# Patient Record
Sex: Male | Born: 1935 | Race: White | Hispanic: No | Marital: Married | Smoking: Never smoker
Health system: Southern US, Community
[De-identification: ages and names within clinical notes are randomized; demographics above are authoritative.]

## PROBLEM LIST (undated history)

## (undated) DIAGNOSIS — I480 Paroxysmal atrial fibrillation: Secondary | ICD-10-CM

## (undated) DIAGNOSIS — K56609 Unspecified intestinal obstruction, unspecified as to partial versus complete obstruction: Secondary | ICD-10-CM

## (undated) DIAGNOSIS — G5600 Carpal tunnel syndrome, unspecified upper limb: Secondary | ICD-10-CM

## (undated) DIAGNOSIS — K819 Cholecystitis, unspecified: Secondary | ICD-10-CM

## (undated) DIAGNOSIS — T4145XA Adverse effect of unspecified anesthetic, initial encounter: Secondary | ICD-10-CM

## (undated) DIAGNOSIS — I10 Essential (primary) hypertension: Secondary | ICD-10-CM

## (undated) DIAGNOSIS — T8859XA Other complications of anesthesia, initial encounter: Secondary | ICD-10-CM

## (undated) DIAGNOSIS — J45909 Unspecified asthma, uncomplicated: Secondary | ICD-10-CM

## (undated) DIAGNOSIS — M1990 Unspecified osteoarthritis, unspecified site: Secondary | ICD-10-CM

## (undated) HISTORY — PX: HERNIA REPAIR: SHX51

## (undated) HISTORY — DX: Paroxysmal atrial fibrillation: I48.0

---

## 2003-11-18 ENCOUNTER — Ambulatory Visit (HOSPITAL_COMMUNITY): Admission: RE | Admit: 2003-11-18 | Discharge: 2003-11-18 | Payer: Self-pay | Admitting: Gastroenterology

## 2003-11-18 ENCOUNTER — Encounter (INDEPENDENT_AMBULATORY_CARE_PROVIDER_SITE_OTHER): Payer: Self-pay | Admitting: *Deleted

## 2009-06-23 ENCOUNTER — Encounter: Admission: RE | Admit: 2009-06-23 | Discharge: 2009-06-23 | Payer: Self-pay | Admitting: General Surgery

## 2009-06-28 ENCOUNTER — Ambulatory Visit (HOSPITAL_BASED_OUTPATIENT_CLINIC_OR_DEPARTMENT_OTHER): Admission: RE | Admit: 2009-06-28 | Discharge: 2009-06-28 | Payer: Self-pay | Admitting: General Surgery

## 2010-11-23 LAB — BASIC METABOLIC PANEL
BUN: 12 mg/dL (ref 6–23)
CO2: 23 mEq/L (ref 19–32)
Calcium: 8.9 mg/dL (ref 8.4–10.5)
Chloride: 104 mEq/L (ref 96–112)
Creatinine, Ser: 0.84 mg/dL (ref 0.4–1.5)
GFR calc Af Amer: >60 mL/min (ref >60)

## 2011-01-06 NOTE — Op Note (Signed)
NAME:  Daniel Yu, Daniel Yu                          ACCOUNT NO.:  [id number]   MEDICAL RECORD NO.:  [id number]                   PATIENT TYPE:  AMB   LOCATION:  ENDO                                 FACILITY:  [facility]   PHYSICIAN:  Danise Edge, M.D.                DATE OF BIRTH:  01-Dec-1931   DATE OF PROCEDURE:  11/18/2003  DATE OF DISCHARGE:                                 OPERATIVE REPORT   REFERRING PHYSICIAN:  Dr. Kirby Funk.   PROCEDURE:  Colonoscopy.   PROCEDURE INDICATION:  Daniel Yu is a 77 year old make, born February 23, 1932.  Daniel Yu is scheduled to undergo his first screening colonoscopy  with polypectomy to prevent colon cancer.   ENDOSCOPIST:  Charolett Bumpers, M.D.   PREMEDICATION:  1. Versed 9 mg.  2. Demerol 50 mg.   DESCRIPTION OF PROCEDURE:  After obtaining informed consent, Daniel Yu was  placed in the left lateral decubitus position.  I administered intravenous  Demerol and intravenous Versed to achieve conscious sedation for the  procedure.  The patient's blood pressure, oxygen saturation, and cardiac  rhythm were monitored throughout the procedure and documented in the medical  record.   Anal inspection was normal.  Digital rectal exam was normal.  The prostate  was nonnodular.  The Olympus adjustable pediatric colonoscope was introduced  into the rectum and advanced to the cecum.  Colonic preparation for the exam  today was excellent.   RECTUM:  Normal.  SIGMOID COLON AND DESCENDING COLON:  At 30 cm from the anal verge, a 2 mm  sessile polyp was removed with the electrocautery snare.  At 40 cm from the  anal verge, a 2 mm sessile polyp was removed with the electrocautery snare.  SPLENIC FLEXURE:  Normal.  TRANSVERSE COLON:  Normal.  HEPATIC FLEXURE:  Normal.  ASCENDING COLON:  Normal.  CECUM AND ILEOCECAL VALVE:  Normal.   ASSESSMENT:  Two small polyps were removed from the sigmoid colon and  submitted in one bottle for pathological  evaluation.   RECOMMENDATIONS:  Repeat colonoscopy in 5 years if polyps return neoplastic  pathologically.                                               Danise Edge, M.D.    MJ/MEDQ  D:  11/18/2003  T:  11/18/2003  Job:  [id number]   cc:   Thora Lance, M.D.  [address]  [place]  [place] [address]  Fax: [phone]

## 2011-12-12 ENCOUNTER — Other Ambulatory Visit: Payer: Self-pay | Admitting: Dermatology

## 2013-05-27 ENCOUNTER — Other Ambulatory Visit: Payer: Self-pay | Admitting: Internal Medicine

## 2013-05-27 ENCOUNTER — Ambulatory Visit
Admission: RE | Admit: 2013-05-27 | Discharge: 2013-05-27 | Disposition: A | Discharge status: Home / Self Care | Payer: Medicare Other | Source: Ambulatory Visit | Attending: Internal Medicine | Admitting: Internal Medicine

## 2013-05-27 DIAGNOSIS — M25552 Pain in left hip: Secondary | ICD-10-CM

## 2014-08-05 ENCOUNTER — Other Ambulatory Visit: Payer: Self-pay | Admitting: Gastroenterology

## 2014-10-20 ENCOUNTER — Other Ambulatory Visit: Payer: Self-pay | Admitting: Gastroenterology

## 2014-11-05 ENCOUNTER — Encounter (HOSPITAL_COMMUNITY): Payer: Self-pay | Admitting: *Deleted

## 2014-11-09 ENCOUNTER — Ambulatory Visit (HOSPITAL_COMMUNITY)
Admission: RE | Admit: 2014-11-09 | Discharge: 2014-11-09 | Disposition: A | Discharge status: Home / Self Care | Payer: 59 | Source: Ambulatory Visit | Attending: Gastroenterology | Admitting: Gastroenterology

## 2014-11-09 ENCOUNTER — Encounter (HOSPITAL_COMMUNITY): Payer: Self-pay | Admitting: *Deleted

## 2014-11-09 ENCOUNTER — Ambulatory Visit (HOSPITAL_COMMUNITY): Payer: 59 | Admitting: Anesthesiology

## 2014-11-09 ENCOUNTER — Encounter (HOSPITAL_COMMUNITY)
Admission: RE | Disposition: A | Discharge status: Home / Self Care | Payer: Self-pay | Source: Ambulatory Visit | Attending: Gastroenterology

## 2014-11-09 DIAGNOSIS — Z09 Encounter for follow-up examination after completed treatment for conditions other than malignant neoplasm: Secondary | ICD-10-CM | POA: Diagnosis not present

## 2014-11-09 DIAGNOSIS — M1990 Unspecified osteoarthritis, unspecified site: Secondary | ICD-10-CM | POA: Insufficient documentation

## 2014-11-09 DIAGNOSIS — J45909 Unspecified asthma, uncomplicated: Secondary | ICD-10-CM | POA: Diagnosis not present

## 2014-11-09 DIAGNOSIS — I10 Essential (primary) hypertension: Secondary | ICD-10-CM | POA: Insufficient documentation

## 2014-11-09 DIAGNOSIS — Z91030 Bee allergy status: Secondary | ICD-10-CM | POA: Insufficient documentation

## 2014-11-09 HISTORY — PX: COLONOSCOPY WITH PROPOFOL: SHX5780

## 2014-11-09 HISTORY — DX: Essential (primary) hypertension: I10

## 2014-11-09 SURGERY — COLONOSCOPY WITH PROPOFOL
Anesthesia: Monitor Anesthesia Care

## 2014-11-09 MED ORDER — PROPOFOL 10 MG/ML IV BOLUS
INTRAVENOUS | Status: AC
  Filled 2014-11-09: qty 20

## 2014-11-09 MED ORDER — PROPOFOL INFUSION 10 MG/ML OPTIME
INTRAVENOUS | Status: DC | PRN
  Administered 2014-11-09: 180 ug/kg/min via INTRAVENOUS

## 2014-11-09 MED ORDER — SODIUM CHLORIDE 0.9 % IV SOLN: INTRAVENOUS | Status: DC

## 2014-11-09 MED ORDER — LACTATED RINGERS IV SOLN
INTRAVENOUS | Status: DC
  Administered 2014-11-09: 09:00:00 via INTRAVENOUS

## 2014-11-09 SURGICAL SUPPLY — 21 items

## 2014-11-09 NOTE — Anesthesia Preprocedure Evaluation (Signed)
Anesthesia Evaluation  Patient identified by MRN, date of birth, ID band Patient awake    Reviewed: Allergy & Precautions, NPO status , Patient's Chart, lab work & pertinent test results  Airway Mallampati: I  TM Distance: >3 FB Neck ROM: Full    Dental   Pulmonary          Cardiovascular hypertension, Pt. on medications     Neuro/Psych    GI/Hepatic   Endo/Other    Renal/GU      Musculoskeletal   Abdominal   Peds  Hematology   Anesthesia Other Findings   Reproductive/Obstetrics                             Anesthesia Physical Anesthesia Plan  ASA: II  Anesthesia Plan: MAC   Post-op Pain Management:    Induction: Intravenous  Airway Management Planned: Natural Airway  Additional Equipment:   Intra-op Plan:   Post-operative Plan:   Informed Consent: I have reviewed the patients History and Physical, chart, labs and discussed the procedure including the risks, benefits and alternatives for the proposed anesthesia with the patient or authorized representative who has indicated his/her understanding and acceptance.     Plan Discussed with: CRNA and Surgeon  Anesthesia Plan Comments:         Anesthesia Quick Evaluation

## 2014-11-09 NOTE — Anesthesia Postprocedure Evaluation (Signed)
Anesthesia Post Note  Patient: Daniel Yu  Procedure(s) Performed: Procedure(s) (LRB): COLONOSCOPY WITH PROPOFOL (N/A)  Anesthesia type: general  Patient location: PACU  Post pain: Pain level controlled  Post assessment: Patient's Cardiovascular Status Stable  Last Vitals:  Filed Vitals:   11/09/14 1040  BP: 125/74  Pulse: 65  Temp: 36.4 C  Resp: 18    Post vital signs: Reviewed and stable  Level of consciousness: sedated  Complications: No apparent anesthesia complications

## 2014-11-09 NOTE — Op Note (Signed)
Procedure: Surveillance colonoscopy. 01/05/2009 colonoscopy with removal of a 3 mm adenomatous ascending colon polyp  Endoscopist: Earle Gell  Premedication: Propofol administered by anesthesia  Procedure: The patient was placed in the left lateral decubitus position. Anal inspection and digital rectal exam were normal. The Pentax pediatric colonoscope was introduced into the rectum and advanced to the cecum. A normal-appearing appendiceal orifice was identified. A normal-appearing ileocecal valve was intubated and the terminal ileum inspected. Colonic preparation for the exam today was good  Rectum. Normal. Retroflexed view of the distal rectum normal  Sigmoid colon and descending colon. Normal  Splenic flexure. Normal  Transverse colon. Normal  Hepatic flexure. Normal  Ascending colon. Normal  Cecum and ileocecal valve. Normal  Terminal ileum. Normal  Assessment: Normal surveillance colonoscopy

## 2014-11-09 NOTE — Transfer of Care (Signed)
Immediate Anesthesia Transfer of Care Note  Patient: Daniel Yu  Procedure(s) Performed: Procedure(s): COLONOSCOPY WITH PROPOFOL (N/A)  Patient Location: PACU  Anesthesia Type:MAC  Level of Consciousness: sedated  Airway & Oxygen Therapy: Patient Spontanous Breathing and Patient connected to nasal cannula oxygen  Post-op Assessment: Report given to RN and Post -op Vital signs reviewed and stable  Post vital signs: Reviewed and stable  Last Vitals:  Filed Vitals:   11/09/14 0840  BP: 143/77  Pulse: 81  Temp: 36.4 C  Resp: 16    Complications: No apparent anesthesia complications

## 2014-11-09 NOTE — Discharge Instructions (Signed)
Colonoscopy, Care After °These instructions give you information on caring for yourself after your procedure. Your doctor may also give you more specific instructions. Call your doctor if you have any problems or questions after your procedure. °HOME CARE °· Do not drive for 24 hours. °· Do not sign important papers or use machinery for 24 hours. °· You may shower. °· You may go back to your usual activities, but go slower for the first 24 hours. °· Take rest breaks often during the first 24 hours. °· Walk around or use warm packs on your belly (abdomen) if you have belly cramping or gas. °· Drink enough fluids to keep your pee (urine) clear or pale yellow. °· Resume your normal diet. Avoid heavy or fried foods. °· Avoid drinking alcohol for 24 hours or as told by your doctor. °· Only take medicines as told by your doctor. °If a tissue sample (biopsy) was taken during the procedure:  °· Do not take aspirin or blood thinners for 7 days, or as told by your doctor. °· Do not drink alcohol for 7 days, or as told by your doctor. °· Eat soft foods for the first 24 hours. °GET HELP IF: °You still have a small amount of blood in your poop (stool) 2-3 days after the procedure. °GET HELP RIGHT AWAY IF: °· You have more than a small amount of blood in your poop. °· You see clumps of tissue (blood clots) in your poop. °· Your belly is puffy (swollen). °· You feel sick to your stomach (nauseous) or throw up (vomit). °· You have a fever. °· You have belly pain that gets worse and medicine does not help. °MAKE SURE YOU: °· Understand these instructions. °· Will watch your condition. °· Will get help right away if you are not doing well or get worse. °Document Released: 09/09/2010 Document Revised: 08/12/2013 Document Reviewed: 04/14/2013 °ExitCare® Patient Information ©2015 [employer]. This information is not intended to replace advice given to you by your health care provider. Make sure you discuss any questions you have with  your health care provider. ° °

## 2014-11-09 NOTE — H&P (Signed)
  Procedure: Surveillance colonoscopy. Colonoscopy with removal of a 3 mm adenomatous ascending colon polyp performed on 01/05/2009  History: The patient is an 81 year old male born 1931-09-19. He is scheduled to undergo a surveillance colonoscopy today.  Past medical history: Hypertension. Asthma. Osteoarthritis of the right knee. Left inguinal hernia repair.  Allergies: Bee sting. Lisinopril caused cough  Exam: The patient is alert and lying comfortably on the endoscopy stretcher. Abdomen is soft and nontender to palpation. Lungs are clear to auscultation. Cardiac exam reveals a regular rhythm.  Plan: Proceed with surveillance colonoscopy

## 2014-11-10 ENCOUNTER — Encounter (HOSPITAL_COMMUNITY): Payer: Self-pay | Admitting: Gastroenterology

## 2015-06-01 ENCOUNTER — Ambulatory Visit: Payer: Self-pay | Admitting: Orthopedic Surgery

## 2015-06-01 NOTE — Progress Notes (Signed)
Needs orders in epic for 10-17 surgery pre op is 10-14 830 thanks

## 2015-06-01 NOTE — Progress Notes (Signed)
Preoperative surgical orders have been place into the Epic hospital system for Daniel Yu on 06/01/2015, 2:26 PM  by Mickel Crow for surgery on 06-07-2015.  Preop Knee orders including IV Tylenol and IV Decadron as long as there are no contraindications to the above medications. Arlee Muslim, PA-C

## 2015-06-01 NOTE — Patient Instructions (Addendum)
Daniel Yu [facility]  06/01/2015   Your procedure is scheduled on: Monday 06/07/2015  Report to [facility] Main  Entrance take [facility]  elevators to 3rd floor to  [place] at  110 PM.  Call this number if you have problems the morning of surgery [phone]   Remember: ONLY 1 PERSON MAY GO WITH YOU TO SHORT STAY TO GET  READY MORNING OF [place].  Do not eat food after Midnight.clear liquids midnight until 1010 am, then nothing by mouth.      Take these medicines the morning of surgery with A SIP OF WATER:  flovent inhaler  DO NOT TAKE ANY DIABETIC MEDICATIONS DAY OF YOUR SURGERY                               You may not have any metal on your body including hair pins and              piercings  Do not wear jewelry, make-up, lotions, powders or perfumes, deodorant             Do not wear nail polish.  Do not shave  48 hours prior to surgery.              Men may shave face and neck.   Do not bring valuables to the hospital. [place].  Contacts, dentures or bridgework may not be worn into surgery.  Leave suitcase in the car. After surgery it may be brought to your room.     Patients discharged the day of surgery will not be allowed to drive home.  Name and phone number of your driver:  Special Instructions: N/A              Please read over the following fact sheets you were given: _____________________________________________________________________             [facility] - Preparing for Surgery Before surgery, you can play an important role.  Because skin is not sterile, your skin needs to be as free of germs as possible.  You can reduce the number of germs on your skin by washing with CHG (chlorahexidine gluconate) soap before surgery.  CHG is an antiseptic cleaner which kills germs and bonds with the skin to continue killing germs even after washing. Please DO NOT use if you have an allergy to  CHG or antibacterial soaps.  If your skin becomes reddened/irritated stop using the CHG and inform your nurse when you arrive at Short Stay. Do not shave (including legs and underarms) for at least 48 hours prior to the first CHG shower.  You may shave your face/neck. Please follow these instructions carefully:  1.  Shower with CHG Soap the night before surgery and the  morning of Surgery.  2.  If you choose to wash your hair, wash your hair first as usual with your  normal  shampoo.  3.  After you shampoo, rinse your hair and body thoroughly to remove the  shampoo.                           4.  Use CHG as you would any other liquid soap.  You  can apply chg directly  to the skin and wash                       Gently with a scrungie or clean washcloth.  5.  Apply the CHG Soap to your body ONLY FROM THE NECK DOWN.   Do not use on face/ open                           Wound or open sores. Avoid contact with eyes, ears mouth and genitals (private parts).                       Wash face,  Genitals (private parts) with your normal soap.             6.  Wash thoroughly, paying special attention to the area where your surgery  will be performed.  7.  Thoroughly rinse your body with warm water from the neck down.  8.  DO NOT shower/wash with your normal soap after using and rinsing off  the CHG Soap.                9.  Pat yourself dry with a clean towel.            10.  Wear clean pajamas.            11.  Place clean sheets on your bed the night of your first shower and do not  sleep with pets. Day of Surgery : Do not apply any lotions/deodorants the morning of surgery.  Please wear clean clothes to the hospital/surgery center.  FAILURE TO FOLLOW THESE INSTRUCTIONS MAY RESULT IN THE CANCELLATION OF YOUR SURGERY PATIENT SIGNATURE_________________________________  NURSE SIGNATURE__________________________________  ________________________________________________________________________   Daniel Yu  An incentive spirometer is a tool that can help keep your lungs clear and active. This tool measures how well you are filling your lungs with each breath. Taking long deep breaths may help reverse or decrease the chance of developing breathing (pulmonary) problems (especially infection) following:  A long period of time when you are unable to move or be active. BEFORE THE PROCEDURE   If the spirometer includes an indicator to show your best effort, your nurse or respiratory therapist will set it to a desired goal.  If possible, sit up straight or lean slightly forward. Try not to slouch.  Hold the incentive spirometer in an upright position. INSTRUCTIONS FOR USE  1. Sit on the edge of your bed if possible, or sit up as far as you can in bed or on a chair. 2. Hold the incentive spirometer in an upright position. 3. Breathe out normally. 4. Place the mouthpiece in your mouth and seal your lips tightly around it. 5. Breathe in slowly and as deeply as possible, raising the piston or the ball toward the top of the column. 6. Hold your breath for 3-5 seconds or for as long as possible. Allow the piston or ball to fall to the bottom of the column. 7. Remove the mouthpiece from your mouth and breathe out normally. 8. Rest for a few seconds and repeat Steps 1 through 7 at least 10 times every 1-2 hours when you are awake. Take your time and take a few normal breaths between deep breaths. 9. The spirometer may include an indicator to show your best effort. Use the indicator as a goal to work toward during  each repetition. 10. After each set of 10 deep breaths, practice coughing to be sure your lungs are clear. If you have an incision (the cut made at the time of surgery), support your incision when coughing by placing a pillow or rolled up towels firmly against it. Once you are able to get out of bed, walk around indoors and cough well. You may stop using the incentive spirometer when  instructed by your caregiver.  RISKS AND COMPLICATIONS  Take your time so you do not get dizzy or light-headed.  If you are in pain, you may need to take or ask for pain medication before doing incentive spirometry. It is harder to take a deep breath if you are having pain. AFTER USE  Rest and breathe slowly and easily.  It can be helpful to keep track of a log of your progress. Your caregiver can provide you with a simple table to help with this. If you are using the spirometer at home, follow these instructions: [place] IF:   You are having difficultly using the spirometer.  You have trouble using the spirometer as often as instructed.  Your pain medication is not giving enough relief while using the spirometer.  You develop fever of 100.5 F (38.1 C) or higher. SEEK IMMEDIATE MEDICAL CARE IF:   You cough up bloody sputum that had not been present before.  You develop fever of 102 F (38.9 C) or greater.  You develop worsening pain at or near the incision site. MAKE SURE YOU:   Understand these instructions.  Will watch your condition.  Will get help right away if you are not doing well or get worse. Document Released: 12/18/2006 Document Revised: 10/30/2011 Document Reviewed: 02/18/2007 [facility] Patient Information 2014 [place], [place].   ________________________________________________________________________

## 2015-06-04 ENCOUNTER — Encounter (HOSPITAL_COMMUNITY): Payer: Self-pay

## 2015-06-04 ENCOUNTER — Encounter (HOSPITAL_COMMUNITY)
Admission: RE | Admit: 2015-06-04 | Discharge: 2015-06-04 | Disposition: A | Discharge status: Home / Self Care | Payer: Medicare Other | Source: Ambulatory Visit | Attending: Orthopedic Surgery | Admitting: Orthopedic Surgery

## 2015-06-04 DIAGNOSIS — Z7951 Long term (current) use of inhaled steroids: Secondary | ICD-10-CM | POA: Diagnosis not present

## 2015-06-04 DIAGNOSIS — S76112A Strain of left quadriceps muscle, fascia and tendon, initial encounter: Secondary | ICD-10-CM | POA: Diagnosis not present

## 2015-06-04 DIAGNOSIS — M179 Osteoarthritis of knee, unspecified: Secondary | ICD-10-CM | POA: Diagnosis not present

## 2015-06-04 DIAGNOSIS — W010XXA Fall on same level from slipping, tripping and stumbling without subsequent striking against object, initial encounter: Secondary | ICD-10-CM | POA: Diagnosis not present

## 2015-06-04 DIAGNOSIS — M25562 Pain in left knee: Secondary | ICD-10-CM | POA: Diagnosis present

## 2015-06-04 DIAGNOSIS — Z79899 Other long term (current) drug therapy: Secondary | ICD-10-CM | POA: Diagnosis not present

## 2015-06-04 DIAGNOSIS — I10 Essential (primary) hypertension: Secondary | ICD-10-CM | POA: Diagnosis not present

## 2015-06-04 DIAGNOSIS — J45909 Unspecified asthma, uncomplicated: Secondary | ICD-10-CM | POA: Diagnosis not present

## 2015-06-04 HISTORY — DX: Unspecified asthma, uncomplicated: J45.909

## 2015-06-04 HISTORY — DX: Unspecified osteoarthritis, unspecified site: M19.90

## 2015-06-04 LAB — CBC
HEMATOCRIT: 43.9 % (ref 39.0–52.0)
Hemoglobin: 15.3 g/dL (ref 13.0–17.0)
MCH: 33.8 pg (ref 26.0–34.0)
MCHC: 34.9 g/dL (ref 30.0–36.0)
MCV: 97.1 fL (ref 78.0–100.0)
Platelets: 342 10*3/uL (ref 150–400)
RBC: 4.52 MIL/uL (ref 4.22–5.81)
RDW: 12.6 % (ref 11.5–15.5)
WBC: 7.8 10*3/uL (ref 4.0–10.5)

## 2015-06-04 LAB — BASIC METABOLIC PANEL
Anion gap: 10 (ref 5–15)
BUN: 14 mg/dL (ref 6–20)
CALCIUM: 8.8 mg/dL — AB (ref 8.9–10.3)
CO2: 24 mmol/L (ref 22–32)
CREATININE: 0.63 mg/dL (ref 0.61–1.24)
Chloride: 98 mmol/L — ABNORMAL LOW (ref 101–111)
GFR calc Af Amer: >60 mL/min (ref >60)
GFR calc non Af Amer: >60 mL/min (ref >60)
GLUCOSE: 105 mg/dL — AB (ref 65–99)
Potassium: 4.3 mmol/L (ref 3.5–5.1)
Sodium: 132 mmol/L — ABNORMAL LOW (ref 135–145)

## 2015-06-07 ENCOUNTER — Observation Stay (HOSPITAL_COMMUNITY)
Admission: RE | Admit: 2015-06-07 | Discharge: 2015-06-08 | Disposition: A | Discharge status: Home / Self Care | Payer: Medicare Other | Source: Ambulatory Visit | Attending: Orthopedic Surgery | Admitting: Orthopedic Surgery

## 2015-06-07 ENCOUNTER — Encounter (HOSPITAL_COMMUNITY): Payer: Self-pay | Admitting: *Deleted

## 2015-06-07 ENCOUNTER — Ambulatory Visit (HOSPITAL_COMMUNITY): Payer: Medicare Other | Admitting: Anesthesiology

## 2015-06-07 ENCOUNTER — Encounter (HOSPITAL_COMMUNITY)
Admission: RE | Disposition: A | Discharge status: Home / Self Care | Payer: Self-pay | Source: Ambulatory Visit | Attending: Orthopedic Surgery

## 2015-06-07 DIAGNOSIS — Z7951 Long term (current) use of inhaled steroids: Secondary | ICD-10-CM | POA: Insufficient documentation

## 2015-06-07 DIAGNOSIS — S76112A Strain of left quadriceps muscle, fascia and tendon, initial encounter: Secondary | ICD-10-CM | POA: Diagnosis not present

## 2015-06-07 DIAGNOSIS — S76119A Strain of unspecified quadriceps muscle, fascia and tendon, initial encounter: Secondary | ICD-10-CM | POA: Diagnosis present

## 2015-06-07 DIAGNOSIS — W010XXA Fall on same level from slipping, tripping and stumbling without subsequent striking against object, initial encounter: Secondary | ICD-10-CM | POA: Insufficient documentation

## 2015-06-07 DIAGNOSIS — I10 Essential (primary) hypertension: Secondary | ICD-10-CM | POA: Insufficient documentation

## 2015-06-07 DIAGNOSIS — M179 Osteoarthritis of knee, unspecified: Secondary | ICD-10-CM | POA: Diagnosis not present

## 2015-06-07 DIAGNOSIS — J45909 Unspecified asthma, uncomplicated: Secondary | ICD-10-CM | POA: Insufficient documentation

## 2015-06-07 DIAGNOSIS — Z79899 Other long term (current) drug therapy: Secondary | ICD-10-CM | POA: Insufficient documentation

## 2015-06-07 HISTORY — PX: QUADRICEPS TENDON REPAIR: SHX756

## 2015-06-07 SURGERY — REPAIR, TENDON, QUADRICEPS
Anesthesia: General | Site: Knee | Laterality: Left

## 2015-06-07 MED ORDER — CEFAZOLIN SODIUM-DEXTROSE 2-3 GM-% IV SOLR
2.0000 g | INTRAVENOUS | Status: AC
  Administered 2015-06-07: 2 g via INTRAVENOUS

## 2015-06-07 MED ORDER — BUPIVACAINE HCL (PF) 0.25 % IJ SOLN
INTRAMUSCULAR | Status: AC
  Filled 2015-06-07: qty 30

## 2015-06-07 MED ORDER — ACETAMINOPHEN 325 MG PO TABS: 650.0000 mg | ORAL_TABLET | Freq: Four times a day (QID) | ORAL | Status: DC | PRN

## 2015-06-07 MED ORDER — BUDESONIDE 0.25 MG/2ML IN SUSP
0.2500 mg | Freq: Two times a day (BID) | RESPIRATORY_TRACT | Status: DC
  Administered 2015-06-08: 0.25 mg via RESPIRATORY_TRACT
  Filled 2015-06-07: qty 2

## 2015-06-07 MED ORDER — PROPOFOL 10 MG/ML IV BOLUS
INTRAVENOUS | Status: AC
  Filled 2015-06-07: qty 20

## 2015-06-07 MED ORDER — TRAMADOL HCL 50 MG PO TABS: 50.0000 mg | ORAL_TABLET | Freq: Four times a day (QID) | ORAL | Status: DC | PRN

## 2015-06-07 MED ORDER — ONDANSETRON HCL 4 MG/2ML IJ SOLN: 4.0000 mg | Freq: Four times a day (QID) | INTRAMUSCULAR | Status: DC | PRN

## 2015-06-07 MED ORDER — BUPIVACAINE HCL (PF) 0.25 % IJ SOLN
INTRAMUSCULAR | Status: DC | PRN
Start: 2015-06-07 — End: 2015-06-07
  Administered 2015-06-07: 30 mL

## 2015-06-07 MED ORDER — CEFAZOLIN SODIUM-DEXTROSE 2-3 GM-% IV SOLR
2.0000 g | Freq: Four times a day (QID) | INTRAVENOUS | Status: AC
  Administered 2015-06-07 – 2015-06-08 (×3): 2 g via INTRAVENOUS
  Filled 2015-06-07 (×3): qty 50

## 2015-06-07 MED ORDER — LIDOCAINE HCL (CARDIAC) 10 MG/ML IV SOLN
INTRAVENOUS | Status: DC | PRN
  Administered 2015-06-07: 80 mg via INTRAVENOUS

## 2015-06-07 MED ORDER — HYDROCHLOROTHIAZIDE 12.5 MG PO CAPS
12.5000 mg | ORAL_CAPSULE | Freq: Every day | ORAL | Status: DC
  Filled 2015-06-07: qty 1

## 2015-06-07 MED ORDER — ACETAMINOPHEN 500 MG PO TABS
1000.0000 mg | ORAL_TABLET | Freq: Four times a day (QID) | ORAL | Status: DC
  Administered 2015-06-07 – 2015-06-08 (×2): 1000 mg via ORAL
  Filled 2015-06-07 (×4): qty 2

## 2015-06-07 MED ORDER — MORPHINE SULFATE (PF) 2 MG/ML IV SOLN: 1.0000 mg | INTRAVENOUS | Status: DC | PRN

## 2015-06-07 MED ORDER — PROPOFOL 10 MG/ML IV BOLUS
INTRAVENOUS | Status: DC | PRN
  Administered 2015-06-07: 50 mg via INTRAVENOUS
  Administered 2015-06-07: 200 mg via INTRAVENOUS
  Administered 2015-06-07: 50 mg via INTRAVENOUS

## 2015-06-07 MED ORDER — SODIUM CHLORIDE 0.9 % IV SOLN: INTRAVENOUS | Status: DC

## 2015-06-07 MED ORDER — LACTATED RINGERS IV SOLN
INTRAVENOUS | Status: DC
  Administered 2015-06-07: 1000 mL via INTRAVENOUS
  Administered 2015-06-07: 18:00:00 via INTRAVENOUS

## 2015-06-07 MED ORDER — SODIUM CHLORIDE 0.9 % IJ SOLN
INTRAMUSCULAR | Status: AC
  Filled 2015-06-07: qty 10

## 2015-06-07 MED ORDER — FENTANYL CITRATE (PF) 250 MCG/5ML IJ SOLN
INTRAMUSCULAR | Status: AC
  Filled 2015-06-07: qty 5

## 2015-06-07 MED ORDER — OXYCODONE HCL 5 MG PO TABS
5.0000 mg | ORAL_TABLET | ORAL | Status: DC | PRN
  Administered 2015-06-07 – 2015-06-08 (×2): 10 mg via ORAL
  Filled 2015-06-07 (×3): qty 2

## 2015-06-07 MED ORDER — IRBESARTAN 150 MG PO TABS
150.0000 mg | ORAL_TABLET | Freq: Every day | ORAL | Status: DC
  Filled 2015-06-07: qty 1

## 2015-06-07 MED ORDER — METOCLOPRAMIDE HCL 5 MG/ML IJ SOLN: 5.0000 mg | Freq: Three times a day (TID) | INTRAMUSCULAR | Status: DC | PRN

## 2015-06-07 MED ORDER — FENTANYL CITRATE (PF) 100 MCG/2ML IJ SOLN
INTRAMUSCULAR | Status: DC | PRN
  Administered 2015-06-07 (×5): 50 ug via INTRAVENOUS

## 2015-06-07 MED ORDER — HYDROMORPHONE HCL 1 MG/ML IJ SOLN
INTRAMUSCULAR | Status: DC | PRN
  Administered 2015-06-07: .4 mg via INTRAVENOUS
  Administered 2015-06-07 (×4): .2 mg via INTRAVENOUS
  Administered 2015-06-07 (×2): .4 mg via INTRAVENOUS

## 2015-06-07 MED ORDER — ACETAMINOPHEN 10 MG/ML IV SOLN
1000.0000 mg | Freq: Once | INTRAVENOUS | Status: AC
  Administered 2015-06-07: 1000 mg via INTRAVENOUS

## 2015-06-07 MED ORDER — FENTANYL CITRATE (PF) 250 MCG/5ML IJ SOLN
INTRAMUSCULAR | Status: AC
  Filled 2015-06-07: qty 25

## 2015-06-07 MED ORDER — DEXAMETHASONE SODIUM PHOSPHATE 10 MG/ML IJ SOLN
10.0000 mg | Freq: Once | INTRAMUSCULAR | Status: AC
  Administered 2015-06-07: 10 mg via INTRAVENOUS

## 2015-06-07 MED ORDER — ONDANSETRON HCL 4 MG/2ML IJ SOLN
INTRAMUSCULAR | Status: AC
  Filled 2015-06-07: qty 2

## 2015-06-07 MED ORDER — VALSARTAN-HYDROCHLOROTHIAZIDE 160-12.5 MG PO TABS: 1.0000 | ORAL_TABLET | Freq: Every morning | ORAL | Status: DC

## 2015-06-07 MED ORDER — MIDAZOLAM HCL 2 MG/2ML IJ SOLN
INTRAMUSCULAR | Status: AC
  Filled 2015-06-07: qty 4

## 2015-06-07 MED ORDER — MIDAZOLAM HCL 5 MG/5ML IJ SOLN
INTRAMUSCULAR | Status: DC | PRN
  Administered 2015-06-07: 1 mg via INTRAVENOUS

## 2015-06-07 MED ORDER — METHOCARBAMOL 500 MG PO TABS: 500.0000 mg | ORAL_TABLET | Freq: Four times a day (QID) | ORAL | Status: DC | PRN

## 2015-06-07 MED ORDER — ACETAMINOPHEN 650 MG RE SUPP: 650.0000 mg | Freq: Four times a day (QID) | RECTAL | Status: DC | PRN

## 2015-06-07 MED ORDER — CEFAZOLIN SODIUM-DEXTROSE 2-3 GM-% IV SOLR
INTRAVENOUS | Status: AC
  Filled 2015-06-07: qty 50

## 2015-06-07 MED ORDER — HYDROMORPHONE HCL 2 MG/ML IJ SOLN
INTRAMUSCULAR | Status: AC
  Filled 2015-06-07: qty 1

## 2015-06-07 MED ORDER — PHENYLEPHRINE HCL 10 MG/ML IJ SOLN
INTRAMUSCULAR | Status: DC | PRN
  Administered 2015-06-07 (×5): 80 ug via INTRAVENOUS

## 2015-06-07 MED ORDER — METOCLOPRAMIDE HCL 10 MG PO TABS: 5.0000 mg | ORAL_TABLET | Freq: Three times a day (TID) | ORAL | Status: DC | PRN

## 2015-06-07 MED ORDER — LIDOCAINE HCL (CARDIAC) 20 MG/ML IV SOLN
INTRAVENOUS | Status: AC
  Filled 2015-06-07: qty 5

## 2015-06-07 MED ORDER — SODIUM CHLORIDE 0.9 % IV SOLN
INTRAVENOUS | Status: DC
  Administered 2015-06-07: 19:00:00 via INTRAVENOUS

## 2015-06-07 MED ORDER — ACETAMINOPHEN 10 MG/ML IV SOLN
INTRAVENOUS | Status: AC
  Filled 2015-06-07: qty 100

## 2015-06-07 MED ORDER — CHLORHEXIDINE GLUCONATE 4 % EX LIQD: 60.0000 mL | Freq: Once | CUTANEOUS | Status: DC

## 2015-06-07 MED ORDER — ENOXAPARIN SODIUM 40 MG/0.4ML ~~LOC~~ SOLN
40.0000 mg | SUBCUTANEOUS | Status: DC
  Administered 2015-06-08: 40 mg via SUBCUTANEOUS
  Filled 2015-06-07 (×2): qty 0.4

## 2015-06-07 MED ORDER — DEXTROSE 5 % IV SOLN
500.0000 mg | Freq: Four times a day (QID) | INTRAVENOUS | Status: DC | PRN
  Filled 2015-06-07: qty 5

## 2015-06-07 MED ORDER — ONDANSETRON HCL 4 MG PO TABS
4.0000 mg | ORAL_TABLET | Freq: Four times a day (QID) | ORAL | Status: DC | PRN
Start: 2015-06-07 — End: 2015-06-08

## 2015-06-07 MED ORDER — ONDANSETRON HCL 4 MG/2ML IJ SOLN
INTRAMUSCULAR | Status: DC | PRN
  Administered 2015-06-07: 4 mg via INTRAVENOUS

## 2015-06-07 MED ORDER — FENTANYL CITRATE (PF) 100 MCG/2ML IJ SOLN: 25.0000 ug | INTRAMUSCULAR | Status: DC | PRN

## 2015-06-07 MED ORDER — DEXAMETHASONE SODIUM PHOSPHATE 10 MG/ML IJ SOLN
INTRAMUSCULAR | Status: AC
  Filled 2015-06-07: qty 1

## 2015-06-07 MED ORDER — PHENYLEPHRINE 40 MCG/ML (10ML) SYRINGE FOR IV PUSH (FOR BLOOD PRESSURE SUPPORT)
PREFILLED_SYRINGE | INTRAVENOUS | Status: AC
  Filled 2015-06-07: qty 10

## 2015-06-07 SURGICAL SUPPLY — 50 items
BAG ZIPLOCK 12X15 (MISCELLANEOUS) ×3 IMPLANT
BANDAGE ELASTIC 6 VELCRO ST LF (GAUZE/BANDAGES/DRESSINGS) ×3 IMPLANT
BANDAGE ESMARK 6X9 LF (GAUZE/BANDAGES/DRESSINGS) ×1 IMPLANT
BIT DRILL 2.8X5 QR DISP (BIT) ×3 IMPLANT
BLADE MIC 41X13 (BLADE) ×2 IMPLANT
BLADE MIC 41X13MM (BLADE) ×1
BNDG ESMARK 6X9 LF (GAUZE/BANDAGES/DRESSINGS) ×3
CLOSURE WOUND 1/2 X4 (GAUZE/BANDAGES/DRESSINGS) ×1
DRAPE EXTREMITY T 121X128X90 (DRAPE) ×3 IMPLANT
DRAPE POUCH INSTRU U-SHP 10X18 (DRAPES) ×3 IMPLANT
DRAPE U-SHAPE 47X51 STRL (DRAPES) ×3 IMPLANT
DRSG ADAPTIC 3X8 NADH LF (GAUZE/BANDAGES/DRESSINGS) ×3 IMPLANT
DRSG EMULSION OIL 3X3 NADH (GAUZE/BANDAGES/DRESSINGS) ×3 IMPLANT
DRSG PAD ABDOMINAL 8X10 ST (GAUZE/BANDAGES/DRESSINGS) ×3 IMPLANT
DURAPREP 26ML APPLICATOR (WOUND CARE) ×3 IMPLANT
ELECT REM PT RETURN 9FT ADLT (ELECTROSURGICAL) ×3
ELECTRODE REM PT RTRN 9FT ADLT (ELECTROSURGICAL) ×1 IMPLANT
EVACUATOR 1/8 PVC DRAIN (DRAIN) ×3 IMPLANT
GAUZE SPONGE 4X4 12PLY STRL (GAUZE/BANDAGES/DRESSINGS) ×3 IMPLANT
GLOVE BIO SURGEON STRL SZ7.5 (GLOVE) ×3 IMPLANT
GLOVE BIO SURGEON STRL SZ8 (GLOVE) ×3 IMPLANT
GLOVE BIOGEL PI IND STRL 8 (GLOVE) ×2 IMPLANT
GLOVE BIOGEL PI INDICATOR 8 (GLOVE) ×4
GOWN STRL REUS W/TWL LRG LVL3 (GOWN DISPOSABLE) ×3 IMPLANT
GOWN STRL REUS W/TWL XL LVL3 (GOWN DISPOSABLE) ×3 IMPLANT
IMMOBILIZER KNEE 20 (SOFTGOODS) ×3
IMMOBILIZER KNEE 20 THIGH 36 (SOFTGOODS) ×1 IMPLANT
KIT BASIN OR (CUSTOM PROCEDURE TRAY) ×3 IMPLANT
MANIFOLD NEPTUNE II (INSTRUMENTS) ×3 IMPLANT
NDL SAFETY ECLIPSE 18X1.5 (NEEDLE) ×1 IMPLANT
NEEDLE HYPO 18GX1.5 SHARP (NEEDLE) ×2
NEEDLE MA TROC 1/2 CIR (NEEDLE) ×3 IMPLANT
NS IRRIG 1000ML POUR BTL (IV SOLUTION) ×3 IMPLANT
PACK ICE MAXI GEL EZY WRAP (MISCELLANEOUS) ×3 IMPLANT
PACK TOTAL JOINT (CUSTOM PROCEDURE TRAY) ×3 IMPLANT
PADDING CAST COTTON 6X4 STRL (CAST SUPPLIES) ×3 IMPLANT
PASSER SUT SWANSON 36MM LOOP (INSTRUMENTS) ×3 IMPLANT
POSITIONER SURGICAL ARM (MISCELLANEOUS) ×3 IMPLANT
STRIP CLOSURE SKIN 1/2X4 (GAUZE/BANDAGES/DRESSINGS) ×2 IMPLANT
SUT ETHIBOND 2 OS 4 DA (SUTURE) ×3 IMPLANT
SUT FIBERWIRE #2 38 T-5 BLUE (SUTURE) ×6
SUT MNCRL AB 4-0 PS2 18 (SUTURE) ×3 IMPLANT
SUT VIC AB 0 CT1 27 (SUTURE)
SUT VIC AB 0 CT1 27XBRD ANTBC (SUTURE) IMPLANT
SUT VIC AB 2-0 CT1 27 (SUTURE) ×6
SUT VIC AB 2-0 CT1 TAPERPNT 27 (SUTURE) ×3 IMPLANT
SUTURE FIBERWR #2 38 T-5 BLUE (SUTURE) ×2 IMPLANT
SYR 30ML LL (SYRINGE) ×3 IMPLANT
TOWEL OR 17X26 10 PK STRL BLUE (TOWEL DISPOSABLE) ×3 IMPLANT
WRAP KNEE MAXI GEL POST OP (GAUZE/BANDAGES/DRESSINGS) ×3 IMPLANT

## 2015-06-07 NOTE — H&P (Signed)
  CC- Daniel Yu is a 81 y.o. male who presents with left knee pain.  HPI- . Knee Pain: Patient presents with knee pain involving the  left knee. Onset of the symptoms was 2 weeks ago. Inciting event: slipped and fell. Current symptoms include giving out and pain located anteriorly. Pain is aggravated by standing and walking.  Patient has had no prior knee problems. Evaluation to date: plain films: normal. MRI with quadriceps tendon tear.Treatment to date: knee imobilizer.  Past Medical History  Diagnosis Date  . Hypertension   . Asthma   . Arthritis     right knee oa    Past Surgical History  Procedure Laterality Date  . Colonoscopy with propofol N/A 11/09/2014    Procedure: COLONOSCOPY WITH PROPOFOL;  Surgeon: Garlan Fair, MD;  Location: [facility] ENDOSCOPY;  Service: Endoscopy;  Laterality: N/A;    Prior to Admission medications   Medication Sig Start Date End Date Taking? Authorizing Provider  Budesonide 90 MCG/ACT inhaler Inhale 1 puff into the lungs 2 (two) times daily.   Yes Historical Provider, MD  valsartan-hydrochlorothiazide (DIOVAN-HCT) 160-12.5 MG per tablet Take 1 tablet by mouth every morning.   Yes Historical Provider, MD    antalgic gait, soft tissue tenderness over anterior knee, no effusion, unable to extend knee with palpable defect anteriorly  Physical Examination: General appearance - alert, well appearing, and in no distress Mental status - alert, oriented to person, place, and time Chest - clear to auscultation, no wheezes, rales or rhonchi, symmetric air entry Heart - normal rate, regular rhythm, normal S1, S2, no murmurs, rubs, clicks or gallops Abdomen - soft, nontender, nondistended, no masses or organomegaly Neurological - alert, oriented, normal speech, no focal findings or movement disorder noted   Asessment/Plan--- Left knee quad tendon tear- - Plan left knee quadriceps tendon repair. Procedure risks and potential comps discussed with patient who  elects to proceed. Goals are decreased pain and increased function with a high likelihood of achieving both

## 2015-06-07 NOTE — Brief Op Note (Signed)
06/07/2015  5:45 PM  PATIENT:  Daniel Yu  81 y.o. male  PRE-OPERATIVE DIAGNOSIS:  left quad tendon tear  POST-OPERATIVE DIAGNOSIS:  left quad tendon tear  PROCEDURE:  Procedure(s): REPAIR LEFT QUADRICEP TENDON (Left)  SURGEON:  Surgeon(s) and Role:    * Gaynelle Arabian, MD - Primary  PHYSICIAN ASSISTANT:   ASSISTANTS: Arlee Muslim, PA-C   ANESTHESIA:   general  EBL:     BLOOD ADMINISTERED:none  DRAINS: (Medium) Hemovact drain(s) in the left knee with  Suction Open   LOCAL MEDICATIONS USED:  MARCAINE     COUNTS:  YES  TOURNIQUET:   Total Tourniquet Time Documented: Thigh (Left) - 34 minutes Total: Thigh (Left) - 34 minutes   DICTATION: .Other Dictation: Dictation Number [id number]  PLAN OF CARE: Admit for overnight observation  PATIENT DISPOSITION:  PACU - hemodynamically stable.

## 2015-06-07 NOTE — Interval H&P Note (Signed)
History and Physical Interval Note:  06/07/2015 4:06 PM  Daniel Yu  has presented today for surgery, with the diagnosis of left quad tendon tear  The various methods of treatment have been discussed with the patient and family. After consideration of risks, benefits and other options for treatment, the patient has consented to  Procedure(s): REPAIR LEFT QUADRICEP TENDON (Left) as a surgical intervention .  The patient's history has been reviewed, patient examined, no change in status, stable for surgery.  I have reviewed the patient's chart and labs.  Questions were answered to the patient's satisfaction.     Gearlean Alf

## 2015-06-07 NOTE — Transfer of Care (Signed)
Immediate Anesthesia Transfer of Care Note  Patient: Daniel Yu  Procedure(s) Performed: Procedure(s): REPAIR LEFT QUADRICEP TENDON (Left)  Patient Location: PACU  Anesthesia Type:General  Level of Consciousness: awake, alert , oriented and patient cooperative  Airway & Oxygen Therapy: Patient Spontanous Breathing and Patient connected to face mask oxygen  Post-op Assessment: Report given to RN, Post -op Vital signs reviewed and stable and Patient moving all extremities X 4  Post vital signs: stable  Last Vitals:  Filed Vitals:   06/07/15 1810  BP: 147/83  Pulse: 93  Temp: 36.9 C  Resp: 15    Complications: No apparent anesthesia complications

## 2015-06-07 NOTE — Anesthesia Preprocedure Evaluation (Addendum)
Anesthesia Evaluation  Patient identified by MRN, date of birth, ID band Patient awake    Reviewed: Allergy & Precautions, H&P , NPO status , Patient's Chart, lab work & pertinent test results  Airway Mallampati: I  TM Distance: >3 FB Neck ROM: Full    Dental  (+) Dental Advisory Given, Caps All upper teeth are capped:   Pulmonary asthma ,    Pulmonary exam normal breath sounds clear to auscultation       Cardiovascular Exercise Tolerance: Good hypertension, Pt. on medications Normal cardiovascular exam Rhythm:regular Rate:Normal     Neuro/Psych negative neurological ROS  negative psych ROS   GI/Hepatic negative GI ROS, Neg liver ROS,   Endo/Other  negative endocrine ROS  Renal/GU negative Renal ROS  negative genitourinary   Musculoskeletal   Abdominal   Peds  Hematology negative hematology ROS (+)   Anesthesia Other Findings   Reproductive/Obstetrics negative OB ROS                          Anesthesia Physical Anesthesia Plan  ASA: II  Anesthesia Plan: General   Post-op Pain Management:    Induction: Intravenous  Airway Management Planned: LMA  Additional Equipment:   Intra-op Plan:   Post-operative Plan:   Informed Consent: I have reviewed the patients History and Physical, chart, labs and discussed the procedure including the risks, benefits and alternatives for the proposed anesthesia with the patient or authorized representative who has indicated his/her understanding and acceptance.   Dental Advisory Given  Plan Discussed with: Surgeon  Anesthesia Plan Comments:        Anesthesia Quick Evaluation

## 2015-06-07 NOTE — Anesthesia Postprocedure Evaluation (Signed)
  Anesthesia Post-op Note  Patient: Daniel Yu  Procedure(s) Performed: Procedure(s) (LRB): REPAIR LEFT QUADRICEP TENDON (Left)  Patient Location: PACU  Anesthesia Type: General  Level of Consciousness: awake and alert   Airway and Oxygen Therapy: Patient Spontanous Breathing  Post-op Pain: mild  Post-op Assessment: Post-op Vital signs reviewed, Patient's Cardiovascular Status Stable, Respiratory Function Stable, Patent Airway and No signs of Nausea or vomiting  Last Vitals:  Filed Vitals:   06/07/15 2036  BP: 141/74  Pulse: 84  Temp: 36.7 C  Resp: 16    Post-op Vital Signs: stable   Complications: No apparent anesthesia complications

## 2015-06-07 NOTE — Anesthesia Procedure Notes (Signed)
Procedure Name: LMA Insertion Date/Time: 06/07/2015 4:50 PM Performed by: Enrigue Catena E Pre-anesthesia Checklist: Patient identified, Patient being monitored, Suction available and Emergency Drugs available Patient Re-evaluated:Patient Re-evaluated prior to inductionOxygen Delivery Method: Circle system utilized Preoxygenation: Pre-oxygenation with 100% oxygen Intubation Type: IV induction LMA: LMA inserted LMA Size: 5.0 Number of attempts: 1 Placement Confirmation: positive ETCO2 Tube secured with: Tape Dental Injury: Teeth and Oropharynx as per pre-operative assessment

## 2015-06-08 ENCOUNTER — Encounter (HOSPITAL_COMMUNITY): Payer: Self-pay | Admitting: Orthopedic Surgery

## 2015-06-08 DIAGNOSIS — S76112A Strain of left quadriceps muscle, fascia and tendon, initial encounter: Secondary | ICD-10-CM | POA: Diagnosis not present

## 2015-06-08 MED ORDER — TRAMADOL HCL 50 MG PO TABS: 50.0000 mg | ORAL_TABLET | Freq: Four times a day (QID) | ORAL | Status: DC | PRN

## 2015-06-08 MED ORDER — ASCRIPTIN 325 MG PO TABS: 325.0000 mg | ORAL_TABLET | Freq: Every day | ORAL | Status: DC

## 2015-06-08 MED ORDER — METHOCARBAMOL 500 MG PO TABS: 500.0000 mg | ORAL_TABLET | Freq: Four times a day (QID) | ORAL | Status: DC | PRN

## 2015-06-08 MED ORDER — OXYCODONE HCL 5 MG PO TABS: 5.0000 mg | ORAL_TABLET | ORAL | Status: DC | PRN

## 2015-06-08 NOTE — Progress Notes (Signed)
   Subjective: 1 Day Post-Op Procedure(s) (LRB): REPAIR LEFT QUADRICEP TENDON (Left) Patient reports pain as mild.   Patient seen in rounds with Dr. Wynelle Link. Patient is well, but has had some minor complaints of pain in the knee, requiring pain medications Patient is ready to go today following therapy for ambulation. Knee immobilizer at all times.  Do not remove brace except for showering. NO BENDING OR MOTION FOR TWO WEEKS.  Objective: Vital signs in last 24 hours: Temp:  [97.7 F (36.5 C)-98.7 F (37.1 C)] 97.7 F (36.5 C) (10/18 0659) Pulse Rate:  [77-93] 77 (10/18 0659) Resp:  [13-20] 16 (10/18 0659) BP: (128-157)/(60-83) 128/63 mmHg (10/18 0659) SpO2:  [94 %-100 %] 98 % (10/18 0659) Weight:  [85.73 kg (189 lb)] 85.73 kg (189 lb) (10/17 1305)  Intake/Output from previous day:  Intake/Output Summary (Last 24 hours) at 06/08/15 0832 Last data filed at 06/08/15 0659  Gross per 24 hour  Intake   3720 ml  Output    250 ml  Net   3470 ml    EXAM: General - Patient is Alert and Appropriate Extremity - Neurovascular intact Sensation intact distally Dorsiflexion/Plantar flexion intact Dressing - clean, dry, no drainage Motor Function - intact, moving foot and toes well on exam.   Assessment/Plan: 1 Day Post-Op Procedure(s) (LRB): REPAIR LEFT QUADRICEP TENDON (Left) Procedure(s) (LRB): REPAIR LEFT QUADRICEP TENDON (Left) Past Medical History  Diagnosis Date  . Hypertension   . Asthma   . Arthritis     right knee oa   Principal Problem:   Rupture of left quadriceps tendon Active Problems:   Quadriceps tendon rupture  Estimated body mass index is 26 kg/(m^2) as calculated from the following:   Height as of this encounter: 5' 11.5" (1.816 m).   Weight as of this encounter: 85.73 kg (189 lb). Up with therapy Discharge home (does NOT need home health) Diet - Cardiac diet Follow up - in two weeks. Call office for appointment at [phone]. Activity - Weight bearing  as tolerated to the surgical leg.    Knee immobilizer at all times.  Do not remove brace except for showering. NO BENDING OR MOTION TO THE LEFT KNEE FOR TWO WEEKS. May start showering three days following surgery but do not submerge incision under water. Continue to use ice for pain and swelling from surgery.  Full dose aspirin 325 mg daily for three weeks. Please use walker for the first couple of days until comfortable ambulating.  May start changing dressing tomorrow with dry gauze and tape.  Disposition - Home Condition Upon Discharge - Stable D/C Meds - See DC Summary DVT Prophylaxis - Aspirin 325 mg daily  Arlee Muslim, PA-C Orthopaedic Surgery 06/08/2015, 8:32 AM

## 2015-06-08 NOTE — Evaluation (Signed)
Physical Therapy Evaluation Patient Details Name: Daniel Yu MRN: [id number] DOB: June 04, 1932 Today's Date: 06/08/2015   History of Present Illness  s/p L quad tendon repair  Clinical Impression  Patient evaluated by Physical Therapy with no further acute PT needs identified. All education has been completed and the patient has no further questions.  See below for any follow-up Physial Therapy or equipment needs. PT is signing off. Thank you for this referral.     Follow Up Recommendations Home health PT    Equipment Recommendations  None recommended by PT    Recommendations for Other Services       Precautions / Restrictions Precautions Precaution Comments: NO ROM L knee Required Braces or Orthoses: Knee Immobilizer - Left Restrictions Other Position/Activity Restrictions: WBAT      Mobility  Bed Mobility Overal bed mobility: Needs Assistance Bed Mobility: Supine to Sit     Supine to sit: Supervision     General bed mobility comments: for lines and safety  Transfers Overall transfer level: Needs assistance Equipment used: Rolling walker (2 wheeled) Transfers: Sit to/from Stand Sit to Stand: Min guard         General transfer comment: cues for hand placement  Ambulation/Gait Ambulation/Gait assistance: Min guard;Supervision Ambulation Distance (Feet): 180 Feet Assistive device: Rolling walker (2 wheeled) Gait Pattern/deviations: Step-to pattern;Antalgic;Trunk flexed     General Gait Details: cues for posture, RW distance from self  Stairs Stairs: Yes Stairs assistance: Min guard Stair Management: No rails;One rail Right;One rail Left;Step to pattern;Forwards;With crutches Number of Stairs: 8 General stair comments: cues for sequence, safety and technique  Wheelchair Mobility    Modified Rankin (Stroke Patients Only)       Balance Overall balance assessment: Needs assistance Sitting-balance support: Feet supported;No upper extremity  supported Sitting balance-Leahy Scale: Fair     Standing balance support: Bilateral upper extremity supported;No upper extremity supported Standing balance-Leahy Scale: Fair                               Pertinent Vitals/Pain Pain Assessment: 0-10 Pain Score: 5  Pain Location: L knee  Pain Intervention(s): Limited activity within patient's tolerance;Monitored during session;Premedicated before session;Ice applied;Repositioned    Home Living Family/patient expects to be discharged to:: Private residence Living Arrangements: Spouse/significant other Available Help at Discharge: Family;Available 24 hours/day Type of Home: House Home Access: Stairs to enter Entrance Stairs-Rails: None Entrance Stairs-Number of Steps: 4 Home Layout: Two level;1/2 bath on main level Home Equipment: Crutches;Walker - 2 wheels;Toilet riser      Prior Function Level of Independence: Independent               Hand Dominance        Extremity/Trunk Assessment   Upper Extremity Assessment: Overall WFL for tasks assessed           Lower Extremity Assessment: LLE deficits/detail   LLE Deficits / Details: ankle WFL     Communication   Communication: No difficulties  Cognition Arousal/Alertness: Awake/alert Behavior During Therapy: WFL for tasks assessed/performed Overall Cognitive Status: Within Functional Limits for tasks assessed                      General Comments      Exercises        Assessment/Plan    PT Assessment All further PT needs can be met in the next venue of care  PT Diagnosis Difficulty walking  PT Problem List    PT Treatment Interventions     PT Goals (Current goals can be found in the Care Plan section) Acute Rehab PT Goals Patient Stated Goal: be able to get upstairs when ok with MD and PT PT Goal Formulation: All assessment and education complete, DC therapy    Frequency     Barriers to discharge        Co-evaluation                End of Session Equipment Utilized During Treatment: Gait belt;Left knee immobilizer Activity Tolerance: Patient tolerated treatment well Patient left: in chair;with call bell/phone within reach;with family/visitor present Nurse Communication: Mobility status    Functional Assessment Tool Used: clinical judgement Functional Limitation: Mobility: Walking and moving around Mobility: Walking and Moving Around Current Status [phone]): At least 1 percent but less than 20 percent impaired, limited or restricted Mobility: Walking and Moving Around Goal Status [phone]): At least 1 percent but less than 20 percent impaired, limited or restricted Mobility: Walking and Moving Around Discharge Status [phone]): At least 1 percent but less than 20 percent impaired, limited or restricted    Time: 1012-1047 PT Time Calculation (min) (ACUTE ONLY): 35 min   Charges:   PT Evaluation $Initial PT Evaluation Tier I: 1 Procedure PT Treatments $Gait Training: 8-22 mins   PT G Codes:   PT G-Codes **NOT FOR INPATIENT CLASS** Functional Assessment Tool Used: clinical judgement Functional Limitation: Mobility: Walking and moving around Mobility: Walking and Moving Around Current Status (V1464): At least 1 percent but less than 20 percent impaired, limited or restricted Mobility: Walking and Moving Around Goal Status [phone]): At least 1 percent but less than 20 percent impaired, limited or restricted Mobility: Walking and Moving Around Discharge Status [phone]): At least 1 percent but less than 20 percent impaired, limited or restricted    [facility] 06/08/2015, 11:54 AM

## 2015-06-08 NOTE — Discharge Instructions (Signed)
Dr. Gaynelle Arabian [facility] Specialist [facility] [address] [place], [place] [address] [phone]  QuadTendon Repair Discharge Instructions  HOME CARE INSTRUCTIONS  Remove items at home which could result in a fall. This includes throw rugs or furniture in walking pathways.   ICE to the affected hip every three hours for 30 minutes at a time and then as needed for pain and swelling.  Continue to use ice on the knee for pain and swelling from surgery. You may notice swelling that will progress down to the foot and ankle.  This is normal after surgery.  Elevate the leg when you are not up walking on it.    Continue to use the breathing machine which will help keep your temperature down.  It is common for your temperature to cycle up and down following surgery, especially at night when you are not up moving around and exerting yourself.  The breathing machine keeps your lungs expanded and your temperature down. Sit on high chairs which makes it easier to stand.  Sit on chairs with arms. Use the chair arms to help push yourself up when arising.   NO BENDING OR MOTION TO THE KNEE FOR THE FIRST TWO WEEKS OR UNTIL RETURN FOR FOLLOW UP VISIT IN OFFICE.  Use your walker for first several days until comfortable ambulating.  DIET You may resume your previous home diet once your are discharged from the hospital.  DRESSING / WOUND CARE / SHOWERING You may shower 3 days after surgery, but keep the wounds dry during showering.  You may use an occlusive plastic wrap (Press'n Seal for example), NO SOAKING/SUBMERGING IN THE BATHTUB.  If the bandage gets wet, change with a clean dry gauze.  If the incision gets wet, pat the wound dry with a clean towel.  MAY REMOVE THE KNEE IMMOBILIZER ON FOR SHOWERING IF ABLE TO KEEP THE LEG OUT STRAIGHT. You may start showering three days following surgery but do not submerge the incision under water.Just pat the incision dry and  apply a dry gauze dressing on daily. Change the surgical dressing daily and reapply a dry dressing each time.  ACTIVITY Walk with your walker as instructed. Use walker as long as suggested by your caregivers. Avoid periods of inactivity such as sitting longer than an hour when not asleep. This helps prevent blood clots.  You may resume a sexual relationship in one month or when given the OK by your doctor.  You may return to work once you are cleared by your doctor.  Do not drive a car for 6 weeks or until released by you surgeon.  Do not drive while taking narcotics.  WEIGHT BEARING Other:  Patient may be weight bearing as tolerated to the affected leg but no bending to the knee.   POSTOPERATIVE CONSTIPATION PROTOCOL Constipation - defined medically as fewer than three stools per week and severe constipation as less than one stool per week.  One of the most common issues patients have following surgery is constipation.  Even if you have a regular bowel pattern at home, your normal regimen is likely to be disrupted due to multiple reasons following surgery.  Combination of anesthesia, postoperative narcotics, change in appetite and fluid intake all can affect your bowels.  In order to avoid complications following surgery, here are some recommendations in order to help you during your recovery period.  Colace (docusate) - Pick up an over-the-counter form of Colace or another stool softener and  take twice a day as long as you are requiring postoperative pain medications.  Take with a full glass of water daily.  If you experience loose stools or diarrhea, hold the colace until you stool forms back up.  If your symptoms do not get better within 1 week or if they get worse, check with your doctor.  Dulcolax (bisacodyl) - Pick up over-the-counter and take as directed by the product packaging as needed to assist with the movement of your bowels.  Take with a full glass of water.  Use this product as  needed if not relieved by Colace only.   MiraLax (polyethylene glycol) - Pick up over-the-counter to have on hand.  MiraLax is a solution that will increase the amount of water in your bowels to assist with bowel movements.  Take as directed and can mix with a glass of water, juice, soda, coffee, or tea.  Take if you go more than two days without a movement. Do not use MiraLax more than once per day. Call your doctor if you are still constipated or irregular after using this medication for 7 days in a row.  If you continue to have problems with postoperative constipation, please contact the office for further assistance and recommendations.  If you experience "the worst abdominal pain ever" or develop nausea or vomiting, please contact the office immediatly for further recommendations for treatment.  ITCHING  If you experience itching with your medications, try taking only a single pain pill, or even half a pain pill at a time.  You can also use Benadryl over the counter for itching or also to help with sleep.   TED HOSE STOCKINGS Wear the elastic stockings on both legs for three weeks following surgery during the day but you may remove then at night for sleeping.  MEDICATIONS See your medication summary on the After Visit Summary that the nursing staff will review with you prior to discharge.  You may have some home medications which will be placed on hold until you complete the course of blood thinner medication.  It is important for you to complete the blood thinner medication as prescribed by your surgeon.  Continue your approved medications as instructed at time of discharge.  PRECAUTIONS If you experience chest pain or shortness of breath - call 911 immediately for transfer to the hospital emergency department.  If you develop a fever greater that 101 F, purulent drainage from wound, increased redness or drainage from wound, foul odor from the wound/dressing, or calf pain - CONTACT YOUR  SURGEON.                                                   FOLLOW-UP APPOINTMENTS Make sure you keep all of your appointments after your operation with your surgeon and caregivers. You should call the office at the above phone number and make an appointment for approximately two weeks after the date of your surgery or on the date instructed by your surgeon outlined in the "After Visit Summary".  Weight bearing as tolerated to the surgical leg.    Knee immobilizer at all times.  Do not remove brace except for showering. NO BENDING OR MOTION TO THE LEFT KNEE FOR TWO WEEKS. May start showering three days following surgery but do not submerge incision under water. Continue to use ice for pain  and swelling from surgery.  Full dose aspirin 325 mg daily for three weeks. Please use walker for the first couple of days until comfortable ambulating.  May start changing dressing tomorrow with dry gauze and tape.  Pick up stool softner and laxative for home use following surgery while on pain medications. Do not submerge incision under water. Please use good hand washing techniques while changing dressing each day. Please use a clean towel to pat the incision dry following showers. Continue to use ice for pain and swelling after surgery. Do not use any lotions or creams on the incision until instructed by your surgeon.  Take a full dose 325 mg Aspirin daily for three weeks.

## 2015-06-08 NOTE — Discharge Summary (Signed)
Physician Discharge Summary   Patient ID: Daniel Yu MRN: [id number] DOB/AGE: 09-17-1931 81 y.o.  Admit date: 06/07/2015 Discharge date: 06-08-2015  Primary Diagnosis:  Left quadriceps tendon tear. Admission Diagnoses:  Past Medical History  Diagnosis Date  . Hypertension   . Asthma   . Arthritis     right knee oa   Discharge Diagnoses:   Principal Problem:   Rupture of left quadriceps tendon Active Problems:   Quadriceps tendon rupture  Estimated body mass index is 26 kg/(m^2) as calculated from the following:   Height as of this encounter: 5' 11.5" (1.816 m).   Weight as of this encounter: 85.73 kg (189 lb).  Procedure:  Procedure(s) (LRB): REPAIR LEFT QUADRICEP TENDON (Left)   Consults: None  HPI: Daniel Yu is an 81 year old male who had a fall approximately 2 weeks ago sustaining a ruptured quadriceps tendon. We saw him in the office last week and MRI confirmed the diagnosis. He presents now for quadriceps tendon repair.  Laboratory Data: Hospital Outpatient Visit on 06/04/2015  Component Date Value Ref Range Status  . WBC 06/04/2015 7.8  4.0 - 10.5 K/uL Final  . RBC 06/04/2015 4.52  4.22 - 5.81 MIL/uL Final  . Hemoglobin 06/04/2015 15.3  13.0 - 17.0 g/dL Final  . HCT 06/04/2015 43.9  39.0 - 52.0 % Final  . MCV 06/04/2015 97.1  78.0 - 100.0 fL Final  . MCH 06/04/2015 33.8  26.0 - 34.0 pg Final  . MCHC 06/04/2015 34.9  30.0 - 36.0 g/dL Final  . RDW 06/04/2015 12.6  11.5 - 15.5 % Final  . Platelets 06/04/2015 342  150 - 400 K/uL Final  . Sodium 06/04/2015 132* 135 - 145 mmol/L Final  . Potassium 06/04/2015 4.3  3.5 - 5.1 mmol/L Final  . Chloride 06/04/2015 98* 101 - 111 mmol/L Final  . CO2 06/04/2015 24  22 - 32 mmol/L Final  . Glucose, Bld 06/04/2015 105* 65 - 99 mg/dL Final  . BUN 06/04/2015 14  6 - 20 mg/dL Final  . Creatinine, Ser 06/04/2015 0.63  0.61 - 1.24 mg/dL Final  . Calcium 06/04/2015 8.8* 8.9 - 10.3 mg/dL Final  . GFR calc non Af Amer  06/04/2015 >60  >60 mL/min Final  . GFR calc Af Amer 06/04/2015 >60  >60 mL/min Final   Comment: (NOTE) The eGFR has been calculated using the CKD EPI equation. This calculation has not been validated in all clinical situations. eGFR's persistently <60 mL/min signify possible Chronic Kidney Disease.   . Anion gap 06/04/2015 10  5 - 15 Final     X-Rays:No results found.  EKG: Orders placed or performed during the hospital encounter of 06/04/15  . EKG 12-Lead  . EKG 12-Lead     Hospital Course: Daniel Yu is a 81 y.o. who was admitted to [facility]. They were brought to the operating room on 06/07/2015 and underwent Procedure(s): REPAIR LEFT QUADRICEP TENDON.  Patient tolerated the procedure well and was later transferred to the recovery room and then to the orthopaedic floor for postoperative care.  They were given PO and IV analgesics for pain control following their surgery.  They were given 24 hours of postoperative antibiotics of  Anti-infectives    Start     Dose/Rate Route Frequency Ordered Stop   06/08/15 0600  ceFAZolin (ANCEF) IVPB 2 g/50 mL premix     2 g 100 mL/hr over 30 Minutes Intravenous On call to O.R. 06/07/15 1259 06/07/15 1700  06/07/15 2300  ceFAZolin (ANCEF) IVPB 2 g/50 mL premix     2 g 100 mL/hr over 30 Minutes Intravenous Every 6 hours 06/07/15 1844 06/08/15 1659     and started on DVT prophylaxis in the form of Lovenox.   PT was ordered to assist with transfers and mobility.  Discharge planning consulted to help with postop disposition and equipment needs.  Patient had a good night on the evening of surgery.  They started to get up OOB with therapy on day one. Hemovac drain was pulled without difficulty.  Patient was seen in rounds on day one and it was felt that as long as they did well with the remaining sessions of therapy that they would be ready to go home.  Arrangements were made and they were setup to go home on POD 1.  Discharge home  (does NOT need home health) Diet - Cardiac diet Follow up - in two weeks. Call office for appointment at [phone]. Activity - Weight bearing as tolerated to the surgical leg.  Knee immobilizer at all times. Do not remove brace except for showering. NO BENDING OR MOTION TO THE LEFT KNEE FOR TWO WEEKS. May start showering three days following surgery but do not submerge incision under water. Continue to use ice for pain and swelling from surgery.  Full dose aspirin 325 mg daily for three weeks. Please use walker for the first couple of days until comfortable ambulating.  May start changing dressing tomorrow with dry gauze and tape.  Disposition - Home Condition Upon Discharge - Stable D/C Meds - See DC Summary DVT Prophylaxis - Aspirin 325 mg daily     Medication List    TAKE these medications        ASCRIPTIN 325 MG Tabs  Take 325 mg by mouth daily. Take a full dose 325 mg Aspirin daily for three weeks.     Budesonide 90 MCG/ACT inhaler  Inhale 1 puff into the lungs 2 (two) times daily.     methocarbamol 500 MG tablet  Commonly known as:  ROBAXIN  Take 1 tablet (500 mg total) by mouth every 6 (six) hours as needed for muscle spasms.     oxyCODONE 5 MG immediate release tablet  Commonly known as:  Oxy IR/ROXICODONE  Take 1-2 tablets (5-10 mg total) by mouth every 3 (three) hours as needed for moderate pain or severe pain.     traMADol 50 MG tablet  Commonly known as:  ULTRAM  Take 1-2 tablets (50-100 mg total) by mouth every 6 (six) hours as needed (mild pain).     valsartan-hydrochlorothiazide 160-12.5 MG tablet  Commonly known as:  DIOVAN-HCT  Take 1 tablet by mouth every morning.           Follow-up Information    Follow up with Gearlean Alf, MD. Schedule an appointment as soon as possible for a visit on 06/22/2015.   Specialty:  Orthopedic Surgery   Why:  Call office at [phone] to setup appointment with Dr. Wynelle Link on Tuesday 06/22/2015.   Contact  information:   [address] [place] [address] [phone]       Signed: Arlee Muslim, PA-C Orthopaedic Surgery 06/08/2015, 8:46 AM

## 2015-06-08 NOTE — Op Note (Signed)
NAMEMANFORD, SPRONG NO.:  [id number]  MEDICAL RECORD NO.:  [id number]  LOCATION:  8270                         FACILITY:  [facility]  PHYSICIAN:  Gaynelle Arabian, M.D.    DATE OF BIRTH:  04/11/1932  DATE OF PROCEDURE:  06/07/2015 DATE OF DISCHARGE:                              OPERATIVE REPORT   PREOPERATIVE DIAGNOSIS:  Left quadriceps tendon tear.  POSTOPERATIVE DIAGNOSIS:  Left quadriceps tendon tear.  PROCEDURE:  Repair of left quadriceps tendon tear.  SURGEON:  Gaynelle Arabian, M.D.  ASSISTANT:  Alexzandrew L. Perkins, PA-C  ANESTHESIA:  General.  ESTIMATED BLOOD LOSS:  Minimal.  DRAINS:  Hemovac x1.  COMPLICATIONS:  None.  TOURNIQUET TIME:  34 minutes at 300 mmHg.  CONDITION:  Stable to recovery.  BRIEF CLINICAL NOTE:  Mr. Stcyr is an 81 year old male who had a fall approximately 2 weeks ago sustaining a ruptured quadriceps tendon.  We saw him in the office last week and MRI confirmed the diagnosis.  He presents now for quadriceps tendon repair.  PROCEDURE IN DETAIL:  After successful administration of general anesthetic, a tourniquet was placed high on the left thigh and left lower extremity prepped and draped in usual sterile fashion.  Extremity was wrapped in Esmarch, knee flexed, and tourniquet inflated to 300 mmHg.  A midline incision was made with a #10 blade through subcutaneous tissue.  There was a palpable obvious defect in the quad tendon just above the superior aspect of the patella.  Superficial retinacular layer is intact and I cut through this to show the rupture and retraction of the tendons.  The tendon edges appear to be in good condition.  I took three #2 FiberWire sutures and ran each in a Bunnell type fashion with 1 being at the medial third of the tendon, the other at the central third, and the other at the lateral portion of the tendon.  We started proximal and weaved them distally through the area of the rupture.  I drilled  4 holes in the patella going from superior to inferior and then passed all the sutures through the holes.  The sutures were tied together and then tied to each other.  This led to a very stable repair.  I flexed down greater than 90 degrees.  Repair remained intact.  We then repaired the superficial retinaculum with interrupted #2 Ethibond suture.  We again flexed and had a very stable repair.  Wound was copiously irrigated with saline solution.  Hemovac drain was then placed deep into the joint through a tiny lateral arthrotomy.  Tourniquet was released for a total time of 34 minutes.  Subcu was closed with interrupted 2-0 Vicryl and subcuticular running 4-0 Monocryl.  The drain was hooked to suction. Incision cleaned and dried.  Steri-Strips and a bulky sterile dressing applied.  He was then placed into a knee immobilizer, awakened and transported to recovery room in stable condition.  Please note that a surgical assistant was a medical necessity for this procedure to do it in a safe and expeditious manner.  Surgical assistant was necessary for retraction of vital structures and to assist with the Bunnell weave of the suture through  the tendon.     Gaynelle Arabian, M.D.     FA/MEDQ  D:  06/07/2015  T:  06/08/2015  Job:  [id number]

## 2015-09-10 ENCOUNTER — Inpatient Hospital Stay (HOSPITAL_COMMUNITY): Admission: RE | Admit: 2015-09-10 | Payer: Medicare Other | Source: Ambulatory Visit | Admitting: Orthopedic Surgery

## 2015-09-10 ENCOUNTER — Encounter (HOSPITAL_COMMUNITY): Admission: RE | Payer: Self-pay | Source: Ambulatory Visit

## 2015-09-10 SURGERY — ARTHROPLASTY, KNEE, TOTAL
Anesthesia: Choice | Site: Knee | Laterality: Right

## 2015-11-30 ENCOUNTER — Ambulatory Visit: Payer: Self-pay | Admitting: Orthopedic Surgery

## 2015-12-14 ENCOUNTER — Ambulatory Visit: Payer: Self-pay | Admitting: Orthopedic Surgery

## 2015-12-14 NOTE — Progress Notes (Signed)
Preoperative surgical orders have been place into the Epic hospital system for Daniel Yu on 12/14/2015, 10:07 AM  by Mickel Crow for surgery on 12-27-2015.  Preop Total Knee orders including Experal, IV Tylenol, and IV Decadron as long as there are no contraindications to the above medications. Arlee Muslim, PA-C

## 2015-12-15 ENCOUNTER — Encounter (HOSPITAL_COMMUNITY)
Admission: RE | Admit: 2015-12-15 | Discharge: 2015-12-15 | Disposition: A | Discharge status: Home / Self Care | Payer: Medicare Other | Source: Ambulatory Visit | Attending: Orthopedic Surgery | Admitting: Orthopedic Surgery

## 2015-12-15 ENCOUNTER — Encounter (HOSPITAL_COMMUNITY): Payer: Self-pay

## 2015-12-15 DIAGNOSIS — Z01812 Encounter for preprocedural laboratory examination: Secondary | ICD-10-CM | POA: Insufficient documentation

## 2015-12-15 DIAGNOSIS — M179 Osteoarthritis of knee, unspecified: Secondary | ICD-10-CM | POA: Insufficient documentation

## 2015-12-15 HISTORY — DX: Carpal tunnel syndrome, unspecified upper limb: G56.00

## 2015-12-15 LAB — CBC
HCT: 39.6 % (ref 39.0–52.0)
Hemoglobin: 14.2 g/dL (ref 13.0–17.0)
MCH: 34.8 pg — ABNORMAL HIGH (ref 26.0–34.0)
MCHC: 35.9 g/dL (ref 30.0–36.0)
MCV: 97.1 fL (ref 78.0–100.0)
PLATELETS: 290 10*3/uL (ref 150–400)
RBC: 4.08 MIL/uL — ABNORMAL LOW (ref 4.22–5.81)
RDW: 13 % (ref 11.5–15.5)
WBC: 5.7 10*3/uL (ref 4.0–10.5)

## 2015-12-15 LAB — COMPREHENSIVE METABOLIC PANEL
ALBUMIN: 4.3 g/dL (ref 3.5–5.0)
ALT: 17 U/L (ref 17–63)
AST: 21 U/L (ref 15–41)
Alkaline Phosphatase: 57 U/L (ref 38–126)
Anion gap: 9 (ref 5–15)
BUN: 11 mg/dL (ref 6–20)
CHLORIDE: 99 mmol/L — AB (ref 101–111)
CO2: 24 mmol/L (ref 22–32)
Calcium: 9 mg/dL (ref 8.9–10.3)
Creatinine, Ser: 0.7 mg/dL (ref 0.61–1.24)
GFR calc Af Amer: >60 mL/min (ref >60)
Glucose, Bld: 92 mg/dL (ref 65–99)
POTASSIUM: 4.6 mmol/L (ref 3.5–5.1)
SODIUM: 132 mmol/L — AB (ref 135–145)
TOTAL PROTEIN: 7.2 g/dL (ref 6.5–8.1)
Total Bilirubin: 0.9 mg/dL (ref 0.3–1.2)

## 2015-12-15 LAB — URINALYSIS, ROUTINE W REFLEX MICROSCOPIC
BILIRUBIN URINE: NEGATIVE
GLUCOSE, UA: NEGATIVE mg/dL
HGB URINE DIPSTICK: NEGATIVE
KETONES UR: NEGATIVE mg/dL
Leukocytes, UA: NEGATIVE
NITRITE: NEGATIVE
PH: 7.5 (ref 5.0–8.0)
Protein, ur: NEGATIVE mg/dL
Specific Gravity, Urine: 1.016 (ref 1.005–1.030)

## 2015-12-15 LAB — SURGICAL PCR SCREEN
MRSA, PCR: NEGATIVE
Staphylococcus aureus: NEGATIVE

## 2015-12-15 LAB — PROTIME-INR
INR: 1.08 (ref 0.00–1.49)
PROTHROMBIN TIME: 14.2 s (ref 11.6–15.2)

## 2015-12-15 LAB — APTT: aPTT: 28 seconds (ref 24–37)

## 2015-12-15 NOTE — Patient Instructions (Addendum)
YOUR PROCEDURE IS SCHEDULED ON : 12/27/15  REPORT TO [facility] FOLLOW SIGNS TO EAST ELEVATOR - GO TO 3rd FLOOR CHECK IN AT 3 EAST NURSES STATION (SHORT STAY) AT:  7:30 AM  CALL THIS NUMBER IF YOU HAVE PROBLEMS THE MORNING OF SURGERY [phone]  REMEMBER:ONLY 1 PER PERSON MAY GO TO SHORT STAY WITH YOU TO GET READY THE MORNING OF YOUR SURGERY  DO NOT EAT FOOD OR DRINK LIQUIDS AFTER MIDNIGHT  TAKE THESE MEDICINES THE MORNING OF SURGERY: USE FLOVENT INHALER  YOU MAY NOT HAVE ANY METAL ON YOUR BODY INCLUDING HAIR PINS AND PIERCING'S. DO NOT WEAR JEWELRY, MAKEUP, LOTIONS, POWDERS OR PERFUMES. DO NOT WEAR NAIL POLISH. DO NOT SHAVE 48 HRS PRIOR TO SURGERY. MEN MAY SHAVE FACE AND NECK.  DO NOT [place]. [facility] IS NOT RESPONSIBLE FOR VALUABLES.  CONTACTS, DENTURES OR PARTIALS MAY NOT BE WORN TO SURGERY. LEAVE SUITCASE IN CAR. CAN BE BROUGHT TO ROOM AFTER SURGERY.  PATIENTS DISCHARGED THE DAY OF SURGERY WILL NOT BE ALLOWED TO DRIVE HOME.  PLEASE READ OVER THE FOLLOWING INSTRUCTION SHEETS _________________________________________________________________________________                                          [place] - PREPARING FOR SURGERY  Before surgery, you can play an important role.  Because skin is not sterile, your skin needs to be as free of germs as possible.  You can reduce the number of germs on your skin by washing with CHG (chlorahexidine gluconate) soap before surgery.  CHG is an antiseptic cleaner which kills germs and bonds with the skin to continue killing germs even after washing. Please DO NOT use if you have an allergy to CHG or antibacterial soaps.  If your skin becomes reddened/irritated stop using the CHG and inform your nurse when you arrive at Short Stay. Do not shave (including legs and underarms) for at least 48 hours prior to the first CHG shower.  You may shave your face. Please follow these instructions  carefully:   1.  Shower with CHG Soap the night before surgery and the  morning of Surgery.   2.  If you choose to wash your hair, wash your hair first as usual with your  normal  Shampoo.   3.  After you shampoo, rinse your hair and body thoroughly to remove the  shampoo.                                         4.  Use CHG as you would any other liquid soap.  You can apply chg directly  to the skin and wash . Gently wash with scrungie or clean wascloth    5.  Apply the CHG Soap to your body ONLY FROM THE NECK DOWN.   Do not use on open                           Wound or open sores. Avoid contact with eyes, ears mouth and genitals (private parts).                        Genitals (private parts) with your normal soap.  6.  Wash thoroughly, paying special attention to the area where your surgery  will be performed.   7.  Thoroughly rinse your body with warm water from the neck down.   8.  DO NOT shower/wash with your normal soap after using and rinsing off  the CHG Soap .                9.  Pat yourself dry with a clean towel.             10.  Wear clean night clothes to bed after shower             11.  Place clean sheets on your bed the night of your first shower and do not  sleep with pets.  Day of Surgery : Do not apply any lotions/deodorants the morning of surgery.  Please wear clean clothes to the hospital/surgery center.  FAILURE TO FOLLOW THESE INSTRUCTIONS MAY RESULT IN THE CANCELLATION OF YOUR SURGERY    PATIENT SIGNATURE_________________________________  ______________________________________________________________________     [facility]  An incentive spirometer is a tool that can help keep your lungs clear and active. This tool measures how well you are filling your lungs with each breath. Taking long deep breaths may help reverse or decrease the chance of developing breathing (pulmonary) problems (especially infection) following:  A long  period of time when you are unable to move or be active. BEFORE THE PROCEDURE   If the spirometer includes an indicator to show your best effort, your nurse or respiratory therapist will set it to a desired goal.  If possible, sit up straight or lean slightly forward. Try not to slouch.  Hold the incentive spirometer in an upright position. INSTRUCTIONS FOR USE   Sit on the edge of your bed if possible, or sit up as far as you can in bed or on a chair.  Hold the incentive spirometer in an upright position.  Breathe out normally.  Place the mouthpiece in your mouth and seal your lips tightly around it.  Breathe in slowly and as deeply as possible, raising the piston or the ball toward the top of the column.  Hold your breath for 3-5 seconds or for as long as possible. Allow the piston or ball to fall to the bottom of the column.  Remove the mouthpiece from your mouth and breathe out normally.  Rest for a few seconds and repeat Steps 1 through 7 at least 10 times every 1-2 hours when you are awake. Take your time and take a few normal breaths between deep breaths.  The spirometer may include an indicator to show your best effort. Use the indicator as a goal to work toward during each repetition.  After each set of 10 deep breaths, practice coughing to be sure your lungs are clear. If you have an incision (the cut made at the time of surgery), support your incision when coughing by placing a pillow or rolled up towels firmly against it. Once you are able to get out of bed, walk around indoors and cough well. You may stop using the incentive spirometer when instructed by your caregiver.  RISKS AND COMPLICATIONS  Take your time so you do not get dizzy or light-headed.  If you are in pain, you may need to take or ask for pain medication before doing incentive spirometry. It is harder to take a deep breath if you are having pain. AFTER USE  Rest and breathe slowly and easily.  It can  be helpful to keep track of a log of your progress. Your caregiver can provide you with a simple table to help with this. If you are using the spirometer at home, follow these instructions: [name] IF:   You are having difficultly using the spirometer.  You have trouble using the spirometer as often as instructed.  Your pain medication is not giving enough relief while using the spirometer.  You develop fever of 100.5 F (38.1 C) or higher. SEEK IMMEDIATE MEDICAL CARE IF:   You cough up bloody sputum that had not been present before.  You develop fever of 102 F (38.9 C) or greater.  You develop worsening pain at or near the incision site. MAKE SURE YOU:   Understand these instructions.  Will watch your condition.  Will get help right away if you are not doing well or get worse. Document Released: 12/18/2006 Document Revised: 10/30/2011 Document Reviewed: 02/18/2007 ExitCare Patient Information 2014 Exit[employer], [place].   ________________________________________________________________________  WHAT IS A BLOOD TRANSFUSION? Blood Transfusion Information  A transfusion is the replacement of blood or some of its parts. Blood is made up of multiple cells which provide different functions.  Red blood cells carry oxygen and are used for blood loss replacement.  White blood cells fight against infection.  Platelets control bleeding.  Plasma helps clot blood.  Other blood products are available for specialized needs, such as hemophilia or other clotting disorders. BEFORE THE TRANSFUSION  Who gives blood for transfusions?   Healthy volunteers who are fully evaluated to make sure their blood is safe. This is blood bank blood. Transfusion therapy is the safest it has ever been in the practice of medicine. Before blood is taken from a donor, a complete history is taken to make sure that person has no history of diseases nor engages in risky social behavior (examples are  intravenous drug use or sexual activity with multiple partners). The donor's travel history is screened to minimize risk of transmitting infections, such as malaria. The donated blood is tested for signs of infectious diseases, such as HIV and hepatitis. The blood is then tested to be sure it is compatible with you in order to minimize the chance of a transfusion reaction. If you or a relative donates blood, this is often done in anticipation of surgery and is not appropriate for emergency situations. It takes many days to process the donated blood. RISKS AND COMPLICATIONS Although transfusion therapy is very safe and saves many lives, the main dangers of transfusion include:   Getting an infectious disease.  Developing a transfusion reaction. This is an allergic reaction to something in the blood you were given. Every precaution is taken to prevent this. The decision to have a blood transfusion has been considered carefully by your caregiver before blood is given. Blood is not given unless the benefits outweigh the risks. AFTER THE TRANSFUSION  Right after receiving a blood transfusion, you will usually feel much better and more energetic. This is especially true if your red blood cells have gotten low (anemic). The transfusion raises the level of the red blood cells which carry oxygen, and this usually causes an energy increase.  The nurse administering the transfusion will monitor you carefully for complications. HOME CARE INSTRUCTIONS  No special instructions are needed after a transfusion. You may find your energy is better. Speak with your caregiver about any limitations on activity for underlying diseases you may have. SEEK MEDICAL CARE IF:   Your  condition is not improving after your transfusion.  You develop redness or irritation at the intravenous (IV) site. SEEK IMMEDIATE MEDICAL CARE IF:  Any of the following symptoms occur over the next 12 hours:  Shaking chills.  You have a  temperature by mouth above 102 F (38.9 C), not controlled by medicine.  Chest, back, or muscle pain.  People around you feel you are not acting correctly or are confused.  Shortness of breath or difficulty breathing.  Dizziness and fainting.  You get a rash or develop hives.  You have a decrease in urine output.  Your urine turns a dark color or changes to pink, red, or brown. Any of the following symptoms occur over the next 10 days:  You have a temperature by mouth above 102 F (38.9 C), not controlled by medicine.  Shortness of breath.  Weakness after normal activity.  The white part of the eye turns yellow (jaundice).  You have a decrease in the amount of urine or are urinating less often.  Your urine turns a dark color or changes to pink, red, or brown. Document Released: 08/04/2000 Document Revised: 10/30/2011 Document Reviewed: 03/23/2008 [facility] Patient Information 2014 [place], [place].  _______________________________________________________________________

## 2015-12-20 DIAGNOSIS — K56609 Unspecified intestinal obstruction, unspecified as to partial versus complete obstruction: Secondary | ICD-10-CM

## 2015-12-20 HISTORY — DX: Unspecified intestinal obstruction, unspecified as to partial versus complete obstruction: K56.609

## 2015-12-26 ENCOUNTER — Ambulatory Visit: Payer: Self-pay | Admitting: Orthopedic Surgery

## 2015-12-26 NOTE — H&P (Signed)
Daniel Yu  DOB: 1932-03-17 Married / Language: Daniel Yu / Race: White Male  Date of Admission:  12/27/2015 CC:  Right Knee Pain  History of Present Illness  The patient is a 82 year old male who comes in  for a preoperative History and Physical. The patient is scheduled for a right total knee arthroplasty to be performed by Dr. Dione Plover. [name], MD at [facility] on 12-27-2015. The patient was seen for a second opinion. The patient reports right knee symptoms including: pain which began year(s) ago following a specific injury. The patient describes the severity of the symptoms as mild. The patient describes their pain as aching.The patient feels that the symptoms are worsening. The patient has the current diagnosis of knee osteoarthritis. Prior to being seen, the patient was previously evaluated by Dr.Peter Dalldorf month(s) ago. Previous work-up for this problem has included knee x-rays and orthopedic consultation Research scientist (life sciences)). Past treatment for this problem has included intra-articular injection of corticosteroids (cortisone and Supartz). Symptoms are reported to be located in the right knee and include knee pain. The patient does not report any radiation of symptoms. Onset of symptoms was gradual with symptoms now occurring frequently. Symptoms are exacerbated by walking (long distance). Current treatment includes nonsteroidal anti-inflammatory drugs (Aleve (prn)). Note for "Knee pain": Knee is limiting his activity level. Loves to walk long distance. Mr. Merklin is an extremely active man and wishes to continue being active. The knee has been holding him up and preventing him from doing a lot of what he desires. He states that the knee hurts with most activities. He has had cortisone and viscosupplement injections without much benefit. He used to walk four miles a day and would like to get back to doing that if possible. He is ready to proceed with surgery at this time. They have  been treated conservatively in the past for the above stated problem and despite conservative measures, they continue to have progressive pain and severe functional limitations and dysfunction. They have failed non-operative management including home exercise, medications, and injections. It is felt that they would benefit from undergoing total joint replacement. Risks and benefits of the procedure have been discussed with the patient and they elect to proceed with surgery. There are no active contraindications to surgery such as ongoing infection or rapidly progressive neurological disease.    Problem List/Past Medical  Problems Reconciled  Strain of left quadriceps muscle, fascia and tendon, subsequent encounter [id number])  Traumatic rupture Chronic pain of right knee (M25.561)  Encounter for care following Quadriceps Tendon Repair (Z47.89)  Primary osteoarthritis of right knee (M17.11)  Left carpal tunnel syndrome (G56.02)  Heart murmur  High blood pressure  Impaired Vision  Tinnitus  Allergies No Known Drug Allergies   Family History Cancer  sister Cerebrovascular Accident  sister Heart Disease  sister Hypertension  sister  Social History  Number of flights of stairs before winded  greater than 5 Marital status  married Living situation  live with spouse Tobacco use  never smoker Tobacco / smoke exposure  no Pain Contract  no Illicit drug use  no Current work status  retired Children  2 Alcohol use  current drinker; drinks beer and wine; 8-14 per week Exercise  Exercises weekly; does running / walking and individual sport Drug/Alcohol Rehab (Previously)  no Drug/Alcohol Rehab (Currently)  no Advance Directives  Healthcare POA Post-Surgical Plans  Home  Medication History Aspirin (81MG  Tablet, Oral) Active. Valsartan-Hydrochlorothiazide (160-12.5MG  Tablet, Oral) Active. Aleve (  prn) Active.  Past Surgical History  Inguinal Hernia Repair   open: left Vasectomy  Left Knee Quad Tendon Repair    Review of Systems  General Not Present- Chills, Fatigue, Fever, Memory Loss, Night Sweats, Weight Gain and Weight Loss. Skin Not Present- Eczema, Hives, Itching, Lesions and Rash. HEENT Not Present- Dentures, Double Vision, Headache, Hearing Loss, Tinnitus and Visual Loss. Respiratory Not Present- Allergies, Chronic Cough, Coughing up blood, Shortness of breath at rest and Shortness of breath with exertion. Cardiovascular Not Present- Chest Pain, Difficulty Breathing Lying Down, Murmur, Palpitations, Racing/skipping heartbeats and Swelling. Gastrointestinal Not Present- Abdominal Pain, Bloody Stool, Constipation, Diarrhea, Difficulty Swallowing, Heartburn, Jaundice, Loss of appetitie, Nausea and Vomiting. Male Genitourinary Not Present- Blood in Urine, Discharge, Flank Pain, Incontinence, Painful Urination, Urgency, Urinary frequency, Urinary Retention, Urinating at Night and Weak urinary stream. Musculoskeletal Not Present- Back Pain, Joint Pain, Joint Swelling, Morning Stiffness, Muscle Pain, Muscle Weakness and Spasms. Neurological Not Present- Blackout spells, Difficulty with balance, Dizziness, Paralysis, Tremor and Weakness. Psychiatric Not Present- Insomnia.  Vitals  Weight: 190 lb Height: 71in Body Surface Area: 2.06 m Body Mass Index: 26.5 kg/m  Pulse: 56 (Irregular)  Resp.: 12 (Unlabored)  BP: 148/82 (Sitting, Left Arm, Standard)       Physical Exam General Mental Status -Alert, cooperative and good historian. General Appearance-pleasant, Not in acute distress. Orientation-Oriented X3. Build & Nutrition-Well nourished and Well developed.  Head and Neck Head-normocephalic, atraumatic . Neck Global Assessment - supple, no bruit auscultated on the right, no bruit auscultated on the left.  Eye Vision-Wears corrective lenses. Pupil - Bilateral-Regular and Round. Motion -  Bilateral-EOMI.  ENMT Note: bilateral hearing aids   Chest and Lung Exam Auscultation Breath sounds - clear at anterior chest wall and clear at posterior chest wall. Adventitious sounds - No Adventitious sounds.  Cardiovascular Auscultation Rhythm - Regular rate and rhythm. Heart Sounds - S1 WNL and S2 WNL. Murmurs & Other Heart Sounds - Auscultation of the heart reveals - No Murmurs.  Abdomen Palpation/Percussion Tenderness - Abdomen is non-tender to palpation. Rigidity (guarding) - Abdomen is soft. Auscultation Auscultation of the abdomen reveals - Bowel sounds normal.  Male Genitourinary Note: Not done, not pertinent to present illness   Musculoskeletal Note: Well developed male, in no distress. Evaluation of his hips show normal range of motion with no discomfort. His left knee shows no effusion. His range of motion of the left knee is about 0 to 135 with no tenderness or instability. Right knee slight varus, range about 5 to 130, moderate crepitus on range of motion, tenderness medial greater than lateral with no instability noted. Pulse, sensation, motor intact. He walks with an antalgic gait pattern on the right.  RADIOGRAPHS Radiographs taken today, AP both knees and lateral of the right, show bone on bone arthritis in the medial and patellofemoral compartments of the right knee. His left knee looks normal.   Assessment & Plan  Primary osteoarthritis of right knee (M17.11)  Note:Surgical Plans: Right Total Knee Replacement  Disposition: Home  PCP: Dr. Lavone Orn - Patient has been seen preoperatively and felt to be stable for surgery.  IV TXA  Anesthesia Issues: None  Signed electronically by Ok Edwards, III PA-C

## 2015-12-27 ENCOUNTER — Inpatient Hospital Stay (HOSPITAL_COMMUNITY): Payer: Medicare Other | Admitting: Anesthesiology

## 2015-12-27 ENCOUNTER — Inpatient Hospital Stay (HOSPITAL_COMMUNITY)
Admission: RE | Admit: 2015-12-27 | Discharge: 2015-12-29 | DRG: 470 | Disposition: A | Discharge status: Home Health | Payer: Medicare Other | Source: Ambulatory Visit | Attending: Orthopedic Surgery | Admitting: Orthopedic Surgery

## 2015-12-27 ENCOUNTER — Encounter (HOSPITAL_COMMUNITY)
Admission: RE | Disposition: A | Discharge status: Home Health | Payer: Self-pay | Source: Ambulatory Visit | Attending: Orthopedic Surgery

## 2015-12-27 ENCOUNTER — Encounter (HOSPITAL_COMMUNITY): Payer: Self-pay

## 2015-12-27 DIAGNOSIS — I10 Essential (primary) hypertension: Secondary | ICD-10-CM | POA: Diagnosis present

## 2015-12-27 DIAGNOSIS — M1710 Unilateral primary osteoarthritis, unspecified knee: Secondary | ICD-10-CM | POA: Diagnosis present

## 2015-12-27 DIAGNOSIS — M179 Osteoarthritis of knee, unspecified: Secondary | ICD-10-CM | POA: Diagnosis present

## 2015-12-27 DIAGNOSIS — G8929 Other chronic pain: Secondary | ICD-10-CM | POA: Diagnosis present

## 2015-12-27 DIAGNOSIS — Z7982 Long term (current) use of aspirin: Secondary | ICD-10-CM | POA: Diagnosis not present

## 2015-12-27 DIAGNOSIS — M1711 Unilateral primary osteoarthritis, right knee: Secondary | ICD-10-CM | POA: Diagnosis present

## 2015-12-27 DIAGNOSIS — M25561 Pain in right knee: Secondary | ICD-10-CM | POA: Diagnosis present

## 2015-12-27 HISTORY — PX: TOTAL KNEE ARTHROPLASTY: SHX125

## 2015-12-27 LAB — TYPE AND SCREEN
ABO/RH(D): B POS
Antibody Screen: NEGATIVE

## 2015-12-27 LAB — ABO/RH: ABO/RH(D): B POS

## 2015-12-27 SURGERY — ARTHROPLASTY, KNEE, TOTAL
Anesthesia: Spinal | Site: Knee | Laterality: Right

## 2015-12-27 MED ORDER — POLYETHYLENE GLYCOL 3350 17 G PO PACK: 17.0000 g | PACK | Freq: Every day | ORAL | Status: DC | PRN

## 2015-12-27 MED ORDER — BUDESONIDE 0.25 MG/2ML IN SUSP
0.2500 mg | Freq: Two times a day (BID) | RESPIRATORY_TRACT | Status: DC
  Administered 2015-12-27 – 2015-12-29 (×4): 0.25 mg via RESPIRATORY_TRACT
  Filled 2015-12-27 (×7): qty 2

## 2015-12-27 MED ORDER — VALSARTAN-HYDROCHLOROTHIAZIDE 160-12.5 MG PO TABS: 1.0000 | ORAL_TABLET | Freq: Every morning | ORAL | Status: DC

## 2015-12-27 MED ORDER — EPHEDRINE SULFATE 50 MG/ML IJ SOLN
INTRAMUSCULAR | Status: AC
  Filled 2015-12-27: qty 1

## 2015-12-27 MED ORDER — DEXAMETHASONE SODIUM PHOSPHATE 10 MG/ML IJ SOLN
10.0000 mg | Freq: Once | INTRAMUSCULAR | Status: AC
  Administered 2015-12-27: 10 mg via INTRAVENOUS

## 2015-12-27 MED ORDER — TRANEXAMIC ACID 1000 MG/10ML IV SOLN
1000.0000 mg | INTRAVENOUS | Status: AC
  Administered 2015-12-27: 1000 mg via INTRAVENOUS
  Filled 2015-12-27: qty 10

## 2015-12-27 MED ORDER — DEXAMETHASONE SODIUM PHOSPHATE 10 MG/ML IJ SOLN
10.0000 mg | Freq: Once | INTRAMUSCULAR | Status: AC
  Administered 2015-12-28: 10 mg via INTRAVENOUS
  Filled 2015-12-27: qty 1

## 2015-12-27 MED ORDER — MIDAZOLAM HCL 5 MG/5ML IJ SOLN
INTRAMUSCULAR | Status: DC | PRN
  Administered 2015-12-27 (×2): 1 mg via INTRAVENOUS

## 2015-12-27 MED ORDER — ACETAMINOPHEN 10 MG/ML IV SOLN
INTRAVENOUS | Status: AC
  Filled 2015-12-27: qty 100

## 2015-12-27 MED ORDER — SODIUM CHLORIDE 0.9 % IR SOLN
Status: DC | PRN
Start: 2015-12-27 — End: 2015-12-27
  Administered 2015-12-27: 1000 mL

## 2015-12-27 MED ORDER — ACETAMINOPHEN 650 MG RE SUPP: 650.0000 mg | Freq: Four times a day (QID) | RECTAL | Status: DC | PRN

## 2015-12-27 MED ORDER — MORPHINE SULFATE (PF) 2 MG/ML IV SOLN
1.0000 mg | INTRAVENOUS | Status: DC | PRN
  Administered 2015-12-27: 1 mg via INTRAVENOUS
  Filled 2015-12-27: qty 1

## 2015-12-27 MED ORDER — TRAMADOL HCL 50 MG PO TABS: 50.0000 mg | ORAL_TABLET | Freq: Four times a day (QID) | ORAL | Status: DC | PRN

## 2015-12-27 MED ORDER — ACETAMINOPHEN 10 MG/ML IV SOLN
1000.0000 mg | Freq: Once | INTRAVENOUS | Status: AC
  Administered 2015-12-27: 1000 mg via INTRAVENOUS
  Filled 2015-12-27: qty 100

## 2015-12-27 MED ORDER — MENTHOL 3 MG MT LOZG: 1.0000 | LOZENGE | OROMUCOSAL | Status: DC | PRN

## 2015-12-27 MED ORDER — ONDANSETRON HCL 4 MG/2ML IJ SOLN
INTRAMUSCULAR | Status: DC | PRN
  Administered 2015-12-27: 4 mg via INTRAVENOUS

## 2015-12-27 MED ORDER — ACETAMINOPHEN 500 MG PO TABS
1000.0000 mg | ORAL_TABLET | Freq: Four times a day (QID) | ORAL | Status: AC
  Administered 2015-12-27 – 2015-12-28 (×4): 1000 mg via ORAL
  Filled 2015-12-27 (×5): qty 2

## 2015-12-27 MED ORDER — METHOCARBAMOL 500 MG PO TABS: 500.0000 mg | ORAL_TABLET | Freq: Four times a day (QID) | ORAL | Status: DC | PRN

## 2015-12-27 MED ORDER — IRBESARTAN 150 MG PO TABS
150.0000 mg | ORAL_TABLET | Freq: Every day | ORAL | Status: DC
  Administered 2015-12-28 – 2015-12-29 (×2): 150 mg via ORAL
  Filled 2015-12-27 (×2): qty 1

## 2015-12-27 MED ORDER — RIVAROXABAN 10 MG PO TABS
10.0000 mg | ORAL_TABLET | Freq: Every day | ORAL | Status: DC
  Administered 2015-12-28 – 2015-12-29 (×2): 10 mg via ORAL
  Filled 2015-12-27 (×3): qty 1

## 2015-12-27 MED ORDER — BUPIVACAINE HCL (PF) 0.25 % IJ SOLN
INTRAMUSCULAR | Status: AC
  Filled 2015-12-27: qty 30

## 2015-12-27 MED ORDER — SODIUM CHLORIDE 0.9 % IV SOLN: INTRAVENOUS | Status: DC

## 2015-12-27 MED ORDER — DIPHENHYDRAMINE HCL 12.5 MG/5ML PO ELIX: 12.5000 mg | ORAL_SOLUTION | ORAL | Status: DC | PRN

## 2015-12-27 MED ORDER — DOCUSATE SODIUM 100 MG PO CAPS
100.0000 mg | ORAL_CAPSULE | Freq: Two times a day (BID) | ORAL | Status: DC
  Administered 2015-12-27 – 2015-12-29 (×4): 100 mg via ORAL
  Filled 2015-12-27 (×3): qty 1

## 2015-12-27 MED ORDER — OXYCODONE HCL 5 MG PO TABS
5.0000 mg | ORAL_TABLET | ORAL | Status: DC | PRN
  Administered 2015-12-27 – 2015-12-28 (×4): 10 mg via ORAL
  Filled 2015-12-27 (×4): qty 2

## 2015-12-27 MED ORDER — BUPIVACAINE HCL 0.25 % IJ SOLN
INTRAMUSCULAR | Status: DC | PRN
  Administered 2015-12-27: 20 mL

## 2015-12-27 MED ORDER — BUPIVACAINE LIPOSOME 1.3 % IJ SUSP
INTRAMUSCULAR | Status: DC | PRN
  Administered 2015-12-27: 20 mL

## 2015-12-27 MED ORDER — PHENOL 1.4 % MT LIQD: 1.0000 | OROMUCOSAL | Status: DC | PRN

## 2015-12-27 MED ORDER — PROPOFOL 500 MG/50ML IV EMUL
INTRAVENOUS | Status: DC | PRN
  Administered 2015-12-27: 100 ug/kg/min via INTRAVENOUS

## 2015-12-27 MED ORDER — LIDOCAINE HCL (CARDIAC) 20 MG/ML IV SOLN
INTRAVENOUS | Status: AC
Start: 2015-12-27 — End: 2015-12-27
  Filled 2015-12-27: qty 5

## 2015-12-27 MED ORDER — PROPOFOL 500 MG/50ML IV EMUL
INTRAVENOUS | Status: DC | PRN
  Administered 2015-12-27: 30 mg via INTRAVENOUS

## 2015-12-27 MED ORDER — LACTATED RINGERS IV SOLN
INTRAVENOUS | Status: DC
  Administered 2015-12-27: 09:00:00 via INTRAVENOUS
  Administered 2015-12-27: 1000 mL via INTRAVENOUS
  Administered 2015-12-27: 09:00:00 via INTRAVENOUS

## 2015-12-27 MED ORDER — METHOCARBAMOL 1000 MG/10ML IJ SOLN
500.0000 mg | Freq: Four times a day (QID) | INTRAMUSCULAR | Status: DC | PRN
  Administered 2015-12-27: 500 mg via INTRAVENOUS
  Filled 2015-12-27: qty 550
  Filled 2015-12-27: qty 5

## 2015-12-27 MED ORDER — CEFAZOLIN SODIUM-DEXTROSE 2-4 GM/100ML-% IV SOLN
2.0000 g | INTRAVENOUS | Status: AC
  Administered 2015-12-27: 2 g via INTRAVENOUS
  Filled 2015-12-27: qty 100

## 2015-12-27 MED ORDER — CHLORHEXIDINE GLUCONATE 4 % EX LIQD: 60.0000 mL | Freq: Once | CUTANEOUS | Status: DC

## 2015-12-27 MED ORDER — ONDANSETRON HCL 4 MG/2ML IJ SOLN
INTRAMUSCULAR | Status: AC
  Filled 2015-12-27: qty 2

## 2015-12-27 MED ORDER — TRANEXAMIC ACID 1000 MG/10ML IV SOLN
1000.0000 mg | Freq: Once | INTRAVENOUS | Status: AC
  Administered 2015-12-27: 1000 mg via INTRAVENOUS
  Filled 2015-12-27: qty 10

## 2015-12-27 MED ORDER — HYDROCHLOROTHIAZIDE 12.5 MG PO CAPS
12.5000 mg | ORAL_CAPSULE | Freq: Every day | ORAL | Status: DC
  Administered 2015-12-28 – 2015-12-29 (×2): 12.5 mg via ORAL
  Filled 2015-12-27 (×2): qty 1

## 2015-12-27 MED ORDER — SODIUM CHLORIDE 0.9 % IJ SOLN
INTRAMUSCULAR | Status: AC
  Filled 2015-12-27: qty 10

## 2015-12-27 MED ORDER — MIDAZOLAM HCL 2 MG/2ML IJ SOLN
INTRAMUSCULAR | Status: AC
  Filled 2015-12-27: qty 2

## 2015-12-27 MED ORDER — FLEET ENEMA 7-19 GM/118ML RE ENEM: 1.0000 | ENEMA | Freq: Once | RECTAL | Status: DC | PRN

## 2015-12-27 MED ORDER — CEFAZOLIN SODIUM-DEXTROSE 2-4 GM/100ML-% IV SOLN
INTRAVENOUS | Status: AC
Start: 2015-12-27 — End: 2015-12-27
  Filled 2015-12-27: qty 100

## 2015-12-27 MED ORDER — METOCLOPRAMIDE HCL 5 MG/ML IJ SOLN
5.0000 mg | Freq: Three times a day (TID) | INTRAMUSCULAR | Status: DC | PRN
Start: 2015-12-27 — End: 2015-12-29

## 2015-12-27 MED ORDER — SODIUM CHLORIDE 0.9 % IV SOLN
INTRAVENOUS | Status: DC
  Administered 2015-12-27 – 2015-12-28 (×2): via INTRAVENOUS

## 2015-12-27 MED ORDER — METOCLOPRAMIDE HCL 10 MG PO TABS: 5.0000 mg | ORAL_TABLET | Freq: Three times a day (TID) | ORAL | Status: DC | PRN

## 2015-12-27 MED ORDER — ACETAMINOPHEN 325 MG PO TABS: 650.0000 mg | ORAL_TABLET | Freq: Four times a day (QID) | ORAL | Status: DC | PRN

## 2015-12-27 MED ORDER — BISACODYL 10 MG RE SUPP
10.0000 mg | Freq: Every day | RECTAL | Status: DC | PRN
Start: 2015-12-27 — End: 2015-12-29

## 2015-12-27 MED ORDER — ONDANSETRON HCL 4 MG/2ML IJ SOLN: 4.0000 mg | Freq: Four times a day (QID) | INTRAMUSCULAR | Status: DC | PRN

## 2015-12-27 MED ORDER — HYDROMORPHONE HCL 1 MG/ML IJ SOLN: 0.2500 mg | INTRAMUSCULAR | Status: DC | PRN

## 2015-12-27 MED ORDER — SODIUM CHLORIDE 0.9 % IJ SOLN
INTRAMUSCULAR | Status: AC
  Filled 2015-12-27: qty 50

## 2015-12-27 MED ORDER — BUPIVACAINE LIPOSOME 1.3 % IJ SUSP
20.0000 mL | Freq: Once | INTRAMUSCULAR | Status: DC
  Filled 2015-12-27: qty 20

## 2015-12-27 MED ORDER — PROPOFOL 10 MG/ML IV BOLUS
INTRAVENOUS | Status: AC
  Filled 2015-12-27: qty 40

## 2015-12-27 MED ORDER — SODIUM CHLORIDE 0.9 % IJ SOLN
INTRAMUSCULAR | Status: DC | PRN
  Administered 2015-12-27: 30 mL

## 2015-12-27 MED ORDER — CEFAZOLIN SODIUM-DEXTROSE 2-4 GM/100ML-% IV SOLN
2.0000 g | Freq: Four times a day (QID) | INTRAVENOUS | Status: AC
  Administered 2015-12-27 (×2): 2 g via INTRAVENOUS
  Filled 2015-12-27 (×2): qty 100

## 2015-12-27 MED ORDER — ONDANSETRON HCL 4 MG PO TABS: 4.0000 mg | ORAL_TABLET | Freq: Four times a day (QID) | ORAL | Status: DC | PRN

## 2015-12-27 SURGICAL SUPPLY — 52 items
BAG DECANTER FOR FLEXI CONT (MISCELLANEOUS) ×3 IMPLANT
BAG ZIPLOCK 12X15 (MISCELLANEOUS) ×3 IMPLANT
BANDAGE ACE 6X5 VEL STRL LF (GAUZE/BANDAGES/DRESSINGS) IMPLANT
BANDAGE ELASTIC 6 VELCRO ST LF (GAUZE/BANDAGES/DRESSINGS) ×3 IMPLANT
BLADE SAG 18X100X1.27 (BLADE) ×3 IMPLANT
BLADE SAW SGTL 11.0X1.19X90.0M (BLADE) ×3 IMPLANT
BOWL SMART MIX CTS (DISPOSABLE) ×3 IMPLANT
CAP KNEE TOTAL 3 SIGMA ×3 IMPLANT
CEMENT HV SMART SET (Cement) ×6 IMPLANT
CLOSURE WOUND 1/2 X4 (GAUZE/BANDAGES/DRESSINGS) ×1
CLOTH BEACON ORANGE TIMEOUT ST (SAFETY) ×3 IMPLANT
CUFF TOURN SGL QUICK 34 (TOURNIQUET CUFF) ×2
CUFF TRNQT CYL 34X4X40X1 (TOURNIQUET CUFF) ×1 IMPLANT
DECANTER SPIKE VIAL GLASS SM (MISCELLANEOUS) ×3 IMPLANT
DRAPE U-SHAPE 47X51 STRL (DRAPES) ×3 IMPLANT
DRSG ADAPTIC 3X8 NADH LF (GAUZE/BANDAGES/DRESSINGS) ×3 IMPLANT
DRSG PAD ABDOMINAL 8X10 ST (GAUZE/BANDAGES/DRESSINGS) IMPLANT
DURAPREP 26ML APPLICATOR (WOUND CARE) ×3 IMPLANT
ELECT REM PT RETURN 9FT ADLT (ELECTROSURGICAL) ×3
ELECTRODE REM PT RTRN 9FT ADLT (ELECTROSURGICAL) ×1 IMPLANT
EVACUATOR 1/8 PVC DRAIN (DRAIN) ×3 IMPLANT
GAUZE SPONGE 4X4 12PLY STRL (GAUZE/BANDAGES/DRESSINGS) ×3 IMPLANT
GLOVE BIO SURGEON STRL SZ7.5 (GLOVE) IMPLANT
GLOVE BIO SURGEON STRL SZ8 (GLOVE) ×3 IMPLANT
GLOVE BIOGEL PI IND STRL 6.5 (GLOVE) IMPLANT
GLOVE BIOGEL PI IND STRL 8 (GLOVE) ×1 IMPLANT
GLOVE BIOGEL PI INDICATOR 6.5 (GLOVE)
GLOVE BIOGEL PI INDICATOR 8 (GLOVE) ×2
GLOVE SURG SS PI 6.5 STRL IVOR (GLOVE) IMPLANT
GOWN STRL REUS W/TWL LRG LVL3 (GOWN DISPOSABLE) ×3 IMPLANT
GOWN STRL REUS W/TWL XL LVL3 (GOWN DISPOSABLE) IMPLANT
HANDPIECE INTERPULSE COAX TIP (DISPOSABLE) ×2
IMMOBILIZER KNEE 20 (SOFTGOODS) ×3 IMPLANT
IMMOBILIZER KNEE 20 THIGH 36 (SOFTGOODS) IMPLANT
MANIFOLD NEPTUNE II (INSTRUMENTS) ×3 IMPLANT
NS IRRIG 1000ML POUR BTL (IV SOLUTION) ×3 IMPLANT
PACK TOTAL KNEE CUSTOM (KITS) ×3 IMPLANT
PAD ABD 8X10 STRL (GAUZE/BANDAGES/DRESSINGS) ×3 IMPLANT
PADDING CAST COTTON 6X4 STRL (CAST SUPPLIES) ×3 IMPLANT
POSITIONER SURGICAL ARM (MISCELLANEOUS) ×3 IMPLANT
SET HNDPC FAN SPRY TIP SCT (DISPOSABLE) ×1 IMPLANT
STRIP CLOSURE SKIN 1/2X4 (GAUZE/BANDAGES/DRESSINGS) ×2 IMPLANT
SUT MNCRL AB 4-0 PS2 18 (SUTURE) ×3 IMPLANT
SUT VIC AB 2-0 CT1 27 (SUTURE) ×6
SUT VIC AB 2-0 CT1 TAPERPNT 27 (SUTURE) ×3 IMPLANT
SUT VLOC 180 0 24IN GS25 (SUTURE) ×3 IMPLANT
SYR 50ML LL SCALE MARK (SYRINGE) ×3 IMPLANT
TRAY FOLEY W/METER SILVER 14FR (SET/KITS/TRAYS/PACK) IMPLANT
TRAY FOLEY W/METER SILVER 16FR (SET/KITS/TRAYS/PACK) ×3 IMPLANT
WATER STERILE IRR 1500ML POUR (IV SOLUTION) ×3 IMPLANT
WRAP KNEE MAXI GEL POST OP (GAUZE/BANDAGES/DRESSINGS) ×3 IMPLANT
YANKAUER SUCT BULB TIP 10FT TU (MISCELLANEOUS) ×3 IMPLANT

## 2015-12-27 NOTE — Evaluation (Signed)
Physical Therapy Evaluation Patient Details Name: Daniel Yu MRN: [id number] DOB: 1932-05-21 Today's Date: 12/27/2015   History of Present Illness  [name], h/o L quad tendon repair 05/2015  Clinical Impression  Pt is s/p TKA resulting in the deficits listed below (see PT Problem List). Pt ambulated 5' with RW and performed TKA exercises with min A. Excellent progress expected.  Pt will benefit from skilled PT to increase their independence and safety with mobility to allow discharge to the venue listed below.      Follow Up Recommendations Home health PT    Equipment Recommendations  None recommended by PT    Recommendations for Other Services OT consult     Precautions / Restrictions Precautions Precautions: Knee Restrictions Weight Bearing Restrictions: No Other Position/Activity Restrictions: WBAT      Mobility  Bed Mobility Overal bed mobility: Modified Independent Bed Mobility: Supine to Sit     Supine to sit: Modified independent (Device/Increase time);HOB elevated     General bed mobility comments: no physical assist  Transfers Overall transfer level: Needs assistance Equipment used: Rolling walker (2 wheeled) Transfers: Sit to/from Stand Sit to Stand: Min assist         General transfer comment: VCs hand placement, min A to rise/steady  Ambulation/Gait Ambulation/Gait assistance: Min guard Ambulation Distance (Feet): 40 Feet Assistive device: Rolling walker (2 wheeled) Gait Pattern/deviations: Step-to pattern   Gait velocity interpretation: Below normal speed for age/gender General Gait Details: VCs sequencing, no LOB  Stairs            Wheelchair Mobility    Modified Rankin (Stroke Patients Only)       Balance Overall balance assessment: Modified Independent                                           Pertinent Vitals/Pain Pain Assessment: 0-10 Pain Score: 2  Pain Location: R knee with walking Pain  Descriptors / Indicators: Sore Pain Intervention(s): Limited activity within patient's tolerance;Monitored during session;Premedicated before session;Ice applied    Home Living Family/patient expects to be discharged to:: Private residence Living Arrangements: Spouse/significant other Available Help at Discharge: Family;Available 24 hours/day Type of Home: House Home Access: Stairs to enter Entrance Stairs-Rails: None   Home Layout: Two level;1/2 bath on main level Home Equipment: Crutches;Walker - 2 wheels;Toilet riser      Prior Function Level of Independence: Independent               Hand Dominance        Extremity/Trunk Assessment   Upper Extremity Assessment: Overall WFL for tasks assessed (reports he needs carpal tunnel surgery)           Lower Extremity Assessment: RLE deficits/detail RLE Deficits / Details: 5-50* AAROM R knee, SLR 3/5    Cervical / Trunk Assessment: Normal  Communication   Communication: No difficulties  Cognition Arousal/Alertness: Awake/alert Behavior During Therapy: WFL for tasks assessed/performed Overall Cognitive Status: Within Functional Limits for tasks assessed                      General Comments      Exercises Total Joint Exercises Ankle Circles/Pumps: AROM;Both;10 reps Quad Sets: AROM;Right;10 reps;Supine Heel Slides: AAROM;Right;10 reps;Supine Straight Leg Raises: AROM;Right;5 reps;Supine Goniometric ROM: 5-50* AAROM R knee      Assessment/Plan    PT Assessment Patient needs continued  PT services  PT Diagnosis Difficulty walking;Acute pain   PT Problem List Decreased strength;Decreased range of motion;Decreased activity tolerance;Decreased balance;Pain;Decreased knowledge of use of DME;Decreased mobility  PT Treatment Interventions DME instruction;Gait training;Stair training;Functional mobility training;Therapeutic activities;Patient/family education;Balance training;Therapeutic exercise   PT Goals  (Current goals can be found in the Care Plan section) Acute Rehab PT Goals Patient Stated Goal: walk long distances PT Goal Formulation: With patient/family Time For Goal Achievement: 01/03/16 Potential to Achieve Goals: Good    Frequency 7X/week   Barriers to discharge        Co-evaluation               End of Session Equipment Utilized During Treatment: Gait belt Activity Tolerance: Patient tolerated treatment well Patient left: in chair;with call bell/phone within reach;with chair alarm set;with family/visitor present Nurse Communication: Mobility status         Time: [id number] PT Time Calculation (min) (ACUTE ONLY): 33 min   Charges:   PT Evaluation $PT Eval Low Complexity: 1 Procedure PT Treatments $Gait Training: 8-22 mins   PT G Codes:        Philomena Doheny 12/27/2015, 4:54 PM [phone]

## 2015-12-27 NOTE — Interval H&P Note (Signed)
History and Physical Interval Note:  12/27/2015 9:16 AM  Daniel Yu  has presented today for surgery, with the diagnosis of right knee osteoarthritis  The various methods of treatment have been discussed with the patient and family. After consideration of risks, benefits and other options for treatment, the patient has consented to  Procedure(s): RIGHT TOTAL KNEE ARTHROPLASTY (Right) as a surgical intervention .  The patient's history has been reviewed, patient examined, no change in status, stable for surgery.  I have reviewed the patient's chart and labs.  Questions were answered to the patient's satisfaction.     Gearlean Alf

## 2015-12-27 NOTE — Op Note (Signed)
Pre-operative diagnosis- Osteoarthritis  Right knee(s)  Post-operative diagnosis- Osteoarthritis Right knee(s)  Procedure-  Right  Total Knee Arthroplasty  Surgeon- Dione Plover. Welden Hausmann, MD  Assistant- Arlee Muslim, PA-C   Anesthesia-  Spinal  EBL-* No blood loss amount entered *   Drains Hemovac  Tourniquet time-  Total Tourniquet Time Documented: Thigh (Right) - 35 minutes Total: Thigh (Right) - 35 minutes     Complications- None  Condition-PACU - hemodynamically stable.   Brief Clinical Note  Daniel Yu is a 82 y.o. year old male with end stage OA of his right knee with progressively worsening pain and dysfunction. He has constant pain, with activity and at rest and significant functional deficits with difficulties even with ADLs. He has had extensive non-op management including analgesics, injections of cortisone and viscosupplements, and home exercise program, but remains in significant pain with significant dysfunction. Radiographs show bone on bone arthritis medial and patellofemoral. He presents now for right Total Knee Arthroplasty.    Procedure in detail---   The patient is brought into the operating room and positioned supine on the operating table. After successful administration of  Spinal,   a tourniquet is placed high on the  Right thigh(s) and the lower extremity is prepped and draped in the usual sterile fashion. Time out is performed by the operating team and then the  Right lower extremity is wrapped in Esmarch, knee flexed and the tourniquet inflated to 300 mmHg.       A midline incision is made with a ten blade through the subcutaneous tissue to the level of the extensor mechanism. A fresh blade is used to make a medial parapatellar arthrotomy. Soft tissue over the proximal medial tibia is subperiosteally elevated to the joint line with a knife and into the semimembranosus bursa with a Cobb elevator. Soft tissue over the proximal lateral tibia is elevated with  attention being paid to avoiding the patellar tendon on the tibial tubercle. The patella is everted, knee flexed 90 degrees and the ACL and PCL are removed. Findings are bone on bone medial and patellofemoral with massive global osteophytes.        The drill is used to create a starting hole in the distal femur and the canal is thoroughly irrigated with sterile saline to remove the fatty contents. The 5 degree Right  valgus alignment guide is placed into the femoral canal and the distal femoral cutting block is pinned to remove 10 mm off the distal femur. Resection is made with an oscillating saw.      The tibia is subluxed forward and the menisci are removed. The extramedullary alignment guide is placed referencing proximally at the medial aspect of the tibial tubercle and distally along the second metatarsal axis and tibial crest. The block is pinned to remove 51mm off the more deficient medial  side. Resection is made with an oscillating saw. Size 5is the most appropriate size for the tibia and the proximal tibia is prepared with the modular drill and keel punch for that size.      The femoral sizing guide is placed and size 5 is most appropriate. Rotation is marked off the epicondylar axis and confirmed by creating a rectangular flexion gap at 90 degrees. The size 5 cutting block is pinned in this rotation and the anterior, posterior and chamfer cuts are made with the oscillating saw. The intercondylar block is then placed and that cut is made.      Trial size 5 tibial component,  trial size 5 posterior stabilized femur and a 12.5  mm posterior stabilized rotating platform insert trial is placed. Full extension is achieved with excellent varus/valgus and anterior/posterior balance throughout full range of motion. The patella is everted and thickness measured to be 27  mm. Free hand resection is taken to 15 mm, a 41 template is placed, lug holes are drilled, trial patella is placed, and it tracks normally.  Osteophytes are removed off the posterior femur with the trial in place. All trials are removed and the cut bone surfaces prepared with pulsatile lavage. Cement is mixed and once ready for implantation, the size 5 tibial implant, size  5 posterior stabilized femoral component, and the size 41 patella are cemented in place and the patella is held with the clamp. The trial insert is placed and the knee held in full extension. The Exparel (20 ml mixed with 30 ml saline) and .25% Bupivicaine, are injected into the extensor mechanism, posterior capsule, medial and lateral gutters and subcutaneous tissues.  All extruded cement is removed and once the cement is hard the permanent 12.5 mm posterior stabilized rotating platform insert is placed into the tibial tray.      The wound is copiously irrigated with saline solution and the extensor mechanism closed over a hemovac drain with #1 V-loc suture. The tourniquet is released for a total tourniquet time of 35  minutes. Flexion against gravity is 140 degrees and the patella tracks normally. Subcutaneous tissue is closed with 2.0 vicryl and subcuticular with running 4.0 Monocryl. The incision is cleaned and dried and steri-strips and a bulky sterile dressing are applied. The limb is placed into a knee immobilizer and the patient is awakened and transported to recovery in stable condition.      Please note that a surgical assistant was a medical necessity for this procedure in order to perform it in a safe and expeditious manner. Surgical assistant was necessary to retract the ligaments and vital neurovascular structures to prevent injury to them and also necessary for proper positioning of the limb to allow for anatomic placement of the prosthesis.   Dione Plover Jeremih Dearmas, MD    12/27/2015, 11:13 AM

## 2015-12-27 NOTE — Anesthesia Postprocedure Evaluation (Signed)
Anesthesia Post Note  Patient: OZIAH BOBST  Procedure(s) Performed: Procedure(s) (LRB): RIGHT TOTAL KNEE ARTHROPLASTY (Right)  Patient location during evaluation: PACU Anesthesia Type: Spinal Level of consciousness: awake and alert Pain management: pain level controlled Vital Signs Assessment: post-procedure vital signs reviewed and stable Respiratory status: spontaneous breathing, nonlabored ventilation, respiratory function stable and patient connected to nasal cannula oxygen Cardiovascular status: blood pressure returned to baseline and stable Postop Assessment: no signs of nausea or vomiting Anesthetic complications: no    Last Vitals:  Filed Vitals:   12/27/15 0731 12/27/15 1151  BP: 126/54 100/68  Pulse: 98 73  Temp: 36.4 C 36.6 C  Resp: 16 17    Last Pain:  Filed Vitals:   12/27/15 1206  PainSc: 0-No pain                 Darlin Stenseth S

## 2015-12-27 NOTE — H&P (View-Only) (Signed)
Daniel Yu  DOB: 08-31-1931 Married / Language: Cleophus Yu / Race: White Male  Date of Admission:  12/27/2015 CC:  Right Knee Pain  History of Present Illness  The patient is a 82 year old male who comes in  for a preoperative History and Physical. The patient is scheduled for a right total knee arthroplasty to be performed by Dr. Dione Plover. [name], MD at [facility] on 12-27-2015. The patient was seen for a second opinion. The patient reports right knee symptoms including: pain which began year(s) ago following a specific injury. The patient describes the severity of the symptoms as mild. The patient describes their pain as aching.The patient feels that the symptoms are worsening. The patient has the current diagnosis of knee osteoarthritis. Prior to being seen, the patient was previously evaluated by Dr.Peter Dalldorf month(s) ago. Previous work-up for this problem has included knee x-rays and orthopedic consultation Research scientist (life sciences)). Past treatment for this problem has included intra-articular injection of corticosteroids (cortisone and Supartz). Symptoms are reported to be located in the right knee and include knee pain. The patient does not report any radiation of symptoms. Onset of symptoms was gradual with symptoms now occurring frequently. Symptoms are exacerbated by walking (long distance). Current treatment includes nonsteroidal anti-inflammatory drugs (Aleve (prn)). Note for "Knee pain": Knee is limiting his activity level. Loves to walk long distance. [name] is an extremely active man and wishes to continue being active. The knee has been holding him up and preventing him from doing a lot of what he desires. He states that the knee hurts with most activities. He has had cortisone and viscosupplement injections without much benefit. He used to walk four miles a day and would like to get back to doing that if possible. He is ready to proceed with surgery at this time. They have  been treated conservatively in the past for the above stated problem and despite conservative measures, they continue to have progressive pain and severe functional limitations and dysfunction. They have failed non-operative management including home exercise, medications, and injections. It is felt that they would benefit from undergoing total joint replacement. Risks and benefits of the procedure have been discussed with the patient and they elect to proceed with surgery. There are no active contraindications to surgery such as ongoing infection or rapidly progressive neurological disease.    Problem List/Past Medical  Problems Reconciled  Strain of left quadriceps muscle, fascia and tendon, subsequent encounter [id number])  Traumatic rupture Chronic pain of right knee (M25.561)  Encounter for care following Quadriceps Tendon Repair (Z47.89)  Primary osteoarthritis of right knee (M17.11)  Left carpal tunnel syndrome (G56.02)  Heart murmur  High blood pressure  Impaired Vision  Tinnitus  Allergies No Known Drug Allergies   Family History Cancer  sister Cerebrovascular Accident  sister Heart Disease  sister Hypertension  sister  Social History  Number of flights of stairs before winded  greater than 5 Marital status  married Living situation  live with spouse Tobacco use  never smoker Tobacco / smoke exposure  no Pain Contract  no Illicit drug use  no Current work status  retired Children  2 Alcohol use  current drinker; drinks beer and wine; 8-14 per week Exercise  Exercises weekly; does running / walking and individual sport Drug/Alcohol Rehab (Previously)  no Drug/Alcohol Rehab (Currently)  no Advance Directives  Healthcare POA Post-Surgical Plans  Home  Medication History Aspirin (81MG  Tablet, Oral) Active. Valsartan-Hydrochlorothiazide (160-12.5MG  Tablet, Oral) Active. Aleve (  prn) Active.  Past Surgical History  Inguinal Hernia Repair   open: left Vasectomy  Left Knee Quad Tendon Repair    Review of Systems  General Not Present- Chills, Fatigue, Fever, Memory Loss, Night Sweats, Weight Gain and Weight Loss. Skin Not Present- Eczema, Hives, Itching, Lesions and Rash. HEENT Not Present- Dentures, Double Vision, Headache, Hearing Loss, Tinnitus and Visual Loss. Respiratory Not Present- Allergies, Chronic Cough, Coughing up blood, Shortness of breath at rest and Shortness of breath with exertion. Cardiovascular Not Present- Chest Pain, Difficulty Breathing Lying Down, Murmur, Palpitations, Racing/skipping heartbeats and Swelling. Gastrointestinal Not Present- Abdominal Pain, Bloody Stool, Constipation, Diarrhea, Difficulty Swallowing, Heartburn, Jaundice, Loss of appetitie, Nausea and Vomiting. Male Genitourinary Not Present- Blood in Urine, Discharge, Flank Pain, Incontinence, Painful Urination, Urgency, Urinary frequency, Urinary Retention, Urinating at Night and Weak urinary stream. Musculoskeletal Not Present- Back Pain, Joint Pain, Joint Swelling, Morning Stiffness, Muscle Pain, Muscle Weakness and Spasms. Neurological Not Present- Blackout spells, Difficulty with balance, Dizziness, Paralysis, Tremor and Weakness. Psychiatric Not Present- Insomnia.  Vitals  Weight: 190 lb Height: 71in Body Surface Area: 2.06 m Body Mass Index: 26.5 kg/m  Pulse: 56 (Irregular)  Resp.: 12 (Unlabored)  BP: 148/82 (Sitting, Left Arm, Standard)       Physical Exam General Mental Status -Alert, cooperative and good historian. General Appearance-pleasant, Not in acute distress. Orientation-Oriented X3. Build & Nutrition-Well nourished and Well developed.  Head and Neck Head-normocephalic, atraumatic . Neck Global Assessment - supple, no bruit auscultated on the right, no bruit auscultated on the left.  Eye Vision-Wears corrective lenses. Pupil - Bilateral-Regular and Round. Motion -  Bilateral-EOMI.  ENMT Note: bilateral hearing aids   Chest and Lung Exam Auscultation Breath sounds - clear at anterior chest wall and clear at posterior chest wall. Adventitious sounds - No Adventitious sounds.  Cardiovascular Auscultation Rhythm - Regular rate and rhythm. Heart Sounds - S1 WNL and S2 WNL. Murmurs & Other Heart Sounds - Auscultation of the heart reveals - No Murmurs.  Abdomen Palpation/Percussion Tenderness - Abdomen is non-tender to palpation. Rigidity (guarding) - Abdomen is soft. Auscultation Auscultation of the abdomen reveals - Bowel sounds normal.  Male Genitourinary Note: Not done, not pertinent to present illness   Musculoskeletal Note: Well developed male, in no distress. Evaluation of his hips show normal range of motion with no discomfort. His left knee shows no effusion. His range of motion of the left knee is about 0 to 135 with no tenderness or instability. Right knee slight varus, range about 5 to 130, moderate crepitus on range of motion, tenderness medial greater than lateral with no instability noted. Pulse, sensation, motor intact. He walks with an antalgic gait pattern on the right.  RADIOGRAPHS Radiographs taken today, AP both knees and lateral of the right, show bone on bone arthritis in the medial and patellofemoral compartments of the right knee. His left knee looks normal.   Assessment & Plan  Primary osteoarthritis of right knee (M17.11)  Note:Surgical Plans: Right Total Knee Replacement  Disposition: Home  PCP: Dr. Lavone Orn - Patient has been seen preoperatively and felt to be stable for surgery.  IV TXA  Anesthesia Issues: None  Signed electronically by Ok Edwards, III PA-C

## 2015-12-27 NOTE — Transfer of Care (Signed)
Immediate Anesthesia Transfer of Care Note  Patient: Daniel Yu  Procedure(s) Performed: Procedure(s): RIGHT TOTAL KNEE ARTHROPLASTY (Right)  Patient Location: PACU  Anesthesia Type:Spinal  Level of Consciousness: awake, alert  and oriented  Airway & Oxygen Therapy: Patient Spontanous Breathing and Patient connected to face mask oxygen  Post-op Assessment: Report given to RN and Post -op Vital signs reviewed and stable  Post vital signs: Reviewed and stable  Last Vitals:  Filed Vitals:   12/27/15 0731  BP: 126/54  Pulse: 98  Temp: 36.4 C  Resp: 16    Last Pain: There were no vitals filed for this visit.       Complications: No apparent anesthesia complications

## 2015-12-27 NOTE — Anesthesia Preprocedure Evaluation (Addendum)
Anesthesia Evaluation  Patient identified by MRN, date of birth, ID band Patient awake    Reviewed: Allergy & Precautions, NPO status , Patient's Chart, lab work & pertinent test results  Airway Mallampati: II  TM Distance: >3 FB Neck ROM: Full    Dental no notable dental hx.    Pulmonary neg pulmonary ROS,    Pulmonary exam normal breath sounds clear to auscultation       Cardiovascular hypertension, Pt. on medications Normal cardiovascular exam Rhythm:Regular Rate:Normal     Neuro/Psych negative neurological ROS  negative psych ROS   GI/Hepatic negative GI ROS, Neg liver ROS,   Endo/Other  negative endocrine ROS  Renal/GU negative Renal ROS  negative genitourinary   Musculoskeletal negative musculoskeletal ROS (+)   Abdominal   Peds negative pediatric ROS (+)  Hematology negative hematology ROS (+)   Anesthesia Other Findings   Reproductive/Obstetrics negative OB ROS                            Anesthesia Physical Anesthesia Plan  ASA: II  Anesthesia Plan: Spinal   Post-op Pain Management:    Induction: Intravenous  Airway Management Planned: Simple Face Mask  Additional Equipment:   Intra-op Plan:   Post-operative Plan:   Informed Consent: I have reviewed the patients History and Physical, chart, labs and discussed the procedure including the risks, benefits and alternatives for the proposed anesthesia with the patient or authorized representative who has indicated his/her understanding and acceptance.   Dental advisory given  Plan Discussed with: CRNA and Surgeon  Anesthesia Plan Comments:         Anesthesia Quick Evaluation

## 2015-12-27 NOTE — Progress Notes (Signed)
Utilization review completed.  

## 2015-12-28 LAB — CBC
HEMATOCRIT: 31.8 % — AB (ref 39.0–52.0)
HEMOGLOBIN: 11.2 g/dL — AB (ref 13.0–17.0)
MCH: 32.8 pg (ref 26.0–34.0)
MCHC: 35.2 g/dL (ref 30.0–36.0)
MCV: 93.3 fL (ref 78.0–100.0)
Platelets: 260 10*3/uL (ref 150–400)
RBC: 3.41 MIL/uL — AB (ref 4.22–5.81)
RDW: 13 % (ref 11.5–15.5)
WBC: 10.2 10*3/uL (ref 4.0–10.5)

## 2015-12-28 LAB — BASIC METABOLIC PANEL
ANION GAP: 9 (ref 5–15)
BUN: 19 mg/dL (ref 6–20)
CHLORIDE: 106 mmol/L (ref 101–111)
CO2: 19 mmol/L — AB (ref 22–32)
Calcium: 8.2 mg/dL — ABNORMAL LOW (ref 8.9–10.3)
Creatinine, Ser: 0.88 mg/dL (ref 0.61–1.24)
GFR calc non Af Amer: >60 mL/min (ref >60)
Glucose, Bld: 121 mg/dL — ABNORMAL HIGH (ref 65–99)
POTASSIUM: 4.2 mmol/L (ref 3.5–5.1)
Sodium: 134 mmol/L — ABNORMAL LOW (ref 135–145)

## 2015-12-28 MED ORDER — CYCLOBENZAPRINE HCL 10 MG PO TABS: 10.0000 mg | ORAL_TABLET | Freq: Three times a day (TID) | ORAL | Status: DC | PRN

## 2015-12-28 MED ORDER — HYDROMORPHONE HCL 2 MG PO TABS
2.0000 mg | ORAL_TABLET | ORAL | Status: DC | PRN
  Administered 2015-12-28 (×4): 2 mg via ORAL
  Administered 2015-12-29: 4 mg via ORAL
  Administered 2015-12-29 (×2): 2 mg via ORAL
  Filled 2015-12-28 (×4): qty 1
  Filled 2015-12-28: qty 2
  Filled 2015-12-28 (×2): qty 1

## 2015-12-28 MED ORDER — TRAMADOL HCL 50 MG PO TABS: 50.0000 mg | ORAL_TABLET | Freq: Four times a day (QID) | ORAL | Status: DC | PRN

## 2015-12-28 MED ORDER — HYDROMORPHONE HCL 2 MG PO TABS: 2.0000 mg | ORAL_TABLET | ORAL | Status: DC | PRN

## 2015-12-28 MED ORDER — RIVAROXABAN 10 MG PO TABS: 10.0000 mg | ORAL_TABLET | Freq: Every day | ORAL | Status: DC

## 2015-12-28 NOTE — Discharge Summary (Signed)
Physician Discharge Summary   Patient ID: CHELSEY KIMBERLEY MRN: [id number] DOB/AGE: 1932-06-26 82 y.o.  Admit date: 12/27/2015 Discharge date: 12-29-15  Primary Diagnosis:  Osteoarthritis Right knee(s  Admission Diagnoses:  Past Medical History  Diagnosis Date  . Hypertension   . Asthma   . Arthritis     right knee oa  . Carpal tunnel syndrome     BILATERAL   Discharge Diagnoses:   Principal Problem:   OA (osteoarthritis) of knee  Estimated body mass index is 27.91 kg/(m^2) as calculated from the following:   Height as of this encounter: 5' 11"  (1.803 m).   Weight as of this encounter: 90.719 kg (200 lb).  Procedure:  Procedure(s) (LRB): RIGHT TOTAL KNEE ARTHROPLASTY (Right)   Consults: None  ONG:EXBMW KHALI ALBANESE is a 82 y.o. year old male with end stage OA of his right knee with progressively worsening pain and dysfunction. He has constant pain, with activity and at rest and significant functional deficits with difficulties even with ADLs. He has had extensive non-op management including analgesics, injections of cortisone and viscosupplements, and home exercise program, but remains in significant pain with significant dysfunction. Radiographs show bone on bone arthritis medial and patellofemoral. He presents now for right Total Knee Arthroplasty.    Laboratory Data: Admission on 12/27/2015  Component Date Value Ref Range Status  . ABO/RH(D) 12/27/2015 B POS   Final  . Antibody Screen 12/27/2015 NEG   Final  . Sample Expiration 12/27/2015 12/30/2015   Final  . ABO/RH(D) 12/27/2015 B POS   Final  . WBC 12/28/2015 10.2  4.0 - 10.5 K/uL Final  . RBC 12/28/2015 3.41* 4.22 - 5.81 MIL/uL Final  . Hemoglobin 12/28/2015 11.2* 13.0 - 17.0 g/dL Final  . HCT 12/28/2015 31.8* 39.0 - 52.0 % Final  . MCV 12/28/2015 93.3  78.0 - 100.0 fL Final  . MCH 12/28/2015 32.8  26.0 - 34.0 pg Final  . MCHC 12/28/2015 35.2  30.0 - 36.0 g/dL Final  . RDW 12/28/2015 13.0  11.5 - 15.5 % Final  .  Platelets 12/28/2015 260  150 - 400 K/uL Final  . Sodium 12/28/2015 134* 135 - 145 mmol/L Final  . Potassium 12/28/2015 4.2  3.5 - 5.1 mmol/L Final  . Chloride 12/28/2015 106  101 - 111 mmol/L Final  . CO2 12/28/2015 19* 22 - 32 mmol/L Final  . Glucose, Bld 12/28/2015 121* 65 - 99 mg/dL Final  . BUN 12/28/2015 19  6 - 20 mg/dL Final  . Creatinine, Ser 12/28/2015 0.88  0.61 - 1.24 mg/dL Final  . Calcium 12/28/2015 8.2* 8.9 - 10.3 mg/dL Final  . GFR calc non Af Amer 12/28/2015 >60  >60 mL/min Final  . GFR calc Af Amer 12/28/2015 >60  >60 mL/min Final   Comment: (NOTE) The eGFR has been calculated using the CKD EPI equation. This calculation has not been validated in all clinical situations. eGFR's persistently <60 mL/min signify possible Chronic Kidney Disease.   Georgiann Hahn gap 12/28/2015 9  5 - 15 Final  Hospital Outpatient Visit on 12/15/2015  Component Date Value Ref Range Status  . aPTT 12/15/2015 28  24 - 37 seconds Final  . WBC 12/15/2015 5.7  4.0 - 10.5 K/uL Final  . RBC 12/15/2015 4.08* 4.22 - 5.81 MIL/uL Final  . Hemoglobin 12/15/2015 14.2  13.0 - 17.0 g/dL Final  . HCT 12/15/2015 39.6  39.0 - 52.0 % Final  . MCV 12/15/2015 97.1  78.0 - 100.0 fL Final  .  MCH 12/15/2015 34.8* 26.0 - 34.0 pg Final  . MCHC 12/15/2015 35.9  30.0 - 36.0 g/dL Final  . RDW 12/15/2015 13.0  11.5 - 15.5 % Final  . Platelets 12/15/2015 290  150 - 400 K/uL Final  . Sodium 12/15/2015 132* 135 - 145 mmol/L Final  . Potassium 12/15/2015 4.6  3.5 - 5.1 mmol/L Final  . Chloride 12/15/2015 99* 101 - 111 mmol/L Final  . CO2 12/15/2015 24  22 - 32 mmol/L Final  . Glucose, Bld 12/15/2015 92  65 - 99 mg/dL Final  . BUN 12/15/2015 11  6 - 20 mg/dL Final  . Creatinine, Ser 12/15/2015 0.70  0.61 - 1.24 mg/dL Final  . Calcium 12/15/2015 9.0  8.9 - 10.3 mg/dL Final  . Total Protein 12/15/2015 7.2  6.5 - 8.1 g/dL Final  . Albumin 12/15/2015 4.3  3.5 - 5.0 g/dL Final  . AST 12/15/2015 21  15 - 41 U/L Final  . ALT  12/15/2015 17  17 - 63 U/L Final  . Alkaline Phosphatase 12/15/2015 57  38 - 126 U/L Final  . Total Bilirubin 12/15/2015 0.9  0.3 - 1.2 mg/dL Final  . GFR calc non Af Amer 12/15/2015 >60  >60 mL/min Final  . GFR calc Af Amer 12/15/2015 >60  >60 mL/min Final   Comment: (NOTE) The eGFR has been calculated using the CKD EPI equation. This calculation has not been validated in all clinical situations. eGFR's persistently <60 mL/min signify possible Chronic Kidney Disease.   . Anion gap 12/15/2015 9  5 - 15 Final  . Prothrombin Time 12/15/2015 14.2  11.6 - 15.2 seconds Final  . INR 12/15/2015 1.08  0.00 - 1.49 Final  . Color, Urine 12/15/2015 YELLOW  YELLOW Final  . APPearance 12/15/2015 CLEAR  CLEAR Final  . Specific Gravity, Urine 12/15/2015 1.016  1.005 - 1.030 Final  . pH 12/15/2015 7.5  5.0 - 8.0 Final  . Glucose, UA 12/15/2015 NEGATIVE  NEGATIVE mg/dL Final  . Hgb urine dipstick 12/15/2015 NEGATIVE  NEGATIVE Final  . Bilirubin Urine 12/15/2015 NEGATIVE  NEGATIVE Final  . Ketones, ur 12/15/2015 NEGATIVE  NEGATIVE mg/dL Final  . Protein, ur 12/15/2015 NEGATIVE  NEGATIVE mg/dL Final  . Nitrite 12/15/2015 NEGATIVE  NEGATIVE Final  . Leukocytes, UA 12/15/2015 NEGATIVE  NEGATIVE Final   MICROSCOPIC NOT DONE ON URINES WITH NEGATIVE PROTEIN, BLOOD, LEUKOCYTES, NITRITE, OR GLUCOSE <1000 mg/dL.  Marland Kitchen MRSA, PCR 12/15/2015 NEGATIVE  NEGATIVE Final  . Staphylococcus aureus 12/15/2015 NEGATIVE  NEGATIVE Final   Comment:        The Xpert SA Assay (FDA approved for NASAL specimens in patients over 56 years of age), is one component of a comprehensive surveillance program.  Test performance has been validated by [facility] for patients greater than or equal to 6 year old. It is not intended to diagnose infection nor to guide or monitor treatment.      X-Rays:No results found.  EKG: Orders placed or performed during the hospital encounter of 06/04/15  . EKG 12-Lead  . EKG 12-Lead      Hospital Course: REMON QUINTO is a 82 y.o. who was admitted to [facility]. They were brought to the operating room on 12/27/2015 and underwent Procedure(s): RIGHT TOTAL KNEE ARTHROPLASTY.  Patient tolerated the procedure well and was later transferred to the recovery room and then to the orthopaedic floor for postoperative care.  They were given PO and IV analgesics for pain control following their surgery.  They were given 24 hours of postoperative antibiotics of  Anti-infectives    Start     Dose/Rate Route Frequency Ordered Stop   12/27/15 1600  ceFAZolin (ANCEF) IVPB 2g/100 mL premix     2 g 200 mL/hr over 30 Minutes Intravenous Every 6 hours 12/27/15 1324 12/27/15 2145   12/27/15 0800  ceFAZolin (ANCEF) IVPB 2g/100 mL premix     2 g 200 mL/hr over 30 Minutes Intravenous On call to O.R. 12/27/15 0800 12/27/15 1007     and started on DVT prophylaxis in the form of Xarelto.   PT and OT were ordered for total joint protocol.  Discharge planning consulted to help with postop disposition and equipment needs.  Patient had a good night on the evening of surgery.  They started to get up OOB with therapy on day one. Hemovac drain was pulled without difficulty.  Continued to work with therapy into day two.  Dressing was changed on day two and the incision was healing well.  Patient was seen in rounds and was ready to go home on day two.  Discharge home with home health Diet - Cardiac diet Follow up - in 2 weeks Activity - WBAT Disposition - Home Condition Upon Discharge - Good D/C Meds - See DC Summary DVT Prophylaxis - Xarelto  Discharge Instructions    Call MD / Call 911    Complete by:  As directed   If you experience chest pain or shortness of breath, CALL 911 and be transported to the hospital emergency room.  If you develope a fever above 101 F, pus (white drainage) or increased drainage or redness at the wound, or calf pain, call your surgeon's office.     Change dressing     Complete by:  As directed   Change dressing daily with sterile 4 x 4 inch gauze dressing and apply TED hose. Do not submerge the incision under water.     Constipation Prevention    Complete by:  As directed   Drink plenty of fluids.  Prune juice may be helpful.  You may use a stool softener, such as Colace (over the counter) 100 mg twice a day.  Use MiraLax (over the counter) for constipation as needed.     Diet - low sodium heart healthy    Complete by:  As directed      Discharge instructions    Complete by:  As directed   Pick up stool softner and laxative for home use following surgery while on pain medications. Do not submerge incision under water. Please use good hand washing techniques while changing dressing each day. May shower starting three days after surgery. Please use a clean towel to pat the incision dry following showers. Continue to use ice for pain and swelling after surgery. Do not use any lotions or creams on the incision until instructed by your surgeon.  Take Xarelto for two and a half more weeks, then discontinue Xarelto. Once the patient has completed the blood thinner regimen, then take a Baby 81 mg Aspirin daily for three more weeks.  Postoperative Constipation Protocol  Constipation - defined medically as fewer than three stools per week and severe constipation as less than one stool per week.  One of the most common issues patients have following surgery is constipation.  Even if you have a regular bowel pattern at home, your normal regimen is likely to be disrupted due to multiple reasons following surgery.  Combination of anesthesia, postoperative narcotics,  change in appetite and fluid intake all can affect your bowels.  In order to avoid complications following surgery, here are some recommendations in order to help you during your recovery period.  Colace (docusate) - Pick up an over-the-counter form of Colace or another stool softener and take twice a day  as long as you are requiring postoperative pain medications.  Take with a full glass of water daily.  If you experience loose stools or diarrhea, hold the colace until you stool forms back up.  If your symptoms do not get better within 1 week or if they get worse, check with your doctor.  Dulcolax (bisacodyl) - Pick up over-the-counter and take as directed by the product packaging as needed to assist with the movement of your bowels.  Take with a full glass of water.  Use this product as needed if not relieved by Colace only.   MiraLax (polyethylene glycol) - Pick up over-the-counter to have on hand.  MiraLax is a solution that will increase the amount of water in your bowels to assist with bowel movements.  Take as directed and can mix with a glass of water, juice, soda, coffee, or tea.  Take if you go more than two days without a movement. Do not use MiraLax more than once per day. Call your doctor if you are still constipated or irregular after using this medication for 7 days in a row.  If you continue to have problems with postoperative constipation, please contact the office for further assistance and recommendations.  If you experience "the worst abdominal pain ever" or develop nausea or vomiting, please contact the office immediatly for further recommendations for treatment.     Do not put a pillow under the knee. Place it under the heel.    Complete by:  As directed      Do not sit on low chairs, stoools or toilet seats, as it may be difficult to get up from low surfaces    Complete by:  As directed      Driving restrictions    Complete by:  As directed   No driving until released by the physician.     Increase activity slowly as tolerated    Complete by:  As directed      Lifting restrictions    Complete by:  As directed   No lifting until released by the physician.     Patient may shower    Complete by:  As directed   You may shower without a dressing once there is no drainage.  Do  not wash over the wound.  If drainage remains, do not shower until drainage stops.     TED hose    Complete by:  As directed   Use stockings (TED hose) for 3 weeks on both leg(s).  You may remove them at night for sleeping.     Weight bearing as tolerated    Complete by:  As directed   Laterality:  right  Extremity:  Lower            Medication List    STOP taking these medications        ASCRIPTIN 325 MG Tabs     methocarbamol 500 MG tablet  Commonly known as:  ROBAXIN     oxyCODONE 5 MG immediate release tablet  Commonly known as:  Oxy IR/ROXICODONE      TAKE these medications        cyclobenzaprine 10 MG tablet  Commonly  known as:  FLEXERIL  Take 1 tablet (10 mg total) by mouth 3 (three) times daily as needed for muscle spasms.     fluticasone 110 MCG/ACT inhaler  Commonly known as:  FLOVENT HFA  Inhale 2 puffs into the lungs 2 (two) times daily.     HYDROmorphone 2 MG tablet  Commonly known as:  DILAUDID  Take 1-2 tablets (2-4 mg total) by mouth every 3 (three) hours as needed for moderate pain or severe pain.     rivaroxaban 10 MG Tabs tablet  Commonly known as:  XARELTO  Take 1 tablet (10 mg total) by mouth daily with breakfast. Take Xarelto for two and a half more weeks, then discontinue Xarelto. Once the patient has completed the blood thinner regimen, then take a Baby 81 mg Aspirin daily for three more weeks.     traMADol 50 MG tablet  Commonly known as:  ULTRAM  Take 1-2 tablets (50-100 mg total) by mouth every 6 (six) hours as needed (mild pain).     valsartan-hydrochlorothiazide 160-12.5 MG tablet  Commonly known as:  DIOVAN-HCT  Take 1 tablet by mouth every morning.           Follow-up Information    Follow up with Gearlean Alf, MD. Schedule an appointment as soon as possible for a visit on 01/11/2016.   Specialty:  Orthopedic Surgery   Why:  Call office at [phone] to setup appointment on Tuesday 01/11/2016 with Dr. Wynelle Link.   Contact  information:   [address] [place] [address] [phone]       Signed: Arlee Muslim, PA-C Orthopaedic Surgery 12/28/2015, 9:11 AM

## 2015-12-28 NOTE — Discharge Instructions (Addendum)
° °Dr. Frank [name] °Total Joint Specialist °[facility] °[address] °[place], [place] [address] °[phone] ° °TOTAL KNEE REPLACEMENT POSTOPERATIVE DIRECTIONS ° °Knee Rehabilitation, Guidelines Following Surgery  °Results after knee surgery are often greatly improved when you follow the exercise, range of motion and muscle strengthening exercises prescribed by your doctor. Safety measures are also important to protect the knee from further injury. Any time any of these exercises cause you to have increased pain or swelling in your knee joint, decrease the amount until you are comfortable again and slowly increase them. If you have problems or questions, call your caregiver or physical therapist for advice.  ° °HOME CARE INSTRUCTIONS  °Remove items at home which could result in a fall. This includes throw rugs or furniture in walking pathways.  °· ICE to the affected knee every three hours for 30 minutes at a time and then as needed for pain and swelling.  Continue to use ice on the knee for pain and swelling from surgery. You may notice swelling that will progress down to the foot and ankle.  This is normal after surgery.  Elevate the leg when you are not up walking on it.   °· Continue to use the breathing machine which will help keep your temperature down.  It is common for your temperature to cycle up and down following surgery, especially at night when you are not up moving around and exerting yourself.  The breathing machine keeps your lungs expanded and your temperature down. °· Do not place pillow under knee, focus on keeping the knee straight while resting ° °DIET °You may resume your previous home diet once your are discharged from the hospital. ° °DRESSING / WOUND CARE / SHOWERING °You may shower 3 days after surgery, but keep the wounds dry during showering.  You may use an occlusive plastic wrap (Press'n Seal for example), NO SOAKING/SUBMERGING IN THE BATHTUB.  If the  bandage gets wet, change with a clean dry gauze.  If the incision gets wet, pat the wound dry with a clean towel. °You may start showering once you are discharged home but do not submerge the incision under water. Just pat the incision dry and apply a dry gauze dressing on daily. °Change the surgical dressing daily and reapply a dry dressing each time. ° °ACTIVITY °Walk with your walker as instructed. °Use walker as long as suggested by your caregivers. °Avoid periods of inactivity such as sitting longer than an hour when not asleep. This helps prevent blood clots.  °You may resume a sexual relationship in one month or when given the OK by your doctor.  °You may return to work once you are cleared by your doctor.  °Do not drive a car for 6 weeks or until released by you surgeon.  °Do not drive while taking narcotics. ° °WEIGHT BEARING °Weight bearing as tolerated with assist device (walker, cane, etc) as directed, use it as long as suggested by your surgeon or therapist, typically at least 4-6 weeks. ° °POSTOPERATIVE CONSTIPATION PROTOCOL °Constipation - defined medically as fewer than three stools per week and severe constipation as less than one stool per week. ° °One of the most common issues patients have following surgery is constipation.  Even if you have a regular bowel pattern at home, your normal regimen is likely to be disrupted due to multiple reasons following surgery.  Combination of anesthesia, postoperative narcotics, change in appetite and fluid intake all can affect your bowels.    In order to avoid complications following surgery, here are some recommendations in order to help you during your recovery period. ° °Colace (docusate) - Pick up an over-the-counter form of Colace or another stool softener and take twice a day as long as you are requiring postoperative pain medications.  Take with a full glass of water daily.  If you experience loose stools or diarrhea, hold the colace until you stool forms  back up.  If your symptoms do not get better within 1 week or if they get worse, check with your doctor. ° °Dulcolax (bisacodyl) - Pick up over-the-counter and take as directed by the product packaging as needed to assist with the movement of your bowels.  Take with a full glass of water.  Use this product as needed if not relieved by Colace only.  ° °MiraLax (polyethylene glycol) - Pick up over-the-counter to have on hand.  MiraLax is a solution that will increase the amount of water in your bowels to assist with bowel movements.  Take as directed and can mix with a glass of water, juice, soda, coffee, or tea.  Take if you go more than two days without a movement. °Do not use MiraLax more than once per day. Call your doctor if you are still constipated or irregular after using this medication for 7 days in a row. ° °If you continue to have problems with postoperative constipation, please contact the office for further assistance and recommendations.  If you experience "the worst abdominal pain ever" or develop nausea or vomiting, please contact the office immediatly for further recommendations for treatment. ° °ITCHING ° If you experience itching with your medications, try taking only a single pain pill, or even half a pain pill at a time.  You can also use Benadryl over the counter for itching or also to help with sleep.  ° °TED HOSE STOCKINGS °Wear the elastic stockings on both legs for three weeks following surgery during the day but you may remove then at night for sleeping. ° °MEDICATIONS °See your medication summary on the “After Visit Summary” that the nursing staff will review with you prior to discharge.  You may have some home medications which will be placed on hold until you complete the course of blood thinner medication.  It is important for you to complete the blood thinner medication as prescribed by your surgeon.  Continue your approved medications as instructed at time of  discharge. ° °PRECAUTIONS °If you experience chest pain or shortness of breath - call 911 immediately for transfer to the hospital emergency department.  °If you develop a fever greater that 101 F, purulent drainage from wound, increased redness or drainage from wound, foul odor from the wound/dressing, or calf pain - CONTACT YOUR SURGEON.   °                                                °FOLLOW-UP APPOINTMENTS °Make sure you keep all of your appointments after your operation with your surgeon and caregivers. You should call the office at the above phone number and make an appointment for approximately two weeks after the date of your surgery or on the date instructed by your surgeon outlined in the "After Visit Summary". ° ° °RANGE OF MOTION AND STRENGTHENING EXERCISES  °Rehabilitation of the knee is important following a knee injury or   an operation. After just a few days of immobilization, the muscles of the thigh which control the knee become weakened and shrink (atrophy). Knee exercises are designed to build up the tone and strength of the thigh muscles and to improve knee motion. Often times heat used for twenty to thirty minutes before working out will loosen up your tissues and help with improving the range of motion but do not use heat for the first two weeks following surgery. These exercises can be done on a training (exercise) mat, on the floor, on a table or on a bed. Use what ever works the best and is most comfortable for you Knee exercises include:  Leg Lifts - While your knee is still immobilized in a splint or cast, you can do straight leg raises. Lift the leg to 60 degrees, hold for 3 sec, and slowly lower the leg. Repeat 10-20 times 2-3 times daily. Perform this exercise against resistance later as your knee gets better.  Quad and Hamstring Sets - Tighten up the muscle on the front of the thigh (Quad) and hold for 5-10 sec. Repeat this 10-20 times hourly. Hamstring sets are done by pushing the  foot backward against an object and holding for 5-10 sec. Repeat as with quad sets.   Leg Slides: Lying on your back, slowly slide your foot toward your buttocks, bending your knee up off the floor (only go as far as is comfortable). Then slowly slide your foot back down until your leg is flat on the floor again.  Angel Wings: Lying on your back spread your legs to the side as far apart as you can without causing discomfort.  A rehabilitation program following serious knee injuries can speed recovery and prevent re-injury in the future due to weakened muscles. Contact your doctor or a physical therapist for more information on knee rehabilitation.   IF YOU ARE TRANSFERRED TO A SKILLED REHAB FACILITY If the patient is transferred to a skilled rehab facility following release from the hospital, a list of the current medications will be sent to the facility for the patient to continue.  When discharged from the skilled rehab facility, please have the facility set up the patient's [place] prior to being released. Also, the skilled facility will be responsible for providing the patient with their medications at time of release from the facility to include their pain medication, the muscle relaxants, and their blood thinner medication. If the patient is still at the rehab facility at time of the two week follow up appointment, the skilled rehab facility will also need to assist the patient in arranging follow up appointment in our office and any transportation needs.  MAKE SURE YOU:  Understand these instructions.  Get help right away if you are not doing well or get worse.    Pick up stool softner and laxative for home use following surgery while on pain medications. Do not submerge incision under water. Please use good hand washing techniques while changing dressing each day. May shower starting three days after surgery. Please use a clean towel to pat the incision dry following  showers. Continue to use ice for pain and swelling after surgery. Do not use any lotions or creams on the incision until instructed by your surgeon.  Take Xarelto for two and a half more weeks, then discontinue Xarelto. Once the patient has completed the blood thinner regimen, then take a Baby 81 mg Aspirin daily for three more weeks.  Information on  my medicine - XARELTO (Rivaroxaban)  This medication education was reviewed with me or my healthcare representative as part of my discharge preparation.  The pharmacist that spoke with me during my hospital stay was:  Cheral Almas, Stu-PharmD  Why was Xarelto prescribed for you? Xarelto was prescribed for you to reduce the risk of blood clots forming after orthopedic surgery. The medical term for these abnormal blood clots is venous thromboembolism (VTE).  What do you need to know about xarelto ? Take your Xarelto ONCE DAILY at the same time every day. You may take it either with or without food.  If you have difficulty swallowing the tablet whole, you may crush it and mix in applesauce just prior to taking your dose.  Take Xarelto exactly as prescribed by your doctor and DO NOT stop taking Xarelto without talking to the doctor who prescribed the medication.  Stopping without other VTE prevention medication to take the place of Xarelto may increase your risk of developing a clot.  After discharge, you should have regular check-up appointments with your healthcare provider that is prescribing your Xarelto.    What do you do if you miss a dose? If you miss a dose, take it as soon as you remember on the same day then continue your regularly scheduled once daily regimen the next day. Do not take two doses of Xarelto on the same day.   Important Safety Information A possible side effect of Xarelto is bleeding. You should call your healthcare provider right away if you experience any of the following: ? Bleeding from an injury or  your nose that does not stop. ? Unusual colored urine (red or dark brown) or unusual colored stools (red or black). ? Unusual bruising for unknown reasons. ? A serious fall or if you hit your head (even if there is no bleeding).  Some medicines may interact with Xarelto and might increase your risk of bleeding while on Xarelto. To help avoid this, consult your healthcare provider or pharmacist prior to using any new prescription or non-prescription medications, including herbals, vitamins, non-steroidal anti-inflammatory drugs (NSAIDs) and supplements.  This website has more information on Xarelto: [email or web address]

## 2015-12-28 NOTE — Progress Notes (Signed)
   Subjective: 1 Day Post-Op Procedure(s) (LRB): RIGHT TOTAL KNEE ARTHROPLASTY (Right) Patient reports pain as mild.   Patient seen in rounds with Dr. Wynelle Link. Wife in room Hiccups last night. Will change meds.  Walked 40 feet yesterday. Patient is well, and has had no acute complaints or problems We will resume therapy today.  Plan is to go Home after hospital stay.  Objective: Vital signs in last 24 hours: Temp:  [97.2 F (36.2 C)-98.4 F (36.9 C)] 97.2 F (36.2 C) (05/09 0610) Pulse Rate:  [69-83] 81 (05/09 0610) Resp:  [10-17] 15 (05/09 0610) BP: (100-146)/(58-90) 118/72 mmHg (05/09 0610) SpO2:  [97 %-100 %] 98 % (05/09 0610)  Intake/Output from previous day:  Intake/Output Summary (Last 24 hours) at 12/28/15 0804 Last data filed at 12/28/15 0611  Gross per 24 hour  Intake   4550 ml  Output   1625 ml  Net   2925 ml    Intake/Output this shift: UOP 500  Labs:  Recent Labs  12/28/15 0421  HGB 11.2*    Recent Labs  12/28/15 0421  WBC 10.2  RBC 3.41*  HCT 31.8*  PLT 260    Recent Labs  12/28/15 0421  NA 134*  K 4.2  CL 106  CO2 19*  BUN 19  CREATININE 0.88  GLUCOSE 121*  CALCIUM 8.2*   No results for input(s): LABPT, INR in the last 72 hours.  EXAM General - Patient is Alert, Appropriate and Oriented Extremity - Neurovascular intact Sensation intact distally Dorsiflexion/Plantar flexion intact Dressing - dressing C/D/I Motor Function - intact, moving foot and toes well on exam.  Hemovac pulled without difficulty.  Past Medical History  Diagnosis Date  . Hypertension   . Asthma   . Arthritis     right knee oa  . Carpal tunnel syndrome     BILATERAL    Assessment/Plan: 1 Day Post-Op Procedure(s) (LRB): RIGHT TOTAL KNEE ARTHROPLASTY (Right) Principal Problem:   OA (osteoarthritis) of knee  Estimated body mass index is 27.91 kg/(m^2) as calculated from the following:   Height as of this encounter: 5\' 11"  (1.803 m).   Weight as of  this encounter: 90.719 kg (200 lb). Advance diet Up with therapy Plan for discharge tomorrow Discharge home with home health  DVT Prophylaxis - Xarelto Weight-Bearing as tolerated to right leg D/C O2 and Pulse OX and try on Room Air  Arlee Muslim, PA-C Orthopaedic Surgery 12/28/2015, 8:04 AM

## 2015-12-28 NOTE — Progress Notes (Signed)
Physical Therapy Treatment Patient Details Name: Daniel Yu MRN: [id number] DOB: 1932/03/03 Today's Date: 12/28/2015    History of Present Illness [name], h/o L quad tendon repair 05/2015    PT Comments    POD # 1 pm session Assisted with amb a greater distance then back to bed for CPM.   Follow Up Recommendations  Home health PT     Equipment Recommendations  None recommended by PT    Recommendations for Other Services       Precautions / Restrictions Precautions Precautions: Knee;Fall Precaution Comments: Instructed on KI use for amb and stairs.  Required Braces or Orthoses: Knee Immobilizer - Right Restrictions Weight Bearing Restrictions: No Other Position/Activity Restrictions: WBAT    Mobility  Bed Mobility Overal bed mobility: Needs Assistance Bed Mobility: Supine to Sit     Supine to sit: Min guard     General bed mobility comments: increased time  Transfers Overall transfer level: Needs assistance Equipment used: Rolling walker (2 wheeled) Transfers: Sit to/from Stand Sit to Stand: Min guard         General transfer comment: verbal cues for hand placement and increased time  Ambulation/Gait Ambulation/Gait assistance: Min guard Ambulation Distance (Feet): 125 Feet Assistive device: Rolling walker (2 wheeled) Gait Pattern/deviations: Step-to pattern     General Gait Details: tolerated increased distance   Stairs            Wheelchair Mobility    Modified Rankin (Stroke Patients Only)       Balance                                    Cognition Arousal/Alertness: Awake/alert Behavior During Therapy: WFL for tasks assessed/performed Overall Cognitive Status: Within Functional Limits for tasks assessed                      Exercises      General Comments        Pertinent Vitals/Pain Pain Assessment: No/denies pain Pain Score: 5  Pain Location: R knee Pain Descriptors / Indicators:  Sore;Tender Pain Intervention(s): Monitored during session;Premedicated before session;Repositioned;Ice applied    Home Living                      Prior Function            PT Goals (current goals can now be found in the care plan section) Progress towards PT goals: Progressing toward goals    Frequency  7X/week    PT Plan Current plan remains appropriate    Co-evaluation             End of Session Equipment Utilized During Treatment: Gait belt;Right knee immobilizer   Patient left: in chair;with call bell/phone within reach;with family/visitor present     Time: 1405-1420 PT Time Calculation (min) (ACUTE ONLY): 15 min  Charges:  $Gait Training: 8-22 mins                     G Codes:      Rica Koyanagi  PTA [facility]  Acute  Rehab Pager      [phone]

## 2015-12-28 NOTE — Evaluation (Signed)
Occupational Therapy Evaluation and Discharge Patient Details Name: Daniel Yu MRN: [id number] DOB: July 10, 1932 Today's Date: 12/28/2015    History of Present Illness [name], h/o L quad tendon repair 05/2015   Clinical Impression   Pt and wife educated in use of DME and AE for ADL and home safety/fall prevention. Pt requiring min guard assist for OOB mobility with RW. Wife will provide 24 hour assist at home. All questions answered to pt and wife's satisfaction. No further OT needs.    Follow Up Recommendations  No OT follow up    Equipment Recommendations  3 in 1 bedside comode    Recommendations for Other Services       Precautions / Restrictions Precautions Precautions: Knee;Fall Required Braces or Orthoses: Knee Immobilizer - Right Restrictions Weight Bearing Restrictions: No Other Position/Activity Restrictions: WBAT      Mobility Bed Mobility Overal bed mobility: Modified Independent             General bed mobility comments: no physical assist  Transfers   Equipment used: Rolling walker (2 wheeled) Transfers: Sit to/from Stand Sit to Stand: Min guard         General transfer comment: verbal cues for hand placement    Balance                                            ADL Overall ADL's : Needs assistance/impaired Eating/Feeding: Independent;Sitting   Grooming: Wash/dry hands;Standing;Min guard   Upper Body Bathing: Set up;Sitting   Lower Body Bathing: Minimal assistance;Sit to/from stand   Upper Body Dressing : Set up;Sitting   Lower Body Dressing: Minimal assistance;Sit to/from stand   Toilet Transfer: Min guard;Ambulation;RW   Toileting- Clothing Manipulation and Hygiene: Minimal assistance;Sit to/from stand       Functional mobility during ADLs: Min guard;Rolling walker General ADL Comments: Educated pt and wife in use of AE, walker bag and 3 in 1 over toilet. Pt will not be showering until he can manage stairs  and is able to step over edge of tub as he has glass shower doors. Pt educated in safe footwear, home safety and transporting items safely with walker.      Vision     Perception     Praxis      Pertinent Vitals/Pain Pain Assessment: 0-10 Pain Score: 3  Pain Location: R knee Pain Descriptors / Indicators: Sore Pain Intervention(s): Monitored during session;Premedicated before session;Repositioned;Ice applied     Hand Dominance Right   Extremity/Trunk Assessment Upper Extremity Assessment Upper Extremity Assessment: Overall WFL for tasks assessed (impending carpal tunnel surgery)   Lower Extremity Assessment Lower Extremity Assessment: Defer to PT evaluation   Cervical / Trunk Assessment Cervical / Trunk Assessment: Normal   Communication Communication Communication: No difficulties   Cognition Arousal/Alertness: Awake/alert Behavior During Therapy: WFL for tasks assessed/performed Overall Cognitive Status: Within Functional Limits for tasks assessed                     General Comments       Exercises       Shoulder Instructions      Home Living Family/patient expects to be discharged to:: Private residence Living Arrangements: Spouse/significant other Available Help at Discharge: Family;Available 24 hours/day Type of Home: House Home Access: Stairs to enter [employer] of Steps: 4 Entrance Stairs-Rails: None Home Layout: Two level;1/2  bath on main level Alternate Level Stairs-Number of Steps: 12 Alternate Level Stairs-Rails: Right Bathroom Shower/Tub: Tub/shower unit;Door   [employer] Toilet: Standard     Home Equipment: Crutches;Walker - 2 wheels;Toilet riser;Grab bars - tub/shower          Prior Functioning/Environment Level of Independence: Independent             OT Diagnosis: Generalized weakness;Acute pain   OT Problem List:     OT Treatment/Interventions:      OT Goals(Current goals can be found in the care plan  section) Acute Rehab OT Goals Patient Stated Goal: walk long distances  OT Frequency:     Barriers to D/C:            Co-evaluation              End of Session Equipment Utilized During Treatment: Gait belt;Rolling walker  Activity Tolerance: Patient tolerated treatment well Patient left: in bed;with call bell/phone within reach;with bed alarm set;with family/visitor present   Time: [id number] OT Time Calculation (min): 28 min Charges:  OT General Charges $OT Visit: 1 Procedure OT Evaluation $OT Eval Low Complexity: 1 Procedure OT Treatments $Self Care/Home Management : 8-22 mins G-Codes:    Malka So 12/28/2015, 12:23 PM  [phone]

## 2015-12-28 NOTE — Progress Notes (Addendum)
Physical Therapy Treatment Patient Details Name: CINCH RIPPLE MRN: [id number] DOB: [birth year] Today's Date: 12/28/2015    History of Present Illness [name], h/o L quad tendon repair 05/2015    PT Comments    POD # 1 am session Applied KI and instructed on use.  Assisted OOB to amb a greater distance then back to room to perform some TKR TE's followed by ICE.    Follow Up Recommendations  Home health PT     Equipment Recommendations  None recommended by PT    Recommendations for Other Services       Precautions / Restrictions Precautions Precautions: Knee;Fall Precaution Comments: Instructed on KI use for amb and stairs.  Required Braces or Orthoses: Knee Immobilizer - Right Restrictions Weight Bearing Restrictions: No Other Position/Activity Restrictions: WBAT    Mobility  Bed Mobility Overal bed mobility: Needs Assistance Bed Mobility: Supine to Sit     Supine to sit: Min guard     General bed mobility comments: increased time  Transfers Overall transfer level: Needs assistance Equipment used: Rolling walker (2 wheeled) Transfers: Sit to/from Stand Sit to Stand: Min guard         General transfer comment: verbal cues for hand placement and increased time  Ambulation/Gait Ambulation/Gait assistance: Min guard Ambulation Distance (Feet): 84 Feet Assistive device: Rolling walker (2 wheeled) Gait Pattern/deviations: Step-to pattern     General Gait Details: VCs sequencing, no LOB   Stairs            Wheelchair Mobility    Modified Rankin (Stroke Patients Only)       Balance                                    Cognition Arousal/Alertness: Awake/alert Behavior During Therapy: WFL for tasks assessed/performed Overall Cognitive Status: Within Functional Limits for tasks assessed                      Exercises   Total Knee Replacement TE's 10 reps B LE ankle pumps 10 reps towel squeezes 10 reps knee presses 10  reps heel slides  10 reps SAQ's 10 reps SLR's 10 reps ABD Followed by ICE     General Comments        Pertinent Vitals/Pain Pain Assessment: No/denies pain Pain Score: 5  Pain Location: R knee Pain Descriptors / Indicators: Sore;Tender Pain Intervention(s): Monitored during session;Premedicated before session;Repositioned;Ice applied    Home Living                      Prior Function            PT Goals (current goals can now be found in the care plan section) Progress towards PT goals: Progressing toward goals    Frequency  7X/week    PT Plan Current plan remains appropriate    Co-evaluation             End of Session Equipment Utilized During Treatment: Gait belt;Right knee immobilizer   Patient left: in chair;with call bell/phone within reach;with family/visitor present     Time: 0940-1005 PT Time Calculation (min) (ACUTE ONLY): 25 min  Charges:  $Gait Training: 8-22 mins $Therapeutic Exercise: 8-22 mins                    G Codes:      Rica Koyanagi  PTA  [facility]  Acute  Rehab Pager      [phone]

## 2015-12-28 NOTE — Care Management Note (Signed)
Case Management Note  Patient Details  Name: Daniel Yu MRN: [id number] Date of Birth: [birth year]  Subjective/Objective:  S/p Right Total Knee Arthroplasty                  Action/Plan: Discharge planning, spoke with patient and spouse at bedside. Have chosen [facility] for [facility]. Contacted [facility] for referral. Has RW, needs 3-n-1, contacted [facility] to deliver to room. Wife questions whether she needs hospital bed. States her home is 2 levels and she is concerned about patient going up and down stairs. Will have PT work with patient on stairs and f/u to see if she still wants the bed. She says other option would be for him to sleep on couch as that is what he did after previous surgery.   Expected Discharge Date:  12/29/15               Expected Discharge Plan:  [place]  In-House Referral:  NA  Discharge planning Services  CM Consult  Post Acute Care Choice:  Home Health, Durable Medical Equipment Choice offered to:  Patient  DME Arranged:  3-N-1 DME Agency:  [place]:  PT [place]:  [place]  Status of Service:  Completed, signed off  Medicare Important Message Given:    Date Medicare IM Given:    Medicare IM give by:    Date Additional Medicare IM Given:    Additional Medicare Important Message give by:     If discussed at [place] of Stay Meetings, dates discussed:    Additional Comments:  Guadalupe Maple, RN 12/28/2015, 12:09 PM [phone]

## 2015-12-29 LAB — BASIC METABOLIC PANEL
ANION GAP: 7 (ref 5–15)
BUN: 14 mg/dL (ref 6–20)
CALCIUM: 8.2 mg/dL — AB (ref 8.9–10.3)
CHLORIDE: 103 mmol/L (ref 101–111)
CO2: 24 mmol/L (ref 22–32)
Creatinine, Ser: 0.67 mg/dL (ref 0.61–1.24)
GFR calc non Af Amer: >60 mL/min (ref >60)
Glucose, Bld: 107 mg/dL — ABNORMAL HIGH (ref 65–99)
Potassium: 3.9 mmol/L (ref 3.5–5.1)
SODIUM: 134 mmol/L — AB (ref 135–145)

## 2015-12-29 LAB — CBC
HEMATOCRIT: 29.8 % — AB (ref 39.0–52.0)
HEMOGLOBIN: 10.4 g/dL — AB (ref 13.0–17.0)
MCH: 33 pg (ref 26.0–34.0)
MCHC: 34.9 g/dL (ref 30.0–36.0)
MCV: 94.6 fL (ref 78.0–100.0)
Platelets: 243 10*3/uL (ref 150–400)
RBC: 3.15 MIL/uL — ABNORMAL LOW (ref 4.22–5.81)
RDW: 13.2 % (ref 11.5–15.5)
WBC: 8.7 10*3/uL (ref 4.0–10.5)

## 2015-12-29 NOTE — Progress Notes (Signed)
Patient requesting hospital bed for home use, awaiting orders to submit for insurance approval.

## 2015-12-29 NOTE — Progress Notes (Signed)
[facility]    [facility] is providing the following services: commode  If patient discharges after hours, please call [phone].   Daniel Yu 12/29/2015, 10:30 AM

## 2015-12-29 NOTE — Progress Notes (Signed)
Physical Therapy Treatment Patient Details Name: DIONISIO SHOFF MRN: [id number] DOB: 10-30-1931 Today's Date: 12/29/2015    History of Present Illness [name], h/o L quad tendon repair 05/2015    PT Comments    The patient is progressing well. Son to meet patient at home to assist up the steps.  Follow Up Recommendations        Equipment Recommendations       Recommendations for Other Services       Precautions / Restrictions Precautions Precautions: Knee;Fall Precaution Comments: Instructed on KI use for amb and stairs.  Required Braces or Orthoses: Knee Immobilizer - Right    Mobility  Bed Mobility   Bed Mobility: Supine to Sit     Supine to sit: Supervision     General bed mobility comments: increased time  Transfers   Equipment used: Rolling walker (2 wheeled) Transfers: Sit to/from Stand Sit to Stand: Supervision         General transfer comment: verbal cues for hand placement and increased time  Ambulation/Gait Ambulation/Gait assistance: Supervision Ambulation Distance (Feet): 200 Feet Assistive device: Rolling walker (2 wheeled) Gait Pattern/deviations: Step-through pattern Gait velocity: timed walk test score 33.15 10 meter walk test.   General Gait Details: tolerated increased distance   Stairs            Wheelchair Mobility    Modified Rankin (Stroke Patients Only)       Balance                                    Cognition Arousal/Alertness: Awake/alert                          Exercises Total Joint Exercises Ankle Circles/Pumps: AROM;Both;10 reps Quad Sets: AROM;Right;10 reps;Supine Towel Squeeze: AROM;Both;10 reps Short Arc Quad: AROM;Right;10 reps Heel Slides: AAROM;Right;10 reps;Supine    General Comments        Pertinent Vitals/Pain Pain Score: 2  Pain Location: R knee Pain Descriptors / Indicators: Sore Pain Intervention(s): Monitored during session;Premedicated before session     Home Living                      Prior Function            PT Goals (current goals can now be found in the care plan section) Progress towards PT goals: Progressing toward goals    Frequency       PT Plan Current plan remains appropriate    Co-evaluation             End of Session Equipment Utilized During Treatment: Gait belt;Right knee immobilizer Activity Tolerance: Patient tolerated treatment well Patient left: in bed;with call bell/phone within reach;with nursing/sitter in room     Time: 1348-1419 PT Time Calculation (min) (ACUTE ONLY): 31 min  Charges:  $Gait Training: 8-22 mins $Therapeutic Exercise: 8-22 mins                    G Codes:      Claretha Cooper 12/29/2015, 3:01 PM

## 2015-12-29 NOTE — Progress Notes (Signed)
   Subjective: 2 Days Post-Op Procedure(s) (LRB): RIGHT TOTAL KNEE ARTHROPLASTY (Right) Patient reports pain as mild.   Patient seen in rounds with Dr. Wynelle Link. Doing great. Patient is well, and has had no acute complaints or problems Patient is ready to go home. Doing very well.  Objective: Vital signs in last 24 hours: Temp:  [98 F (36.7 C)-98.6 F (37 C)] 98 F (36.7 C) (05/10 0456) Pulse Rate:  [70-83] 70 (05/10 0456) Resp:  [16-18] 16 (05/10 0456) BP: (99-135)/(60-79) 135/79 mmHg (05/10 0456) SpO2:  [95 %-99 %] 98 % (05/10 0456)  Intake/Output from previous day:  Intake/Output Summary (Last 24 hours) at 12/29/15 0716 Last data filed at 12/29/15 0456  Gross per 24 hour  Intake 1267.17 ml  Output   1450 ml  Net -182.83 ml    Labs:  Recent Labs  12/28/15 0421 12/29/15 0355  HGB 11.2* 10.4*    Recent Labs  12/28/15 0421 12/29/15 0355  WBC 10.2 8.7  RBC 3.41* 3.15*  HCT 31.8* 29.8*  PLT 260 243    Recent Labs  12/28/15 0421 12/29/15 0355  NA 134* 134*  K 4.2 3.9  CL 106 103  CO2 19* 24  BUN 19 14  CREATININE 0.88 0.67  GLUCOSE 121* 107*  CALCIUM 8.2* 8.2*   No results for input(s): LABPT, INR in the last 72 hours.  EXAM: General - Patient is Alert, Appropriate and Oriented Extremity - Neurovascular intact Sensation intact distally Dorsiflexion/Plantar flexion intact Incision - clean, dry, no drainage Motor Function - intact, moving foot and toes well on exam.   Assessment/Plan: 2 Days Post-Op Procedure(s) (LRB): RIGHT TOTAL KNEE ARTHROPLASTY (Right) Procedure(s) (LRB): RIGHT TOTAL KNEE ARTHROPLASTY (Right) Past Medical History  Diagnosis Date  . Hypertension   . Asthma   . Arthritis     right knee oa  . Carpal tunnel syndrome     BILATERAL   Principal Problem:   OA (osteoarthritis) of knee  Estimated body mass index is 27.91 kg/(m^2) as calculated from the following:   Height as of this encounter: 5\' 11"  (1.803 m).   Weight  as of this encounter: 90.719 kg (200 lb). Advance diet Up with therapy Discharge home with home health Diet - Cardiac diet Follow up - in 2 weeks Activity - WBAT Disposition - Home Condition Upon Discharge - Good D/C Meds - See DC Summary DVT Prophylaxis - [place], PA-C Orthopaedic Surgery 12/29/2015, 7:16 AM

## 2015-12-29 NOTE — Progress Notes (Signed)
Pt to d/c home with [facility] home [facility]. DME (3n1) delivered to room prior to d/c. AVS reviewed and "My Chart" discussed with pt. Pt capable of verbalizing medications, dressing changes, signs and symptoms of infection, and follow-up appointments. Remains hemodynamically stable. No signs and symptoms of distress. Educated pt to return to ER in the case of SOB, dizziness, or chest pain.

## 2015-12-30 NOTE — Progress Notes (Signed)
   12/29/15 1400 Late entry for  12/29/15 AM treatment  PT Visit Information  Assistance Needed +1  History of Present Illness [name], h/o L quad tendon repair 05/2015  Precautions  Precautions Knee;Fall  Precaution Comments Instructed on KI use for amb and stairs.   Required Braces or Orthoses Knee Immobilizer - Right  Cognition  Arousal/Alertness Awake/alert  Bed Mobility  Bed Mobility Supine to Sit  Supine to sit Supervision  Transfers  Equipment used Rolling walker (2 wheeled)  Sit to Stand Supervision  General transfer comment Verbal cues for safety  Ambulation/Gait  Assistive device Rolling walker (2 wheeled)  General Gait Details cues for step length  General stair comments patient had much difficulty with stepping up with  L leg due to past previous patella tendon rupture and not normal strengthh. Decided to use front door with 6 step and a rail. unable to attempt with 2 crutches at all. Son will come to assist getting inside.. patient will stay on first level.  PT - End of Session  Equipment Utilized During Treatment Gait belt;Right knee immobilizer  Activity Tolerance Patient tolerated treatment well  Patient left in chair;with family/visitor present  PT - Assessment/Plan  PT Plan Current plan remains appropriate  PT Frequency (ACUTE ONLY) 7X/week  Follow Up Recommendations Home health PT;Supervision/Assistance - 24 hour  PT equipment None recommended by PT  Tresa Endo PT [phone]

## 2016-01-03 ENCOUNTER — Encounter (HOSPITAL_COMMUNITY): Payer: Self-pay | Admitting: Emergency Medicine

## 2016-01-03 ENCOUNTER — Emergency Department (HOSPITAL_COMMUNITY): Payer: Medicare Other

## 2016-01-03 ENCOUNTER — Inpatient Hospital Stay (HOSPITAL_COMMUNITY)
Admission: EM | Admit: 2016-01-03 | Discharge: 2016-01-10 | DRG: 394 | Disposition: A | Discharge status: Home Health | Payer: Medicare Other | Attending: Internal Medicine | Admitting: Internal Medicine

## 2016-01-03 ENCOUNTER — Inpatient Hospital Stay (HOSPITAL_COMMUNITY): Payer: Medicare Other

## 2016-01-03 DIAGNOSIS — Z9889 Other specified postprocedural states: Secondary | ICD-10-CM | POA: Diagnosis not present

## 2016-01-03 DIAGNOSIS — K913 Postprocedural intestinal obstruction: Secondary | ICD-10-CM | POA: Diagnosis not present

## 2016-01-03 DIAGNOSIS — R7309 Other abnormal glucose: Secondary | ICD-10-CM | POA: Diagnosis not present

## 2016-01-03 DIAGNOSIS — K567 Ileus, unspecified: Secondary | ICD-10-CM

## 2016-01-03 DIAGNOSIS — Z79899 Other long term (current) drug therapy: Secondary | ICD-10-CM

## 2016-01-03 DIAGNOSIS — Z7901 Long term (current) use of anticoagulants: Secondary | ICD-10-CM | POA: Diagnosis not present

## 2016-01-03 DIAGNOSIS — E860 Dehydration: Secondary | ICD-10-CM | POA: Diagnosis present

## 2016-01-03 DIAGNOSIS — Z96651 Presence of right artificial knee joint: Secondary | ICD-10-CM | POA: Diagnosis present

## 2016-01-03 DIAGNOSIS — D72829 Elevated white blood cell count, unspecified: Secondary | ICD-10-CM | POA: Diagnosis present

## 2016-01-03 DIAGNOSIS — J45909 Unspecified asthma, uncomplicated: Secondary | ICD-10-CM | POA: Diagnosis present

## 2016-01-03 DIAGNOSIS — R066 Hiccough: Secondary | ICD-10-CM | POA: Diagnosis present

## 2016-01-03 DIAGNOSIS — Z7982 Long term (current) use of aspirin: Secondary | ICD-10-CM

## 2016-01-03 DIAGNOSIS — K56609 Unspecified intestinal obstruction, unspecified as to partial versus complete obstruction: Secondary | ICD-10-CM

## 2016-01-03 DIAGNOSIS — Y838 Other surgical procedures as the cause of abnormal reaction of the patient, or of later complication, without mention of misadventure at the time of the procedure: Secondary | ICD-10-CM | POA: Diagnosis present

## 2016-01-03 DIAGNOSIS — I10 Essential (primary) hypertension: Secondary | ICD-10-CM | POA: Diagnosis present

## 2016-01-03 DIAGNOSIS — E876 Hypokalemia: Secondary | ICD-10-CM | POA: Diagnosis present

## 2016-01-03 DIAGNOSIS — K9189 Other postprocedural complications and disorders of digestive system: Secondary | ICD-10-CM

## 2016-01-03 DIAGNOSIS — K5669 Other intestinal obstruction: Secondary | ICD-10-CM | POA: Diagnosis not present

## 2016-01-03 DIAGNOSIS — E871 Hypo-osmolality and hyponatremia: Secondary | ICD-10-CM

## 2016-01-03 DIAGNOSIS — R739 Hyperglycemia, unspecified: Secondary | ICD-10-CM | POA: Diagnosis present

## 2016-01-03 DIAGNOSIS — K5660 Unspecified intestinal obstruction: Secondary | ICD-10-CM | POA: Diagnosis present

## 2016-01-03 DIAGNOSIS — Z0189 Encounter for other specified special examinations: Secondary | ICD-10-CM

## 2016-01-03 HISTORY — DX: Unspecified intestinal obstruction, unspecified as to partial versus complete obstruction: K56.609

## 2016-01-03 LAB — COMPREHENSIVE METABOLIC PANEL
ALBUMIN: 3.1 g/dL — AB (ref 3.5–5.0)
ALK PHOS: 47 U/L (ref 38–126)
ALT: 17 U/L (ref 17–63)
ANION GAP: 12 (ref 5–15)
AST: 20 U/L (ref 15–41)
BUN: 21 mg/dL — ABNORMAL HIGH (ref 6–20)
CHLORIDE: 89 mmol/L — AB (ref 101–111)
CO2: 23 mmol/L (ref 22–32)
Calcium: 8.4 mg/dL — ABNORMAL LOW (ref 8.9–10.3)
Creatinine, Ser: 0.78 mg/dL (ref 0.61–1.24)
GFR calc Af Amer: >60 mL/min (ref >60)
GFR calc non Af Amer: >60 mL/min (ref >60)
GLUCOSE: 135 mg/dL — AB (ref 65–99)
POTASSIUM: 3.3 mmol/L — AB (ref 3.5–5.1)
SODIUM: 124 mmol/L — AB (ref 135–145)
Total Bilirubin: 1.2 mg/dL (ref 0.3–1.2)
Total Protein: 6.1 g/dL — ABNORMAL LOW (ref 6.5–8.1)

## 2016-01-03 LAB — BASIC METABOLIC PANEL
ANION GAP: 9 (ref 5–15)
BUN: 20 mg/dL (ref 6–20)
CHLORIDE: 92 mmol/L — AB (ref 101–111)
CO2: 25 mmol/L (ref 22–32)
CREATININE: 0.79 mg/dL (ref 0.61–1.24)
Calcium: 8 mg/dL — ABNORMAL LOW (ref 8.9–10.3)
GFR calc non Af Amer: >60 mL/min (ref >60)
Glucose, Bld: 119 mg/dL — ABNORMAL HIGH (ref 65–99)
POTASSIUM: 3.6 mmol/L (ref 3.5–5.1)
SODIUM: 126 mmol/L — AB (ref 135–145)

## 2016-01-03 LAB — I-STAT CG4 LACTIC ACID, ED: Lactic Acid, Venous: 1.62 mmol/L (ref 0.5–2.0)

## 2016-01-03 LAB — CBC WITH DIFFERENTIAL/PLATELET
BASOS PCT: 0 %
Basophils Absolute: 0 10*3/uL (ref 0.0–0.1)
EOS ABS: 0 10*3/uL (ref 0.0–0.7)
Eosinophils Relative: 1 %
HCT: 32.1 % — ABNORMAL LOW (ref 39.0–52.0)
HEMOGLOBIN: 11.5 g/dL — AB (ref 13.0–17.0)
Lymphocytes Relative: 9 %
Lymphs Abs: 0.4 10*3/uL — ABNORMAL LOW (ref 0.7–4.0)
MCH: 33.1 pg (ref 26.0–34.0)
MCHC: 35.8 g/dL (ref 30.0–36.0)
MCV: 92.5 fL (ref 78.0–100.0)
MONOS PCT: 21 %
Monocytes Absolute: 0.9 10*3/uL (ref 0.1–1.0)
NEUTROS PCT: 69 %
Neutro Abs: 2.9 10*3/uL (ref 1.7–7.7)
Platelets: 443 10*3/uL — ABNORMAL HIGH (ref 150–400)
RBC: 3.47 MIL/uL — ABNORMAL LOW (ref 4.22–5.81)
RDW: 12.7 % (ref 11.5–15.5)
WBC: 4.2 10*3/uL (ref 4.0–10.5)

## 2016-01-03 LAB — LIPASE, BLOOD: Lipase: 15 U/L (ref 11–51)

## 2016-01-03 MED ORDER — FENTANYL CITRATE (PF) 100 MCG/2ML IJ SOLN: 50.0000 ug | INTRAMUSCULAR | Status: DC | PRN

## 2016-01-03 MED ORDER — ONDANSETRON HCL 4 MG/2ML IJ SOLN: 4.0000 mg | Freq: Four times a day (QID) | INTRAMUSCULAR | Status: DC | PRN

## 2016-01-03 MED ORDER — HEPARIN SODIUM (PORCINE) 5000 UNIT/ML IJ SOLN
5000.0000 [IU] | Freq: Three times a day (TID) | INTRAMUSCULAR | Status: DC
  Administered 2016-01-03 – 2016-01-10 (×22): 5000 [IU] via SUBCUTANEOUS
  Filled 2016-01-03 (×22): qty 1

## 2016-01-03 MED ORDER — ACETAMINOPHEN 650 MG RE SUPP: 650.0000 mg | Freq: Four times a day (QID) | RECTAL | Status: DC | PRN

## 2016-01-03 MED ORDER — SODIUM CHLORIDE 0.9 % IV SOLN: Freq: Once | INTRAVENOUS | Status: DC

## 2016-01-03 MED ORDER — LIDOCAINE HCL 2 % EX GEL: 1.0000 "application " | Freq: Once | CUTANEOUS | Status: DC

## 2016-01-03 MED ORDER — ONDANSETRON HCL 4 MG PO TABS: 4.0000 mg | ORAL_TABLET | Freq: Four times a day (QID) | ORAL | Status: DC | PRN

## 2016-01-03 MED ORDER — POTASSIUM CHLORIDE IN NACL 20-0.9 MEQ/L-% IV SOLN
INTRAVENOUS | Status: DC
  Administered 2016-01-03 – 2016-01-10 (×10): via INTRAVENOUS
  Filled 2016-01-03 (×16): qty 1000

## 2016-01-03 MED ORDER — ACETAMINOPHEN 325 MG PO TABS
650.0000 mg | ORAL_TABLET | Freq: Four times a day (QID) | ORAL | Status: DC | PRN
  Administered 2016-01-06: 650 mg via ORAL
  Filled 2016-01-03: qty 2

## 2016-01-03 MED ORDER — LORAZEPAM 2 MG/ML IJ SOLN
0.5000 mg | Freq: Once | INTRAMUSCULAR | Status: AC | PRN
  Administered 2016-01-03: 0.5 mg via INTRAVENOUS
  Filled 2016-01-03: qty 1

## 2016-01-03 MED ORDER — IOPAMIDOL (ISOVUE-300) INJECTION 61%
INTRAVENOUS | Status: AC
  Administered 2016-01-03: 100 mL
  Filled 2016-01-03: qty 100

## 2016-01-03 MED ORDER — DIATRIZOATE MEGLUMINE & SODIUM 66-10 % PO SOLN
90.0000 mL | Freq: Once | ORAL | Status: AC
  Administered 2016-01-03: 90 mL via NASOGASTRIC
  Filled 2016-01-03 (×3): qty 90

## 2016-01-03 MED ORDER — SODIUM CHLORIDE 0.9 % IV BOLUS (SEPSIS)
1000.0000 mL | Freq: Once | INTRAVENOUS | Status: AC
  Administered 2016-01-03: 1000 mL via INTRAVENOUS

## 2016-01-03 MED ORDER — OXYMETAZOLINE HCL 0.05 % NA SOLN
1.0000 | Freq: Once | NASAL | Status: DC
Start: 2016-01-03 — End: 2016-01-03

## 2016-01-03 MED ORDER — ONDANSETRON HCL 4 MG/2ML IJ SOLN
4.0000 mg | INTRAMUSCULAR | Status: DC | PRN
  Administered 2016-01-03: 4 mg via INTRAVENOUS
  Filled 2016-01-03: qty 2

## 2016-01-03 MED ORDER — HYDRALAZINE HCL 20 MG/ML IJ SOLN: 10.0000 mg | Freq: Four times a day (QID) | INTRAMUSCULAR | Status: DC | PRN

## 2016-01-03 NOTE — ED Provider Notes (Signed)
CSN: [id number]     Arrival date & time 01/03/16  0522 History   First MD Initiated Contact with Patient 01/03/16 0534     Chief Complaint  Patient presents with  . Emesis     (Consider location/radiation/quality/duration/timing/severity/associated sxs/prior Treatment) Patient is a 82 y.o. male presenting with vomiting. The history is provided by the patient.  Emesis Severity:  Moderate Duration:  3 days Timing:  Constant Number of daily episodes:  Numerous Quality:  Stomach contents Progression:  Worsening Chronicity:  New Recent urination:  Normal Relieved by:  Nothing Worsened by:  Nothing tried Ineffective treatments:  None tried Associated symptoms: abdominal pain (and distention, worening) and diarrhea (multiple loose stools daily)   Associated symptoms: no fever   Abdominal pain:    Location:  Generalized   Quality:  Aching   Severity:  Mild   Onset quality:  Gradual   Duration:  1 week   Timing:  Constant   Progression:  Worsening Risk factors: prior abdominal surgery (left inguinal hernia repair)   Risk factors: no diabetes     Past Medical History  Diagnosis Date  . Hypertension   . Asthma   . Arthritis     right knee oa  . Carpal tunnel syndrome     BILATERAL   Past Surgical History  Procedure Laterality Date  . Colonoscopy with propofol N/A 11/09/2014    Procedure: COLONOSCOPY WITH PROPOFOL;  Surgeon: Garlan Fair, MD;  Location: [facility] ENDOSCOPY;  Service: Endoscopy;  Laterality: N/A;  . Quadriceps tendon repair Left 06/07/2015    Procedure: REPAIR LEFT QUADRICEP TENDON;  Surgeon: Gaynelle Arabian, MD;  Location: [facility] ORS;  Service: Orthopedics;  Laterality: Left;  . Hernia repair      2013  . Total knee arthroplasty Right 12/27/2015    Procedure: RIGHT TOTAL KNEE ARTHROPLASTY;  Surgeon: Gaynelle Arabian, MD;  Location: [facility] ORS;  Service: Orthopedics;  Laterality: Right;   No family history on file. Social History  Substance Use Topics  . Smoking status:  Never Smoker   . Smokeless tobacco: Never Used  . Alcohol Use: Yes     Comment:  2 beers daily    Review of Systems  Gastrointestinal: Positive for vomiting, abdominal pain (and distention, worening) and diarrhea (multiple loose stools daily).  All other systems reviewed and are negative.     Allergies  Review of patient's allergies indicates no known allergies.  Home Medications   Prior to Admission medications   Medication Sig Start Date End Date Taking? Authorizing Provider  cyclobenzaprine (FLEXERIL) 10 MG tablet Take 1 tablet (10 mg total) by mouth 3 (three) times daily as needed for muscle spasms. 12/28/15   Arlee Muslim, PA-C  fluticasone (FLOVENT HFA) 110 MCG/ACT inhaler Inhale 2 puffs into the lungs 2 (two) times daily.    Historical Provider, MD  HYDROmorphone (DILAUDID) 2 MG tablet Take 1-2 tablets (2-4 mg total) by mouth every 3 (three) hours as needed for moderate pain or severe pain. 12/28/15   Arlee Muslim, PA-C  rivaroxaban (XARELTO) 10 MG TABS tablet Take 1 tablet (10 mg total) by mouth daily with breakfast. Take Xarelto for two and a half more weeks, then discontinue Xarelto. Once the patient has completed the blood thinner regimen, then take a Baby 81 mg Aspirin daily for three more weeks. 12/28/15   Arlee Muslim, PA-C  traMADol (ULTRAM) 50 MG tablet Take 1-2 tablets (50-100 mg total) by mouth every 6 (six) hours as needed (mild pain). 12/28/15  Arlee Muslim, PA-C  valsartan-hydrochlorothiazide (DIOVAN-HCT) 160-12.5 MG per tablet Take 1 tablet by mouth every morning.    Historical Provider, MD   There were no vitals taken for this visit. Physical Exam  Constitutional: He is oriented to person, place, and time. He appears well-developed and well-nourished. No distress.  HENT:  Head: Normocephalic and atraumatic.  Eyes: Conjunctivae are normal.  Neck: Neck supple. No tracheal deviation present.  Cardiovascular: Normal rate, regular rhythm and normal heart sounds.    Pulmonary/Chest: Effort normal and breath sounds normal. No respiratory distress.  Abdominal: Soft. He exhibits distension. There is tenderness. There is guarding.  Tympany to percussion  Neurological: He is alert and oriented to person, place, and time.  Skin: Skin is warm and dry.  Psychiatric: He has a normal mood and affect.  Vitals reviewed.   ED Course  Procedures (including critical care time) Labs Review Labs Reviewed  CBC WITH DIFFERENTIAL/PLATELET - Abnormal; Notable for the following:    RBC 3.47 (*)    Hemoglobin 11.5 (*)    HCT 32.1 (*)    Platelets 443 (*)    Lymphs Abs 0.4 (*)    All other components within normal limits  COMPREHENSIVE METABOLIC PANEL - Abnormal; Notable for the following:    Sodium 124 (*)    Potassium 3.3 (*)    Chloride 89 (*)    Glucose, Bld 135 (*)    BUN 21 (*)    Calcium 8.4 (*)    Total Protein 6.1 (*)    Albumin 3.1 (*)    All other components within normal limits  LIPASE, BLOOD  URINALYSIS, ROUTINE W REFLEX MICROSCOPIC (NOT AT [facility])  I-STAT CG4 LACTIC ACID, ED    Imaging Review Ct Abdomen Pelvis W Contrast  01/03/2016  CLINICAL DATA:  Recent knee surgery, abdominal pain/vomiting, evaluate for obstruction EXAM: CT ABDOMEN AND PELVIS WITH CONTRAST TECHNIQUE: Multidetector CT imaging of the abdomen and pelvis was performed using the standard protocol following bolus administration of intravenous contrast. CONTRAST:  14mL ISOVUE-300 IOPAMIDOL (ISOVUE-300) INJECTION 61% COMPARISON:  None. FINDINGS: Lower chest: Mild patchy bilateral lower lobe opacities, likely atelectasis. Hepatobiliary: Liver is within normal limits. No suspicious/enhancing hepatic lesions. Gallbladder is unremarkable. No intrahepatic or extrahepatic ductal dilatation. Pancreas: Within normal limits. Spleen: Within normal limits. Adrenals/Urinary Tract: Adrenal glands are within normal limits. Kidneys are within normal limits.  No hydronephrosis. Bladder is within normal  limits. Stomach/Bowel: Stomach is notable for a tiny hiatal hernia. Multiple dilated loops of small bowel. Right colon is also dilated, including the ascending transverse, and descending colon. Sigmoid colon and rectum are mildly decompressed. If a transition point is present, it is in the left lower quadrant/ inguinal region (series 2/image 87), at the site of a prior inguinal hernia repair, although no definite hernia is present. Vascular/Lymphatic: Atherosclerotic calcifications of the abdominal aorta and branch vessels. No evidence of abdominal aortic aneurysm. No suspicious abdominopelvic lymphadenopathy. Reproductive: Prostate is grossly unremarkable. Other: Small volume perihepatic ascites.  Trace left pelvic ascites. Postsurgical changes related to prior bilateral inguinal hernia repairs. Musculoskeletal: Moderate degenerative changes of the visualized thoracolumbar spine. IMPRESSION: Dilated loops of small and large bowel. Relative decompression of the sigmoid colon and rectum. Overall, in the setting of recent surgery, adynamic postoperative ileus is suspected. Colonic obstruction with relative transition in the left inguinal region is considered less likely given the lack of a true left inguinal hernia on CT. Status post bilateral inguinal hernia repair. Electronically Signed   By:  Julian Hy M.D.   On: 01/03/2016 08:51   Dg Abd Acute W/chest  01/03/2016  CLINICAL DATA:  Vomiting. Right knee replacement 1 week ago. Nausea for 3 days, vomiting for 1 day, abdominal distention for 2 days. EXAM: DG ABDOMEN ACUTE W/ 1V CHEST COMPARISON:  None. FINDINGS: Low lung volumes with linear atelectasis or fibrosis in the lung bases. Probable emphysematous changes in the lungs. Normal heart size and pulmonary vascularity. No focal consolidation or airspace disease. Calcified and tortuous aorta. Diffuse gaseous distention of small bowel with multiple air-fluid levels. There is some prominence of gas-filled  colon as well. Although these changes could represent ileus, the degree of small bowel dilatation is more than would be expected for ileus and obstruction is suggested. No free intra-abdominal air. No radiopaque stones. Degenerative changes in the spine and hips. Vascular calcifications. IMPRESSION: No evidence of active pulmonary disease. Prominent gas distended small bowel with air-fluid levels likely represent small bowel obstruction. There is some gas distention of colon is well. Level of obstruction is not indicated. Electronically Signed   By: Lucienne Capers M.D.   On: 01/03/2016 06:46   I have personally reviewed and evaluated these images and lab results as part of my medical decision-making.   EKG Interpretation None      MDM   Final diagnoses:  Postoperative ileus  Hyponatremia    82 y.o. male presents with Increasing abdominal distention since having surgery roughly 1 week ago and progression to vomiting and nausea over the last 3 days. He had been on pain medicines medication following his right knee replacement and was also on MiraLAX and Colace to help with constipation difficulty. He has noticed watery stools but no solid bowel movements since surgery, he has been passing flatus normally. He has a markedly distended abdomen that is tympanic concerning for postoperative ileus or obstruction given remote history of inguinal hernia repair on the left. Screening acute series x-rays with lab work were started with plan to progress to CT scanning if findings are not definitive.  Plain films suspicious for ileus or obstruction, CT ordered for definitive imaging. Pt with hyponatreamia and hypochloremia suggesting likely ileus. NGT placed for decompression. Hospitalist was consulted for admission and will see the patient in the emergency department.     Leo Grosser, MD 01/03/16 [phone]

## 2016-01-03 NOTE — Plan of Care (Signed)
Problem: Bowel/Gastric: Goal: Will not experience complications related to bowel motility Outcome: Not Progressing Patient has bowel sounds but still has the ileus. Patient encouraged to ambulate

## 2016-01-03 NOTE — ED Notes (Signed)
Patient transported to X-ray 

## 2016-01-03 NOTE — ED Notes (Signed)
Admitting MD at bedside.

## 2016-01-03 NOTE — Discharge Planning (Signed)
Pt currently active with [facility] for PT services.  Resumption of care requested. Pt currently has DME rolling walker and 3N1 from last admission; no DME needs identified at this time.

## 2016-01-03 NOTE — ED Notes (Signed)
Pt from home with c/o n/v and hiccups since Friday. Pt had total knee replacement on Monday 12/27/15.

## 2016-01-03 NOTE — ED Notes (Signed)
Pt. Transported to CT at this time.  

## 2016-01-03 NOTE — Progress Notes (Signed)
Patients NG tube is in the fundus of the stomach. MD paged to make sure it is okay to administer contrast dye for his xray. Awaiting response.

## 2016-01-03 NOTE — ED Notes (Signed)
Admitting MD at the bedside.  

## 2016-01-03 NOTE — H&P (Signed)
History and Physical    Daniel Yu [id number] DOB: September 07, 1931 DOA: 01/03/2016   PCP: Irven Shelling, MD   Patient coming from/Resides with: Private residence, lives with wife  Chief Complaint: Intractable nausea and vomiting with abdominal distention  HPI: Daniel Yu is a 82 y.o. male with medical history significant for osteoarthritis, prior left inguinal hernia repair and recent right TKA on 5/8. During the hospitalization patient did well and was discharged home w/ PT, right knee brace and Xarelto for DVT prophylaxis. Within 24 hours after discharging home patient began developing progressive abdominal bloating to the point it was severe enough on Friday his wife began to notice. He took Colace and MiraLAX and had multiple stools without any resolution of the symptoms. Unfortunately by Friday evening he developed dry heaves and was unable to sleep. He continued to have recurrent episodes of nausea and vomiting without blood. He is passing a small amount of flatus but hasn't had a bowel movement in greater than 24 hours. He is not having any abdominal pain. He does not report any issues with chronic constipation. He reports he took hydrocodone while in the hospital but quickly transitioned to tramadol at home due to decreased pain in the right leg. An prior to development of recurrent nausea and vomiting pain was well controlled with extra strength Tylenol. Because of continued at abdominal distention with nausea and vomiting he presented to the ER for evaluation and treatment.  ED Course:  BP 147/74-pulse 93 dust respirations 18-room air saturations 95% AAS: Chest x-ray unremarkable; prominent gas distended small bowel with air-fluid levels likely representative of small bowel obstruction with gaseous distention in the colon especially right colon. Level of obstruction not well identified on plain films CT abdomen and pelvis with contrast: Right colonic dilatation with multiple  dilated loops of small bowel with a transition zone in the left lower quadrant inguinal region at the site of the prior inguinal hernia repair without definitive hernia noted Zofran 4 mg IV 1 NG tube to low wall suction ordered procedure not yet completed by staff as patient was subsequently sent to CT scan   Review of Systems:  In addition to the HPI above,  No Fever-chills, myalgias or other constitutional symptoms No Headache, changes with Vision or hearing, new weakness, tingling, numbness in any extremity, No problems swallowing food or Liquids, indigestion/reflux No Chest pain, Cough or Shortness of Breath, palpitations, orthopnea or DOE No Abdominal pain,  melena or hematochezia, no dark tarry stools No dysuria, hematuria or flank pain No new skin rashes, lesions, masses or bruises, No new joints pains-aches No recent weight gain or loss No polyuria, polydypsia or polyphagia,   Past Medical History  Diagnosis Date  . Hypertension   . Asthma   . Arthritis     right knee oa  . Carpal tunnel syndrome     BILATERAL    Past Surgical History  Procedure Laterality Date  . Colonoscopy with propofol N/A 11/09/2014    Procedure: COLONOSCOPY WITH PROPOFOL;  Surgeon: Garlan Fair, MD;  Location: [facility];  Service: Endoscopy;  Laterality: N/A;  . Quadriceps tendon repair Left 06/07/2015    Procedure: REPAIR LEFT QUADRICEP TENDON;  Surgeon: Gaynelle Arabian, MD;  Location: [facility];  Service: Orthopedics;  Laterality: Left;  . Hernia repair      2013  . Total knee arthroplasty Right 12/27/2015    Procedure: RIGHT TOTAL KNEE ARTHROPLASTY;  Surgeon: Gaynelle Arabian, MD;  Location: [facility];  Service:  Orthopedics;  Laterality: Right;     reports that he has never smoked. He has never used smokeless tobacco. He reports that he drinks alcohol. He reports that he does not use illicit drugs.  Mobility: Rolling walker-working with PT postoperatively in the home Work history: Not  obtained   No Known Allergies   Family history reviewed and not pertinent To current presenting symptoms and diagnosis  Prior to Admission medications   Medication Sig Start Date End Date Taking? Authorizing Provider  acetaminophen (TYLENOL) 500 MG tablet Take 1,000 mg by mouth every 6 (six) hours as needed for mild pain.   Yes Historical Provider, MD  fluticasone (FLOVENT HFA) 110 MCG/ACT inhaler Inhale 2 puffs into the lungs 2 (two) times daily.   Yes Historical Provider, MD  rivaroxaban (XARELTO) 10 MG TABS tablet Take 1 tablet (10 mg total) by mouth daily with breakfast. Take Xarelto for two and a half more weeks, then discontinue Xarelto. Once the patient has completed the blood thinner regimen, then take a Baby 81 mg Aspirin daily for three more weeks. 12/28/15  Yes Arlee Muslim, PA-C  valsartan-hydrochlorothiazide (DIOVAN-HCT) 160-12.5 MG per tablet Take 1 tablet by mouth every morning.   Yes Historical Provider, MD  cyclobenzaprine (FLEXERIL) 10 MG tablet Take 1 tablet (10 mg total) by mouth 3 (three) times daily as needed for muscle spasms. Patient not taking: Reported on 01/03/2016 12/28/15   Arlee Muslim, PA-C    Physical Exam: Filed Vitals:   01/03/16 0600 01/03/16 0615 01/03/16 0645 01/03/16 0700  BP: 147/74 136/75 125/69 131/73  Pulse: 93 93 95 86  Resp: 18 20 23 22   SpO2: 95% 96% 95% 94%      Constitutional: NAD, calm, comfortable Eyes: PERRL, lids and conjunctivae normal ENMT: Mucous membranes are moist. Posterior pharynx clear of any exudate or lesions.Normal dentition.  Neck: normal, supple, no masses, no thyromegaly Respiratory: clear to auscultation bilaterally, no wheezing, no crackles. Normal respiratory effort. No accessory muscle use.  Cardiovascular: Regular rate and rhythm, no murmurs / rubs / gallops. No extremity edema. 2+ pedal pulses. No carotid bruits.  Abdomen: Markedly distended, nontender but tympanitic, bowel sounds are absent, unable to appreciate it  has hepatosplenomegaly, no visible hernias Musculoskeletal: no clubbing / cyanosis. No joint deformity upper and lower extremities. Good ROM, no contractures. Normal muscle tone. Right leg immobilizer in place Skin: no rashes, lesions, ulcers. No induration Neurologic: CN 2-12 grossly intact. Sensation intact, DTR normal. Strength 5/5 x all 4 extremities.  Psychiatric: Normal judgment and insight. Alert and oriented x 3. Normal mood.    Labs on Admission: I have personally reviewed following labs and imaging studies  CBC:  Recent Labs Lab 12/28/15 0421 12/29/15 0355 01/03/16 0617  WBC 10.2 8.7 4.2  NEUTROABS  --   --  2.9  HGB 11.2* 10.4* 11.5*  HCT 31.8* 29.8* 32.1*  MCV 93.3 94.6 92.5  PLT 260 243 [id number]*   Basic Metabolic Panel:  Recent Labs Lab 12/28/15 0421 12/29/15 0355 01/03/16 0617  NA 134* 134* 124*  K 4.2 3.9 3.3*  CL 106 103 89*  CO2 19* 24 23  GLUCOSE 121* 107* 135*  BUN 19 14 21*  CREATININE 0.88 0.67 0.78  CALCIUM 8.2* 8.2* 8.4*   GFR: Estimated Creatinine Clearance: 83.5 mL/min (by C-G formula based on Cr of 0.78). Liver Function Tests:  Recent Labs Lab 01/03/16 0617  AST 20  ALT 17  ALKPHOS 47  BILITOT 1.2  PROT 6.1*  ALBUMIN 3.1*  Recent Labs Lab 01/03/16 0617  LIPASE 15   No results for input(s): AMMONIA in the last 168 hours. Coagulation Profile: No results for input(s): INR, PROTIME in the last 168 hours. Cardiac Enzymes: No results for input(s): CKTOTAL, CKMB, CKMBINDEX, TROPONINI in the last 168 hours. BNP (last 3 results) No results for input(s): PROBNP in the last 8760 hours. HbA1C: No results for input(s): HGBA1C in the last 72 hours. CBG: No results for input(s): GLUCAP in the last 168 hours. Lipid Profile: No results for input(s): CHOL, HDL, LDLCALC, TRIG, CHOLHDL, LDLDIRECT in the last 72 hours. Thyroid Function Tests: No results for input(s): TSH, T4TOTAL, FREET4, T3FREE, THYROIDAB in the last 72 hours. Anemia  Panel: No results for input(s): VITAMINB12, FOLATE, FERRITIN, TIBC, IRON, RETICCTPCT in the last 72 hours. Urine analysis:    Component Value Date/Time   COLORURINE YELLOW 12/15/2015 0928   APPEARANCEUR CLEAR 12/15/2015 0928   LABSPEC 1.016 12/15/2015 0928   PHURINE 7.5 12/15/2015 0928   GLUCOSEU NEGATIVE 12/15/2015 0928   HGBUR NEGATIVE 12/15/2015 0928   BILIRUBINUR NEGATIVE 12/15/2015 0928   KETONESUR NEGATIVE 12/15/2015 0928   PROTEINUR NEGATIVE 12/15/2015 0928   NITRITE NEGATIVE 12/15/2015 0928   LEUKOCYTESUR NEGATIVE 12/15/2015 0928   Sepsis Labs: @LABRCNTIP (procalcitonin:4,lacticidven:4) )No results found for this or any previous visit (from the past 240 hour(s)).   Radiological Exams on Admission: Ct Abdomen Pelvis W Contrast  01/03/2016  CLINICAL DATA:  Recent knee surgery, abdominal pain/vomiting, evaluate for obstruction EXAM: CT ABDOMEN AND PELVIS WITH CONTRAST TECHNIQUE: Multidetector CT imaging of the abdomen and pelvis was performed using the standard protocol following bolus administration of intravenous contrast. CONTRAST:  128mL ISOVUE-300 IOPAMIDOL (ISOVUE-300) INJECTION 61% COMPARISON:  None. FINDINGS: Lower chest: Mild patchy bilateral lower lobe opacities, likely atelectasis. Hepatobiliary: Liver is within normal limits. No suspicious/enhancing hepatic lesions. Gallbladder is unremarkable. No intrahepatic or extrahepatic ductal dilatation. Pancreas: Within normal limits. Spleen: Within normal limits. Adrenals/Urinary Tract: Adrenal glands are within normal limits. Kidneys are within normal limits.  No hydronephrosis. Bladder is within normal limits. Stomach/Bowel: Stomach is notable for a tiny hiatal hernia. Multiple dilated loops of small bowel. Right colon is also dilated, including the ascending transverse, and descending colon. Sigmoid colon and rectum are mildly decompressed. If a transition point is present, it is in the left lower quadrant/ inguinal region (series  2/image 87), at the site of a prior inguinal hernia repair, although no definite hernia is present. Vascular/Lymphatic: Atherosclerotic calcifications of the abdominal aorta and branch vessels. No evidence of abdominal aortic aneurysm. No suspicious abdominopelvic lymphadenopathy. Reproductive: Prostate is grossly unremarkable. Other: Small volume perihepatic ascites.  Trace left pelvic ascites. Postsurgical changes related to prior bilateral inguinal hernia repairs. Musculoskeletal: Moderate degenerative changes of the visualized thoracolumbar spine. IMPRESSION: Dilated loops of small and large bowel. Relative decompression of the sigmoid colon and rectum. Overall, in the setting of recent surgery, adynamic postoperative ileus is suspected. Colonic obstruction with relative transition in the left inguinal region is considered less likely given the lack of a true left inguinal hernia on CT. Status post bilateral inguinal hernia repair. Electronically Signed   By: Julian Hy M.D.   On: 01/03/2016 08:51   Dg Abd Acute W/chest  01/03/2016  CLINICAL DATA:  Vomiting. Right knee replacement 1 week ago. Nausea for 3 days, vomiting for 1 day, abdominal distention for 2 days. EXAM: DG ABDOMEN ACUTE W/ 1V CHEST COMPARISON:  None. FINDINGS: Low lung volumes with linear atelectasis or fibrosis in the lung  bases. Probable emphysematous changes in the lungs. Normal heart size and pulmonary vascularity. No focal consolidation or airspace disease. Calcified and tortuous aorta. Diffuse gaseous distention of small bowel with multiple air-fluid levels. There is some prominence of gas-filled colon as well. Although these changes could represent ileus, the degree of small bowel dilatation is more than would be expected for ileus and obstruction is suggested. No free intra-abdominal air. No radiopaque stones. Degenerative changes in the spine and hips. Vascular calcifications. IMPRESSION: No evidence of active pulmonary  disease. Prominent gas distended small bowel with air-fluid levels likely represent small bowel obstruction. There is some gas distention of colon is well. Level of obstruction is not indicated. Electronically Signed   By: Lucienne Capers M.D.   On: 01/03/2016 06:46     Assessment/Plan Principal Problem:   SBO (small bowel obstruction)  -Progressive abdominal distention with intractable nausea and vomiting, absent bowel sounds and CT findings of small bowel obstruction with transition zone at site of prior inguinal hernia repair -Bowel rest with NG tube -Supportive care with IV fluids and anti-emetics -Mobilize and avoid narcotics -Consult general surgery -Abdominal films in a.m.  Active Problems:   Dehydration with hyponatremia -Normal saline IV fluids with potassium -Follow electrolytes    Acute hypokalemia -Secondary to GI losses so will replace with maintenance fluids -May also require additional potassium boluses -Follow electrolytes    S/P right knee surgery/TKA  -Continue right knee immobilizer/brace -Pain was well controlled readmission with Tylenol -Continue PT while in the hospital   HTN -Since nothing by mouth we'll hold home doses of valsartan hydrochlorothiazide -PRN Apresoline with parameters      DVT prophylaxis: Subcutaneous heparin Code Status: Full code Family Communication: Wife, Daniel Yu at bedside Disposition Plan: Anticipate discharge back to preadmission [place] once medically stable Consults called: General surgery/[place] Admission status: Inpatient/surgical floor    Daniel Charles L. ANP-BC [facility] Pager [phone]   If 7PM-7AM, please contact night-coverage [email or web address] Password [facility]  01/03/2016, 9:13 AM

## 2016-01-03 NOTE — Care Management Note (Signed)
Case Management Note  Patient Details  Name: TERRILL MAKOVEC MRN: [id number] Date of Birth: [birth year]  Subjective/Objective:                    Action/Plan: s/p recent TKA on 12/27/15, prior left inguinal hernia repair, patient has walker and 3 in 1 already at home. Will need order for resumption of HHPT with [place]. Tim with [facility] aware patient admitted to room 6n25  Expected Discharge Date:  01/04/16               Expected Discharge Plan:  [place]  In-House Referral:     Discharge planning Services  CM Consult  Post Acute Care Choice:    Choice offered to:     DME Arranged:    DME Agency:     HH Arranged:    [place] Agency:     Status of Service:  In process, will continue to follow  Medicare Important Message Given:    Date Medicare IM Given:    Medicare IM give by:    Date Additional Medicare IM Given:    Additional Medicare Important Message give by:     If discussed at [place] of Stay Meetings, dates discussed:    Additional Comments:  Marilu Favre, RN 01/03/2016, 2:28 PM

## 2016-01-03 NOTE — Consult Note (Signed)
[facility] Surgery Consult Note  Christipher Rieger [facility] 1932-05-23  [id number].    Requesting MD: Dr. Marily Memos Chief Complaint/Reason for Consult: bowel obstruction  HPI:  82 y/o white male with PMH OA s/p recent TKA on 12/27/15, prior left inguinal hernia repair.  During the hospitalization patient did well and was discharged home w/ PT, right knee brace and Xarelto for DVT prophylaxis. Within 24 hours after discharging home patient began developing progressive abdominal bloating to the point it was severe enough on Thursday/Friday his wife began to notice. He took Colace and MiraLAX and had multiple stools without any resolution of the symptoms. Friday he began to have dry heaves. He continued to have recurrent episodes of bilious N/V without blood. Some flatus, but no BM.  Abdominal pain due to distension/pressure.  No h/o chronic constipation.  He was originally on Percocet while in the hospital but was weaned to vicodin and then ultram upon discharge from his knee surgery.   He He is only requiring ES tylenol at this point.  Right knee has been healing well, he is progressing to walking with a cane today.  He has no numbness/tingling.  Because of continued at abdominal distention with N/V he presented to the [facility] for evaluation and treatment.  He was found to have a bowel obstruction (suspected to be colon) per CT around the location of his previous left inguinal hernia site.  ROS: All systems reviewed and otherwise negative except for as above  No family history on file.  Past Medical History  Diagnosis Date  . Hypertension   . Asthma   . Arthritis     right knee oa  . Carpal tunnel syndrome     BILATERAL    Past Surgical History  Procedure Laterality Date  . Colonoscopy with propofol N/A 11/09/2014    Procedure: COLONOSCOPY WITH PROPOFOL;  Surgeon: Garlan Fair, MD;  Location: [facility];  Service: Endoscopy;  Laterality: N/A;  . Quadriceps tendon repair Left 06/07/2015     Procedure: REPAIR LEFT QUADRICEP TENDON;  Surgeon: Gaynelle Arabian, MD;  Location: [facility];  Service: Orthopedics;  Laterality: Left;  . Hernia repair      2013  . Total knee arthroplasty Right 12/27/2015    Procedure: RIGHT TOTAL KNEE ARTHROPLASTY;  Surgeon: Gaynelle Arabian, MD;  Location: [facility];  Service: Orthopedics;  Laterality: Right;    Social History:  reports that he has never smoked. He has never used smokeless tobacco. He reports that he drinks alcohol. He reports that he does not use illicit drugs.  Allergies: No Known Allergies   (Not in a hospital admission)  Blood pressure 131/73, pulse 86, resp. rate 22, SpO2 94 %. Physical Exam: General: pleasant, WD/WN white male who is laying in bed in NAD HEENT: head is normocephalic, atraumatic.  Sclera are noninjected.  PERRL.  Ears and nose without any masses or lesions.  Mouth is pink and moist Heart: regular, rate, and rhythm.  No obvious murmurs, gallops, or rubs noted.  Palpable pedal pulses bilaterally Lungs: CTAB, no wheezes, rhonchi, or rales noted.  Respiratory effort nonlabored Abd: soft, greatly distended, mildly tender throughout, diminished BS, no masses, hernias, or organomegaly, Well healed left inguinal scar MS: all 4 extremities are symmetrical with no cyanosis, clubbing.  Right knee with calor, but without erythema.  CSM intact to b/l LE.  No tenderness at the knee, no calf tenderness.   Skin: warm and dry with no masses, lesions, or rashes Psych: A&Ox3 with an  appropriate affect.   Results for orders placed or performed during the hospital encounter of 01/03/16 (from the past 48 hour(s))  CBC with Differential     Status: Abnormal   Collection Time: 01/03/16  6:17 AM  Result Value Ref Range   WBC 4.2 4.0 - 10.5 K/uL   RBC 3.47 (L) 4.22 - 5.81 MIL/uL   Hemoglobin 11.5 (L) 13.0 - 17.0 g/dL   HCT 32.1 (L) 39.0 - 52.0 %   MCV 92.5 78.0 - 100.0 fL   MCH 33.1 26.0 - 34.0 pg   MCHC 35.8 30.0 - 36.0 g/dL   RDW 12.7 11.5  - 15.5 %   Platelets 443 (H) 150 - 400 K/uL   Neutrophils Relative % 69 %   Neutro Abs 2.9 1.7 - 7.7 K/uL   Lymphocytes Relative 9 %   Lymphs Abs 0.4 (L) 0.7 - 4.0 K/uL   Monocytes Relative 21 %   Monocytes Absolute 0.9 0.1 - 1.0 K/uL   Eosinophils Relative 1 %   Eosinophils Absolute 0.0 0.0 - 0.7 K/uL   Basophils Relative 0 %   Basophils Absolute 0.0 0.0 - 0.1 K/uL  Comprehensive metabolic panel     Status: Abnormal   Collection Time: 01/03/16  6:17 AM  Result Value Ref Range   Sodium 124 (L) 135 - 145 mmol/L   Potassium 3.3 (L) 3.5 - 5.1 mmol/L   Chloride 89 (L) 101 - 111 mmol/L   CO2 23 22 - 32 mmol/L   Glucose, Bld 135 (H) 65 - 99 mg/dL   BUN 21 (H) 6 - 20 mg/dL   Creatinine, Ser 0.78 0.61 - 1.24 mg/dL   Calcium 8.4 (L) 8.9 - 10.3 mg/dL   Total Protein 6.1 (L) 6.5 - 8.1 g/dL   Albumin 3.1 (L) 3.5 - 5.0 g/dL   AST 20 15 - 41 U/L   ALT 17 17 - 63 U/L   Alkaline Phosphatase 47 38 - 126 U/L   Total Bilirubin 1.2 0.3 - 1.2 mg/dL   GFR calc non Af Amer >60 >60 mL/min   GFR calc Af Amer >60 >60 mL/min    Comment: (NOTE) The eGFR has been calculated using the CKD EPI equation. This calculation has not been validated in all clinical situations. eGFR's persistently <60 mL/min signify possible Chronic Kidney Disease.    Anion gap 12 5 - 15  Lipase, blood     Status: None   Collection Time: 01/03/16  6:17 AM  Result Value Ref Range   Lipase 15 11 - 51 U/L  I-Stat CG4 Lactic Acid, ED     Status: None   Collection Time: 01/03/16  6:36 AM  Result Value Ref Range   Lactic Acid, Venous 1.62 0.5 - 2.0 mmol/L   Ct Abdomen Pelvis W Contrast  01/03/2016  CLINICAL DATA:  Recent knee surgery, abdominal pain/vomiting, evaluate for obstruction EXAM: CT ABDOMEN AND PELVIS WITH CONTRAST TECHNIQUE: Multidetector CT imaging of the abdomen and pelvis was performed using the standard protocol following bolus administration of intravenous contrast. CONTRAST:  151m ISOVUE-300 IOPAMIDOL  (ISOVUE-300) INJECTION 61% COMPARISON:  None. FINDINGS: Lower chest: Mild patchy bilateral lower lobe opacities, likely atelectasis. Hepatobiliary: Liver is within normal limits. No suspicious/enhancing hepatic lesions. Gallbladder is unremarkable. No intrahepatic or extrahepatic ductal dilatation. Pancreas: Within normal limits. Spleen: Within normal limits. Adrenals/Urinary Tract: Adrenal glands are within normal limits. Kidneys are within normal limits.  No hydronephrosis. Bladder is within normal limits. Stomach/Bowel: Stomach is notable for a tiny  hiatal hernia. Multiple dilated loops of small bowel. Right colon is also dilated, including the ascending transverse, and descending colon. Sigmoid colon and rectum are mildly decompressed. If a transition point is present, it is in the left lower quadrant/ inguinal region (series 2/image 87), at the site of a prior inguinal hernia repair, although no definite hernia is present. Vascular/Lymphatic: Atherosclerotic calcifications of the abdominal aorta and branch vessels. No evidence of abdominal aortic aneurysm. No suspicious abdominopelvic lymphadenopathy. Reproductive: Prostate is grossly unremarkable. Other: Small volume perihepatic ascites.  Trace left pelvic ascites. Postsurgical changes related to prior bilateral inguinal hernia repairs. Musculoskeletal: Moderate degenerative changes of the visualized thoracolumbar spine. IMPRESSION: Dilated loops of small and large bowel. Relative decompression of the sigmoid colon and rectum. Overall, in the setting of recent surgery, adynamic postoperative ileus is suspected. Colonic obstruction with relative transition in the left inguinal region is considered less likely given the lack of a true left inguinal hernia on CT. Status post bilateral inguinal hernia repair. Electronically Signed   By: Julian Hy M.D.   On: 01/03/2016 08:51   Dg Abd Acute W/chest  01/03/2016  CLINICAL DATA:  Vomiting. Right knee  replacement 1 week ago. Nausea for 3 days, vomiting for 1 day, abdominal distention for 2 days. EXAM: DG ABDOMEN ACUTE W/ 1V CHEST COMPARISON:  None. FINDINGS: Low lung volumes with linear atelectasis or fibrosis in the lung bases. Probable emphysematous changes in the lungs. Normal heart size and pulmonary vascularity. No focal consolidation or airspace disease. Calcified and tortuous aorta. Diffuse gaseous distention of small bowel with multiple air-fluid levels. There is some prominence of gas-filled colon as well. Although these changes could represent ileus, the degree of small bowel dilatation is more than would be expected for ileus and obstruction is suggested. No free intra-abdominal air. No radiopaque stones. Degenerative changes in the spine and hips. Vascular calcifications. IMPRESSION: No evidence of active pulmonary disease. Prominent gas distended small bowel with air-fluid levels likely represent small bowel obstruction. There is some gas distention of colon is well. Level of obstruction is not indicated. Electronically Signed   By: Lucienne Capers M.D.   On: 01/03/2016 06:46      Assessment/Plan Bowel obstruction (?colonic) - transition present in the LLQ H/o left inguinal hernia repair - Dr. Hulen Skains 2013  Plan: 1.  Admit to Medicine, we will consult.  Small bowel protocol initiated.  Use narcotics sparingly 2.  NPO, bowel rest, IVF, pain control, antiemetics 3.  SCD's and DVT proph per medicine 4.  Ambulate and IS 5.  Hopefully this will resolve without surgical interventions, if not improving then may need surgical interventions, will follow.   Nat Christen, [facility] Surgery 01/03/2016, 10:11 AM Pager: [phone] (7am - 4:30pm M-F; 7am - 11:30am Sa/Su)'

## 2016-01-03 NOTE — ED Notes (Signed)
Attempted report 

## 2016-01-04 ENCOUNTER — Inpatient Hospital Stay (HOSPITAL_COMMUNITY): Payer: Medicare Other

## 2016-01-04 LAB — URINALYSIS, ROUTINE W REFLEX MICROSCOPIC
GLUCOSE, UA: NEGATIVE mg/dL
Hgb urine dipstick: NEGATIVE
KETONES UR: 15 mg/dL — AB
LEUKOCYTES UA: NEGATIVE
NITRITE: NEGATIVE
PROTEIN: NEGATIVE mg/dL
Specific Gravity, Urine: 1.043 — ABNORMAL HIGH (ref 1.005–1.030)
pH: 6 (ref 5.0–8.0)

## 2016-01-04 LAB — HEMOGLOBIN A1C
Hgb A1c MFr Bld: 5.4 % (ref 4.8–5.6)
Mean Plasma Glucose: 108 mg/dL

## 2016-01-04 LAB — COMPREHENSIVE METABOLIC PANEL
ALBUMIN: 2.8 g/dL — AB (ref 3.5–5.0)
ALK PHOS: 47 U/L (ref 38–126)
ALT: 19 U/L (ref 17–63)
AST: 23 U/L (ref 15–41)
Anion gap: 14 (ref 5–15)
BILIRUBIN TOTAL: 1.1 mg/dL (ref 0.3–1.2)
BUN: 24 mg/dL — AB (ref 6–20)
CALCIUM: 8.1 mg/dL — AB (ref 8.9–10.3)
CO2: 21 mmol/L — ABNORMAL LOW (ref 22–32)
CREATININE: 1 mg/dL (ref 0.61–1.24)
Chloride: 97 mmol/L — ABNORMAL LOW (ref 101–111)
GFR calc Af Amer: >60 mL/min (ref >60)
GFR calc non Af Amer: >60 mL/min (ref >60)
GLUCOSE: 132 mg/dL — AB (ref 65–99)
Potassium: 3.6 mmol/L (ref 3.5–5.1)
Sodium: 132 mmol/L — ABNORMAL LOW (ref 135–145)
TOTAL PROTEIN: 5.6 g/dL — AB (ref 6.5–8.1)

## 2016-01-04 LAB — CBC
HCT: 32 % — ABNORMAL LOW (ref 39.0–52.0)
HEMOGLOBIN: 10.9 g/dL — AB (ref 13.0–17.0)
MCH: 31.9 pg (ref 26.0–34.0)
MCHC: 34.1 g/dL (ref 30.0–36.0)
MCV: 93.6 fL (ref 78.0–100.0)
Platelets: 390 10*3/uL (ref 150–400)
RBC: 3.42 MIL/uL — AB (ref 4.22–5.81)
RDW: 12.8 % (ref 11.5–15.5)
WBC: 3.6 10*3/uL — AB (ref 4.0–10.5)

## 2016-01-04 NOTE — Evaluation (Signed)
Physical Therapy Evaluation Patient Details Name: Daniel Yu MRN: [id number] DOB: 1931-11-27 Today's Date: 01/04/2016   History of Present Illness  Pt is an 82 y.o. male admitted with SBO vs colonic obstruction vs ileus s/p TKA on 12/27/15. PMH: hypertension, asthma.   Clinical Impression  Pt admitted with above diagnosis. Pt currently with functional limitations due to the deficits listed below (see PT Problem List). Reviewed need for ambulation and continuation of TKA exercise program. Pt will benefit from skilled PT to increase their independence and safety with mobility to allow discharge to home with spouse to assist.     Follow Up Recommendations Home health PT;Supervision for mobility/OOB    Equipment Recommendations  None recommended by PT    Recommendations for Other Services       Precautions / Restrictions Precautions Precautions: Knee;Fall Precaution Comments: reviewed knee extension stretches Restrictions Weight Bearing Restrictions: No      Mobility  Bed Mobility Overal bed mobility: Needs Assistance Bed Mobility: Supine to Sit     Supine to sit: Min assist     General bed mobility comments: Min assist at trunk, cues to use rails.   Transfers Overall transfer level: Needs assistance Equipment used: Rolling walker (2 wheeled) Transfers: Sit to/from Stand Sit to Stand: Min guard         General transfer comment: cues for hand position, guard for safety.   Ambulation/Gait Ambulation/Gait assistance: Min guard Ambulation Distance (Feet): 150 Feet Assistive device: Rolling walker (2 wheeled) Gait Pattern/deviations: Step-through pattern;Trunk flexed Gait velocity: decreased   General Gait Details: cues for posture, lacking full extension with stance phase on Rt  Stairs            Wheelchair Mobility    Modified Rankin (Stroke Patients Only)       Balance Overall balance assessment: Needs assistance Sitting-balance support: No  upper extremity supported Sitting balance-Leahy Scale: Good     Standing balance support: Bilateral upper extremity supported Standing balance-Leahy Scale: Poor Standing balance comment: using rw for support                             Pertinent Vitals/Pain Pain Assessment: No/denies pain Pain Descriptors / Indicators:  (pt states knee is sore but wouldn't call it pain. ) Pain Intervention(s): Limited activity within patient's tolerance;Monitored during session    Home Living Family/patient expects to be discharged to:: Private residence Living Arrangements: Spouse/significant other Available Help at Discharge: Family;Available 24 hours/day Type of Home: House Home Access: Stairs to enter Entrance Stairs-Rails: None (getting some put up. ) Entrance Stairs-Number of Steps: 3-4 Home Layout: Two level;1/2 bath on main level Home Equipment: Crutches;Walker - 2 wheels;Toilet riser;Grab bars - tub/shower;Hospital bed Additional Comments: Patient's spouse reports that they have a hospital bed at home and pt is staying on first level of home.     Prior Function Level of Independence: Independent with assistive device(s)         Comments: using rw for ambulation     Hand Dominance        Extremity/Trunk Assessment   Upper Extremity Assessment: Overall WFL for tasks assessed           Lower Extremity Assessment: RLE deficits/detail RLE Deficits / Details: able to perform SLR without assistance.        Communication   Communication: No difficulties  Cognition Arousal/Alertness: Awake/alert Behavior During Therapy: WFL for tasks assessed/performed Overall Cognitive Status: Impaired/Different from  baseline Area of Impairment: Safety/judgement         Safety/Judgement: Decreased awareness of safety     General Comments: Patient's spouse reports that he is not quite like himself right now.     General Comments      Exercises Total Joint  Exercises Ankle Circles/Pumps: AROM;Both;10 reps Quad Sets: Strengthening;Right;10 reps Heel Slides: AAROM;Right;10 reps;Supine Straight Leg Raises: Strengthening;Right;10 reps Goniometric ROM: ROM - knee flexion approximately 90 degrees.       Assessment/Plan    PT Assessment Patient needs continued PT services  PT Diagnosis Difficulty walking   PT Problem List Decreased strength;Decreased range of motion;Decreased activity tolerance;Decreased balance;Decreased mobility  PT Treatment Interventions DME instruction;Gait training;Stair training;Functional mobility training;Therapeutic activities;Patient/family education;Balance training;Therapeutic exercise   PT Goals (Current goals can be found in the Care Plan section) Acute Rehab PT Goals Patient Stated Goal: get back on track with his knee PT Goal Formulation: With patient/family Time For Goal Achievement: 01/18/16 Potential to Achieve Goals: Good    Frequency Min 4X/week   Barriers to discharge        Co-evaluation               End of Session Equipment Utilized During Treatment: Gait belt Activity Tolerance: Patient tolerated treatment well Patient left: in chair;with call bell/phone within reach;with family/visitor present;Other (comment) (in knee extension) Nurse Communication: Mobility status         Time: [id number] PT Time Calculation (min) (ACUTE ONLY): 34 min   Charges:   PT Evaluation $PT Eval Moderate Complexity: 1 Procedure PT Treatments $Gait Training: 8-22 mins   PT G Codes:        Cassell Clement, PT, CSCS Pager [phone] Office [phone]  01/04/2016, 1:03 PM

## 2016-01-04 NOTE — Progress Notes (Signed)
Pt attempted to void at 1400 and unable to void.  Bladder scan showed 262cc.  MD notified and receivied order for I&O cath.  Only obtained 200cc via I&O cath at 1515.  MD notified.  Daniel Yu

## 2016-01-04 NOTE — Progress Notes (Signed)
Patient ID: Daniel Yu, male   DOB: November 05, 1931, 82 y.o.   MRN: [id number]    PROGRESS NOTE    Daniel Yu  [id number] DOB: [birth year] DOA: 01/03/2016  PCP: Irven Shelling, MD   Brief Narrative:  82 y/o white male with PMH OA s/p recent TKA on 12/27/15, prior left inguinal hernia repair. During the hospitalization patient did well and was discharged home w/ PT, right knee brace and Xarelto for DVT prophylaxis. Within 24 hours after discharging home patient began developing progressive abdominal bloating associated with N/V, abd discomfort.   Assessment & Plan:   Principal Problem:   SBO (small bowel obstruction) (HCC) - still with abd distension and very faint BS - continue with conservative management for now - keep NGT in place - lower the rate of IVF, continue with antiemetics as needed - appreciate surgery team following   Active Problems:   Dehydration with hyponatremia - IVF provided and Na is improving  - BMP In AM    S/P right knee surgery/TKA  - encouraged ambulation     Acute hypokalemia - resolved     Leukopenia - mild, reactive, CBC in AM  DVT prophylaxis: Heparin SQ Code Status: Full  Family Communication: Patient and family at bedside  Disposition Plan: Home when surgery team clears   Consultants:   Surgery   Procedures:   None  Antimicrobials:   None   Subjective: Still with abd discomfort.   Objective: Filed Vitals:   01/04/16 0547 01/04/16 0850 01/04/16 1143 01/04/16 1431  BP: 100/65  94/69 98/67  Pulse: 87  75 94  Temp: 97.7 F (36.5 C)  98.5 F (36.9 C) 97.7 F (36.5 C)  TempSrc: Oral  Oral Oral  Resp: 20  18 20   Weight:  90.765 kg (200 lb 1.6 oz)    SpO2: 95%  95% 93%    Intake/Output Summary (Last 24 hours) at 01/04/16 1645 Last data filed at 01/04/16 1515  Gross per 24 hour  Intake 2982.09 ml  Output   2852 ml  Net 130.09 ml   Filed Weights   01/04/16 0850  Weight: 90.765 kg (200 lb 1.6 oz)     Examination:  General exam: Appears calm and comfortable  Respiratory system: Clear to auscultation. Respiratory effort normal. Cardiovascular system: S1 & S2 heard, RRR. No JVD, murmurs, rubs, gallops or clicks. No pedal edema. Gastrointestinal system: Abdomen is distended, nontender. No organomegaly or masses felt. Very faint BS.  Central nervous system: Alert and oriented. No focal neurological deficits. Extremities: Symmetric 5 x 5 power. Skin: No rashes, lesions or ulcers Psychiatry: Judgement and insight appear normal. Mood & affect appropriate.    Data Reviewed: I have personally reviewed following labs and imaging studies  CBC:  Recent Labs Lab 12/29/15 0355 01/03/16 0617 01/04/16 0657  WBC 8.7 4.2 3.6*  NEUTROABS  --  2.9  --   HGB 10.4* 11.5* 10.9*  HCT 29.8* 32.1* 32.0*  MCV 94.6 92.5 93.6  PLT 243 443* [id number]   Basic Metabolic Panel:  Recent Labs Lab 12/29/15 0355 01/03/16 0617 01/03/16 1808 01/04/16 0657  NA 134* 124* 126* 132*  K 3.9 3.3* 3.6 3.6  CL 103 89* 92* 97*  CO2 24 23 25  21*  GLUCOSE 107* 135* 119* 132*  BUN 14 21* 20 24*  CREATININE 0.67 0.78 0.79 1.00  CALCIUM 8.2* 8.4* 8.0* 8.1*   Liver Function Tests:  Recent Labs Lab 01/03/16 0617 01/04/16 0657  AST 20 23  ALT 17 19  ALKPHOS 47 47  BILITOT 1.2 1.1  PROT 6.1* 5.6*  ALBUMIN 3.1* 2.8*    Recent Labs Lab 01/03/16 0617  LIPASE 15    Recent Labs  01/03/16 1808  HGBA1C 5.4      Component Value Date/Time   COLORURINE AMBER* 01/04/2016 0705   APPEARANCEUR CLEAR 01/04/2016 0705   LABSPEC 1.043* 01/04/2016 0705   PHURINE 6.0 01/04/2016 0705   GLUCOSEU NEGATIVE 01/04/2016 0705   HGBUR NEGATIVE 01/04/2016 0705   BILIRUBINUR SMALL* 01/04/2016 0705   KETONESUR 15* 01/04/2016 0705   PROTEINUR NEGATIVE 01/04/2016 0705   NITRITE NEGATIVE 01/04/2016 0705   LEUKOCYTESUR NEGATIVE 01/04/2016 0705    Radiology Studies: Dg Abd 1 View  01/04/2016  CLINICAL DATA:   Postoperative ileus.  Abdominal pain and distention. EXAM: ABDOMEN - 1 VIEW COMPARISON:  01/04/2016 abdominal radiograph. FINDINGS: There is persistent diffuse gaseous distention of the small and large bowel, not appreciably changed. No evidence of pneumatosis or pneumoperitoneum. No pathologic soft tissue calcifications. Marked degenerative changes throughout the visualized thoracolumbar spine. IMPRESSION: No appreciable change in diffuse gaseous distention of the small and large bowel, most consistent with adynamic ileus. Electronically Signed   By: Ilona Sorrel M.D.   On: 01/04/2016 08:29   Ct Abdomen Pelvis W Contrast  01/03/2016  CLINICAL DATA:  Recent knee surgery, abdominal pain/vomiting, evaluate for obstruction EXAM: CT ABDOMEN AND PELVIS WITH CONTRAST TECHNIQUE: Multidetector CT imaging of the abdomen and pelvis was performed using the standard protocol following bolus administration of intravenous contrast. CONTRAST:  165mL ISOVUE-300 IOPAMIDOL (ISOVUE-300) INJECTION 61% COMPARISON:  None. FINDINGS: Lower chest: Mild patchy bilateral lower lobe opacities, likely atelectasis. Hepatobiliary: Liver is within normal limits. No suspicious/enhancing hepatic lesions. Gallbladder is unremarkable. No intrahepatic or extrahepatic ductal dilatation. Pancreas: Within normal limits. Spleen: Within normal limits. Adrenals/Urinary Tract: Adrenal glands are within normal limits. Kidneys are within normal limits.  No hydronephrosis. Bladder is within normal limits. Stomach/Bowel: Stomach is notable for a tiny hiatal hernia. Multiple dilated loops of small bowel. Right colon is also dilated, including the ascending transverse, and descending colon. Sigmoid colon and rectum are mildly decompressed. If a transition point is present, it is in the left lower quadrant/ inguinal region (series 2/image 87), at the site of a prior inguinal hernia repair, although no definite hernia is present. Vascular/Lymphatic:  Atherosclerotic calcifications of the abdominal aorta and branch vessels. No evidence of abdominal aortic aneurysm. No suspicious abdominopelvic lymphadenopathy. Reproductive: Prostate is grossly unremarkable. Other: Small volume perihepatic ascites.  Trace left pelvic ascites. Postsurgical changes related to prior bilateral inguinal hernia repairs. Musculoskeletal: Moderate degenerative changes of the visualized thoracolumbar spine. IMPRESSION: Dilated loops of small and large bowel. Relative decompression of the sigmoid colon and rectum. Overall, in the setting of recent surgery, adynamic postoperative ileus is suspected. Colonic obstruction with relative transition in the left inguinal region is considered less likely given the lack of a true left inguinal hernia on CT. Status post bilateral inguinal hernia repair. Electronically Signed   By: Julian Hy M.D.   On: 01/03/2016 08:51   Dg Abd Acute W/chest  01/03/2016  CLINICAL DATA:  Vomiting. Right knee replacement 1 week ago. Nausea for 3 days, vomiting for 1 day, abdominal distention for 2 days. EXAM: DG ABDOMEN ACUTE W/ 1V CHEST COMPARISON:  None. FINDINGS: Low lung volumes with linear atelectasis or fibrosis in the lung bases. Probable emphysematous changes in the lungs. Normal heart size and pulmonary vascularity. No focal consolidation or  airspace disease. Calcified and tortuous aorta. Diffuse gaseous distention of small bowel with multiple air-fluid levels. There is some prominence of gas-filled colon as well. Although these changes could represent ileus, the degree of small bowel dilatation is more than would be expected for ileus and obstruction is suggested. No free intra-abdominal air. No radiopaque stones. Degenerative changes in the spine and hips. Vascular calcifications. IMPRESSION: No evidence of active pulmonary disease. Prominent gas distended small bowel with air-fluid levels likely represent small bowel obstruction. There is some gas  distention of colon is well. Level of obstruction is not indicated. Electronically Signed   By: Lucienne Capers M.D.   On: 01/03/2016 06:46   Dg Abd Portable 1v-small Bowel Obstruction Protocol-initial, 8 Hr Delay  01/04/2016  CLINICAL DATA:  Small bowel obstruction.  8 hour delayed. EXAM: PORTABLE ABDOMEN - 1 VIEW COMPARISON:  01/03/2016 FINDINGS: Prominent gaseous distention of small and large bowel most likely to represent adynamic ileus. Enteric tube is not visualized but may be off the field of view. Degenerative changes in the lumbar spine and hips. Residual contrast material in the bladder. IMPRESSION: Diffuse gaseous distention of small and large bowel most likely to represent ileus. Electronically Signed   By: Lucienne Capers M.D.   On: 01/04/2016 02:18   Dg Abd Portable 1v-small Bowel Protocol-position Verification  01/03/2016  CLINICAL DATA:  Followup small bowel obstruction. Nasogastric tube placement. EXAM: PORTABLE ABDOMEN - 1 VIEW 2:24 p.m.: COMPARISON:  CT abdomen and pelvis and acute abdomen series performed earlier today. FINDINGS: Nasogastric tube tip in the fundus of the stomach. Persistent marked dilation of multiple loops of small bowel throughout the abdomen and pelvis. Contrast from the CT earlier today within the normal-appearing urinary tract. IMPRESSION: Nasogastric tube tip in the fundus of the stomach. Persistent small bowel obstruction. Electronically Signed   By: Evangeline Dakin M.D.   On: 01/03/2016 14:36      Scheduled Meds: . heparin  5,000 Units Subcutaneous Q8H   Continuous Infusions: . 0.9 % NaCl with KCl 20 mEq / L 100 mL/hr at 01/04/16 1400     LOS: 1 day    Time spent: 20 minutes    Faye Ramsay, MD [facility] Pager [phone]  If 7PM-7AM, please contact night-coverage [email or web address] Password [facility] 01/04/2016, 4:45 PM

## 2016-01-04 NOTE — Progress Notes (Signed)
Pt's NGT fell out. Put new one in the left nare.

## 2016-01-04 NOTE — Progress Notes (Signed)
Subjective: NG came out last PM, replaced but not working this AM.  Sump full, and filter wet.  Small NG, He says he had a loose BM last PM.  He says he feels better.  Still very distended and no real flatus.  He says he got a shot and slept several hours.  He got ativan around 9 PM.  i have gotten NG working with new filter.    Objective: Vital signs in last 24 hours: Temp:  [97.7 F (36.5 C)-99 F (37.2 C)] 97.7 F (36.5 C) (05/16 0547) Pulse Rate:  [68-97] 87 (05/16 0547) Resp:  [12-20] 20 (05/16 0547) BP: (100-154)/(65-76) 100/65 mmHg (05/16 0547) SpO2:  [92 %-95 %] 95 % (05/16 0547) Last BM Date: 01/01/16 NPO 1500 per NG recorded Urine not recorded IV:  Recorded  2132 Afebrile, VSS NA 126 last PM NA up to 132 this AM,  H/H is stable 8 hour film shows:  Diffuse gaseous distention of small and large bowel most likely to represent ileus. Intake/Output from previous day: 05/15 0701 - 05/16 0700 In: 2132.1 [I.V.:2102.1; NG/GT:30] Out: 2301 [Urine:800; Emesis/NG output:1500; Stool:1] Intake/Output this shift:    General appearance: alert, cooperative and no distress Resp: clear to auscultation bilaterally GI: distended, NO BS, no flatus, did have some stool last PM.  NG in place, and appears functional now.    Lab Results:   Recent Labs  01/03/16 0617 01/04/16 0657  WBC 4.2 3.6*  HGB 11.5* 10.9*  HCT 32.1* 32.0*  PLT 443* 390    BMET  Recent Labs  01/03/16 1808 01/04/16 0657  NA 126* 132*  K 3.6 3.6  CL 92* 97*  CO2 25 21*  GLUCOSE 119* 132*  BUN 20 24*  CREATININE 0.79 1.00  CALCIUM 8.0* 8.1*   PT/INR No results for input(s): LABPROT, INR in the last 72 hours.   Recent Labs Lab 01/03/16 0617 01/04/16 0657  AST 20 23  ALT 17 19  ALKPHOS 47 47  BILITOT 1.2 1.1  PROT 6.1* 5.6*  ALBUMIN 3.1* 2.8*     Lipase     Component Value Date/Time   LIPASE 15 01/03/2016 0617     Studies/Results: Ct Abdomen Pelvis W Contrast  01/03/2016   CLINICAL DATA:  Recent knee surgery, abdominal pain/vomiting, evaluate for obstruction EXAM: CT ABDOMEN AND PELVIS WITH CONTRAST TECHNIQUE: Multidetector CT imaging of the abdomen and pelvis was performed using the standard protocol following bolus administration of intravenous contrast. CONTRAST:  145mL ISOVUE-300 IOPAMIDOL (ISOVUE-300) INJECTION 61% COMPARISON:  None. FINDINGS: Lower chest: Mild patchy bilateral lower lobe opacities, likely atelectasis. Hepatobiliary: Liver is within normal limits. No suspicious/enhancing hepatic lesions. Gallbladder is unremarkable. No intrahepatic or extrahepatic ductal dilatation. Pancreas: Within normal limits. Spleen: Within normal limits. Adrenals/Urinary Tract: Adrenal glands are within normal limits. Kidneys are within normal limits.  No hydronephrosis. Bladder is within normal limits. Stomach/Bowel: Stomach is notable for a tiny hiatal hernia. Multiple dilated loops of small bowel. Right colon is also dilated, including the ascending transverse, and descending colon. Sigmoid colon and rectum are mildly decompressed. If a transition point is present, it is in the left lower quadrant/ inguinal region (series 2/image 87), at the site of a prior inguinal hernia repair, although no definite hernia is present. Vascular/Lymphatic: Atherosclerotic calcifications of the abdominal aorta and branch vessels. No evidence of abdominal aortic aneurysm. No suspicious abdominopelvic lymphadenopathy. Reproductive: Prostate is grossly unremarkable. Other: Small volume perihepatic ascites.  Trace left pelvic ascites. Postsurgical changes related to  prior bilateral inguinal hernia repairs. Musculoskeletal: Moderate degenerative changes of the visualized thoracolumbar spine. IMPRESSION: Dilated loops of small and large bowel. Relative decompression of the sigmoid colon and rectum. Overall, in the setting of recent surgery, adynamic postoperative ileus is suspected. Colonic obstruction with  relative transition in the left inguinal region is considered less likely given the lack of a true left inguinal hernia on CT. Status post bilateral inguinal hernia repair. Electronically Signed   By: Julian Hy M.D.   On: 01/03/2016 08:51   Dg Abd Acute W/chest  01/03/2016  CLINICAL DATA:  Vomiting. Right knee replacement 1 week ago. Nausea for 3 days, vomiting for 1 day, abdominal distention for 2 days. EXAM: DG ABDOMEN ACUTE W/ 1V CHEST COMPARISON:  None. FINDINGS: Low lung volumes with linear atelectasis or fibrosis in the lung bases. Probable emphysematous changes in the lungs. Normal heart size and pulmonary vascularity. No focal consolidation or airspace disease. Calcified and tortuous aorta. Diffuse gaseous distention of small bowel with multiple air-fluid levels. There is some prominence of gas-filled colon as well. Although these changes could represent ileus, the degree of small bowel dilatation is more than would be expected for ileus and obstruction is suggested. No free intra-abdominal air. No radiopaque stones. Degenerative changes in the spine and hips. Vascular calcifications. IMPRESSION: No evidence of active pulmonary disease. Prominent gas distended small bowel with air-fluid levels likely represent small bowel obstruction. There is some gas distention of colon is well. Level of obstruction is not indicated. Electronically Signed   By: Lucienne Capers M.D.   On: 01/03/2016 06:46   Dg Abd Portable 1v-small Bowel Obstruction Protocol-initial, 8 Hr Delay  01/04/2016  CLINICAL DATA:  Small bowel obstruction.  8 hour delayed. EXAM: PORTABLE ABDOMEN - 1 VIEW COMPARISON:  01/03/2016 FINDINGS: Prominent gaseous distention of small and large bowel most likely to represent adynamic ileus. Enteric tube is not visualized but may be off the field of view. Degenerative changes in the lumbar spine and hips. Residual contrast material in the bladder. IMPRESSION: Diffuse gaseous distention of small  and large bowel most likely to represent ileus. Electronically Signed   By: Lucienne Capers M.D.   On: 01/04/2016 02:18   Dg Abd Portable 1v-small Bowel Protocol-position Verification  01/03/2016  CLINICAL DATA:  Followup small bowel obstruction. Nasogastric tube placement. EXAM: PORTABLE ABDOMEN - 1 VIEW 2:24 p.m.: COMPARISON:  CT abdomen and pelvis and acute abdomen series performed earlier today. FINDINGS: Nasogastric tube tip in the fundus of the stomach. Persistent marked dilation of multiple loops of small bowel throughout the abdomen and pelvis. Contrast from the CT earlier today within the normal-appearing urinary tract. IMPRESSION: Nasogastric tube tip in the fundus of the stomach. Persistent small bowel obstruction. Electronically Signed   By: Evangeline Dakin M.D.   On: 01/03/2016 14:36   Prior to Admission medications   Medication Sig Start Date End Date Taking? Authorizing Provider  acetaminophen (TYLENOL) 500 MG tablet Take 1,000 mg by mouth every 6 (six) hours as needed for mild pain.   Yes Historical Provider, MD  fluticasone (FLOVENT HFA) 110 MCG/ACT inhaler Inhale 2 puffs into the lungs 2 (two) times daily.   Yes Historical Provider, MD  rivaroxaban (XARELTO) 10 MG TABS tablet Take 1 tablet (10 mg total) by mouth daily with breakfast. Take Xarelto for two and a half more weeks, then discontinue Xarelto. Once the patient has completed the blood thinner regimen, then take a Baby 81 mg Aspirin daily for three more  weeks. 12/28/15  Yes Arlee Muslim, PA-C  valsartan-hydrochlorothiazide (DIOVAN-HCT) 160-12.5 MG per tablet Take 1 tablet by mouth every morning.   Yes Historical Provider, MD  cyclobenzaprine (FLEXERIL) 10 MG tablet Take 1 tablet (10 mg total) by mouth 3 (three) times daily as needed for muscle spasms. Patient not taking: Reported on 01/03/2016 12/28/15   Arlee Muslim, PA-C    Medications: . heparin  5,000 Units Subcutaneous Q8H   . 0.9 % NaCl with KCl 20 mEq / L 125 mL/hr at  01/03/16 2004   Assessment/Plan SBO vs colonic obstruction vs ileus s/p recent TKA on 12/27/15, Xarelto for DVT (last dose 12/31/15 - pt thinks, he's not sure) Hx of LIH repair Dr. Hulen Skains 2013 Hypertension Arthritis Asthma FEN:NPO/IV fluids ID:  None VTE:  Heparin   Plan:  I will get another film. Pt says they backed off of narcotics at home but I am still concerned this may be an ileus.  Electrolytes are better.  I will watch him closely, continue NG.  I don't see anything cardiac in his history but I will slow down the IV fluids for now.      LOS: 1 day    JENNINGS,WILLARD 01/04/2016 [phone]  Agree with above. Has air in rectum on KUB. Wife in room. He needs to move more.  Alphonsa Overall, MD, [facility] Pager: [phone] Office phone:  [phone]

## 2016-01-04 NOTE — Progress Notes (Deleted)
Physical Therapy Treatment Patient Details Name: Daniel Yu MRN: [id number] DOB: Dec 27, 1931 Today's Date: 01/04/2016    History of Present Illness Pt is an 82 y.o. male admitted with SBO vs colonic obstruction vs ileus s/p TKA on 12/27/15. PMH: hypertension, asthma.     PT Comments    Pt admitted with above diagnosis. Pt currently with functional limitations due to the deficits listed below (see PT Problem List). Reviewed need for ambulation and continuation of TKA exercise program. Pt will benefit from skilled PT to increase their independence and safety with mobility to allow discharge to home with spouse to assist.      Follow Up Recommendations  Home health PT;Supervision for mobility/OOB     Equipment Recommendations  None recommended by PT    Recommendations for Other Services       Precautions / Restrictions Precautions Precautions: Knee;Fall Precaution Comments: reviewed knee extension stretches Restrictions Weight Bearing Restrictions: No    Mobility  Bed Mobility Overal bed mobility: Needs Assistance Bed Mobility: Supine to Sit     Supine to sit: Min assist     General bed mobility comments: Min assist at trunk, cues to use rails.   Transfers Overall transfer level: Needs assistance Equipment used: Rolling walker (2 wheeled) Transfers: Sit to/from Stand Sit to Stand: Min guard         General transfer comment: cues for hand position, guard for safety.   Ambulation/Gait Ambulation/Gait assistance: Min guard Ambulation Distance (Feet): 150 Feet Assistive device: Rolling walker (2 wheeled) Gait Pattern/deviations: Step-through pattern;Trunk flexed Gait velocity: decreased   General Gait Details: cues for posture, lacking full extension with stance phase on Rt   Stairs            Wheelchair Mobility    Modified Rankin (Stroke Patients Only)       Balance Overall balance assessment: Needs assistance Sitting-balance support: No upper  extremity supported Sitting balance-Leahy Scale: Good     Standing balance support: Bilateral upper extremity supported Standing balance-Leahy Scale: Poor Standing balance comment: using rw for support                    Cognition Arousal/Alertness: Awake/alert Behavior During Therapy: WFL for tasks assessed/performed Overall Cognitive Status: Impaired/Different from baseline Area of Impairment: Safety/judgement         Safety/Judgement: Decreased awareness of safety     General Comments: Patient's spouse reports that he is not quite like himself right now.     Exercises Total Joint Exercises Ankle Circles/Pumps: AROM;Both;10 reps Quad Sets: Strengthening;Right;10 reps Heel Slides: AAROM;Right;10 reps;Supine Straight Leg Raises: Strengthening;Right;10 reps Goniometric ROM: ROM - knee flexion approximately 90 degrees.     General Comments        Pertinent Vitals/Pain Pain Assessment: No/denies pain Pain Descriptors / Indicators:  (pt states knee is sore but wouldn't call it pain. ) Pain Intervention(s): Limited activity within patient's tolerance;Monitored during session    Anton Ruiz expects to be discharged to:: Private residence Living Arrangements: Spouse/significant other Available Help at Discharge: Family;Available 24 hours/day Type of Home: House Home Access: Stairs to enter Entrance Stairs-Rails: None (getting some put up. ) Home Layout: Two level;1/2 bath on main level Home Equipment: Crutches;Walker - 2 wheels;Toilet riser;Grab bars - tub/shower;Hospital bed Additional Comments: Patient's spouse reports that they have a hospital bed at home and pt is staying on first level of home.     Prior Function Level of Independence: Independent with assistive device(s)  Comments: using rw for ambulation   PT Goals (current goals can now be found in the care plan section) Acute Rehab PT Goals Patient Stated Goal: get back on track  with his knee PT Goal Formulation: With patient/family Time For Goal Achievement: 01/18/16 Potential to Achieve Goals: Good    Frequency  Min 4X/week    PT Plan      Co-evaluation             End of Session Equipment Utilized During Treatment: Gait belt Activity Tolerance: Patient tolerated treatment well Patient left: in chair;with call bell/phone within reach;with family/visitor present;Other (comment) (in knee extension)     Time: [id number] PT Time Calculation (min) (ACUTE ONLY): 34 min  Charges:  $Gait Training: 8-22 mins                    G Codes:      Cassell Clement, PT, CSCS Pager [phone] Office [phone]  01/04/2016, 12:56 PM

## 2016-01-05 LAB — BASIC METABOLIC PANEL
Anion gap: 15 (ref 5–15)
BUN: 19 mg/dL (ref 6–20)
CHLORIDE: 99 mmol/L — AB (ref 101–111)
CO2: 23 mmol/L (ref 22–32)
CREATININE: 0.92 mg/dL (ref 0.61–1.24)
Calcium: 7.9 mg/dL — ABNORMAL LOW (ref 8.9–10.3)
Glucose, Bld: 80 mg/dL (ref 65–99)
POTASSIUM: 3.6 mmol/L (ref 3.5–5.1)
SODIUM: 137 mmol/L (ref 135–145)

## 2016-01-05 LAB — CBC
HCT: 29.5 % — ABNORMAL LOW (ref 39.0–52.0)
HEMOGLOBIN: 9.9 g/dL — AB (ref 13.0–17.0)
MCH: 32.2 pg (ref 26.0–34.0)
MCHC: 33.6 g/dL (ref 30.0–36.0)
MCV: 96.1 fL (ref 78.0–100.0)
PLATELETS: 441 10*3/uL — AB (ref 150–400)
RBC: 3.07 MIL/uL — AB (ref 4.22–5.81)
RDW: 13.1 % (ref 11.5–15.5)
WBC: 5.5 10*3/uL (ref 4.0–10.5)

## 2016-01-05 LAB — OCCULT BLOOD GASTRIC / DUODENUM (SPECIMEN CUP)
OCCULT BLOOD, GASTRIC: POSITIVE — AB
PH, GASTRIC: 4

## 2016-01-05 MED ORDER — PANTOPRAZOLE SODIUM 40 MG IV SOLR
40.0000 mg | Freq: Two times a day (BID) | INTRAVENOUS | Status: DC
  Administered 2016-01-05 – 2016-01-10 (×11): 40 mg via INTRAVENOUS
  Filled 2016-01-05 (×11): qty 40

## 2016-01-05 NOTE — Progress Notes (Signed)
PT Cancellation Note  Patient Details Name: Daniel Yu MRN: [id number] DOB: 12-Feb-1932   Cancelled Treatment:    Reason Eval/Treat Not Completed: Patient declined, no reason specifiedPt unable to ambulate for one hour per RN. Pt declined performing therex despite max encouragement from therapist and wife. PT will continue to follow acutely.    [place] April, PTA Pager: [phone]   01/05/2016, 3:44 PM

## 2016-01-05 NOTE — Progress Notes (Signed)
Subjective: He is better today, had a BM and has had some flatus, up walking some and feels much better.  He is less distended and feels better.    Objective: Vital signs in last 24 hours: Temp:  [97.1 F (36.2 C)-98.5 F (36.9 C)] 98.5 F (36.9 C) (05/17 0558) Pulse Rate:  [62-95] 93 (05/17 0558) Resp:  [16-20] 18 (05/17 0558) BP: (94-135)/(50-69) 134/63 mmHg (05/17 0558) SpO2:  [93 %-97 %] 97 % (05/17 0558) Weight:  [90.765 kg (200 lb 1.6 oz)-93.441 kg (206 lb)] 93.441 kg (206 lb) (05/17 0500) Last BM Date: 01/04/16 1700 from NG BM x 2 Afebrile, VSS Labs pending Intake/Output from previous day: 05/16 0701 - 05/17 0700 In: 2257.5 [I.V.:2257.5] Out: 1253 [Urine:201; Emesis/NG output:1050; Stool:2] Intake/Output this shift:    General appearance: alert, cooperative and no distress Resp: clear to auscultation bilaterally GI: soft, few hypoactive bowel sounds.  Some flatus and BM, still has allot of  dark drainage from the NG.   He is still a bit distended, but much better than yesterday.   Lab Results:   Recent Labs  01/03/16 0617 01/04/16 0657  WBC 4.2 3.6*  HGB 11.5* 10.9*  HCT 32.1* 32.0*  PLT 443* 390    BMET  Recent Labs  01/03/16 1808 01/04/16 0657  NA 126* 132*  K 3.6 3.6  CL 92* 97*  CO2 25 21*  GLUCOSE 119* 132*  BUN 20 24*  CREATININE 0.79 1.00  CALCIUM 8.0* 8.1*   PT/INR No results for input(s): LABPROT, INR in the last 72 hours.   Recent Labs Lab 01/03/16 0617 01/04/16 0657  AST 20 23  ALT 17 19  ALKPHOS 47 47  BILITOT 1.2 1.1  PROT 6.1* 5.6*  ALBUMIN 3.1* 2.8*     Lipase     Component Value Date/Time   LIPASE 15 01/03/2016 0617     Studies/Results: Dg Abd 1 View  01/04/2016  CLINICAL DATA:  Postoperative ileus.  Abdominal pain and distention. EXAM: ABDOMEN - 1 VIEW COMPARISON:  01/04/2016 abdominal radiograph. FINDINGS: There is persistent diffuse gaseous distention of the small and large bowel, not appreciably changed.  No evidence of pneumatosis or pneumoperitoneum. No pathologic soft tissue calcifications. Marked degenerative changes throughout the visualized thoracolumbar spine. IMPRESSION: No appreciable change in diffuse gaseous distention of the small and large bowel, most consistent with adynamic ileus. Electronically Signed   By: Ilona Sorrel M.D.   On: 01/04/2016 08:29   Ct Abdomen Pelvis W Contrast  01/03/2016  CLINICAL DATA:  Recent knee surgery, abdominal pain/vomiting, evaluate for obstruction EXAM: CT ABDOMEN AND PELVIS WITH CONTRAST TECHNIQUE: Multidetector CT imaging of the abdomen and pelvis was performed using the standard protocol following bolus administration of intravenous contrast. CONTRAST:  169mL ISOVUE-300 IOPAMIDOL (ISOVUE-300) INJECTION 61% COMPARISON:  None. FINDINGS: Lower chest: Mild patchy bilateral lower lobe opacities, likely atelectasis. Hepatobiliary: Liver is within normal limits. No suspicious/enhancing hepatic lesions. Gallbladder is unremarkable. No intrahepatic or extrahepatic ductal dilatation. Pancreas: Within normal limits. Spleen: Within normal limits. Adrenals/Urinary Tract: Adrenal glands are within normal limits. Kidneys are within normal limits.  No hydronephrosis. Bladder is within normal limits. Stomach/Bowel: Stomach is notable for a tiny hiatal hernia. Multiple dilated loops of small bowel. Right colon is also dilated, including the ascending transverse, and descending colon. Sigmoid colon and rectum are mildly decompressed. If a transition point is present, it is in the left lower quadrant/ inguinal region (series 2/image 87), at the site of a prior inguinal hernia  repair, although no definite hernia is present. Vascular/Lymphatic: Atherosclerotic calcifications of the abdominal aorta and branch vessels. No evidence of abdominal aortic aneurysm. No suspicious abdominopelvic lymphadenopathy. Reproductive: Prostate is grossly unremarkable. Other: Small volume perihepatic  ascites.  Trace left pelvic ascites. Postsurgical changes related to prior bilateral inguinal hernia repairs. Musculoskeletal: Moderate degenerative changes of the visualized thoracolumbar spine. IMPRESSION: Dilated loops of small and large bowel. Relative decompression of the sigmoid colon and rectum. Overall, in the setting of recent surgery, adynamic postoperative ileus is suspected. Colonic obstruction with relative transition in the left inguinal region is considered less likely given the lack of a true left inguinal hernia on CT. Status post bilateral inguinal hernia repair. Electronically Signed   By: Julian Hy M.D.   On: 01/03/2016 08:51   Dg Abd Portable 1v-small Bowel Obstruction Protocol-initial, 8 Hr Delay  01/04/2016  CLINICAL DATA:  Small bowel obstruction.  8 hour delayed. EXAM: PORTABLE ABDOMEN - 1 VIEW COMPARISON:  01/03/2016 FINDINGS: Prominent gaseous distention of small and large bowel most likely to represent adynamic ileus. Enteric tube is not visualized but may be off the field of view. Degenerative changes in the lumbar spine and hips. Residual contrast material in the bladder. IMPRESSION: Diffuse gaseous distention of small and large bowel most likely to represent ileus. Electronically Signed   By: Lucienne Capers M.D.   On: 01/04/2016 02:18   Dg Abd Portable 1v-small Bowel Protocol-position Verification  01/03/2016  CLINICAL DATA:  Followup small bowel obstruction. Nasogastric tube placement. EXAM: PORTABLE ABDOMEN - 1 VIEW 2:24 p.m.: COMPARISON:  CT abdomen and pelvis and acute abdomen series performed earlier today. FINDINGS: Nasogastric tube tip in the fundus of the stomach. Persistent marked dilation of multiple loops of small bowel throughout the abdomen and pelvis. Contrast from the CT earlier today within the normal-appearing urinary tract. IMPRESSION: Nasogastric tube tip in the fundus of the stomach. Persistent small bowel obstruction. Electronically Signed   By:  Evangeline Dakin M.D.   On: 01/03/2016 14:36    Medications: . heparin  5,000 Units Subcutaneous Q8H    Assessment/Plan SBO vs colonic obstruction vs ileus s/p recent TKA on 12/27/15, Xarelto for DVT (last dose 12/31/15 - pt thinks, he's not sure) Hx of LIH repair Dr. Hulen Skains 2013 Hypertension Arthritis Asthma FEN:NPO/IV fluids ID: None VTE: Heparin    Plan:  He is getting labs now.  I will try clamping trials with his NG and see how he does.  Give him some sips of clears this AM also.  I am not convinced he is ready, but will see how he does.  I am adding PPI, concerned NG drainage has blood in it based on the color.  Will check for occult blood this Am also.    LOS: 2 days    Chas Axel 01/05/2016 [phone]

## 2016-01-05 NOTE — Progress Notes (Signed)
Patient ID: Daniel Yu, male   DOB: 1932/05/02, 82 y.o.   MRN: [id number]    PROGRESS NOTE    Daniel Yu  [id number] DOB: 1931-12-04 DOA: 01/03/2016  PCP: Irven Shelling, MD   Brief Narrative:  82 y/o white male with PMH OA s/p recent TKA on 12/27/15, prior left inguinal hernia repair. During the hospitalization patient did well and was discharged home w/ PT, right knee brace and Xarelto for DVT prophylaxis. Within 24 hours after discharging home patient began developing progressive abdominal bloating associated with N/V, abd discomfort.   Assessment & Plan:   Principal Problem:   SBO (small bowel obstruction) (HCC) - abd distended but improved in the past 24 hours, flatus +, had BM last night  - continue with conservative management for now - keep NGT in place but may clamp per surgery today to see how pt does  - continue antiemetics as needed  - appreciate surgery team following   Active Problems:   Acute blood loss anemia - with recent surgery [name] and now some dilutional component - CBC pending this AM - monitor for now     Dehydration with hyponatremia - IVF provided and Na is improving  - BMP In AM    S/P right knee surgery/TKA  - encouraged ambulation     Acute hypokalemia - resolved     Leukopenia - mild, reactive, CBC pending this AM   DVT prophylaxis: Heparin SQ Code Status: Full  Family Communication: Patient and family at bedside  Disposition Plan: Home when surgery team clears   Consultants:   Surgery   Procedures:   None  Antimicrobials:   None  Subjective: Reports feeling better this AM.   Objective: Filed Vitals:   01/04/16 2135 01/05/16 0332 01/05/16 0500 01/05/16 0558  BP: 127/61 135/58  134/63  Pulse: 67 95  93  Temp: 98.4 F (36.9 C) 97.1 F (36.2 C)  98.5 F (36.9 C)  TempSrc: Oral Oral  Oral  Resp: 18 18  18   Weight:   93.441 kg (206 lb)   SpO2: 94% 97%  97%    Intake/Output Summary (Last 24 hours) at  01/05/16 0614 Last data filed at 01/05/16 0600  Gross per 24 hour  Intake 2257.5 ml  Output   2054 ml  Net  203.5 ml   Filed Weights   01/04/16 0850 01/05/16 0500  Weight: 90.765 kg (200 lb 1.6 oz) 93.441 kg (206 lb)    Examination:  General exam: Appears calm and comfortable  Respiratory system: Clear to auscultation. Respiratory effort normal. Cardiovascular system: S1 & S2 heard, RRR. No JVD, murmurs, rubs, gallops or clicks. No pedal edema. Gastrointestinal system: Abdomen is less distended, nontender. No organomegaly or masses felt. Has BS + Central nervous system: Alert and oriented. No focal neurological deficits. Extremities: Symmetric 5 x 5 power. Skin: No rashes, lesions or ulcers Psychiatry: Judgement and insight appear normal. Mood & affect appropriate.    Data Reviewed: I have personally reviewed following labs and imaging studies  CBC:  Recent Labs Lab 01/03/16 0617 01/04/16 0657  WBC 4.2 3.6*  NEUTROABS 2.9  --   HGB 11.5* 10.9*  HCT 32.1* 32.0*  MCV 92.5 93.6  PLT 443* [id number]   Basic Metabolic Panel:  Recent Labs Lab 01/03/16 0617 01/03/16 1808 01/04/16 0657  NA 124* 126* 132*  K 3.3* 3.6 3.6  CL 89* 92* 97*  CO2 23 25 21*  GLUCOSE 135* 119* 132*  BUN 21*  20 24*  CREATININE 0.78 0.79 1.00  CALCIUM 8.4* 8.0* 8.1*   Liver Function Tests:  Recent Labs Lab 01/03/16 0617 01/04/16 0657  AST 20 23  ALT 17 19  ALKPHOS 47 47  BILITOT 1.2 1.1  PROT 6.1* 5.6*  ALBUMIN 3.1* 2.8*    Recent Labs Lab 01/03/16 0617  LIPASE 15    Recent Labs  01/03/16 1808  HGBA1C 5.4      Component Value Date/Time   COLORURINE AMBER* 01/04/2016 0705   APPEARANCEUR CLEAR 01/04/2016 0705   LABSPEC 1.043* 01/04/2016 0705   PHURINE 6.0 01/04/2016 0705   GLUCOSEU NEGATIVE 01/04/2016 0705   HGBUR NEGATIVE 01/04/2016 0705   BILIRUBINUR SMALL* 01/04/2016 0705   KETONESUR 15* 01/04/2016 0705   PROTEINUR NEGATIVE 01/04/2016 0705   NITRITE NEGATIVE  01/04/2016 0705   LEUKOCYTESUR NEGATIVE 01/04/2016 0705    Radiology Studies: Dg Abd 1 View  01/04/2016  CLINICAL DATA:  Postoperative ileus.  Abdominal pain and distention. EXAM: ABDOMEN - 1 VIEW COMPARISON:  01/04/2016 abdominal radiograph. FINDINGS: There is persistent diffuse gaseous distention of the small and large bowel, not appreciably changed. No evidence of pneumatosis or pneumoperitoneum. No pathologic soft tissue calcifications. Marked degenerative changes throughout the visualized thoracolumbar spine. IMPRESSION: No appreciable change in diffuse gaseous distention of the small and large bowel, most consistent with adynamic ileus. Electronically Signed   By: Ilona Sorrel M.D.   On: 01/04/2016 08:29   Ct Abdomen Pelvis W Contrast  01/03/2016  CLINICAL DATA:  Recent knee surgery, abdominal pain/vomiting, evaluate for obstruction EXAM: CT ABDOMEN AND PELVIS WITH CONTRAST TECHNIQUE: Multidetector CT imaging of the abdomen and pelvis was performed using the standard protocol following bolus administration of intravenous contrast. CONTRAST:  148mL ISOVUE-300 IOPAMIDOL (ISOVUE-300) INJECTION 61% COMPARISON:  None. FINDINGS: Lower chest: Mild patchy bilateral lower lobe opacities, likely atelectasis. Hepatobiliary: Liver is within normal limits. No suspicious/enhancing hepatic lesions. Gallbladder is unremarkable. No intrahepatic or extrahepatic ductal dilatation. Pancreas: Within normal limits. Spleen: Within normal limits. Adrenals/Urinary Tract: Adrenal glands are within normal limits. Kidneys are within normal limits.  No hydronephrosis. Bladder is within normal limits. Stomach/Bowel: Stomach is notable for a tiny hiatal hernia. Multiple dilated loops of small bowel. Right colon is also dilated, including the ascending transverse, and descending colon. Sigmoid colon and rectum are mildly decompressed. If a transition point is present, it is in the left lower quadrant/ inguinal region (series 2/image  87), at the site of a prior inguinal hernia repair, although no definite hernia is present. Vascular/Lymphatic: Atherosclerotic calcifications of the abdominal aorta and branch vessels. No evidence of abdominal aortic aneurysm. No suspicious abdominopelvic lymphadenopathy. Reproductive: Prostate is grossly unremarkable. Other: Small volume perihepatic ascites.  Trace left pelvic ascites. Postsurgical changes related to prior bilateral inguinal hernia repairs. Musculoskeletal: Moderate degenerative changes of the visualized thoracolumbar spine. IMPRESSION: Dilated loops of small and large bowel. Relative decompression of the sigmoid colon and rectum. Overall, in the setting of recent surgery, adynamic postoperative ileus is suspected. Colonic obstruction with relative transition in the left inguinal region is considered less likely given the lack of a true left inguinal hernia on CT. Status post bilateral inguinal hernia repair. Electronically Signed   By: Julian Hy M.D.   On: 01/03/2016 08:51   Dg Abd Acute W/chest  01/03/2016  CLINICAL DATA:  Vomiting. Right knee replacement 1 week ago. Nausea for 3 days, vomiting for 1 day, abdominal distention for 2 days. EXAM: DG ABDOMEN ACUTE W/ 1V CHEST COMPARISON:  None. FINDINGS: Low lung volumes with linear atelectasis or fibrosis in the lung bases. Probable emphysematous changes in the lungs. Normal heart size and pulmonary vascularity. No focal consolidation or airspace disease. Calcified and tortuous aorta. Diffuse gaseous distention of small bowel with multiple air-fluid levels. There is some prominence of gas-filled colon as well. Although these changes could represent ileus, the degree of small bowel dilatation is more than would be expected for ileus and obstruction is suggested. No free intra-abdominal air. No radiopaque stones. Degenerative changes in the spine and hips. Vascular calcifications. IMPRESSION: No evidence of active pulmonary disease.  Prominent gas distended small bowel with air-fluid levels likely represent small bowel obstruction. There is some gas distention of colon is well. Level of obstruction is not indicated. Electronically Signed   By: Lucienne Capers M.D.   On: 01/03/2016 06:46   Dg Abd Portable 1v-small Bowel Obstruction Protocol-initial, 8 Hr Delay  01/04/2016  CLINICAL DATA:  Small bowel obstruction.  8 hour delayed. EXAM: PORTABLE ABDOMEN - 1 VIEW COMPARISON:  01/03/2016 FINDINGS: Prominent gaseous distention of small and large bowel most likely to represent adynamic ileus. Enteric tube is not visualized but may be off the field of view. Degenerative changes in the lumbar spine and hips. Residual contrast material in the bladder. IMPRESSION: Diffuse gaseous distention of small and large bowel most likely to represent ileus. Electronically Signed   By: Lucienne Capers M.D.   On: 01/04/2016 02:18   Dg Abd Portable 1v-small Bowel Protocol-position Verification  01/03/2016  CLINICAL DATA:  Followup small bowel obstruction. Nasogastric tube placement. EXAM: PORTABLE ABDOMEN - 1 VIEW 2:24 p.m.: COMPARISON:  CT abdomen and pelvis and acute abdomen series performed earlier today. FINDINGS: Nasogastric tube tip in the fundus of the stomach. Persistent marked dilation of multiple loops of small bowel throughout the abdomen and pelvis. Contrast from the CT earlier today within the normal-appearing urinary tract. IMPRESSION: Nasogastric tube tip in the fundus of the stomach. Persistent small bowel obstruction. Electronically Signed   By: Evangeline Dakin M.D.   On: 01/03/2016 14:36      Scheduled Meds: . heparin  5,000 Units Subcutaneous Q8H   Continuous Infusions: . 0.9 % NaCl with KCl 20 mEq / L 75 mL/hr at 01/04/16 2218     LOS: 2 days    Time spent: 20 minutes    Faye Ramsay, MD [facility] Pager [phone]  If 7PM-7AM, please contact night-coverage [email or web address] Password [facility] 01/05/2016,  6:14 AM

## 2016-01-05 NOTE — Care Management Note (Signed)
Case Management Note  Patient Details  Name: KACHE SEBESTYEN MRN: [id number] Date of Birth: 04/05/1932  Subjective/Objective:                    Action/Plan:   Expected Discharge Date:  01/04/16               Expected Discharge Plan:  [place]  In-House Referral:     Discharge planning Services  CM Consult  Post Acute Care Choice:  Home Health Choice offered to:  Patient  DME Arranged:    DME Agency:     HH Arranged:  PT HH Agency:  [place]  Status of Service:  Completed, signed off  Medicare Important Message Given:    Date Medicare IM Given:    Medicare IM give by:    Date Additional Medicare IM Given:    Additional Medicare Important Message give by:     If discussed at [place] of Stay Meetings, dates discussed:    Additional Comments:  Marilu Favre, RN 01/05/2016, 11:29 AM

## 2016-01-06 ENCOUNTER — Inpatient Hospital Stay (HOSPITAL_COMMUNITY): Payer: Medicare Other

## 2016-01-06 LAB — BASIC METABOLIC PANEL
ANION GAP: 14 (ref 5–15)
BUN: 13 mg/dL (ref 6–20)
CALCIUM: 7.7 mg/dL — AB (ref 8.9–10.3)
CO2: 19 mmol/L — ABNORMAL LOW (ref 22–32)
Chloride: 102 mmol/L (ref 101–111)
Creatinine, Ser: 0.84 mg/dL (ref 0.61–1.24)
GFR calc Af Amer: >60 mL/min (ref >60)
GLUCOSE: 96 mg/dL (ref 65–99)
POTASSIUM: 3.5 mmol/L (ref 3.5–5.1)
SODIUM: 135 mmol/L (ref 135–145)

## 2016-01-06 LAB — OCCULT BLOOD GASTRIC / DUODENUM (SPECIMEN CUP): Occult Blood, Gastric: POSITIVE — AB

## 2016-01-06 LAB — CBC
HCT: 31.1 % — ABNORMAL LOW (ref 39.0–52.0)
Hemoglobin: 10.4 g/dL — ABNORMAL LOW (ref 13.0–17.0)
MCH: 31.9 pg (ref 26.0–34.0)
MCHC: 33.4 g/dL (ref 30.0–36.0)
MCV: 95.4 fL (ref 78.0–100.0)
PLATELETS: 436 10*3/uL — AB (ref 150–400)
RBC: 3.26 MIL/uL — AB (ref 4.22–5.81)
RDW: 13 % (ref 11.5–15.5)
WBC: 8.7 10*3/uL (ref 4.0–10.5)

## 2016-01-06 MED ORDER — CHLORPROMAZINE HCL 25 MG/ML IJ SOLN
25.0000 mg | Freq: Once | INTRAMUSCULAR | Status: AC
  Administered 2016-01-06: 25 mg via INTRAMUSCULAR
  Filled 2016-01-06: qty 1

## 2016-01-06 MED ORDER — CHLORPROMAZINE HCL 25 MG/ML IJ SOLN
25.0000 mg | Freq: Three times a day (TID) | INTRAMUSCULAR | Status: DC | PRN
  Filled 2016-01-06: qty 2

## 2016-01-06 MED ORDER — METOCLOPRAMIDE HCL 5 MG/ML IJ SOLN
5.0000 mg | Freq: Four times a day (QID) | INTRAMUSCULAR | Status: DC
Start: 2016-01-06 — End: 2016-01-08
  Administered 2016-01-06 – 2016-01-08 (×8): 5 mg via INTRAVENOUS
  Filled 2016-01-06 (×8): qty 2

## 2016-01-06 NOTE — Progress Notes (Signed)
Physical Therapy Treatment Patient Details Name: Daniel Yu MRN: [id number] DOB: 09-Feb-1932 Today's Date: 01/06/2016    History of Present Illness Pt is an 82 y.o. male admitted with SBO vs colonic obstruction vs ileus s/p TKA on 12/27/15. PMH: hypertension, asthma.     PT Comments    Pt performed increased gait and more motivated this session.  Will f/u in pm to progress to pain if time permits.  Pt progressing well towards goals at this time.  Pt reports he will receive CPM this pm.    Follow Up Recommendations  Home health PT;Supervision for mobility/OOB     Equipment Recommendations  None recommended by PT    Recommendations for Other Services OT consult     Precautions / Restrictions Precautions Precautions: Knee;Fall Precaution Comments: reviewed knee extension  Restrictions Weight Bearing Restrictions: No    Mobility  Bed Mobility Overal bed mobility: Needs Assistance Bed Mobility: Supine to Sit     Supine to sit: Supervision     General bed mobility comments: Cues for technique.  Transfers Overall transfer level: Needs assistance Equipment used: Rolling walker (2 wheeled) Transfers: Sit to/from Stand Sit to Stand: Supervision         General transfer comment: cues for hand position, guard for safety.   Ambulation/Gait Ambulation/Gait assistance: Supervision Ambulation Distance (Feet): 600 Feet Assistive device: Rolling walker (2 wheeled) Gait Pattern/deviations: Step-through pattern;Trunk flexed Gait velocity: decreased   General Gait Details: cues for posture, lacking full extension with stance phase on Rt   Stairs            Wheelchair Mobility    Modified Rankin (Stroke Patients Only)       Balance Overall balance assessment: Needs assistance Sitting-balance support: No upper extremity supported Sitting balance-Leahy Scale: Good       Standing balance-Leahy Scale: Poor                      Cognition  Arousal/Alertness: Awake/alert Behavior During Therapy: WFL for tasks assessed/performed Overall Cognitive Status: Within Functional Limits for tasks assessed                      Exercises Total Joint Exercises Ankle Circles/Pumps: AROM;Both;10 reps;Supine Quad Sets: Strengthening;Right;10 reps;Supine Short Arc Quad: AROM;Right;10 reps Heel Slides: Right;10 reps;Supine;AROM Hip ABduction/ADduction: AROM;Right;10 reps;Supine Straight Leg Raises: Strengthening;Right;10 reps;Supine Long Arc Quad: AROM;Right;10 reps;Supine Knee Flexion: AROM;AAROM;Right;5 reps (1x5 AROM and 1x5 AAROM with 10 sec hold.) Goniometric ROM: greater than 90 degrees.      General Comments        Pertinent Vitals/Pain Pain Assessment: No/denies pain Pain Score: 2  Pain Location: R knee Pain Descriptors / Indicators: Sore Pain Intervention(s): Limited activity within patient's tolerance;Monitored during session    Home Living                      Prior Function            PT Goals (current goals can now be found in the care plan section) Acute Rehab PT Goals Patient Stated Goal: get back on track with his knee Potential to Achieve Goals: Good Progress towards PT goals: Progressing toward goals    Frequency  Min 4X/week    PT Plan Current plan remains appropriate    Co-evaluation             End of Session Equipment Utilized During Treatment: Gait belt Activity Tolerance: Patient tolerated treatment well  Patient left: in chair;with call bell/phone within reach;with family/visitor present;Other (comment)     Time: [id number] PT Time Calculation (min) (ACUTE ONLY): 25 min  Charges:  $Gait Training: 8-22 mins $Therapeutic Exercise: 8-22 mins                    G Codes:      Cristela Blue 21-Jan-2016, 1:10 PM  Governor Rooks, PTA pager [phone]

## 2016-01-06 NOTE — Progress Notes (Signed)
Subjective: His wife said yesterday was his worst day.  He did well with the sips and it made him more comfortable,but distension persist, even after having a couple BM's. I have no record of how much he took PO, but wife estimate about 4 cups.    Objective: Vital signs in last 24 hours: Temp:  [98.3 F (36.8 C)-98.9 F (37.2 C)] 98.9 F (37.2 C) (05/18 0550) Pulse Rate:  [71-100] 89 (05/18 0550) Resp:  [18-20] 19 (05/18 0550) BP: (131-143)/(67-81) 143/81 mmHg (05/18 0550) SpO2:  [96 %-98 %] 96 % (05/18 0550) Weight:  [92.565 kg (204 lb 1.1 oz)] 92.565 kg (204 lb 1.1 oz) (05/18 0550) Last BM Date: 01/05/16 With clamping trails 900 from the NG recorded BM x 2 Afebrile, VSS Labs stable,  Gastric occult was positive Intake/Output from previous day: 05/17 0701 - 05/18 0700 In: 1290.4 [I.V.:1290.4] Out: 900 [Emesis/NG output:900] Intake/Output this shift:    General appearance: alert, cooperative and no distress GI: still distended, BS hypoactive, and still high pitched.  not really tender on palpation.    Lab Results:   Recent Labs  01/05/16 0747 01/06/16 0642  WBC 5.5 8.7  HGB 9.9* 10.4*  HCT 29.5* 31.1*  PLT 441* 436*    BMET  Recent Labs  01/05/16 0747 01/06/16 0642  NA 137 135  K 3.6 3.5  CL 99* 102  CO2 23 19*  GLUCOSE 80 96  BUN 19 13  CREATININE 0.92 0.84  CALCIUM 7.9* 7.7*   PT/INR No results for input(s): LABPROT, INR in the last 72 hours.   Recent Labs Lab 01/03/16 0617 01/04/16 0657  AST 20 23  ALT 17 19  ALKPHOS 47 47  BILITOT 1.2 1.1  PROT 6.1* 5.6*  ALBUMIN 3.1* 2.8*     Lipase     Component Value Date/Time   LIPASE 15 01/03/2016 0617     Studies/Results: Dg Abd 1 View  01/04/2016  CLINICAL DATA:  Postoperative ileus.  Abdominal pain and distention. EXAM: ABDOMEN - 1 VIEW COMPARISON:  01/04/2016 abdominal radiograph. FINDINGS: There is persistent diffuse gaseous distention of the small and large bowel, not appreciably  changed. No evidence of pneumatosis or pneumoperitoneum. No pathologic soft tissue calcifications. Marked degenerative changes throughout the visualized thoracolumbar spine. IMPRESSION: No appreciable change in diffuse gaseous distention of the small and large bowel, most consistent with adynamic ileus. Electronically Signed   By: Ilona Sorrel M.D.   On: 01/04/2016 08:29    Medications: . heparin  5,000 Units Subcutaneous Q8H  . pantoprazole (PROTONIX) IV  40 mg Intravenous Q12H    Assessment/Plan SBO vs colonic obstruction vs ileus s/p recent TKA on 12/27/15, Xarelto for DVT (last dose 12/31/15 - pt thinks, he's not sure) Hx of LIH repair Dr. Hulen Skains 2013 Hypertension Arthritis Asthma FEN:NPO/IV fluids ID: None VTE: Heparin   Plan:  He has been on suction since MN.  I will leave him on suction, and check film.  NG drainage is clearer this AM and H/H is stable, so I don't think he has a GI bleed.  I will get a film on him and plan to stay the course for now.  Wife is concerned with lack of exercise for his knee.  I will check with PT and ask them to tell me what other interventions we can use.  He has qid ambulation order, but that is not happening.  Nor is intake recordings.   Film not read yet but he is still markedly  distended  With some ongoing air fluid levels.  Discussed with Dr.Toth and we will try some low dose Reglan.    LOS: 3 days    Kordelia Severin 01/06/2016 [id number]

## 2016-01-06 NOTE — Progress Notes (Signed)
Patient's NG clamped from 1630 to 2235.  NG to LIWS from 2235 to 2335.  300cc dark brown output obtained.  NG left to LIWS.

## 2016-01-06 NOTE — Progress Notes (Signed)
Patient ID: ADE SCHETTLER, male   DOB: [birth year], 82 y.o.   MRN: [id number]    PROGRESS NOTE    BASTIAN BALSBAUGH  [id number] DOB: September 30, 1931 DOA: 01/03/2016  PCP: Irven Shelling, MD   Brief Narrative:  82 y/o white male with PMH OA s/p recent TKA on 12/27/15, prior left inguinal hernia repair. During the hospitalization patient did well and was discharged home w/ PT, right knee brace and Xarelto for DVT prophylaxis. Within 24 hours after discharging home patient began developing progressive abdominal bloating associated with N/V, abd discomfort.   Assessment & Plan:   Principal Problem:   SBO (small bowel obstruction) (HCC) - abd distended, NGT on suction again - continue with conservative management for now - continue antiemetics as needed  - appreciate surgery team following   Active Problems:   Hiccups - suspect related to the above and significant gaseous distension - will add Thorazine as needed     Acute blood loss anemia - with recent surgery [name] and now some dilutional component - hg stable this AM, no signs of bleeding - CBC in AM    Dehydration with hyponatremia - IVF provided and Na is improving  - BMP In AM    S/P right knee surgery/TKA  - PT eval requested     Acute hypokalemia - resolved     Leukopenia - resolved   DVT prophylaxis: Heparin SQ Code Status: Full  Family Communication: Patient and family at bedside  Disposition Plan: Home when surgery team clears   Consultants:   Surgery   Procedures:   None  Antimicrobials:   None  Subjective: Reports feeling better this AM.   Objective: Filed Vitals:   01/05/16 0558 01/05/16 1430 01/05/16 2141 01/06/16 0550  BP: 134/63 131/68 137/67 143/81  Pulse: 93 71 100 89  Temp: 98.5 F (36.9 C) 98.3 F (36.8 C) 98.3 F (36.8 C) 98.9 F (37.2 C)  TempSrc: Oral Oral Oral   Resp: 18 18 20 19   Weight:    92.565 kg (204 lb 1.1 oz)  SpO2: 97% 98% 98% 96%    Intake/Output Summary (Last  24 hours) at 01/06/16 1438 Last data filed at 01/06/16 1200  Gross per 24 hour  Intake 1250.83 ml  Output    750 ml  Net 500.83 ml   Filed Weights   01/04/16 0850 01/05/16 0500 01/06/16 0550  Weight: 90.765 kg (200 lb 1.6 oz) 93.441 kg (206 lb) 92.565 kg (204 lb 1.1 oz)    Examination:  General exam: Appears calm and comfortable  Respiratory system: Clear to auscultation. Respiratory effort normal. Cardiovascular system: S1 & S2 heard, RRR. No JVD, murmurs, rubs, gallops or clicks. No pedal edema. Gastrointestinal system: Abdomen is less distended, nontender. No organomegaly or masses felt. Has BS + Central nervous system: Alert and oriented. No focal neurological deficits. Extremities: Symmetric 5 x 5 power. Skin: No rashes, lesions or ulcers Psychiatry: Judgement and insight appear normal. Mood & affect appropriate.    Data Reviewed: I have personally reviewed following labs and imaging studies  CBC:  Recent Labs Lab 01/03/16 0617 01/04/16 0657 01/05/16 0747 01/06/16 0642  WBC 4.2 3.6* 5.5 8.7  NEUTROABS 2.9  --   --   --   HGB 11.5* 10.9* 9.9* 10.4*  HCT 32.1* 32.0* 29.5* 31.1*  MCV 92.5 93.6 96.1 95.4  PLT 443* 390 441* [id number]*   Basic Metabolic Panel:  Recent Labs Lab 01/03/16 0617 01/03/16 1808 01/04/16 0657 01/05/16 0747  01/06/16 0642  NA 124* 126* 132* 137 135  K 3.3* 3.6 3.6 3.6 3.5  CL 89* 92* 97* 99* 102  CO2 23 25 21* 23 19*  GLUCOSE 135* 119* 132* 80 96  BUN 21* 20 24* 19 13  CREATININE 0.78 0.79 1.00 0.92 0.84  CALCIUM 8.4* 8.0* 8.1* 7.9* 7.7*   Liver Function Tests:  Recent Labs Lab 01/03/16 0617 01/04/16 0657  AST 20 23  ALT 17 19  ALKPHOS 47 47  BILITOT 1.2 1.1  PROT 6.1* 5.6*  ALBUMIN 3.1* 2.8*    Recent Labs Lab 01/03/16 0617  LIPASE 15    Recent Labs  01/03/16 1808  HGBA1C 5.4      Component Value Date/Time   COLORURINE AMBER* 01/04/2016 0705   APPEARANCEUR CLEAR 01/04/2016 0705   LABSPEC 1.043* 01/04/2016 0705     PHURINE 6.0 01/04/2016 0705   GLUCOSEU NEGATIVE 01/04/2016 0705   HGBUR NEGATIVE 01/04/2016 0705   BILIRUBINUR SMALL* 01/04/2016 0705   KETONESUR 15* 01/04/2016 0705   PROTEINUR NEGATIVE 01/04/2016 0705   NITRITE NEGATIVE 01/04/2016 0705   LEUKOCYTESUR NEGATIVE 01/04/2016 0705    Radiology Studies: Dg Abd 2 Views  01/06/2016  CLINICAL DATA:  Postoperative day 7 status post total knee arthroplasty complicated by postoperative ileus. EXAM: ABDOMEN - 2 VIEW COMPARISON:  01/04/2016 abdominal radiograph. FINDINGS: Enteric tube terminates in the proximal stomach. Diffuse mild to moderate gaseous distention of the small and large bowel with air-fluid levels throughout the small and large bowel, with the degree of dilatation not appreciably changed. No evidence of pneumatosis or pneumoperitoneum. Vascular calcifications in the pelvis. Trace right pleural effusion. Moderate degenerative changes in the visualized thoracolumbar spine. IMPRESSION: Diffuse mild to moderate gaseous distention of the small and large bowel with air-fluid levels, consistent with adynamic ileus, with no appreciable radiographic improvement. Electronically Signed   By: Ilona Sorrel M.D.   On: 01/06/2016 09:52      Scheduled Meds: . heparin  5,000 Units Subcutaneous Q8H  . metoCLOPramide (REGLAN) injection  5 mg Intravenous Q6H  . pantoprazole (PROTONIX) IV  40 mg Intravenous Q12H   Continuous Infusions: . 0.9 % NaCl with KCl 20 mEq / L 1 mL (01/06/16 1135)     LOS: 3 days    Time spent: 20 minutes    Faye Ramsay, MD [facility] Pager [phone]  If 7PM-7AM, please contact night-coverage [email or web address] Password [facility] 01/06/2016, 2:38 PM

## 2016-01-06 NOTE — Progress Notes (Signed)
Discussed with Dr Les Pou, and have placed him on CPM 0-60 degrees for 4 hours.  DVT with heparin OK for hospital anticoagulation

## 2016-01-06 NOTE — Progress Notes (Signed)
Orthopedic Tech Progress Note Patient Details:  Daniel Yu [facility] 1931/09/01 [id number]  CPM Right Knee CPM Right Knee: On Right Knee Flexion (Degrees): 90 Right Knee Extension (Degrees): 0   Daniel Yu 01/06/2016, 1:48 PM

## 2016-01-07 ENCOUNTER — Inpatient Hospital Stay (HOSPITAL_COMMUNITY): Payer: Medicare Other

## 2016-01-07 LAB — CBC
HCT: 30.8 % — ABNORMAL LOW (ref 39.0–52.0)
Hemoglobin: 10.2 g/dL — ABNORMAL LOW (ref 13.0–17.0)
MCH: 31.7 pg (ref 26.0–34.0)
MCHC: 33.1 g/dL (ref 30.0–36.0)
MCV: 95.7 fL (ref 78.0–100.0)
PLATELETS: 407 10*3/uL — AB (ref 150–400)
RBC: 3.22 MIL/uL — ABNORMAL LOW (ref 4.22–5.81)
RDW: 13.1 % (ref 11.5–15.5)
WBC: 9.8 10*3/uL (ref 4.0–10.5)

## 2016-01-07 LAB — BASIC METABOLIC PANEL
ANION GAP: 14 (ref 5–15)
BUN: 10 mg/dL (ref 6–20)
CALCIUM: 7.8 mg/dL — AB (ref 8.9–10.3)
CO2: 19 mmol/L — ABNORMAL LOW (ref 22–32)
CREATININE: 0.97 mg/dL (ref 0.61–1.24)
Chloride: 105 mmol/L (ref 101–111)
GLUCOSE: 81 mg/dL (ref 65–99)
Potassium: 3.5 mmol/L (ref 3.5–5.1)
Sodium: 138 mmol/L (ref 135–145)

## 2016-01-07 NOTE — Care Management Important Message (Signed)
Important Message  Patient Details  Name: Daniel Yu MRN: [id number] Date of Birth: [birth year]   Medicare Important Message Given:  Yes    Marilu Favre, RN 01/07/2016, 2:38 PM

## 2016-01-07 NOTE — Progress Notes (Signed)
Physical Therapy Treatment Patient Details Name: Daniel Yu MRN: [id number] DOB: 08-Apr-1932 Today's Date: 01/07/2016    History of Present Illness Pt is an 82 y.o. male admitted with SBO vs colonic obstruction vs ileus s/p TKA on 12/27/15. PMH: hypertension, asthma.     PT Comments    Pt known to me from previous adm for TKA at [facility]; he and his wife are amb multiple times per day as well as performing TKA HEP I'ly; reviewed HEP briefly emphasizing importance of ext/flexion stretches; encourged pt and wife to continue  amb/ex; pt may benefit from OPPT at D/C as he is making excellent progress thus far, discussed with pt/wife; will check back on pt on Monday if he is still here;  Follow Up Recommendations  Home health PT;Outpatient PT (vs--pt is making excellent progress at this time)     Equipment Recommendations  None recommended by PT    Recommendations for Other Services       Precautions / Restrictions Precautions Precautions: Knee;Fall Restrictions Weight Bearing Restrictions: No Other Position/Activity Restrictions: WBAT    Mobility  Bed Mobility Overal bed mobility: Modified Independent Bed Mobility: Sit to Supine       Sit to supine: Modified independent (Device/Increase time)      Transfers Overall transfer level: Needs assistance Equipment used: Rolling walker (2 wheeled) Transfers: Sit to/from Stand Sit to Stand: Modified independent (Device/Increase time)            Ambulation/Gait Ambulation/Gait assistance: Supervision;Modified independent (Device/Increase time) Ambulation Distance (Feet):  (40' with therapy--pt amb >200' with RW mod I--wife with IV) Assistive device: Rolling walker (2 wheeled) Gait Pattern/deviations: Step-through pattern     General Gait Details: cues for posture   Stairs            Wheelchair Mobility    Modified Rankin (Stroke Patients Only)       Balance                                     Cognition Arousal/Alertness: Awake/alert Behavior During Therapy: WFL for tasks assessed/performed Overall Cognitive Status: Within Functional Limits for tasks assessed                      Exercises Total Joint Exercises Ankle Circles/Pumps: AROM;Both;10 reps;Supine Quad Sets: Strengthening;AROM;Right;5 reps Heel Slides: AROM;Right;5 reps Knee Flexion: AROM;AAROM;Right;5 reps Goniometric ROM: ~ 8* to 100* right knee flexion in sitting    General Comments        Pertinent Vitals/Pain Pain Assessment: No/denies pain    Home Living                      Prior Function            PT Goals (current goals can now be found in the care plan section) Acute Rehab PT Goals Patient Stated Goal: get back on track with his knee PT Goal Formulation: With patient/family Time For Goal Achievement: 01/18/16 Potential to Achieve Goals: Good Progress towards PT goals: Progressing toward goals    Frequency  Min 3X/week    PT Plan Current plan remains appropriate;Frequency needs to be updated    Co-evaluation             End of Session   Activity Tolerance: Patient tolerated treatment well Patient left: in bed;with call bell/phone within reach;with family/visitor present     Time:  [id number] PT Time Calculation (min) (ACUTE ONLY): 22 min  Charges:  $Therapeutic Activity: 8-22 mins                    G Codes:      Daniel Yu February 01, 2016, 1:56 PM

## 2016-01-07 NOTE — Progress Notes (Signed)
Orthopedic Tech Progress Note Patient Details:  Daniel Yu 1931-12-07 [id number]  Patient ID: Daniel Yu, male   DOB: Dec 31, 1931, 82 y.o.   MRN: [id number] Placed pt's rle on cpm @0 -100 degrees @1245   Hildred Priest 01/07/2016, 12:52 PM

## 2016-01-07 NOTE — Progress Notes (Signed)
[facility] Progress Note    Subjective: Ileus has not completely resolved, although it seems to be better.  No tenderness.  Objective: Vital signs in last 24 hours: Temp:  [98 F (36.7 C)-98.1 F (36.7 C)] 98.1 F (36.7 C) (05/19 0543) Pulse Rate:  [100-147] 100 (05/19 0543) BP: (118-125)/(69-70) 125/70 mmHg (05/19 0543) SpO2:  [97 %] 97 % (05/19 0543) Weight:  [104.509 kg (230 lb 6.4 oz)] 104.509 kg (230 lb 6.4 oz) (05/19 0543) Last BM Date: 01/06/16  Intake/Output from previous day: 05/18 0701 - 05/19 0700 In: 590 [P.O.:120; I.V.:440; NG/GT:30] Out: 1400 [Urine:400; Emesis/NG output:1000] Intake/Output this shift:    General: No acute distress.  Anxious to get out of the hospital  Lungs: clear  Abd: Softer than previously, some bowel sounds.  No tenderness.  NGT has been clamped for about a day.  Some gase yesterday.  X-rays today still shows some stacking of SB loops, minimal colonic gas.  Extremities: No changes.  CPM machine in room and the patient has used this.  Neuro: Intact  Lab Results:  @LABLAST2 (wbc:2,hgb:2,hct:2,plt:2) BMET ) Recent Labs  01/06/16 0642 01/07/16 0509  NA 135 138  K 3.5 3.5  CL 102 105  CO2 19* 19*  GLUCOSE 96 81  BUN 13 10  CREATININE 0.84 0.97  CALCIUM 7.7* 7.8*   PT/INR No results for input(s): LABPROT, INR in the last 72 hours. ABG No results for input(s): PHART, HCO3 in the last 72 hours.  Invalid input(s): PCO2, PO2  Studies/Results: Dg Abd 1 View  01/07/2016  CLINICAL DATA:  82 year old male with postoperative ileus following knee replacement EXAM: ABDOMEN - 1 VIEW COMPARISON:  Prior abdominal radiographs 01/06/2016 FINDINGS: Persistent gaseous distention of numerous loops of small bowel throughout the abdomen. Maximal small bowel diameter is 4.9 cm which is similar compared to prior imaging. No large free air on this single supine view. Multilevel degenerative disc disease and lower lumbar facet arthropathy. IMPRESSION:  Persistent ileus pattern versus partial small bowel obstruction. Maximal small bowel diameter is 4.9 cm. Electronically Signed   By: Jacqulynn Cadet M.D.   On: 01/07/2016 07:51   Dg Abd 2 Views  01/06/2016  CLINICAL DATA:  Postoperative day 7 status post total knee arthroplasty complicated by postoperative ileus. EXAM: ABDOMEN - 2 VIEW COMPARISON:  01/04/2016 abdominal radiograph. FINDINGS: Enteric tube terminates in the proximal stomach. Diffuse mild to moderate gaseous distention of the small and large bowel with air-fluid levels throughout the small and large bowel, with the degree of dilatation not appreciably changed. No evidence of pneumatosis or pneumoperitoneum. Vascular calcifications in the pelvis. Trace right pleural effusion. Moderate degenerative changes in the visualized thoracolumbar spine. IMPRESSION: Diffuse mild to moderate gaseous distention of the small and large bowel with air-fluid levels, consistent with adynamic ileus, with no appreciable radiographic improvement. Electronically Signed   By: Ilona Sorrel M.D.   On: 01/06/2016 09:52    Anti-infectives: Anti-infectives    None      Assessment/Plan: s/p  In spite of the X-rays I think the patientis improving  Clamp NGT and let the patient have sips of liquids.  LOS: 4 days   Kathryne Eriksson. Dahlia Bailiff, MD, FACS [phone] [facility] 01/07/2016

## 2016-01-07 NOTE — Progress Notes (Signed)
[facility]                                                                              Patient Demographics  Daniel Yu, is a 82 y.o. male, DOB - 1932-03-21, [id number]  Admit date - 01/03/2016   Admitting Physician Waldemar Dickens, MD  Outpatient Primary MD for the patient is Irven Shelling, MD  Outpatient specialists:   LOS - 4  days    Chief Complaint  Patient presents with  . Emesis       Brief summary   82 y/o white male with PMH OA s/p recent TKA on 12/27/15, prior left inguinal hernia repair. During the hospitalization patient did well and was discharged home w/ PT, right knee brace and Xarelto for DVT prophylaxis. Within 24 hours after discharging home patient began developing progressive abdominal bloating associated with N/V, abd discomfort. Abdominal imaging consistent with small bowel obstruction. Surgery consulted.   Assessment & Plan   Principal Problem:  SBO (small bowel obstruction) (HCC) -  continue with conservative management for now, On NGT suction, gentle hydration - Gen. surgery following - Abdominal x-ray this a.m. showed persistent ileus pattern versus SBO. Clinically improving. Surgery recommended clamping NGT today  Active Problems:  Hiccups - suspect related to the above and significant gaseous distension -Improved with Thorazine as needed   Acute blood loss anemia - with recent surgery [name] and now some dilutional component - H&H stable.    Dehydration with hyponatremia -Continue gentle hydration   S/P right knee surgery/TKA  - PT eval requested    Acute hypokalemia - resolved    Leukopenia - resolved    Code Status: Full CODE STATUS  DVT Prophylaxis:  Heparin subcutaneous   Family Communication: Discussed in detail with the patient, all imaging results, lab results explained to the patient and wife   Disposition Plan: when cleared by surgery and tolerating diet  Time Spent in minutes    25 minutes  Procedures:  None  Consultants:   Gen. surgery  Antimicrobials:   None   Medications  Scheduled Meds: . heparin  5,000 Units Subcutaneous Q8H  . metoCLOPramide (REGLAN) injection  5 mg Intravenous Q6H  . pantoprazole (PROTONIX) IV  40 mg Intravenous Q12H   Continuous Infusions: . 0.9 % NaCl with KCl 20 mEq / L 75 mL/hr at 01/07/16 1058   PRN Meds:.acetaminophen **OR** acetaminophen, chlorproMAZINE (THORAZINE) injection, hydrALAZINE, ondansetron **OR** ondansetron (ZOFRAN) IV   Antibiotics   Anti-infectives    None        Subjective:   Daniel Yu was seen and examined today. Feeling slightly better today, had a small BM. Hiccups improved. Patient denies dizziness, chest pain, shortness of breath, new weakness, numbess, tingling. No acute events overnight.    Objective:   Filed Vitals:   01/06/16 0550 01/06/16 2154 01/06/16 2200 01/07/16 0543  BP: 143/81 118/69  125/70  Pulse: 89 147 100 100  Temp: 98.9 F (37.2 C) 98 F (36.7 C)  98.1 F (36.7 C)  TempSrc:  Oral  Oral  Resp: 19     Weight: 92.565 kg (204 lb 1.1 oz)  104.509 kg (230 lb 6.4 oz)  SpO2: 96% 97%  97%    Intake/Output Summary (Last 24 hours) at 01/07/16 1400 Last data filed at 01/07/16 1200  Gross per 24 hour  Intake    600 ml  Output   1301 ml  Net   -701 ml     Wt Readings from Last 3 Encounters:  01/07/16 104.509 kg (230 lb 6.4 oz)  12/27/15 90.719 kg (200 lb)  12/15/15 90.719 kg (200 lb)     Exam  General: Alert and oriented x 3, NAD  HEENT:  PERRLA, EOMI, Anicteric Sclera, mucous membranes moist.   Neck: Supple, no JVD, no masses  Cardiovascular: S1 S2 auscultated, no rubs, murmurs or gallops. Regular rate and rhythm.  Respiratory: Clear to auscultation bilaterally, no wheezing, rales or rhonchi  Gastrointestinal: Soft, nontender, distended, hypoactive  bowel sounds  Ext: no cyanosis clubbing or edema  Neuro: AAOx3, Cr N's II- XII. Strength 5/5 upper  and lower extremities bilaterally  Skin: No rashes  Psych: Normal affect and demeanor, alert and oriented x3    Data Reviewed:  I have personally reviewed following labs and imaging studies  Micro Results No results found for this or any previous visit (from the past 240 hour(s)).  Radiology Reports Dg Abd 1 View  01/07/2016  CLINICAL DATA:  82 year old male with postoperative ileus following knee replacement EXAM: ABDOMEN - 1 VIEW COMPARISON:  Prior abdominal radiographs 01/06/2016 FINDINGS: Persistent gaseous distention of numerous loops of small bowel throughout the abdomen. Maximal small bowel diameter is 4.9 cm which is similar compared to prior imaging. No large free air on this single supine view. Multilevel degenerative disc disease and lower lumbar facet arthropathy. IMPRESSION: Persistent ileus pattern versus partial small bowel obstruction. Maximal small bowel diameter is 4.9 cm. Electronically Signed   By: Jacqulynn Cadet M.D.   On: 01/07/2016 07:51   Dg Abd 1 View  01/04/2016  CLINICAL DATA:  Postoperative ileus.  Abdominal pain and distention. EXAM: ABDOMEN - 1 VIEW COMPARISON:  01/04/2016 abdominal radiograph. FINDINGS: There is persistent diffuse gaseous distention of the small and large bowel, not appreciably changed. No evidence of pneumatosis or pneumoperitoneum. No pathologic soft tissue calcifications. Marked degenerative changes throughout the visualized thoracolumbar spine. IMPRESSION: No appreciable change in diffuse gaseous distention of the small and large bowel, most consistent with adynamic ileus. Electronically Signed   By: Ilona Sorrel M.D.   On: 01/04/2016 08:29   Ct Abdomen Pelvis W Contrast  01/03/2016  CLINICAL DATA:  Recent knee surgery, abdominal pain/vomiting, evaluate for obstruction EXAM: CT ABDOMEN AND PELVIS WITH CONTRAST TECHNIQUE: Multidetector CT imaging of the abdomen and pelvis was performed using the standard protocol following bolus administration  of intravenous contrast. CONTRAST:  152mL ISOVUE-300 IOPAMIDOL (ISOVUE-300) INJECTION 61% COMPARISON:  None. FINDINGS: Lower chest: Mild patchy bilateral lower lobe opacities, likely atelectasis. Hepatobiliary: Liver is within normal limits. No suspicious/enhancing hepatic lesions. Gallbladder is unremarkable. No intrahepatic or extrahepatic ductal dilatation. Pancreas: Within normal limits. Spleen: Within normal limits. Adrenals/Urinary Tract: Adrenal glands are within normal limits. Kidneys are within normal limits.  No hydronephrosis. Bladder is within normal limits. Stomach/Bowel: Stomach is notable for a tiny hiatal hernia. Multiple dilated loops of small bowel. Right colon is also dilated, including the ascending transverse, and descending colon. Sigmoid colon and rectum are mildly decompressed. If a transition point is present, it is in the left lower quadrant/ inguinal region (series 2/image 87), at the site of a prior inguinal hernia  repair, although no definite hernia is present. Vascular/Lymphatic: Atherosclerotic calcifications of the abdominal aorta and branch vessels. No evidence of abdominal aortic aneurysm. No suspicious abdominopelvic lymphadenopathy. Reproductive: Prostate is grossly unremarkable. Other: Small volume perihepatic ascites.  Trace left pelvic ascites. Postsurgical changes related to prior bilateral inguinal hernia repairs. Musculoskeletal: Moderate degenerative changes of the visualized thoracolumbar spine. IMPRESSION: Dilated loops of small and large bowel. Relative decompression of the sigmoid colon and rectum. Overall, in the setting of recent surgery, adynamic postoperative ileus is suspected. Colonic obstruction with relative transition in the left inguinal region is considered less likely given the lack of a true left inguinal hernia on CT. Status post bilateral inguinal hernia repair. Electronically Signed   By: Julian Hy M.D.   On: 01/03/2016 08:51   Dg Abd 2  Views  01/06/2016  CLINICAL DATA:  Postoperative day 7 status post total knee arthroplasty complicated by postoperative ileus. EXAM: ABDOMEN - 2 VIEW COMPARISON:  01/04/2016 abdominal radiograph. FINDINGS: Enteric tube terminates in the proximal stomach. Diffuse mild to moderate gaseous distention of the small and large bowel with air-fluid levels throughout the small and large bowel, with the degree of dilatation not appreciably changed. No evidence of pneumatosis or pneumoperitoneum. Vascular calcifications in the pelvis. Trace right pleural effusion. Moderate degenerative changes in the visualized thoracolumbar spine. IMPRESSION: Diffuse mild to moderate gaseous distention of the small and large bowel with air-fluid levels, consistent with adynamic ileus, with no appreciable radiographic improvement. Electronically Signed   By: Ilona Sorrel M.D.   On: 01/06/2016 09:52   Dg Abd Acute W/chest  01/03/2016  CLINICAL DATA:  Vomiting. Right knee replacement 1 week ago. Nausea for 3 days, vomiting for 1 day, abdominal distention for 2 days. EXAM: DG ABDOMEN ACUTE W/ 1V CHEST COMPARISON:  None. FINDINGS: Low lung volumes with linear atelectasis or fibrosis in the lung bases. Probable emphysematous changes in the lungs. Normal heart size and pulmonary vascularity. No focal consolidation or airspace disease. Calcified and tortuous aorta. Diffuse gaseous distention of small bowel with multiple air-fluid levels. There is some prominence of gas-filled colon as well. Although these changes could represent ileus, the degree of small bowel dilatation is more than would be expected for ileus and obstruction is suggested. No free intra-abdominal air. No radiopaque stones. Degenerative changes in the spine and hips. Vascular calcifications. IMPRESSION: No evidence of active pulmonary disease. Prominent gas distended small bowel with air-fluid levels likely represent small bowel obstruction. There is some gas distention of colon  is well. Level of obstruction is not indicated. Electronically Signed   By: Lucienne Capers M.D.   On: 01/03/2016 06:46   Dg Abd Portable 1v-small Bowel Obstruction Protocol-initial, 8 Hr Delay  01/04/2016  CLINICAL DATA:  Small bowel obstruction.  8 hour delayed. EXAM: PORTABLE ABDOMEN - 1 VIEW COMPARISON:  01/03/2016 FINDINGS: Prominent gaseous distention of small and large bowel most likely to represent adynamic ileus. Enteric tube is not visualized but may be off the field of view. Degenerative changes in the lumbar spine and hips. Residual contrast material in the bladder. IMPRESSION: Diffuse gaseous distention of small and large bowel most likely to represent ileus. Electronically Signed   By: Lucienne Capers M.D.   On: 01/04/2016 02:18   Dg Abd Portable 1v-small Bowel Protocol-position Verification  01/03/2016  CLINICAL DATA:  Followup small bowel obstruction. Nasogastric tube placement. EXAM: PORTABLE ABDOMEN - 1 VIEW 2:24 p.m.: COMPARISON:  CT abdomen and pelvis and acute abdomen series performed earlier today.  FINDINGS: Nasogastric tube tip in the fundus of the stomach. Persistent marked dilation of multiple loops of small bowel throughout the abdomen and pelvis. Contrast from the CT earlier today within the normal-appearing urinary tract. IMPRESSION: Nasogastric tube tip in the fundus of the stomach. Persistent small bowel obstruction. Electronically Signed   By: Evangeline Dakin M.D.   On: 01/03/2016 14:36    Lab Data:  CBC:  Recent Labs Lab 01/03/16 0617 01/04/16 0657 01/05/16 0747 01/06/16 0642 01/07/16 0509  WBC 4.2 3.6* 5.5 8.7 9.8  NEUTROABS 2.9  --   --   --   --   HGB 11.5* 10.9* 9.9* 10.4* 10.2*  HCT 32.1* 32.0* 29.5* 31.1* 30.8*  MCV 92.5 93.6 96.1 95.4 95.7  PLT 443* 390 441* 436* [id number]*   Basic Metabolic Panel:  Recent Labs Lab 01/03/16 1808 01/04/16 0657 01/05/16 0747 01/06/16 0642 01/07/16 0509  NA 126* 132* 137 135 138  K 3.6 3.6 3.6 3.5 3.5  CL 92* 97*  99* 102 105  CO2 25 21* 23 19* 19*  GLUCOSE 119* 132* 80 96 81  BUN 20 24* 19 13 10   CREATININE 0.79 1.00 0.92 0.84 0.97  CALCIUM 8.0* 8.1* 7.9* 7.7* 7.8*   GFR: Estimated Creatinine Clearance: 73.5 mL/min (by C-G formula based on Cr of 0.97). Liver Function Tests:  Recent Labs Lab 01/03/16 0617 01/04/16 0657  AST 20 23  ALT 17 19  ALKPHOS 47 47  BILITOT 1.2 1.1  PROT 6.1* 5.6*  ALBUMIN 3.1* 2.8*    Recent Labs Lab 01/03/16 0617  LIPASE 15   No results for input(s): AMMONIA in the last 168 hours. Coagulation Profile: No results for input(s): INR, PROTIME in the last 168 hours. Cardiac Enzymes: No results for input(s): CKTOTAL, CKMB, CKMBINDEX, TROPONINI in the last 168 hours. BNP (last 3 results) No results for input(s): PROBNP in the last 8760 hours. HbA1C: No results for input(s): HGBA1C in the last 72 hours. CBG: No results for input(s): GLUCAP in the last 168 hours. Lipid Profile: No results for input(s): CHOL, HDL, LDLCALC, TRIG, CHOLHDL, LDLDIRECT in the last 72 hours. Thyroid Function Tests: No results for input(s): TSH, T4TOTAL, FREET4, T3FREE, THYROIDAB in the last 72 hours. Anemia Panel: No results for input(s): VITAMINB12, FOLATE, FERRITIN, TIBC, IRON, RETICCTPCT in the last 72 hours. Urine analysis:    Component Value Date/Time   COLORURINE AMBER* 01/04/2016 0705   APPEARANCEUR CLEAR 01/04/2016 0705   LABSPEC 1.043* 01/04/2016 0705   PHURINE 6.0 01/04/2016 0705   GLUCOSEU NEGATIVE 01/04/2016 0705   HGBUR NEGATIVE 01/04/2016 0705   BILIRUBINUR SMALL* 01/04/2016 0705   KETONESUR 15* 01/04/2016 0705   PROTEINUR NEGATIVE 01/04/2016 0705   NITRITE NEGATIVE 01/04/2016 0705   LEUKOCYTESUR NEGATIVE 01/04/2016 0705     RAI,RIPUDEEP M.D. [facility] 01/07/2016, 2:00 PM  Pager: [phone] Between 7am to 7pm - call Pager - [phone]  After 7pm go to [email or web address] - password [email or web address]  Call night coverage person covering after 7pm

## 2016-01-08 LAB — BASIC METABOLIC PANEL
ANION GAP: 11 (ref 5–15)
BUN: 7 mg/dL (ref 6–20)
CALCIUM: 7.9 mg/dL — AB (ref 8.9–10.3)
CO2: 21 mmol/L — ABNORMAL LOW (ref 22–32)
Chloride: 105 mmol/L (ref 101–111)
Creatinine, Ser: 0.79 mg/dL (ref 0.61–1.24)
GFR calc Af Amer: >60 mL/min (ref >60)
GLUCOSE: 130 mg/dL — AB (ref 65–99)
Potassium: 3.4 mmol/L — ABNORMAL LOW (ref 3.5–5.1)
Sodium: 137 mmol/L (ref 135–145)

## 2016-01-08 LAB — CBC
HCT: 28.2 % — ABNORMAL LOW (ref 39.0–52.0)
HEMOGLOBIN: 9.4 g/dL — AB (ref 13.0–17.0)
MCH: 31.5 pg (ref 26.0–34.0)
MCHC: 33.3 g/dL (ref 30.0–36.0)
MCV: 94.6 fL (ref 78.0–100.0)
Platelets: 417 10*3/uL — ABNORMAL HIGH (ref 150–400)
RBC: 2.98 MIL/uL — ABNORMAL LOW (ref 4.22–5.81)
RDW: 13.3 % (ref 11.5–15.5)
WBC: 12.2 10*3/uL — ABNORMAL HIGH (ref 4.0–10.5)

## 2016-01-08 MED ORDER — METOCLOPRAMIDE HCL 5 MG/ML IJ SOLN
10.0000 mg | Freq: Four times a day (QID) | INTRAMUSCULAR | Status: AC
  Administered 2016-01-08 – 2016-01-09 (×4): 10 mg via INTRAVENOUS
  Filled 2016-01-08 (×4): qty 2

## 2016-01-08 MED ORDER — CHLORPROMAZINE HCL 10 MG PO TABS
10.0000 mg | ORAL_TABLET | Freq: Three times a day (TID) | ORAL | Status: DC | PRN
  Administered 2016-01-08 (×2): 25 mg via ORAL
  Filled 2016-01-08 (×3): qty 3

## 2016-01-08 NOTE — Progress Notes (Signed)
[facility] Progress Note  Patient: Daniel Yu [id number]   PCP: Irven Shelling, MD DOB: [birth year]   DOA: 01/03/2016   DOS: 01/08/2016   Date of Service: the patient was seen and examined on 01/08/2016  Subjective: Patient has 2 bowel movements yesterday. Denies any nausea or vomiting. Tolerating clear liquid diet. Nutrition: Tolerating clear liquid diet  Brief hospital course: 82 y/o white male with PMH OA s/p recent TKA on 12/27/15, prior left inguinal hernia repair. During the hospitalization patient did well and was discharged home w/ PT, right knee brace and Xarelto for DVT prophylaxis. Within 24 hours after discharging home patient began developing progressive abdominal bloating associated with N/V, abd discomfort. Abdominal imaging consistent with small bowel obstruction. Surgery consulted.  Assessment and Plan: 1. SBO (small bowel obstruction) (HCC) Versus ileus. X-ray shows persistent signs of ileus. Patient has been on Reglan 5 mg. Patient has been having bowel movement although surgery recommends to reinsert NG tube due to increased WBC. We'll monitor the patient clinically. Currently I will increase Reglan from 5-10 mg. Monitor.  2. Hiccups. Continue Thorazine as needed.  3. Anemia. Recent right TKA. Continue monitoring H&H.  4. Dehydration. Continue gentle hydration. Replace potassium.  5. Recent right TKA. Continue PT in the hospital.  Pain management: When necessary Tylenol Activity: Continue physical therapy Bowel regimen: last BM 01/08/2016 Diet: Nothing by mouth DVT Prophylaxis: subcutaneous Heparin  Advance goals of care discussion: full code  Family Communication: family was present at bedside, at the time of interview. The pt provided permission to discuss medical plan with the family. Opportunity was given to ask question and all questions were answered satisfactorily.   Disposition:  Discharge to home, with home health Expected  discharge date: 01/11/2016  Consultants: Gen. surgery Procedures: None  Antibiotics: Anti-infectives    None        Intake/Output Summary (Last 24 hours) at 01/08/16 1823 Last data filed at 01/08/16 1400  Gross per 24 hour  Intake   1785 ml  Output      0 ml  Net   1785 ml   Filed Weights   01/06/16 0550 01/07/16 0543 01/08/16 0539  Weight: 92.565 kg (204 lb 1.1 oz) 104.509 kg (230 lb 6.4 oz) 105.96 kg (233 lb 9.6 oz)    Objective: Physical Exam: Filed Vitals:   01/07/16 1407 01/07/16 2148 01/08/16 0539 01/08/16 1523  BP: 122/82 132/75 143/81 134/75  Pulse: 95 104 95 73  Temp: 98.1 F (36.7 C) 97.5 F (36.4 C) 99 F (37.2 C) 97.5 F (36.4 C)  TempSrc: Oral Oral Oral Oral  Resp: 20 19 18 18   Weight:   105.96 kg (233 lb 9.6 oz)   SpO2: 98% 96% 97% 99%    General: Alert, Awake and Oriented to Time, Place and Person. Appear in mild distress Eyes: PERRL, Conjunctiva normal ENT: Oral Mucosa clear moist. Neck: no JVD, no Abnormal Mass Or lumps Cardiovascular: S1 and S2 Present, no Murmur, Peripheral Pulses Present Respiratory: Bilateral Air entry equal and Decreased, Clear to Auscultation, no Crackles, no wheezes Abdomen: Bowel Sound present, Soft and no tenderness Skin: no redness, no Rash  Extremities: no Pedal edema, no calf tenderness Neurologic: Grossly no focal neuro deficit. Bilaterally Equal motor strength  Data Reviewed: CBC:  Recent Labs Lab 01/03/16 0617 01/04/16 0657 01/05/16 0747 01/06/16 0642 01/07/16 0509 01/08/16 0424  WBC 4.2 3.6* 5.5 8.7 9.8 12.2*  NEUTROABS 2.9  --   --   --   --   --  HGB 11.5* 10.9* 9.9* 10.4* 10.2* 9.4*  HCT 32.1* 32.0* 29.5* 31.1* 30.8* 28.2*  MCV 92.5 93.6 96.1 95.4 95.7 94.6  PLT 443* 390 441* 436* 407* [id number]*   Basic Metabolic Panel:  Recent Labs Lab 01/04/16 0657 01/05/16 0747 01/06/16 0642 01/07/16 0509 01/08/16 0424  NA 132* 137 135 138 137  K 3.6 3.6 3.5 3.5 3.4*  CL 97* 99* 102 105 105  CO2 21*  23 19* 19* 21*  GLUCOSE 132* 80 96 81 130*  BUN 24* 19 13 10 7   CREATININE 1.00 0.92 0.84 0.97 0.79  CALCIUM 8.1* 7.9* 7.7* 7.8* 7.9*    Liver Function Tests:  Recent Labs Lab 01/03/16 0617 01/04/16 0657  AST 20 23  ALT 17 19  ALKPHOS 47 47  BILITOT 1.2 1.1  PROT 6.1* 5.6*  ALBUMIN 3.1* 2.8*    Recent Labs Lab 01/03/16 0617  LIPASE 15   No results for input(s): AMMONIA in the last 168 hours. Coagulation Profile: No results for input(s): INR, PROTIME in the last 168 hours. Cardiac Enzymes: No results for input(s): CKTOTAL, CKMB, CKMBINDEX, TROPONINI in the last 168 hours. BNP (last 3 results) No results for input(s): PROBNP in the last 8760 hours.  CBG: No results for input(s): GLUCAP in the last 168 hours.  Studies: No results found.   Scheduled Meds: . heparin  5,000 Units Subcutaneous Q8H  . metoCLOPramide (REGLAN) injection  10 mg Intravenous Q6H  . pantoprazole (PROTONIX) IV  40 mg Intravenous Q12H   Continuous Infusions: . 0.9 % NaCl with KCl 20 mEq / L 75 mL/hr at 01/07/16 2346   PRN Meds: acetaminophen **OR** acetaminophen, chlorproMAZINE (THORAZINE) injection, chlorproMAZINE, hydrALAZINE, ondansetron **OR** ondansetron (ZOFRAN) IV  Time spent: 30 minutes  Author: Berle Mull, MD [facility] Pager: [phone] 01/08/2016 6:23 PM  If 7PM-7AM, please contact night-coverage at [email or web address], password [email or web address]

## 2016-01-08 NOTE — Progress Notes (Signed)
Patient refused CPM right now. Also refused to ambulate in hall. States he will do it later tonight.

## 2016-01-08 NOTE — Progress Notes (Signed)
Patient called this RN to inform that he will forgo the CPM tonight since patient wants to sleep early but instead wants the CPM around 0500, the usual time the Ortho Tech will do the CPM.  Paged Ortho Tech but still got no reply.

## 2016-01-08 NOTE — Progress Notes (Signed)
  Subjective: No complaints. Feels good. Passing flatus  Objective: Vital signs in last 24 hours: Temp:  [97.5 F (36.4 C)-99 F (37.2 C)] 99 F (37.2 C) (05/20 0539) Pulse Rate:  [95-104] 95 (05/20 0539) Resp:  [18-20] 18 (05/20 0539) BP: (122-143)/(75-82) 143/81 mmHg (05/20 0539) SpO2:  [96 %-98 %] 97 % (05/20 0539) Weight:  [105.96 kg (233 lb 9.6 oz)] 105.96 kg (233 lb 9.6 oz) (05/20 0539) Last BM Date: 01/06/16  Intake/Output from previous day: 05/19 0701 - 05/20 0700 In: 2040 [P.O.:360; I.V.:1650; NG/GT:30] Out: 2 [Stool:2] Intake/Output this shift:    Resp: clear to auscultation bilaterally Cardio: regular rate and rhythm GI: soft, distended. nontender  Lab Results:   Recent Labs  01/07/16 0509 01/08/16 0424  WBC 9.8 12.2*  HGB 10.2* 9.4*  HCT 30.8* 28.2*  PLT 407* 417*   BMET  Recent Labs  01/07/16 0509 01/08/16 0424  NA 138 137  K 3.5 3.4*  CL 105 105  CO2 19* 21*  GLUCOSE 81 130*  BUN 10 7  CREATININE 0.97 0.79  CALCIUM 7.8* 7.9*   PT/INR No results for input(s): LABPROT, INR in the last 72 hours. ABG No results for input(s): PHART, HCO3 in the last 72 hours.  Invalid input(s): PCO2, PO2  Studies/Results: Dg Abd 1 View  01/07/2016  CLINICAL DATA:  82 year old male with postoperative ileus following knee replacement EXAM: ABDOMEN - 1 VIEW COMPARISON:  Prior abdominal radiographs 01/06/2016 FINDINGS: Persistent gaseous distention of numerous loops of small bowel throughout the abdomen. Maximal small bowel diameter is 4.9 cm which is similar compared to prior imaging. No large free air on this single supine view. Multilevel degenerative disc disease and lower lumbar facet arthropathy. IMPRESSION: Persistent ileus pattern versus partial small bowel obstruction. Maximal small bowel diameter is 4.9 cm. Electronically Signed   By: Jacqulynn Cadet M.D.   On: 01/07/2016 07:51    Anti-infectives: Anti-infectives    None       Assessment/Plan: s/p * No surgery found * xrays unchanged. given rise in wbc i will place ng back to suction  Continue reglan for ileus cpm for recent knee surgery  LOS: 5 days    TOTH III,PAUL S 01/08/2016

## 2016-01-08 NOTE — Progress Notes (Signed)
Dr. Kieth Brightly notified NG tube was taken out.

## 2016-01-09 LAB — CBC WITH DIFFERENTIAL/PLATELET
BASOS ABS: 0 10*3/uL (ref 0.0–0.1)
BASOS PCT: 0 %
EOS ABS: 0.3 10*3/uL (ref 0.0–0.7)
EOS PCT: 3 %
HCT: 28.4 % — ABNORMAL LOW (ref 39.0–52.0)
Hemoglobin: 9.5 g/dL — ABNORMAL LOW (ref 13.0–17.0)
LYMPHS ABS: 0.9 10*3/uL (ref 0.7–4.0)
Lymphocytes Relative: 9 %
MCH: 31.6 pg (ref 26.0–34.0)
MCHC: 33.5 g/dL (ref 30.0–36.0)
MCV: 94.4 fL (ref 78.0–100.0)
Monocytes Absolute: 0.7 10*3/uL (ref 0.1–1.0)
Monocytes Relative: 7 %
Neutro Abs: 7.6 10*3/uL (ref 1.7–7.7)
Neutrophils Relative %: 81 %
PLATELETS: 379 10*3/uL (ref 150–400)
RBC: 3.01 MIL/uL — AB (ref 4.22–5.81)
RDW: 13.4 % (ref 11.5–15.5)
WBC: 9.5 10*3/uL (ref 4.0–10.5)

## 2016-01-09 LAB — COMPREHENSIVE METABOLIC PANEL
ALBUMIN: 2.1 g/dL — AB (ref 3.5–5.0)
ALT: 19 U/L (ref 17–63)
AST: 17 U/L (ref 15–41)
Alkaline Phosphatase: 46 U/L (ref 38–126)
Anion gap: 9 (ref 5–15)
BUN: 6 mg/dL (ref 6–20)
CHLORIDE: 106 mmol/L (ref 101–111)
CO2: 21 mmol/L — AB (ref 22–32)
CREATININE: 0.76 mg/dL (ref 0.61–1.24)
Calcium: 7.8 mg/dL — ABNORMAL LOW (ref 8.9–10.3)
GFR calc non Af Amer: >60 mL/min (ref >60)
GLUCOSE: 108 mg/dL — AB (ref 65–99)
Potassium: 3.7 mmol/L (ref 3.5–5.1)
SODIUM: 136 mmol/L (ref 135–145)
Total Bilirubin: 0.8 mg/dL (ref 0.3–1.2)
Total Protein: 4.9 g/dL — ABNORMAL LOW (ref 6.5–8.1)

## 2016-01-09 LAB — MAGNESIUM: Magnesium: 2 mg/dL (ref 1.7–2.4)

## 2016-01-09 MED ORDER — METOCLOPRAMIDE HCL 5 MG/ML IJ SOLN
10.0000 mg | Freq: Four times a day (QID) | INTRAMUSCULAR | Status: DC
  Administered 2016-01-09 – 2016-01-10 (×3): 10 mg via INTRAVENOUS
  Filled 2016-01-09 (×3): qty 2

## 2016-01-09 NOTE — Progress Notes (Signed)
Orthopedic Tech Progress Note Patient Details:  [facility] 1931/10/01 [id number]  CPM Right Knee CPM Right Knee: Off Right Knee Flexion (Degrees): 90 Right Knee Extension (Degrees): 0   [place] Pink 01/09/2016, 11:26 AMPut pt. In CPM at 11am.

## 2016-01-09 NOTE — Progress Notes (Signed)
Temp 99.5. IS given with teach back. Patient reached 1750 on IS

## 2016-01-09 NOTE — Progress Notes (Signed)
[facility] Progress Note  Patient: Daniel Yu [id number]   PCP: Irven Shelling, MD DOB: 07/10/1932   DOA: 01/03/2016   DOS: 01/09/2016   Date of Service: the patient was seen and examined on 01/09/2016  Subjective: Continues to pass gas and also have regular daily bowel movement. No nausea or vomiting. No chest pain. Abdominal pain is mild. Nutrition: Tolerating clear liquid diet  Brief hospital course: 82 y/o white male with PMH OA s/p recent TKA on 12/27/15, prior left inguinal hernia repair. During the hospitalization patient did well and was discharged home w/ PT, right knee brace and Xarelto for DVT prophylaxis. Within 24 hours after discharging home patient began developing progressive abdominal bloating associated with N/V, abd discomfort. Abdominal imaging consistent with small bowel obstruction. Surgery consulted.  Assessment and Plan: 1. SBO (small bowel obstruction) (HCC) Versus ileus. X-ray shows persistent signs of ileus. Patient has been on Reglan 5 mg. which is increased to 10 mg. Patient has been having bowel movement. NG tube was removed on 01/08/2016. Leukocytosis resolved. We'll monitor the patient clinically. Currently on clear liquid diet per surgery. The diet is advance to full liquid, also add MiraLAX  2. Hiccups. Currently resolved Continue Thorazine as needed.  3. Anemia. Recent right TKA. Continue monitoring H&H.  4. Dehydration. Hypokalemia Continue gentle hydration. Replace potassium.  5. Recent right TKA. Continue PT in the hospital.  Pain management: When necessary Tylenol Activity: Continue physical therapy Bowel regimen: last BM 01/09/2016 Diet: Clear liquid diet DVT Prophylaxis: subcutaneous Heparin  Advance goals of care discussion: full code  Family Communication: family was present at bedside, at the time of interview. The pt provided permission to discuss medical plan with the family. Opportunity was given to ask question  and all questions were answered satisfactorily.   Disposition:  Discharge to home, with home health Expected discharge date: 01/12/2016  Consultants: Gen. surgery Procedures: None  Antibiotics: Anti-infectives    None        Intake/Output Summary (Last 24 hours) at 01/09/16 1558 Last data filed at 01/09/16 1354  Gross per 24 hour  Intake   2934 ml  Output      0 ml  Net   2934 ml   Filed Weights   01/07/16 0543 01/08/16 0539 01/09/16 0446  Weight: 104.509 kg (230 lb 6.4 oz) 105.96 kg (233 lb 9.6 oz) 95.709 kg (211 lb)    Objective: Physical Exam: Filed Vitals:   01/08/16 2055 01/09/16 0446 01/09/16 1354 01/09/16 1457  BP: 124/73 127/81 124/69 120/58  Pulse: 84 110 93 73  Temp: 97.7 F (36.5 C) 97.9 F (36.6 C) 97.5 F (36.4 C) 99.5 F (37.5 C)  TempSrc: Oral Oral Oral Oral  Resp: 19 18 17 16   Height:      Weight:  95.709 kg (211 lb)    SpO2: 98% 98% 100% 100%    General: Alert, Awake and Oriented to Time, Place and Person. Appear in mild distress Eyes: PERRL, Conjunctiva normal ENT: Oral Mucosa clear moist. Cardiovascular: S1 and S2 Present, no Murmur, Respiratory: Bilateral Air entry equal and Decreased, Clear to Auscultation, no Crackles, no wheezes Abdomen: Bowel Sound present, Soft and no tenderness Extremities: no Pedal edema, no calf tenderness  Data Reviewed: CBC:  Recent Labs Lab 01/03/16 0617  01/05/16 0747 01/06/16 0642 01/07/16 0509 01/08/16 0424 01/09/16 0550  WBC 4.2  < > 5.5 8.7 9.8 12.2* 9.5  NEUTROABS 2.9  --   --   --   --   --  7.6  HGB 11.5*  < > 9.9* 10.4* 10.2* 9.4* 9.5*  HCT 32.1*  < > 29.5* 31.1* 30.8* 28.2* 28.4*  MCV 92.5  < > 96.1 95.4 95.7 94.6 94.4  PLT 443*  < > 441* 436* 407* 417* 379  < > = values in this interval not displayed. Basic Metabolic Panel:  Recent Labs Lab 01/05/16 0747 01/06/16 0642 01/07/16 0509 01/08/16 0424 01/09/16 0550  NA 137 135 138 137 136  K 3.6 3.5 3.5 3.4* 3.7  CL 99* 102 105 105  106  CO2 23 19* 19* 21* 21*  GLUCOSE 80 96 81 130* 108*  BUN 19 13 10 7 6   CREATININE 0.92 0.84 0.97 0.79 0.76  CALCIUM 7.9* 7.7* 7.8* 7.9* 7.8*  MG  --   --   --   --  2.0    Liver Function Tests:  Recent Labs Lab 01/03/16 0617 01/04/16 0657 01/09/16 0550  AST 20 23 17   ALT 17 19 19   ALKPHOS 47 47 46  BILITOT 1.2 1.1 0.8  PROT 6.1* 5.6* 4.9*  ALBUMIN 3.1* 2.8* 2.1*    Recent Labs Lab 01/03/16 0617  LIPASE 15   Studies: No results found.   Scheduled Meds: . heparin  5,000 Units Subcutaneous Q8H  . pantoprazole (PROTONIX) IV  40 mg Intravenous Q12H   Continuous Infusions: . 0.9 % NaCl with KCl 20 mEq / L 75 mL/hr at 01/09/16 1537   PRN Meds: acetaminophen **OR** acetaminophen, chlorproMAZINE (THORAZINE) injection, chlorproMAZINE, hydrALAZINE, ondansetron **OR** ondansetron (ZOFRAN) IV  Time spent: 30 minutes  Author: Berle Mull, MD [facility] Pager: [phone] 01/09/2016 3:58 PM  If 7PM-7AM, please contact night-coverage at [email or web address], password [facility]

## 2016-01-09 NOTE — Progress Notes (Signed)
Orthopedic Tech Progress Note Patient Details:  Daniel Yu 24-Apr-1932 [id number]  Patient ID: Daniel Yu, male   DOB: 10/23/1931, 82 y.o.   MRN: [id number] Pt does not want to get in cpm now. Wants to get in at [facility]. i will pass this on to next shift   Karolee Stamps 01/09/2016, 5:52 AM

## 2016-01-09 NOTE — Progress Notes (Signed)
  Subjective: He continues to say he feels better. Ng was removed yesterday  Objective: Vital signs in last 24 hours: Temp:  [97.5 F (36.4 C)-97.9 F (36.6 C)] 97.9 F (36.6 C) (05/21 0446) Pulse Rate:  [73-110] 110 (05/21 0446) Resp:  [18-19] 18 (05/21 0446) BP: (124-134)/(73-81) 127/81 mmHg (05/21 0446) SpO2:  [98 %-99 %] 98 % (05/21 0446) Weight:  [95.709 kg (211 lb)] 95.709 kg (211 lb) (05/21 0446) Last BM Date: 01/08/16  Intake/Output from previous day: 05/20 0701 - 05/21 0700 In: 2636 [P.O.:836; I.V.:1800] Out: -  Intake/Output this shift: Total I/O In: 238 [P.O.:238] Out: -   Resp: clear to auscultation bilaterally Cardio: regular rate and rhythm GI: soft, nontender. seems less distended. passing flatus  Lab Results:   Recent Labs  01/08/16 0424 01/09/16 0550  WBC 12.2* 9.5  HGB 9.4* 9.5*  HCT 28.2* 28.4*  PLT 417* 379   BMET  Recent Labs  01/08/16 0424 01/09/16 0550  NA 137 136  K 3.4* 3.7  CL 105 106  CO2 21* 21*  GLUCOSE 130* 108*  BUN 7 6  CREATININE 0.79 0.76  CALCIUM 7.9* 7.8*   PT/INR No results for input(s): LABPROT, INR in the last 72 hours. ABG No results for input(s): PHART, HCO3 in the last 72 hours.  Invalid input(s): PCO2, PO2  Studies/Results: No results found.  Anti-infectives: Anti-infectives    None      Assessment/Plan: s/p * No surgery found * allow clears today  Continue to monitor closely Knee per ortho  LOS: 6 days    TOTH III,Zona Pedro S 01/09/2016

## 2016-01-10 DIAGNOSIS — Z9889 Other specified postprocedural states: Secondary | ICD-10-CM

## 2016-01-10 DIAGNOSIS — E876 Hypokalemia: Secondary | ICD-10-CM

## 2016-01-10 DIAGNOSIS — R066 Hiccough: Secondary | ICD-10-CM

## 2016-01-10 DIAGNOSIS — K5669 Other intestinal obstruction: Secondary | ICD-10-CM

## 2016-01-10 LAB — CBC
HCT: 27.6 % — ABNORMAL LOW (ref 39.0–52.0)
Hemoglobin: 8.9 g/dL — ABNORMAL LOW (ref 13.0–17.0)
MCH: 31.2 pg (ref 26.0–34.0)
MCHC: 32.2 g/dL (ref 30.0–36.0)
MCV: 96.8 fL (ref 78.0–100.0)
PLATELETS: 357 10*3/uL (ref 150–400)
RBC: 2.85 MIL/uL — ABNORMAL LOW (ref 4.22–5.81)
RDW: 13.3 % (ref 11.5–15.5)
WBC: 8.3 10*3/uL (ref 4.0–10.5)

## 2016-01-10 LAB — MAGNESIUM: Magnesium: 1.8 mg/dL (ref 1.7–2.4)

## 2016-01-10 LAB — BASIC METABOLIC PANEL
Anion gap: 5 (ref 5–15)
BUN: 6 mg/dL (ref 6–20)
CO2: 20 mmol/L — ABNORMAL LOW (ref 22–32)
CREATININE: 0.73 mg/dL (ref 0.61–1.24)
Calcium: 7.7 mg/dL — ABNORMAL LOW (ref 8.9–10.3)
Chloride: 110 mmol/L (ref 101–111)
GFR calc Af Amer: >60 mL/min (ref >60)
Glucose, Bld: 104 mg/dL — ABNORMAL HIGH (ref 65–99)
Potassium: 3.9 mmol/L (ref 3.5–5.1)
SODIUM: 135 mmol/L (ref 135–145)

## 2016-01-10 MED ORDER — ONDANSETRON HCL 4 MG PO TABS: 4.0000 mg | ORAL_TABLET | Freq: Four times a day (QID) | ORAL | Status: DC | PRN

## 2016-01-10 MED ORDER — CHLORPROMAZINE HCL 10 MG PO TABS: 10.0000 mg | ORAL_TABLET | Freq: Three times a day (TID) | ORAL | Status: DC | PRN

## 2016-01-10 MED ORDER — METOCLOPRAMIDE HCL 5 MG/ML IJ SOLN
5.0000 mg | Freq: Four times a day (QID) | INTRAMUSCULAR | Status: DC
  Administered 2016-01-10: 5 mg via INTRAVENOUS
  Filled 2016-01-10: qty 2

## 2016-01-10 NOTE — Discharge Instructions (Signed)
Ileus ° Ileus is a condition in which the intestines, also called the bowels, stop working and moving correctly. If the intestines stop working, food cannot pass through to get digested. The intestines are hollow organs that digest food after the food leaves the stomach. These organs are long, muscular tubes that connect the stomach to the rectum. When ileus occurs, the muscular contractions that cause food to move through the intestines stop happening as they normally would. °Ileus can occur for various reasons. This condition is a serious problem that usually requires hospitalization. It can cause symptoms such as nausea, abdominal pain, and bloating. Ileus can last from a few hours to a few days. If the intestines stop working because of a blockage, that is a different condition that is called a bowel obstruction. °CAUSES °This condition may be caused by: °· Surgery on the abdomen. °· An infection or inflammation in the abdomen. This includes inflammation of the lining of the abdomen (peritonitis). °· Infection or inflammation in other parts of the body, such as pneumonia or pancreatitis. °· Passage of gallstones or kidney stones. °· Damage to the nerves or blood vessels that go to the intestines. °· A collection of blood within the abdominal cavity. °· Imbalance in the salts in the blood (electrolytes). °· Injury to the brain or spinal cord. °· Medicines. Many medicines, including strong pain medicines, can cause ileus or make it worse. °SYMPTOMS °Symptoms of this condition include: °· Bloating of the abdomen. °· Pain or discomfort in the abdomen. °· Poor appetite. °· Nausea and vomiting. °· Lack of normal bowel sounds, such as "growling" in the stomach. °DIAGNOSIS °This condition may be diagnosed with: °· A physical exam and medical history. °· X-rays or a CT scan of the abdomen. °You may also have other tests to help find the cause of the condition. °TREATMENT °Treatment for this condition may  include: °· Resting the intestines until they start to work again. This is often done by: °¨ Stopping oral intake of food and drink. You will be given fluid through an IV tube to prevent dehydration. °¨ Placing a small tube (nasogastric tube or NG tube) that is passed through your nose and into your stomach. The tube is attached to a suction device and keeps the stomach emptied out. This allows the bowels to rest and also helps to reduce nausea and vomiting. °· Correcting any electrolyte imbalance by giving supplements in the IV fluid. °· Stopping any medicines that might make ileus worse. °· Treating any condition that may have caused ileus. °HOME CARE INSTRUCTIONS °· Follow instructions from your health care provider about diet and fluid intake. Usually, you will be told to: °¨ Drink plenty of clear fluids. °¨ Avoid alcohol. °¨ Avoid caffeine. °¨ Eat a bland diet. °· Get plenty of rest. Return to your normal activities as told by your health care provider. °· Take over-the-counter and prescription medicines only as told by your health care provider. °· Keep all follow-up visits as told by your health care provider. This is important. °SEEK MEDICAL CARE IF: °· You have nausea, vomiting, or abdominal discomfort. °· You have a fever. °SEEK IMMEDIATE MEDICAL CARE IF: °· You have severe abdominal pain or bloating. °· You cannot eat or drink without vomiting. °  °This information is not intended to replace advice given to you by your health care provider. Make sure you discuss any questions you have with your health care provider. °  °Document Released: 08/10/2003 Document Revised: 04/28/2015 Document   Reviewed: 10/01/2014 °Elsevier Interactive Patient Education ©2016 [employer] ° °

## 2016-01-10 NOTE — Progress Notes (Signed)
Orthopedic Tech Progress Note Patient Details:  Daniel Yu Apr 12, 1932 [id number]  Patient ID: Daniel Yu, male   DOB: [birth year], 82 y.o.   MRN: [id number] Pt wants to wait till between 9am and 10am to get in cpm. i will pass this on to next shift.  Karolee Stamps 01/10/2016, 5:41 AM

## 2016-01-10 NOTE — Progress Notes (Signed)
Patient ID: Daniel Yu, male   DOB: Jun 05, 1932, 82 y.o.   MRN: [id number]     [facility] [place], [place] [address]    Phone: [phone] FAX: [phone]     Subjective: No n/v. No abd pain.  Had several BMs.  Tolerating clears.  Objective:  Vital signs:  Filed Vitals:   01/09/16 1354 01/09/16 2023 01/10/16 0427 01/10/16 0458  BP: 124/69 140/73  129/71  Pulse: 93 99  98  Temp: 97.5 F (36.4 C) 98.3 F (36.8 C)  98.1 F (36.7 C)  TempSrc: Oral Oral  Oral  Resp: 17 19  19   Height:      Weight:   97.5 kg (214 lb 15.2 oz)   SpO2: 100% 100%  100%    Last BM Date: 01/09/16  Intake/Output   Yesterday:  05/21 0701 - 05/22 0700 In: 2937 [P.O.:1132; I.V.:1805] Out: -  This shift: I/O last 3 completed shifts: In: 4122 [P.O.:1492; I.V.:2630] Out: -     Physical Exam: General: Pt awake/alert/oriented x4 in no acute distress Abdomen: Soft.  Nondistended.  Non tender.  No evidence of peritonitis.  No incarcerated hernias.   Problem List:   Principal Problem:   SBO (small bowel obstruction) (HCC) Active Problems:   Dehydration with hyponatremia   S/P right knee surgery/TKA    Acute hypokalemia   HTN (hypertension)   Acute hyperglycemia    Results:   Labs: Results for orders placed or performed during the hospital encounter of 01/03/16 (from the past 48 hour(s))  CBC with Differential/Platelet     Status: Abnormal   Collection Time: 01/09/16  5:50 AM  Result Value Ref Range   WBC 9.5 4.0 - 10.5 K/uL   RBC 3.01 (L) 4.22 - 5.81 MIL/uL   Hemoglobin 9.5 (L) 13.0 - 17.0 g/dL   HCT 28.4 (L) 39.0 - 52.0 %   MCV 94.4 78.0 - 100.0 fL   MCH 31.6 26.0 - 34.0 pg   MCHC 33.5 30.0 - 36.0 g/dL   RDW 13.4 11.5 - 15.5 %   Platelets 379 150 - 400 K/uL   Neutrophils Relative % 81 %   Neutro Abs 7.6 1.7 - 7.7 K/uL   Lymphocytes Relative 9 %   Lymphs Abs 0.9 0.7 - 4.0 K/uL   Monocytes Relative 7 %   Monocytes Absolute 0.7 0.1 - 1.0 K/uL   Eosinophils Relative 3 %   Eosinophils Absolute 0.3 0.0 - 0.7 K/uL   Basophils Relative 0 %   Basophils Absolute 0.0 0.0 - 0.1 K/uL  Comprehensive metabolic panel     Status: Abnormal   Collection Time: 01/09/16  5:50 AM  Result Value Ref Range   Sodium 136 135 - 145 mmol/L   Potassium 3.7 3.5 - 5.1 mmol/L   Chloride 106 101 - 111 mmol/L   CO2 21 (L) 22 - 32 mmol/L   Glucose, Bld 108 (H) 65 - 99 mg/dL   BUN 6 6 - 20 mg/dL   Creatinine, Ser 0.76 0.61 - 1.24 mg/dL   Calcium 7.8 (L) 8.9 - 10.3 mg/dL   Total Protein 4.9 (L) 6.5 - 8.1 g/dL   Albumin 2.1 (L) 3.5 - 5.0 g/dL   AST 17 15 - 41 U/L   ALT 19 17 - 63 U/L   Alkaline Phosphatase 46 38 - 126 U/L   Total Bilirubin 0.8 0.3 - 1.2 mg/dL   GFR calc  non Af Amer >60 >60 mL/min   GFR calc Af Amer >60 >60 mL/min    Comment: (NOTE) The eGFR has been calculated using the CKD EPI equation. This calculation has not been validated in all clinical situations. eGFR's persistently <60 mL/min signify possible Chronic Kidney Disease.    Anion gap 9 5 - 15  Magnesium     Status: None   Collection Time: 01/09/16  5:50 AM  Result Value Ref Range   Magnesium 2.0 1.7 - 2.4 mg/dL  Basic metabolic panel     Status: Abnormal   Collection Time: 01/10/16  4:40 AM  Result Value Ref Range   Sodium 135 135 - 145 mmol/L   Potassium 3.9 3.5 - 5.1 mmol/L   Chloride 110 101 - 111 mmol/L   CO2 20 (L) 22 - 32 mmol/L   Glucose, Bld 104 (H) 65 - 99 mg/dL   BUN 6 6 - 20 mg/dL   Creatinine, Ser 0.73 0.61 - 1.24 mg/dL   Calcium 7.7 (L) 8.9 - 10.3 mg/dL   GFR calc non Af Amer >60 >60 mL/min   GFR calc Af Amer >60 >60 mL/min    Comment: (NOTE) The eGFR has been calculated using the CKD EPI equation. This calculation has not been validated in all clinical situations. eGFR's persistently <60 mL/min signify possible Chronic Kidney Disease.    Anion gap 5 5 - 15  CBC     Status: Abnormal   Collection Time: 01/10/16   4:40 AM  Result Value Ref Range   WBC 8.3 4.0 - 10.5 K/uL   RBC 2.85 (L) 4.22 - 5.81 MIL/uL   Hemoglobin 8.9 (L) 13.0 - 17.0 g/dL   HCT 27.6 (L) 39.0 - 52.0 %   MCV 96.8 78.0 - 100.0 fL   MCH 31.2 26.0 - 34.0 pg   MCHC 32.2 30.0 - 36.0 g/dL   RDW 13.3 11.5 - 15.5 %   Platelets 357 150 - 400 K/uL  Magnesium     Status: None   Collection Time: 01/10/16  4:40 AM  Result Value Ref Range   Magnesium 1.8 1.7 - 2.4 mg/dL    Imaging / Studies: No results found.  Medications / Allergies:  Scheduled Meds: . heparin  5,000 Units Subcutaneous Q8H  . metoCLOPramide (REGLAN) injection  5 mg Intravenous Q6H  . pantoprazole (PROTONIX) IV  40 mg Intravenous Q12H   Continuous Infusions: . 0.9 % NaCl with KCl 20 mEq / L 75 mL/hr at 01/10/16 0427   PRN Meds:.acetaminophen **OR** acetaminophen, chlorproMAZINE (THORAZINE) injection, chlorproMAZINE, hydrALAZINE, ondansetron **OR** ondansetron (ZOFRAN) IV  Antibiotics: Anti-infectives    None        Assessment/Plan Ileus-resolved, advance diet as tolerated, wean reglan.  Continue to mobilize  FEN-fulls, advance VTE prophylaxis-SCD/heparin  Dispo-per primary team   Erby Pian, [facility] Surgery Pager [phone]) For consults and floor pages call [phone])  01/10/2016 8:04 AM

## 2016-01-10 NOTE — Progress Notes (Signed)
Pt discharged to home.  Discharge instructions explained to pt.  Pt has no questions at the time of discharge.  IV removed.  Pt states he has all belongings.  Pt taken off unit in wheelchair by staff.

## 2016-01-10 NOTE — Discharge Summary (Signed)
Physician Discharge Summary  Daniel Yu [id number] DOB: [birth year] DOA: 01/03/2016  PCP: Irven Shelling, MD  Admit date: 01/03/2016 Discharge date: 01/10/2016  Recommendations for Outpatient Follow-up:  Also note, if you have already completed 3 weeks of aspirin per prior recommendations after Xarelto then you don't have to take aspirin. You're supposed to finish 3 weeks of aspirin after completion of the Xarelto  Discharge Diagnoses:  Principal Problem:   SBO (small bowel obstruction) ([place]) Active Problems:   Dehydration with hyponatremia   S/P right knee surgery/TKA    Acute hypokalemia   HTN (hypertension)   Acute hyperglycemia    Discharge Condition: stable   Diet recommendation: as tolerated   History of present illness:  Per brief narrative 01/09/2016  "82 y/o white male with PMH OA s/p recent TKA on 12/27/15, prior left inguinal hernia repair. During the hospitalization patient did well and was discharged home w/ PT, right knee brace and Xarelto for DVT prophylaxis. Within 24 hours after discharging home patient began developing progressive abdominal bloating associated with N/V, abd discomfort. Abdominal imaging consistent with small bowel obstruction. Surgery consulted."  Hospital Course:   Assessment and Plan:  SBO (small bowel obstruction) ([place]) versus ileus - At this point resolved, patient feels better, no nausea or vomiting. Had one bowel movement yesterday and this morning - NG tube removed 01/08/2016 - He tolerates regular diet - Surgery signed off   Hiccups - Continue Thorazine as needed.   Anemia, normocytic - Hemoglobin stable at 8.9   Hypokalemia - Likely due to GI losses - Potassium now within normal limits   Recent right TKA. - PT in the hospital.  Diet: Clear liquid diet; advanced to reg this am, so far tolerates well  DVT Prophylaxis: subcutaneous Heparin  Advance goals of care discussion: full code Family Communication: family  was present at bedside   Consultants Gen. Surgery  Procedure None  Antibiotics None     Signed:  Leisa Lenz, MD  [facility] 01/10/2016, 11:02 AM  Pager #: [phone]  Time spent in minutes: more than 30 minutes  Discharge Exam: Filed Vitals:   01/09/16 2023 01/10/16 0458  BP: 140/73 129/71  Pulse: 99 98  Temp: 98.3 F (36.8 C) 98.1 F (36.7 C)  Resp: 19 19   Filed Vitals:   01/09/16 1354 01/09/16 2023 01/10/16 0427 01/10/16 0458  BP: 124/69 140/73  129/71  Pulse: 93 99  98  Temp: 97.5 F (36.4 C) 98.3 F (36.8 C)  98.1 F (36.7 C)  TempSrc: Oral Oral  Oral  Resp: 17 19  19   Height:      Weight:   97.5 kg (214 lb 15.2 oz)   SpO2: 100% 100%  100%    General: Pt is alert, follows commands appropriately, not in acute distress Cardiovascular: Regular rate and rhythm, S1/S2 +, no murmurs Respiratory: Clear to auscultation bilaterally, no wheezing, no crackles, no rhonchi Abdominal: Soft, non tender, less distended, bowel sounds +, no guarding Extremities: no edema, no cyanosis, pulses palpable bilaterally DP and PT Neuro: Grossly nonfocal  Discharge Instructions  Discharge Instructions    Call MD for:  difficulty breathing, headache or visual disturbances    Complete by:  As directed      Call MD for:  persistant dizziness or light-headedness    Complete by:  As directed      Call MD for:  persistant nausea and vomiting    Complete by:  As directed  Call MD for:  severe uncontrolled pain    Complete by:  As directed      Diet - low sodium heart healthy    Complete by:  As directed      Discharge instructions    Complete by:  As directed   Also note, if you have already completed 3 weeks of aspirin per prior recommendations after Xarelto then you don't have to take aspirin. You're supposed to finish 3 weeks of aspirin after completion of the Xarelto.     Increase activity slowly    Complete by:  As directed             Medication  List    STOP taking these medications        cyclobenzaprine 10 MG tablet  Commonly known as:  FLEXERIL     rivaroxaban 10 MG Tabs tablet  Commonly known as:  XARELTO      TAKE these medications        acetaminophen 500 MG tablet  Commonly known as:  TYLENOL  Take 1,000 mg by mouth every 6 (six) hours as needed for mild pain.     chlorproMAZINE 10 MG tablet  Commonly known as:  THORAZINE  Take 1-2.5 tablets (10-25 mg total) by mouth 3 (three) times daily as needed for hiccoughs.     fluticasone 110 MCG/ACT inhaler  Commonly known as:  FLOVENT HFA  Inhale 2 puffs into the lungs 2 (two) times daily.     ondansetron 4 MG tablet  Commonly known as:  ZOFRAN  Take 1 tablet (4 mg total) by mouth every 6 (six) hours as needed for nausea.     valsartan-hydrochlorothiazide 160-12.5 MG tablet  Commonly known as:  DIOVAN-HCT  Take 1 tablet by mouth every morning.           Follow-up Information    Follow up with Irven Shelling, MD. Schedule an appointment as soon as possible for a visit in 1 week.   Specialty:  Internal Medicine   Why:  Follow up appt after recent hospitalization   Contact information:   [address] [place] [address] [phone]        The results of significant diagnostics from this hospitalization (including imaging, microbiology, ancillary and laboratory) are listed below for reference.    Significant Diagnostic Studies: Dg Abd 1 View  01/07/2016  CLINICAL DATA:  82 year old male with postoperative ileus following knee replacement EXAM: ABDOMEN - 1 VIEW COMPARISON:  Prior abdominal radiographs 01/06/2016 FINDINGS: Persistent gaseous distention of numerous loops of small bowel throughout the abdomen. Maximal small bowel diameter is 4.9 cm which is similar compared to prior imaging. No large free air on this single supine view. Multilevel degenerative disc disease and lower lumbar facet arthropathy. IMPRESSION: Persistent ileus  pattern versus partial small bowel obstruction. Maximal small bowel diameter is 4.9 cm. Electronically Signed   By: Jacqulynn Cadet M.D.   On: 01/07/2016 07:51   Dg Abd 1 View  01/04/2016  CLINICAL DATA:  Postoperative ileus.  Abdominal pain and distention. EXAM: ABDOMEN - 1 VIEW COMPARISON:  01/04/2016 abdominal radiograph. FINDINGS: There is persistent diffuse gaseous distention of the small and large bowel, not appreciably changed. No evidence of pneumatosis or pneumoperitoneum. No pathologic soft tissue calcifications. Marked degenerative changes throughout the visualized thoracolumbar spine. IMPRESSION: No appreciable change in diffuse gaseous distention of the small and large bowel, most consistent with adynamic ileus. Electronically Signed   By: Ilona Sorrel  M.D.   On: 01/04/2016 08:29   Ct Abdomen Pelvis W Contrast  01/03/2016  CLINICAL DATA:  Recent knee surgery, abdominal pain/vomiting, evaluate for obstruction EXAM: CT ABDOMEN AND PELVIS WITH CONTRAST TECHNIQUE: Multidetector CT imaging of the abdomen and pelvis was performed using the standard protocol following bolus administration of intravenous contrast. CONTRAST:  122mL ISOVUE-300 IOPAMIDOL (ISOVUE-300) INJECTION 61% COMPARISON:  None. FINDINGS: Lower chest: Mild patchy bilateral lower lobe opacities, likely atelectasis. Hepatobiliary: Liver is within normal limits. No suspicious/enhancing hepatic lesions. Gallbladder is unremarkable. No intrahepatic or extrahepatic ductal dilatation. Pancreas: Within normal limits. Spleen: Within normal limits. Adrenals/Urinary Tract: Adrenal glands are within normal limits. Kidneys are within normal limits.  No hydronephrosis. Bladder is within normal limits. Stomach/Bowel: Stomach is notable for a tiny hiatal hernia. Multiple dilated loops of small bowel. Right colon is also dilated, including the ascending transverse, and descending colon. Sigmoid colon and rectum are mildly decompressed. If a transition  point is present, it is in the left lower quadrant/ inguinal region (series 2/image 87), at the site of a prior inguinal hernia repair, although no definite hernia is present. Vascular/Lymphatic: Atherosclerotic calcifications of the abdominal aorta and branch vessels. No evidence of abdominal aortic aneurysm. No suspicious abdominopelvic lymphadenopathy. Reproductive: Prostate is grossly unremarkable. Other: Small volume perihepatic ascites.  Trace left pelvic ascites. Postsurgical changes related to prior bilateral inguinal hernia repairs. Musculoskeletal: Moderate degenerative changes of the visualized thoracolumbar spine. IMPRESSION: Dilated loops of small and large bowel. Relative decompression of the sigmoid colon and rectum. Overall, in the setting of recent surgery, adynamic postoperative ileus is suspected. Colonic obstruction with relative transition in the left inguinal region is considered less likely given the lack of a true left inguinal hernia on CT. Status post bilateral inguinal hernia repair. Electronically Signed   By: Julian Hy M.D.   On: 01/03/2016 08:51   Dg Abd 2 Views  01/06/2016  CLINICAL DATA:  Postoperative day 7 status post total knee arthroplasty complicated by postoperative ileus. EXAM: ABDOMEN - 2 VIEW COMPARISON:  01/04/2016 abdominal radiograph. FINDINGS: Enteric tube terminates in the proximal stomach. Diffuse mild to moderate gaseous distention of the small and large bowel with air-fluid levels throughout the small and large bowel, with the degree of dilatation not appreciably changed. No evidence of pneumatosis or pneumoperitoneum. Vascular calcifications in the pelvis. Trace right pleural effusion. Moderate degenerative changes in the visualized thoracolumbar spine. IMPRESSION: Diffuse mild to moderate gaseous distention of the small and large bowel with air-fluid levels, consistent with adynamic ileus, with no appreciable radiographic improvement. Electronically  Signed   By: Ilona Sorrel M.D.   On: 01/06/2016 09:52   Dg Abd Acute W/chest  01/03/2016  CLINICAL DATA:  Vomiting. Right knee replacement 1 week ago. Nausea for 3 days, vomiting for 1 day, abdominal distention for 2 days. EXAM: DG ABDOMEN ACUTE W/ 1V CHEST COMPARISON:  None. FINDINGS: Low lung volumes with linear atelectasis or fibrosis in the lung bases. Probable emphysematous changes in the lungs. Normal heart size and pulmonary vascularity. No focal consolidation or airspace disease. Calcified and tortuous aorta. Diffuse gaseous distention of small bowel with multiple air-fluid levels. There is some prominence of gas-filled colon as well. Although these changes could represent ileus, the degree of small bowel dilatation is more than would be expected for ileus and obstruction is suggested. No free intra-abdominal air. No radiopaque stones. Degenerative changes in the spine and hips. Vascular calcifications. IMPRESSION: No evidence of active pulmonary disease. Prominent gas distended small  bowel with air-fluid levels likely represent small bowel obstruction. There is some gas distention of colon is well. Level of obstruction is not indicated. Electronically Signed   By: Lucienne Capers M.D.   On: 01/03/2016 06:46   Dg Abd Portable 1v-small Bowel Obstruction Protocol-initial, 8 Hr Delay  01/04/2016  CLINICAL DATA:  Small bowel obstruction.  8 hour delayed. EXAM: PORTABLE ABDOMEN - 1 VIEW COMPARISON:  01/03/2016 FINDINGS: Prominent gaseous distention of small and large bowel most likely to represent adynamic ileus. Enteric tube is not visualized but may be off the field of view. Degenerative changes in the lumbar spine and hips. Residual contrast material in the bladder. IMPRESSION: Diffuse gaseous distention of small and large bowel most likely to represent ileus. Electronically Signed   By: Lucienne Capers M.D.   On: 01/04/2016 02:18   Dg Abd Portable 1v-small Bowel Protocol-position  Verification  01/03/2016  CLINICAL DATA:  Followup small bowel obstruction. Nasogastric tube placement. EXAM: PORTABLE ABDOMEN - 1 VIEW 2:24 p.m.: COMPARISON:  CT abdomen and pelvis and acute abdomen series performed earlier today. FINDINGS: Nasogastric tube tip in the fundus of the stomach. Persistent marked dilation of multiple loops of small bowel throughout the abdomen and pelvis. Contrast from the CT earlier today within the normal-appearing urinary tract. IMPRESSION: Nasogastric tube tip in the fundus of the stomach. Persistent small bowel obstruction. Electronically Signed   By: Evangeline Dakin M.D.   On: 01/03/2016 14:36    Microbiology: No results found for this or any previous visit (from the past 240 hour(s)).   Labs: Basic Metabolic Panel:  Recent Labs Lab 01/06/16 0642 01/07/16 0509 01/08/16 0424 01/09/16 0550 01/10/16 0440  NA 135 138 137 136 135  K 3.5 3.5 3.4* 3.7 3.9  CL 102 105 105 106 110  CO2 19* 19* 21* 21* 20*  GLUCOSE 96 81 130* 108* 104*  BUN 13 10 7 6 6   CREATININE 0.84 0.97 0.79 0.76 0.73  CALCIUM 7.7* 7.8* 7.9* 7.8* 7.7*  MG  --   --   --  2.0 1.8   Liver Function Tests:  Recent Labs Lab 01/04/16 0657 01/09/16 0550  AST 23 17  ALT 19 19  ALKPHOS 47 46  BILITOT 1.1 0.8  PROT 5.6* 4.9*  ALBUMIN 2.8* 2.1*   No results for input(s): LIPASE, AMYLASE in the last 168 hours. No results for input(s): AMMONIA in the last 168 hours. CBC:  Recent Labs Lab 01/06/16 0642 01/07/16 0509 01/08/16 0424 01/09/16 0550 01/10/16 0440  WBC 8.7 9.8 12.2* 9.5 8.3  NEUTROABS  --   --   --  7.6  --   HGB 10.4* 10.2* 9.4* 9.5* 8.9*  HCT 31.1* 30.8* 28.2* 28.4* 27.6*  MCV 95.4 95.7 94.6 94.4 96.8  PLT 436* 407* 417* 379 357   Cardiac Enzymes: No results for input(s): CKTOTAL, CKMB, CKMBINDEX, TROPONINI in the last 168 hours. BNP: BNP (last 3 results) No results for input(s): BNP in the last 8760 hours.  ProBNP (last 3 results) No results for input(s):  PROBNP in the last 8760 hours.  CBG: No results for input(s): GLUCAP in the last 168 hours.

## 2016-01-10 NOTE — Progress Notes (Signed)
Patient ID: Daniel Yu, male   DOB: 09/12/1931, 82 y.o.   MRN: [id number]  Will sign off.  Call back if needed.

## 2016-01-10 NOTE — Progress Notes (Addendum)
Physical Therapy Treatment Patient Details Name: Daniel Yu MRN: [id number] DOB: 05-12-1932 Today's Date: 01/19/2016    History of Present Illness Pt is an 82 y.o. male admitted with SBO vs colonic obstruction vs ileus s/p TKA on 12/27/15. PMH: hypertension, asthma.     PT Comments    Pt performed gait training with progression to cane.  Pt required cues for sequencing and reciprocal arm swing.  Pt is ready for d/c and will buy cane at d/c.    Follow Up Recommendations  Home health PT;Outpatient PT     Equipment Recommendations  None recommended by PT    Recommendations for Other Services       Precautions / Restrictions Precautions Precautions: Knee;Fall Precaution Comments: reviewed knee extension  Required Braces or Orthoses: Knee Immobilizer - Right Restrictions Weight Bearing Restrictions: No Other Position/Activity Restrictions: WBAT    Mobility  Bed Mobility                  Transfers Overall transfer level: Needs assistance Equipment used: Straight cane Transfers: Sit to/from Stand Sit to Stand: Supervision         General transfer comment: Cues for hand position to use heel of hand while holding cane to maintain balance and use of hand.    Ambulation/Gait Ambulation/Gait assistance: Supervision Ambulation Distance (Feet): 600 Feet Assistive device: Straight cane Gait Pattern/deviations: Step-through pattern;Antalgic;Trunk flexed;Decreased weight shift to right Gait velocity: decreased   General Gait Details: Cues for sequencing with cane, reciprocal armswing, upright control.     Stairs            Wheelchair Mobility    Modified Rankin (Stroke Patients Only)       Balance     Sitting balance-Leahy Scale: Good       Standing balance-Leahy Scale: Poor                      Cognition Arousal/Alertness: Awake/alert Behavior During Therapy: WFL for tasks assessed/performed Overall Cognitive Status: Within Functional  Limits for tasks assessed                      Exercises      General Comments        Pertinent Vitals/Pain Pain Assessment: No/denies pain    Home Living                      Prior Function            PT Goals (current goals can now be found in the care plan section) Acute Rehab PT Goals Patient Stated Goal: get back on track with his knee Potential to Achieve Goals: Good Progress towards PT goals: Progressing toward goals    Frequency  Min 3X/week    PT Plan Current plan remains appropriate;Frequency needs to be updated    Co-evaluation             End of Session Equipment Utilized During Treatment: Gait belt Activity Tolerance: Patient tolerated treatment well Patient left: in bed;with call bell/phone within reach;with family/visitor present     Time: [id number] PT Time Calculation (min) (ACUTE ONLY): 23 min  Charges:  $Gait Training: 8-22 mins $Therapeutic Activity: 8-22 mins                    G Codes:      Cristela Blue Jan 19, 2016, 12:52 PM  Governor Rooks, PTA pager [phone]

## 2016-01-11 NOTE — Care Management Important Message (Signed)
Important Message  Patient Details  Name: Daniel Yu MRN: [id number] Date of Birth: Aug 03, 1932   Medicare Important Message Given:  Yes    Natoya Viscomi, [place] 01/11/2016, 1:43 PM

## 2017-02-08 ENCOUNTER — Encounter (HOSPITAL_COMMUNITY): Payer: Self-pay | Admitting: Emergency Medicine

## 2017-02-08 ENCOUNTER — Emergency Department (HOSPITAL_COMMUNITY): Payer: Medicare Other

## 2017-02-08 ENCOUNTER — Inpatient Hospital Stay (HOSPITAL_COMMUNITY)
Admission: EM | Admit: 2017-02-08 | Discharge: 2017-02-15 | DRG: 853 | Disposition: A | Discharge status: Home / Self Care | Payer: Medicare Other | Attending: Internal Medicine | Admitting: Internal Medicine

## 2017-02-08 DIAGNOSIS — E876 Hypokalemia: Secondary | ICD-10-CM | POA: Diagnosis present

## 2017-02-08 DIAGNOSIS — I10 Essential (primary) hypertension: Secondary | ICD-10-CM | POA: Diagnosis not present

## 2017-02-08 DIAGNOSIS — Z01810 Encounter for preprocedural cardiovascular examination: Secondary | ICD-10-CM

## 2017-02-08 DIAGNOSIS — A408 Other streptococcal sepsis: Secondary | ICD-10-CM | POA: Diagnosis not present

## 2017-02-08 DIAGNOSIS — R778 Other specified abnormalities of plasma proteins: Secondary | ICD-10-CM | POA: Diagnosis not present

## 2017-02-08 DIAGNOSIS — I481 Persistent atrial fibrillation: Secondary | ICD-10-CM | POA: Diagnosis not present

## 2017-02-08 DIAGNOSIS — Z79899 Other long term (current) drug therapy: Secondary | ICD-10-CM

## 2017-02-08 DIAGNOSIS — I5021 Acute systolic (congestive) heart failure: Secondary | ICD-10-CM | POA: Diagnosis present

## 2017-02-08 DIAGNOSIS — I4891 Unspecified atrial fibrillation: Secondary | ICD-10-CM | POA: Diagnosis present

## 2017-02-08 DIAGNOSIS — A4151 Sepsis due to Escherichia coli [E. coli]: Principal | ICD-10-CM | POA: Diagnosis present

## 2017-02-08 DIAGNOSIS — K529 Noninfective gastroenteritis and colitis, unspecified: Secondary | ICD-10-CM

## 2017-02-08 DIAGNOSIS — J45909 Unspecified asthma, uncomplicated: Secondary | ICD-10-CM | POA: Diagnosis present

## 2017-02-08 DIAGNOSIS — I480 Paroxysmal atrial fibrillation: Secondary | ICD-10-CM

## 2017-02-08 DIAGNOSIS — M1990 Unspecified osteoarthritis, unspecified site: Secondary | ICD-10-CM | POA: Diagnosis not present

## 2017-02-08 DIAGNOSIS — R062 Wheezing: Secondary | ICD-10-CM

## 2017-02-08 DIAGNOSIS — J4522 Mild intermittent asthma with status asthmaticus: Secondary | ICD-10-CM | POA: Diagnosis not present

## 2017-02-08 DIAGNOSIS — R7881 Bacteremia: Secondary | ICD-10-CM

## 2017-02-08 DIAGNOSIS — Z7982 Long term (current) use of aspirin: Secondary | ICD-10-CM | POA: Diagnosis not present

## 2017-02-08 DIAGNOSIS — K819 Cholecystitis, unspecified: Secondary | ICD-10-CM

## 2017-02-08 DIAGNOSIS — I340 Nonrheumatic mitral (valve) insufficiency: Secondary | ICD-10-CM | POA: Diagnosis not present

## 2017-02-08 DIAGNOSIS — K8000 Calculus of gallbladder with acute cholecystitis without obstruction: Secondary | ICD-10-CM | POA: Diagnosis present

## 2017-02-08 DIAGNOSIS — B9620 Unspecified Escherichia coli [E. coli] as the cause of diseases classified elsewhere: Secondary | ICD-10-CM

## 2017-02-08 DIAGNOSIS — R509 Fever, unspecified: Secondary | ICD-10-CM

## 2017-02-08 DIAGNOSIS — I110 Hypertensive heart disease with heart failure: Secondary | ICD-10-CM | POA: Diagnosis present

## 2017-02-08 DIAGNOSIS — K810 Acute cholecystitis: Secondary | ICD-10-CM

## 2017-02-08 DIAGNOSIS — Z7951 Long term (current) use of inhaled steroids: Secondary | ICD-10-CM

## 2017-02-08 DIAGNOSIS — R935 Abnormal findings on diagnostic imaging of other abdominal regions, including retroperitoneum: Secondary | ICD-10-CM | POA: Insufficient documentation

## 2017-02-08 DIAGNOSIS — A419 Sepsis, unspecified organism: Secondary | ICD-10-CM

## 2017-02-08 DIAGNOSIS — R748 Abnormal levels of other serum enzymes: Secondary | ICD-10-CM | POA: Diagnosis not present

## 2017-02-08 DIAGNOSIS — Z7901 Long term (current) use of anticoagulants: Secondary | ICD-10-CM

## 2017-02-08 DIAGNOSIS — I959 Hypotension, unspecified: Secondary | ICD-10-CM

## 2017-02-08 DIAGNOSIS — R7989 Other specified abnormal findings of blood chemistry: Secondary | ICD-10-CM | POA: Diagnosis not present

## 2017-02-08 HISTORY — DX: Adverse effect of unspecified anesthetic, initial encounter: T41.45XA

## 2017-02-08 HISTORY — DX: Cholecystitis, unspecified: K81.9

## 2017-02-08 HISTORY — DX: Other complications of anesthesia, initial encounter: T88.59XA

## 2017-02-08 LAB — I-STAT CHEM 8, ED
BUN: 16 mg/dL (ref 6–20)
CALCIUM ION: 1.04 mmol/L — AB (ref 1.15–1.40)
CREATININE: 0.8 mg/dL (ref 0.61–1.24)
Chloride: 102 mmol/L (ref 101–111)
GLUCOSE: 102 mg/dL — AB (ref 65–99)
HCT: 34 % — ABNORMAL LOW (ref 39.0–52.0)
HEMOGLOBIN: 11.6 g/dL — AB (ref 13.0–17.0)
POTASSIUM: 3.1 mmol/L — AB (ref 3.5–5.1)
Sodium: 136 mmol/L (ref 135–145)
TCO2: 18 mmol/L (ref 0–100)

## 2017-02-08 LAB — CREATININE, SERUM: CREATININE: 0.99 mg/dL (ref 0.61–1.24)

## 2017-02-08 LAB — CBC WITH DIFFERENTIAL/PLATELET
Basophils Absolute: 0 10*3/uL (ref 0.0–0.1)
Basophils Relative: 0 %
EOS ABS: 0 10*3/uL (ref 0.0–0.7)
Eosinophils Relative: 0 %
HCT: 36.9 % — ABNORMAL LOW (ref 39.0–52.0)
HEMOGLOBIN: 12.7 g/dL — AB (ref 13.0–17.0)
Lymphocytes Relative: 3 %
Lymphs Abs: 0.3 10*3/uL — ABNORMAL LOW (ref 0.7–4.0)
MCH: 32.6 pg (ref 26.0–34.0)
MCHC: 34.4 g/dL (ref 30.0–36.0)
MCV: 94.9 fL (ref 78.0–100.0)
Monocytes Absolute: 0.2 10*3/uL (ref 0.1–1.0)
Monocytes Relative: 2 %
NEUTROS ABS: 9.2 10*3/uL — AB (ref 1.7–7.7)
NEUTROS PCT: 95 %
Platelets: 200 10*3/uL (ref 150–400)
RBC: 3.89 MIL/uL — AB (ref 4.22–5.81)
RDW: 13.1 % (ref 11.5–15.5)
WBC: 9.6 10*3/uL (ref 4.0–10.5)

## 2017-02-08 LAB — MAGNESIUM: Magnesium: 1.9 mg/dL (ref 1.7–2.4)

## 2017-02-08 LAB — CBC
HCT: 36.4 % — ABNORMAL LOW (ref 39.0–52.0)
HEMATOCRIT: 35.1 % — AB (ref 39.0–52.0)
HEMOGLOBIN: 11.9 g/dL — AB (ref 13.0–17.0)
HEMOGLOBIN: 12.2 g/dL — AB (ref 13.0–17.0)
MCH: 32.5 pg (ref 26.0–34.0)
MCH: 32.6 pg (ref 26.0–34.0)
MCHC: 33.9 g/dL (ref 30.0–36.0)
MCHC: 33.5 g/dL (ref 30.0–36.0)
MCV: 95.9 fL (ref 78.0–100.0)
MCV: 97.3 fL (ref 78.0–100.0)
PLATELETS: 153 10*3/uL (ref 150–400)
Platelets: 164 10*3/uL (ref 150–400)
RBC: 3.66 MIL/uL — AB (ref 4.22–5.81)
RBC: 3.74 MIL/uL — AB (ref 4.22–5.81)
RDW: 13.2 % (ref 11.5–15.5)
RDW: 13.5 % (ref 11.5–15.5)
WBC: 14.7 10*3/uL — AB (ref 4.0–10.5)
WBC: 14.7 10*3/uL — AB (ref 4.0–10.5)

## 2017-02-08 LAB — COMPREHENSIVE METABOLIC PANEL
ALBUMIN: 2.7 g/dL — AB (ref 3.5–5.0)
ALT: 14 U/L — AB (ref 17–63)
AST: 28 U/L (ref 15–41)
Alkaline Phosphatase: 51 U/L (ref 38–126)
Anion gap: 11 (ref 5–15)
BUN: 15 mg/dL (ref 6–20)
CALCIUM: 7.7 mg/dL — AB (ref 8.9–10.3)
CO2: 17 mmol/L — ABNORMAL LOW (ref 22–32)
CREATININE: 1.01 mg/dL (ref 0.61–1.24)
Chloride: 106 mmol/L (ref 101–111)
GFR calc Af Amer: >60 mL/min (ref >60)
GFR calc non Af Amer: >60 mL/min (ref >60)
Glucose, Bld: 104 mg/dL — ABNORMAL HIGH (ref 65–99)
POTASSIUM: 3 mmol/L — AB (ref 3.5–5.1)
Sodium: 134 mmol/L — ABNORMAL LOW (ref 135–145)
Total Bilirubin: 1.3 mg/dL — ABNORMAL HIGH (ref 0.3–1.2)
Total Protein: 5.6 g/dL — ABNORMAL LOW (ref 6.5–8.1)

## 2017-02-08 LAB — SURGICAL PCR SCREEN
MRSA, PCR: NEGATIVE
Staphylococcus aureus: NEGATIVE

## 2017-02-08 LAB — I-STAT CG4 LACTIC ACID, ED
Lactic Acid, Venous: 3.94 mmol/L (ref 0.5–1.9)
Lactic Acid, Venous: 3.25 mmol/L (ref 0.5–1.9)

## 2017-02-08 LAB — URINALYSIS, ROUTINE W REFLEX MICROSCOPIC
Bilirubin Urine: NEGATIVE
GLUCOSE, UA: NEGATIVE mg/dL
Ketones, ur: NEGATIVE mg/dL
Leukocytes, UA: NEGATIVE
NITRITE: NEGATIVE
PH: 5 (ref 5.0–8.0)
Protein, ur: 30 mg/dL — AB
SPECIFIC GRAVITY, URINE: 1.028 (ref 1.005–1.030)
Squamous Epithelial / LPF: NONE SEEN

## 2017-02-08 LAB — POC OCCULT BLOOD, ED: Fecal Occult Bld: NEGATIVE

## 2017-02-08 LAB — TSH: TSH: 1.834 u[IU]/mL (ref 0.350–4.500)

## 2017-02-08 LAB — I-STAT TROPONIN, ED: Troponin i, poc: 0.03 ng/mL (ref 0.00–0.08)

## 2017-02-08 LAB — LIPASE, BLOOD: Lipase: 18 U/L (ref 11–51)

## 2017-02-08 LAB — PHOSPHORUS: PHOSPHORUS: 2.6 mg/dL (ref 2.5–4.6)

## 2017-02-08 LAB — LACTIC ACID, PLASMA
LACTIC ACID, VENOUS: 2.6 mmol/L — AB (ref 0.5–1.9)
LACTIC ACID, VENOUS: 2.7 mmol/L — AB (ref 0.5–1.9)
Lactic Acid, Venous: 2.1 mmol/L (ref 0.5–1.9)

## 2017-02-08 LAB — TROPONIN I: Troponin I: 1.07 ng/mL (ref <0.03)

## 2017-02-08 MED ORDER — IOPAMIDOL (ISOVUE-300) INJECTION 61%
INTRAVENOUS | Status: AC
  Administered 2017-02-08: 100 mL
  Filled 2017-02-08: qty 100

## 2017-02-08 MED ORDER — PIPERACILLIN-TAZOBACTAM 3.375 G IVPB 30 MIN
3.3750 g | Freq: Once | INTRAVENOUS | Status: AC
  Administered 2017-02-08: 3.375 g via INTRAVENOUS
  Filled 2017-02-08: qty 50

## 2017-02-08 MED ORDER — PIPERACILLIN-TAZOBACTAM 3.375 G IVPB
3.3750 g | Freq: Three times a day (TID) | INTRAVENOUS | Status: DC
  Administered 2017-02-08 – 2017-02-11 (×10): 3.375 g via INTRAVENOUS
  Filled 2017-02-08 (×11): qty 50

## 2017-02-08 MED ORDER — IRBESARTAN 150 MG PO TABS
150.0000 mg | ORAL_TABLET | Freq: Every day | ORAL | Status: DC
  Filled 2017-02-08: qty 1

## 2017-02-08 MED ORDER — METRONIDAZOLE IN NACL 5-0.79 MG/ML-% IV SOLN
500.0000 mg | Freq: Three times a day (TID) | INTRAVENOUS | Status: DC
  Administered 2017-02-08 – 2017-02-12 (×12): 500 mg via INTRAVENOUS
  Filled 2017-02-08 (×13): qty 100

## 2017-02-08 MED ORDER — DILTIAZEM HCL 100 MG IV SOLR
5.0000 mg/h | INTRAVENOUS | Status: DC
  Administered 2017-02-08: 5 mg/h via INTRAVENOUS
  Administered 2017-02-08 – 2017-02-09 (×2): 7.5 mg/h via INTRAVENOUS
  Administered 2017-02-09: 15 mg/h via INTRAVENOUS
  Administered 2017-02-09: 10 mg/h via INTRAVENOUS
  Administered 2017-02-10 – 2017-02-11 (×5): 15 mg/h via INTRAVENOUS
  Filled 2017-02-08 (×10): qty 100

## 2017-02-08 MED ORDER — ALBUTEROL SULFATE (2.5 MG/3ML) 0.083% IN NEBU: 2.5000 mg | INHALATION_SOLUTION | RESPIRATORY_TRACT | Status: DC | PRN

## 2017-02-08 MED ORDER — SODIUM CHLORIDE 0.9 % IV BOLUS (SEPSIS)
1000.0000 mL | Freq: Once | INTRAVENOUS | Status: AC
  Administered 2017-02-08: 1000 mL via INTRAVENOUS

## 2017-02-08 MED ORDER — HYDROCODONE-ACETAMINOPHEN 5-325 MG PO TABS: 1.0000 | ORAL_TABLET | ORAL | Status: DC | PRN

## 2017-02-08 MED ORDER — ONDANSETRON HCL 4 MG PO TABS: 4.0000 mg | ORAL_TABLET | Freq: Four times a day (QID) | ORAL | Status: DC | PRN

## 2017-02-08 MED ORDER — ENOXAPARIN SODIUM 40 MG/0.4ML ~~LOC~~ SOLN
40.0000 mg | SUBCUTANEOUS | Status: DC
  Administered 2017-02-08: 40 mg via SUBCUTANEOUS
  Filled 2017-02-08: qty 0.4

## 2017-02-08 MED ORDER — BUDESONIDE 0.25 MG/2ML IN SUSP
0.2500 mg | Freq: Two times a day (BID) | RESPIRATORY_TRACT | Status: DC
  Administered 2017-02-08 – 2017-02-15 (×14): 0.25 mg via RESPIRATORY_TRACT
  Filled 2017-02-08 (×15): qty 2

## 2017-02-08 MED ORDER — VALSARTAN-HYDROCHLOROTHIAZIDE 160-12.5 MG PO TABS: 1.0000 | ORAL_TABLET | Freq: Every morning | ORAL | Status: DC

## 2017-02-08 MED ORDER — ACETAMINOPHEN 650 MG RE SUPP: 650.0000 mg | Freq: Four times a day (QID) | RECTAL | Status: DC | PRN

## 2017-02-08 MED ORDER — VANCOMYCIN HCL IN DEXTROSE 1-5 GM/200ML-% IV SOLN
1000.0000 mg | Freq: Once | INTRAVENOUS | Status: AC
  Administered 2017-02-08: 1000 mg via INTRAVENOUS
  Filled 2017-02-08: qty 200

## 2017-02-08 MED ORDER — POTASSIUM CHLORIDE CRYS ER 20 MEQ PO TBCR
40.0000 meq | EXTENDED_RELEASE_TABLET | Freq: Once | ORAL | Status: AC
  Administered 2017-02-08: 40 meq via ORAL
  Filled 2017-02-08: qty 2

## 2017-02-08 MED ORDER — SODIUM CHLORIDE 0.9 % IV SOLN
INTRAVENOUS | Status: DC
  Administered 2017-02-08 – 2017-02-11 (×7): via INTRAVENOUS

## 2017-02-08 MED ORDER — VANCOMYCIN HCL IN DEXTROSE 750-5 MG/150ML-% IV SOLN
750.0000 mg | Freq: Two times a day (BID) | INTRAVENOUS | Status: DC
  Administered 2017-02-08 – 2017-02-10 (×3): 750 mg via INTRAVENOUS
  Filled 2017-02-08 (×5): qty 150

## 2017-02-08 MED ORDER — SODIUM CHLORIDE 0.9 % IV SOLN
1.0000 g | Freq: Once | INTRAVENOUS | Status: AC
  Administered 2017-02-08: 1 g via INTRAVENOUS
  Filled 2017-02-08: qty 10

## 2017-02-08 MED ORDER — SODIUM CHLORIDE 0.9% FLUSH
3.0000 mL | Freq: Two times a day (BID) | INTRAVENOUS | Status: DC
  Administered 2017-02-08 – 2017-02-14 (×5): 3 mL via INTRAVENOUS

## 2017-02-08 MED ORDER — HYDROCHLOROTHIAZIDE 12.5 MG PO CAPS
12.5000 mg | ORAL_CAPSULE | Freq: Every day | ORAL | Status: DC
  Administered 2017-02-08: 12.5 mg via ORAL
  Filled 2017-02-08: qty 1

## 2017-02-08 MED ORDER — ACETAMINOPHEN 500 MG PO TABS
1000.0000 mg | ORAL_TABLET | Freq: Once | ORAL | Status: AC
  Administered 2017-02-08: 1000 mg via ORAL
  Filled 2017-02-08: qty 2

## 2017-02-08 MED ORDER — POTASSIUM CHLORIDE 10 MEQ/100ML IV SOLN
10.0000 meq | INTRAVENOUS | Status: AC
  Administered 2017-02-08 (×3): 10 meq via INTRAVENOUS
  Filled 2017-02-08 (×3): qty 100

## 2017-02-08 MED ORDER — SODIUM CHLORIDE 0.9 % IV BOLUS (SEPSIS)
500.0000 mL | Freq: Once | INTRAVENOUS | Status: AC
  Administered 2017-02-08: 500 mL via INTRAVENOUS

## 2017-02-08 MED ORDER — ONDANSETRON HCL 4 MG/2ML IJ SOLN
4.0000 mg | Freq: Four times a day (QID) | INTRAMUSCULAR | Status: DC | PRN
  Administered 2017-02-10: 4 mg via INTRAVENOUS
  Filled 2017-02-08: qty 2

## 2017-02-08 MED ORDER — HYPROMELLOSE (GONIOSCOPIC) 2.5 % OP SOLN
1.0000 [drp] | OPHTHALMIC | Status: DC | PRN
  Administered 2017-02-08 – 2017-02-09 (×2): 1 [drp] via OPHTHALMIC
  Filled 2017-02-08: qty 15

## 2017-02-08 MED ORDER — KETOROLAC TROMETHAMINE 15 MG/ML IJ SOLN
15.0000 mg | Freq: Three times a day (TID) | INTRAMUSCULAR | Status: DC | PRN
  Administered 2017-02-09: 15 mg via INTRAVENOUS
  Filled 2017-02-08: qty 1

## 2017-02-08 MED ORDER — ACETAMINOPHEN 325 MG PO TABS
650.0000 mg | ORAL_TABLET | Freq: Four times a day (QID) | ORAL | Status: DC | PRN
  Filled 2017-02-08: qty 2

## 2017-02-08 MED ORDER — FLUTICASONE PROPIONATE HFA 110 MCG/ACT IN AERO: 1.0000 | INHALATION_SPRAY | Freq: Two times a day (BID) | RESPIRATORY_TRACT | Status: DC

## 2017-02-08 NOTE — ED Notes (Signed)
Change in rhythm noted EKG obtained and given to EDP

## 2017-02-08 NOTE — ED Notes (Signed)
Patient transported to Ultrasound 

## 2017-02-08 NOTE — Consult Note (Signed)
Cardiology Consult    Patient ID: Daniel Yu MRN: [id number], DOB/AGE: 02-20-1932   Admit date: 02/08/2017 Date of Consult: 02/08/2017  Primary Physician: Lavone Orn, MD Primary Cardiologist: new - Dr. Marlou Porch Requesting Provider: Dr. Aggie Moats  Reason for Consult: Afib RVR  Patient Profile    Mr. Dubose has a PMH significant for asthma, HTN, and SBO (12/2015). He presented to [facility] with complaints of nausea and constipation. Code sepsis was called and he was found to be in Afib RVR.   Daniel Yu is a 83 y.o. male who is being seen today for the evaluation of Afib RVR at the request of Dr. Aggie Moats.    Past Medical History   Past Medical History:  Diagnosis Date  . Arthritis    right knee oa  . Asthma   . Carpal tunnel syndrome    BILATERAL  . Hypertension   . SBO (small bowel obstruction) ([place]) 12/2015    Past Surgical History:  Procedure Laterality Date  . COLONOSCOPY WITH PROPOFOL N/A 11/09/2014   Procedure: COLONOSCOPY WITH PROPOFOL;  Surgeon: Garlan Fair, MD;  Location: [facility];  Service: Endoscopy;  Laterality: N/A;  . HERNIA REPAIR     2013  . QUADRICEPS TENDON REPAIR Left 06/07/2015   Procedure: REPAIR LEFT QUADRICEP TENDON;  Surgeon: Gaynelle Arabian, MD;  Location: [facility] OR[facility];  Service: Orthopedics;  Laterality: Left;  . TOTAL KNEE ARTHROPLASTY Right 12/27/2015   Procedure: RIGHT TOTAL KNEE ARTHROPLASTY;  Surgeon: Gaynelle Arabian, MD;  Location: [facility] OR[facility];  Service: Orthopedics;  Laterality: Right;     Allergies  No Known Allergies  History of Present Illness    Mr. Kobler does not currently follow with cardiology. He presented to [facility] with c/o abdominal pain, nausea, constipation, and shortness of breath.  He was admitted to Medicine service and code sepsis was initiated for possible colitis. He was also found to be in Afib RVR. Diltiazem drip was started. He is now rate-controlled in the 80s. Heparin drip was started for anticoagulation. Cardiology was asked  to evaluate for Afib RVR before laparoscopic cholecystectomy scheduled for tomorrow. He is on vanc, zosyn, and flagyl for possible intra-abdominal infection.   On my interview, he is largely asymptomatic in rate-controlled Afib. Pressures are marginal. He denies chest pain, palpitations, and SOB. He reports 3 episodes of lightheadedness/dizziness and pre-syncope over the past month. One episode was associated with bending down, the other two episodes were not associated with an activity, such as standing from a sitting position.   Inpatient Medications    . budesonide (PULMICORT) nebulizer solution  0.25 mg Nebulization BID  . enoxaparin (LOVENOX) injection  40 mg Subcutaneous Q24H  . irbesartan  150 mg Oral Daily   Or  . hydrochlorothiazide  12.5 mg Oral Daily  . sodium chloride flush  3 mL Intravenous Q12H     Outpatient Medications    Prior to Admission medications   Medication Sig Start Date End Date Taking? Authorizing Provider  acetaminophen (TYLENOL) 500 MG tablet Take 1,000 mg by mouth every 6 (six) hours as needed for mild pain.   Yes [provider]  cetirizine (ZYRTEC) 10 MG tablet Take 10 mg by mouth daily.   Yes [provider]  chlorproMAZINE (THORAZINE) 10 MG tablet Take 1-2.5 tablets (10-25 mg total) by mouth 3 (three) times daily as needed for hiccoughs. 01/10/16  Yes Robbie Lis, MD  fluticasone (FLOVENT HFA) 110 MCG/ACT inhaler Inhale 1 puff into the lungs 2 (two)  times daily.    Yes [provider]  ondansetron (ZOFRAN) 4 MG tablet Take 1 tablet (4 mg total) by mouth every 6 (six) hours as needed for nausea. 01/10/16  Yes Robbie Lis, MD  valsartan-hydrochlorothiazide (DIOVAN-HCT) 160-12.5 MG per tablet Take 1 tablet by mouth every morning.   Yes [provider]     Family History    Family History  Problem Relation Age of Onset  . Hypertension Father     Social History    Social History   Social History  . Marital  status: Married    Spouse name: N/A  . Number of children: N/A  . Years of education: N/A   Occupational History  . Not on file.   Social History Main Topics  . Smoking status: Never Smoker  . Smokeless tobacco: Never Used  . Alcohol use Yes     Comment:  2 beers daily  . Drug use: No  . Sexual activity: Not on file   Other Topics Concern  . Not on file   Social History Narrative  . No narrative on file     Review of Systems    General:  No chills, fever, night sweats or weight changes.  Cardiovascular:  No chest pain, dyspnea on exertion, edema, orthopnea, palpitations, paroxysmal nocturnal dyspnea. Dermatological: No rash, lesions/masses Respiratory: No cough, dyspnea Urologic: No hematuria, dysuria Abdominal:   + nausea, no vomiting, diarrhea, bright red blood per rectum, melena, or hematemesis; + abdominal discomfort Neurologic:  No visual changes, changes in mental status. All other systems reviewed and are otherwise negative except as noted above.  Physical Exam    Blood pressure 100/73, pulse 98, temperature 98.4 F (36.9 C), resp. rate 13, height 5\' 10"  (1.778 m), weight 185 lb (83.9 kg), SpO2 98 %.  General: Pleasant, NAD Psych: Normal affect. Neuro: Alert and oriented X 3. Moves all extremities spontaneously. HEENT: Normal  Neck: Supple without bruits or JVD. Lungs:  Resp regular and unlabored, CTA. Heart: Irregular rhythm, regular rate,  no murmurs. Abdomen: Soft, tender, non-distended, BS + x 4.  Extremities: No clubbing, cyanosis or edema. DP/PT/Radials 2+ and equal bilaterally.  Labs    Troponin [facility] of Care Test)  Recent Labs  02/08/17 0424  TROPIPOC 0.03   No results for input(s): CKTOTAL, CKMB, TROPONINI in the last 72 hours. Lab Results  Component Value Date   WBC 14.7 (H) 02/08/2017   HGB 11.9 (L) 02/08/2017   HCT 35.1 (L) 02/08/2017   MCV 95.9 02/08/2017   PLT 164 02/08/2017     Recent Labs Lab 02/08/17 0516 02/08/17 0535  02/08/17 0805  NA 134* 136  --   K 3.0* 3.1*  --   CL 106 102  --   CO2 17*  --   --   BUN 15 16  --   CREATININE 1.01 0.80 0.99  CALCIUM 7.7*  --   --   PROT 5.6*  --   --   BILITOT 1.3*  --   --   ALKPHOS 51  --   --   ALT 14*  --   --   AST 28  --   --   GLUCOSE 104* 102*  --    No results found for: CHOL, HDL, LDLCALC, TRIG No results found for: [facility]   Radiology Studies    Ct Abdomen Pelvis W Contrast  Result Date: 02/08/2017 CLINICAL DATA:  Diffuse abdominal cramping.  Sepsis. EXAM: CT ABDOMEN AND PELVIS  WITH CONTRAST TECHNIQUE: Multidetector CT imaging of the abdomen and pelvis was performed using the standard protocol following bolus administration of intravenous contrast. CONTRAST:  137mL ISOVUE-300 IOPAMIDOL (ISOVUE-300) INJECTION 61% COMPARISON:  Radiographs earlier this day.  CT 01/03/2016 FINDINGS: Lower chest: Coronary artery calcifications. Lower lobe bronchial thickening. Dependent lower lobe densities, atelectasis on the left, indeterminate on the right. Hepatobiliary: Gallbladder is distended with gallbladder wall thickening and adjacent pericholecystic fluid. No calcified gallstones. There is no biliary dilatation. No focal hepatic lesion. Pancreas: No ductal dilatation or inflammation. Spleen: Normal in size without focal abnormality. Adrenals/Urinary Tract: Normal adrenal glands. No hydronephrosis. Small volume of right perinephric fluid may be sympathetic. Symmetric renal excretion on delayed phase imaging. Urinary bladder is nondistended. Stomach/Bowel: The stomach is decompressed. Fluid within upper abdominal and lower pelvic bowel lobes which are nondilated. Minimal liquid stool in the cecum and transverse colon. Formed stool in the more distal colon. No colonic inflammation. There is wall thickening of the cecum and ascending colon. The dilated small bowel loop in radiograph is not present. The appendix is not confidently visualized. Vascular/Lymphatic: Aortic and  branch atherosclerosis without aneurysm. No adenopathy. Reproductive: Prominent prostate gland. Other: Small volume of free fluid tracks in the pelvis. No free air. No intra-abdominal abscess. Musculoskeletal: Degenerative change throughout the spine and in both hips. There are no acute or suspicious osseous abnormalities. IMPRESSION: 1. Findings suspicious for acute cholecystitis. No calcified gallstones are seen, however there is gallbladder distention, wall thickening and pericholecystic fluid. 2. Colonic wall thickening with liquid stool in the ascending colon, may be reactive versus less likely primary colitis. Fluid-filled nondilated small bowel, mild ileus. 3. Minimal right perinephric fluid is likely reactive. No evidence pyelonephritis. 4. Dependent lower lobes densities in the lung bases, atelectasis on the left, indeterminate on the right for atelectasis versus pneumonia. 5. Aortic atherosclerosis. Electronically Signed   By: Jeb Levering M.D.   On: 02/08/2017 06:33   Dg Abd Acute W/chest  Result Date: 02/08/2017 CLINICAL DATA:  Abdominal cramping and lower abdominal pain. Hypotension. EXAM: DG ABDOMEN ACUTE W/ 1V CHEST COMPARISON:  Radiographs and CT May 2017 FINDINGS: The cardiomediastinal contours are normal. Atherosclerosis of the thoracic aorta. Mild bibasilar atelectasis. Mild elevation of right hemidiaphragm. There is no free intra-abdominal air. Dilated small bowel in the central abdomen measures up to 4.6 cm. Air within normal caliber transverse colon. Small volume of distal colonic stool. There is otherwise relative paucity of bowel gas. No radiopaque calculi. No acute osseous abnormalities are seen. Degenerative change throughout spine. IMPRESSION: Dilated central small bowel. Air within the colon. Findings may reflect ileus or enteritis, overall nonspecific bowel gas pattern. Mild bibasilar atelectasis.  Thoracic aortic atherosclerosis. Electronically Signed   By: Jeb Levering M.D.    On: 02/08/2017 04:57   US Abdomen Limited Ruq  Result Date: 02/08/2017 CLINICAL DATA:  83 year old male with right upper quadrant abdominal pain for 2 days. EXAM: ULTRASOUND ABDOMEN LIMITED RIGHT UPPER QUADRANT COMPARISON:  02/08/2017 CT FINDINGS: Gallbladder: A large amount of gallbladder sludge noted. Gallbladder wall thickening and pericholecystic fluid noted. No definite cholelithiasis or sonographic Murphy's sign. Common bile duct: Diameter: 3 mm. There is no evidence of intrahepatic or extrahepatic biliary dilatation. Liver: No focal lesion identified. Within normal limits in parenchymal echogenicity. A small amount of free fluid in Morison's pouch noted. IMPRESSION: Gallbladder wall thickening with pericholecystic fluid and large amount of gallbladder sludge suspicious for acute cholecystitis in conjunction with CT findings. No biliary dilatation. Electronically Signed  By: Margarette Canada M.D.   On: 02/08/2017 07:42    ECG & Cardiac Imaging    EKG 02/08/17: Afib with RVR, ventricular rate 152 bpm, repolarization abnormality  Echocardiogram 02/08/17: pending  Assessment & Plan    1. Atrial fibrillation RVR - Patient is on cardizem drip at 7.5 mg/hr and maintaining borderline BP in the 100s. He is rate controlled on this dose. Continue diltiazem drip while NPO. If hypotensive and not rate controlled, may consider IV amiodarone. - Echocardiogram pending for new onset Afib. Pt denies chest pain. If echocardiogram abnormal, may need further ischemic evaluation before surgery. Troponin today was 0.03, likely relate to demand ischemia in the setting of RVR. If echocardiogram normal, may proceed with surgery. - Pt has not been adequately anticoagulated. Would start heparin drip post-operatively when safe to do so from a surgical standpoint.  - labs pending, including TSH  This patients CHA2DS2-VASc Score and unadjusted Ischemic Stroke Rate (% per year) is equal to 3.2 % stroke rate/year from a  score of 3   2. Asthma -controlled   3. HTN - D/C'ed home valsartan-HCTZ while titrating rate-controlling medications; BP marginal   4. 3 episodes of dizziness/lightheadedness, pre-syncope - will recommend 30 day event monitor as outpatient - continue to monitor on telemetry   Signed, Ledora Bottcher, PA-C 02/08/2017, 4:27 PM [phone]  As above, patient seen and examined. Briefly he is an 83 year old male with past medical history of hypertension admitted with cholecystitis and atrial fibrillation who I am asked to evaluate preoperatively prior to cholecystectomy and for atrial fibrillation. Patient is very active and can walk 2-1/2 miles without significant dyspnea or chest pain. He does not have orthopnea, PND, pedal edema, palpitations. Over the past 3 days he developed lower abdominal pain and constipation. He was admitted and has been found to have possible colitis and cholecystitis. He will require cholecystectomy and we were asked to evaluate preoperatively. He was also noted to be in atrial fibrillation. Note he has had some dizzy spells over the past 1 month. These lasts seconds and occur after changing positions. They occur with going from sitting to standing and also turning his head. He has not had syncope. Electrocardiogram shows atrial fibrillation with nonspecific ST changes.  1 Newly diagnosed atrial fibrillation-the duration of his arrhythmia is unclear. We will continue with Cardizem for rate control. Check echocardiogram for LV function. Check TSH. Embolic risk factors include hypertension and age greater than 105. CHADSvasc 3. We will add apixaban once hemostasis is achieved following cholecystectomy. We will decide about cardioversion in the future based on symptoms.  2 preoperative evaluation prior to cholecystectomy-patient has excellent functional capacity with no chest pain or dyspnea. Await echocardiogram. If LV function normal may proceed without further  evaluation.  3 cholecystitis-for cholecystectomy. Management per surgery.  4 hypertension-blood pressure is controlled. Continue Cardizem.  Kirk Ruths, MD

## 2017-02-08 NOTE — H&P (Addendum)
[facility] History and Physical  Daniel Yu CVE:[id number] DOB: [birth year] DOA: 02/08/2017  Referring physician:  PCP: Lavone Orn, MD   Chief Complaint: "I tired to throw up and couldn't."  HPI: Daniel Yu is a 83 y.o. male  with past medical history significant for asthma and hypertension presents emergency room chief complaint of nausea and weakness. Patient states that over last 2 days he's had decreased appetite. Not able to tolerate liquids or solids without some degree nausea. This worsened over time. Patient also developed lower abdominal pain bilaterally. No trauma or sick contacts. Patient went to [facility] at lunch time and then began to have worsening abdominal discomfort. At home later pt went to the rest room and tried to vomit but couldn't. At one point he became weak and slid off the bed onto the floor. At this point his wife activated EMS.  Denies fever, chills, rash, altered mental status, chest pain. Positive for shortness of breath. Did not take his inhaler this morning.  ED course: Started on sepsis protocol. CT revealed inflamed bowel and gallbladder. Right upper quadrant ultrasound showed acute cholecystitis per general surgery. Hospitalists admitted patient.  Review of Systems:  As per HPI otherwise 10 point review of systems negative.    Past Medical History:  Diagnosis Date  . Arthritis    right knee oa  . Asthma   . Carpal tunnel syndrome    BILATERAL  . Hypertension   . SBO (small bowel obstruction) ([place]) 12/2015   Past Surgical History:  Procedure Laterality Date  . COLONOSCOPY WITH PROPOFOL N/A 11/09/2014   Procedure: COLONOSCOPY WITH PROPOFOL;  Surgeon: Garlan Fair, MD;  Location: [facility];  Service: Endoscopy;  Laterality: N/A;  . HERNIA REPAIR     2013  . QUADRICEPS TENDON REPAIR Left 06/07/2015   Procedure: REPAIR LEFT QUADRICEP TENDON;  Surgeon: Gaynelle Arabian, MD;  Location: [facility];  Service: Orthopedics;  Laterality:  Left;  . TOTAL KNEE ARTHROPLASTY Right 12/27/2015   Procedure: RIGHT TOTAL KNEE ARTHROPLASTY;  Surgeon: Gaynelle Arabian, MD;  Location: [facility];  Service: Orthopedics;  Laterality: Right;   Social History:  reports that he has never smoked. He has never used smokeless tobacco. He reports that he drinks alcohol. He reports that he does not use drugs.  No Known Allergies  History reviewed. No pertinent family history.   Prior to Admission medications   Medication Sig Start Date End Date Taking? Authorizing Provider  acetaminophen (TYLENOL) 500 MG tablet Take 1,000 mg by mouth every 6 (six) hours as needed for mild pain.   Yes [provider]  cetirizine (ZYRTEC) 10 MG tablet Take 10 mg by mouth daily.   Yes [provider]  chlorproMAZINE (THORAZINE) 10 MG tablet Take 1-2.5 tablets (10-25 mg total) by mouth 3 (three) times daily as needed for hiccoughs. 01/10/16  Yes Robbie Lis, MD  fluticasone (FLOVENT HFA) 110 MCG/ACT inhaler Inhale 1 puff into the lungs 2 (two) times daily.    Yes [provider]  ondansetron (ZOFRAN) 4 MG tablet Take 1 tablet (4 mg total) by mouth every 6 (six) hours as needed for nausea. 01/10/16  Yes Robbie Lis, MD  valsartan-hydrochlorothiazide (DIOVAN-HCT) 160-12.5 MG per tablet Take 1 tablet by mouth every morning.   Yes [provider]   Physical Exam: Vitals:   02/08/17 0258 02/08/17 0600 02/08/17 0630 02/08/17 0700  BP: 95/66 97/68 92/61  103/66  Pulse: (!) 139 (!) 120 (!) 134 (!) 131  Resp: (!) 27 (!) 30 (!) 31 (!) 31  Temp:      TempSrc:      SpO2: 96% 94% 94% 93%  Weight:      Height:        Wt Readings from Last 3 Encounters:  02/08/17 83.9 kg (185 lb)  01/10/16 97.5 kg (214 lb 15.2 oz)  12/27/15 90.7 kg (200 lb)    General:  Appears calm and comfortable; A&Ox3 Eyes:  PERRL, EOMI, normal lids, iris ENT:  grossly normal hearing, lips & tongue Neck:  no LAD, masses or thyromegaly Cardiovascular:  RRR, no m/r/g.  No LE edema.  Respiratory:  Slight faint wheeze, no r/r. Tacypnea Abdomen:  soft, diffuse abd tenderness, no rigidity or rebound, Skin:  no rash or induration seen on limited exam Musculoskeletal:  grossly normal tone BUE/BLE Psychiatric:  grossly normal mood and affect, speech fluent and appropriate Neurologic:  CN 2-12 grossly intact, moves all extremities in coordinated fashion.          Labs on Admission:  Basic Metabolic Panel:  Recent Labs Lab 02/08/17 0516 02/08/17 0535  NA 134* 136  K 3.0* 3.1*  CL 106 102  CO2 17*  --   GLUCOSE 104* 102*  BUN 15 16  CREATININE 1.01 0.80  CALCIUM 7.7*  --    Liver Function Tests:  Recent Labs Lab 02/08/17 0516  AST 28  ALT 14*  ALKPHOS 51  BILITOT 1.3*  PROT 5.6*  ALBUMIN 2.7*    Recent Labs Lab 02/08/17 0515  LIPASE 18   No results for input(s): AMMONIA in the last 168 hours. CBC:  Recent Labs Lab 02/08/17 0344 02/08/17 0535  WBC 9.6  --   NEUTROABS 9.2*  --   HGB 12.7* 11.6*  HCT 36.9* 34.0*  MCV 94.9  --   PLT 200  --    Cardiac Enzymes: No results for input(s): CKTOTAL, CKMB, CKMBINDEX, TROPONINI in the last 168 hours.  BNP (last 3 results) No results for input(s): BNP in the last 8760 hours.  ProBNP (last 3 results) No results for input(s): PROBNP in the last 8760 hours.   Serum creatinine: 0.8 mg/dL 02/08/17 0535 Estimated creatinine clearance: 73.5 mL/min  CBG: No results for input(s): GLUCAP in the last 168 hours.  Radiological Exams on Admission: Ct Abdomen Pelvis W Contrast  Result Date: 02/08/2017 CLINICAL DATA:  Diffuse abdominal cramping.  Sepsis. EXAM: CT ABDOMEN AND PELVIS WITH CONTRAST TECHNIQUE: Multidetector CT imaging of the abdomen and pelvis was performed using the standard protocol following bolus administration of intravenous contrast. CONTRAST:  143mL ISOVUE-300 IOPAMIDOL (ISOVUE-300) INJECTION 61% COMPARISON:  Radiographs earlier this day.  CT 01/03/2016 FINDINGS: Lower  chest: Coronary artery calcifications. Lower lobe bronchial thickening. Dependent lower lobe densities, atelectasis on the left, indeterminate on the right. Hepatobiliary: Gallbladder is distended with gallbladder wall thickening and adjacent pericholecystic fluid. No calcified gallstones. There is no biliary dilatation. No focal hepatic lesion. Pancreas: No ductal dilatation or inflammation. Spleen: Normal in size without focal abnormality. Adrenals/Urinary Tract: Normal adrenal glands. No hydronephrosis. Small volume of right perinephric fluid may be sympathetic. Symmetric renal excretion on delayed phase imaging. Urinary bladder is nondistended. Stomach/Bowel: The stomach is decompressed. Fluid within upper abdominal and lower pelvic bowel lobes which are nondilated. Minimal liquid stool in the cecum and transverse colon. Formed stool in the more distal colon. No colonic inflammation. There is wall thickening of the cecum and ascending colon. The dilated small bowel loop in radiograph is  not present. The appendix is not confidently visualized. Vascular/Lymphatic: Aortic and branch atherosclerosis without aneurysm. No adenopathy. Reproductive: Prominent prostate gland. Other: Small volume of free fluid tracks in the pelvis. No free air. No intra-abdominal abscess. Musculoskeletal: Degenerative change throughout the spine and in both hips. There are no acute or suspicious osseous abnormalities. IMPRESSION: 1. Findings suspicious for acute cholecystitis. No calcified gallstones are seen, however there is gallbladder distention, wall thickening and pericholecystic fluid. 2. Colonic wall thickening with liquid stool in the ascending colon, may be reactive versus less likely primary colitis. Fluid-filled nondilated small bowel, mild ileus. 3. Minimal right perinephric fluid is likely reactive. No evidence pyelonephritis. 4. Dependent lower lobes densities in the lung bases, atelectasis on the left, indeterminate on the  right for atelectasis versus pneumonia. 5. Aortic atherosclerosis. Electronically Signed   By: Jeb Levering M.D.   On: 02/08/2017 06:33   Dg Abd Acute W/chest  Result Date: 02/08/2017 CLINICAL DATA:  Abdominal cramping and lower abdominal pain. Hypotension. EXAM: DG ABDOMEN ACUTE W/ 1V CHEST COMPARISON:  Radiographs and CT May 2017 FINDINGS: The cardiomediastinal contours are normal. Atherosclerosis of the thoracic aorta. Mild bibasilar atelectasis. Mild elevation of right hemidiaphragm. There is no free intra-abdominal air. Dilated small bowel in the central abdomen measures up to 4.6 cm. Air within normal caliber transverse colon. Small volume of distal colonic stool. There is otherwise relative paucity of bowel gas. No radiopaque calculi. No acute osseous abnormalities are seen. Degenerative change throughout spine. IMPRESSION: Dilated central small bowel. Air within the colon. Findings may reflect ileus or enteritis, overall nonspecific bowel gas pattern. Mild bibasilar atelectasis.  Thoracic aortic atherosclerosis. Electronically Signed   By: Jeb Levering M.D.   On: 02/08/2017 04:57    EKG: Independently reviewed. Afib with RVR, recheck ordered  Assessment/Plan Principal Problem:   Sepsis ([place]) Active Problems:   HTN (hypertension)   Asthma   Arthritis   Abnormal abdominal CT scan   Sepsis 2/2 abdominal process Patient hemodynamically stable Given vanc emergency room, will continue Given zosyn in the emergency room, will continue Adding flagyl Urine culture pending, UA clean Blood cultures 2 pending Patient given 3000 mL of fluid in the emergency room Lactic acid 3.94-> 3.25-> pending Lipase normal  Atrial fibrillation Initial troponin negative Repeat ordered Correcting electrolytes Echo ordered Cardiology consult ordered for preoperative clearance Patient started on diltiazem drip which was effective control heart rate, and no bolus given due to soft blood  pressure  Abnormal abdominal CT scan--> acute cholecystitis Right upper quadrant ultrasound ordered and pending General surgery will formally consult, call by EDP  Low potassium and calcium Corrected calcium for albumin 8.7 73meq of K ordered by EDP Replace IV Recheck Mag pending Checking Phos  Asthma When necessary albuterol Cont flovent Slight wheeze today, will check CXR in AM Low suspicion of asthma exacerbation, will hold off on steroids  Hypertension When necessary hydralazine 10 mg IV as needed for severe blood pressure Cont diovan HCT  Arthritis Prn tylenol  Anemia Appears stable, will monitor  Allergies Hold zyrtec  Hiccups Hold thorazine prn   Code Status: Full code DVT Prophylaxis: Lovenox (apporved by surg) Family Communication: wife at bedside Disposition Plan: Pending Improvement  Status: Telemetry, inpatient  Elwin Mocha, MD Family Medicine [facility] [email or web address] Password [email or web address]

## 2017-02-08 NOTE — ED Notes (Signed)
Paged Gen surgery

## 2017-02-08 NOTE — Plan of Care (Signed)
Problem: Education: Goal: Knowledge of [facility] Education information/materials will improve Outcome: Progressing Given education on what current plan is and what is to happen overnight

## 2017-02-08 NOTE — ED Notes (Signed)
Date and time results received: 02/08/17 12:35 PM   Test: Lactic Acid Critical Value: 2.6  Name of Provider Notified: Hobs MD

## 2017-02-08 NOTE — ED Provider Notes (Signed)
TIME SEEN: 3:27 AM   By signing my name below, I, [name], attest that this documentation has been prepared under the direction and in the presence of [name], Delice Bison, DO. Electronically Signed: [name], Scribe. 02/08/17. 3:37 AM.  CHIEF COMPLAINT: Constipation   HPI:  Daniel Yu is a 83 y.o. male with a history of asthma, hypertension, and ileus, who presents to the Emergency Department complaining of gradual-onset, constant constipation that began two days ago. Patient states ever since he had tacos and coffee two days ago he has been unable to eat anything. Patient has not had any bowel movements in two days. Patient reports associated abdominal cramps and nausea. No modifying factors indicated. He states that he feels his abdominal pain has improved and he is no longer having cramping. Patient is scheduled for an appointment this evening with his primary physician. Patient reports an abdominal surgical history of a hernia removal. Patient denies any fever, chills, vomiting, diarrhea, chest pain, shortness of breath, cough, diaphoresis, or any additional symptoms.   Patient is also complaining of an unsteady gait that began several weeks ago after he was told he had an episode of vertigo. Patient states he has not been able to ambulate regularly. He states he has lost his balance several times in the past few days. He did not fall. Patient reports associated lightheadedness. No symptoms of room spinning or the room moving. He denies any headache, head injury. No numbness, tingling or focal weakness. No modifying factors indicated. Patient denies any additional symptoms.    ROS: See HPI Constitutional: no fever  Eyes: no drainage  ENT: no runny nose   Cardiovascular:  no chest pain  Resp: no SOB  GI: no vomiting GU: no dysuria Integumentary: no rash  Allergy: no hives  Musculoskeletal: no leg swelling  Neurological: no slurred speech ROS otherwise negative  PAST MEDICAL  HISTORY/PAST SURGICAL HISTORY:  Past Medical History:  Diagnosis Date  . Arthritis    right knee oa  . Asthma   . Carpal tunnel syndrome    BILATERAL  . Hypertension   . SBO (small bowel obstruction) ([place]) 12/2015    MEDICATIONS:  Prior to Admission medications   Medication Sig Start Date End Date Taking? Authorizing Provider  acetaminophen (TYLENOL) 500 MG tablet Take 1,000 mg by mouth every 6 (six) hours as needed for mild pain.    [provider]  chlorproMAZINE (THORAZINE) 10 MG tablet Take 1-2.5 tablets (10-25 mg total) by mouth 3 (three) times daily as needed for hiccoughs. 01/10/16   Robbie Lis, MD  fluticasone (FLOVENT HFA) 110 MCG/ACT inhaler Inhale 2 puffs into the lungs 2 (two) times daily.    [provider]  ondansetron (ZOFRAN) 4 MG tablet Take 1 tablet (4 mg total) by mouth every 6 (six) hours as needed for nausea. 01/10/16   Robbie Lis, MD  rivaroxaban (XARELTO) 10 MG TABS tablet Take 1 tablet (10 mg total) by mouth daily with breakfast. Take Xarelto for two and a half more weeks, then discontinue Xarelto. Once the patient has completed the blood thinner regimen, then take a Baby 81 mg Aspirin daily for three more weeks. 12/28/15   Perkins, Alexzandrew L, PA-C  valsartan-hydrochlorothiazide (DIOVAN-HCT) 160-12.5 MG per tablet Take 1 tablet by mouth every morning.    [provider]    ALLERGIES:  No Known Allergies  SOCIAL HISTORY:  Social History  Substance Use Topics  . Smoking status: Never Smoker  .  Smokeless tobacco: Never Used  . Alcohol use Yes     Comment:  2 beers daily    FAMILY HISTORY: History reviewed. No pertinent family history.  EXAM: BP 113/62 (BP Location: Right Arm)   Pulse (!) 106   Temp 99.8 F (37.7 C) (Oral)   Resp 16   SpO2 96%  CONSTITUTIONAL: Alert and oriented and responds appropriately to questions. Well-appearing; well-nourished, Elderly but appears younger than stated age and in no  distress HEAD: Normocephalic EYES: Conjunctivae clear, pupils appear equal, EOMI ENT: normal nose; moist mucous membranes NECK: Supple, no meningismus, no nuchal rigidity, no LAD  CARD: Regular and tachycardic; S1 and S2 appreciated; no murmurs, no clicks, no rubs, no gallops RESP: Normal chest excursion without splinting or tachypnea; breath sounds clear and equal bilaterally; no wheezes, no rhonchi, no rales, no hypoxia or respiratory distress, speaking full sentences ABD/GI: Normal bowel sounds; non-distended; soft, non-tender, no rebound, no guarding, no peritoneal signs, no hepatosplenomegaly BACK:  The back appears normal and is non-tender to palpation, there is no CVA tenderness EXT: Normal ROM in all joints; non-tender to palpation; no edema; normal capillary refill; no cyanosis, no calf tenderness or swelling    SKIN: Normal color for age and race; warm; no rash NEURO: Moves all extremities equally; Strength 5/5 in all four extremities.  Normal sensation diffusely.  CN 2-12 grossly intact.  No dysmetria to finger to nose testing bilaterally.  Normal speech.  Normal gait. PSYCH: The patient's mood and manner are appropriate. Grooming and personal hygiene are appropriate.  MEDICAL DECISION MAKING: Patient here with complaints of not having a bowel movement for the past 2 days, feeling weak, nauseous. He also reports having difficulty walking feeling like he doesn't have his balance the past several weeks. On exam, patient is tachycardic and hypotensive. Rectal exam reveals that he is febrile. Septic workup initiated. We'll give 30 mL/kg IV fluid bolus in broad-spectrum antibiotics.  ED PROGRESS: Patient's workup reveals elevated lactate. Potassium slightly low at 3.0 which we have given him oral replacement 4. His bicarbonate is low but I suspect this is secondary to a lactic acidosis. Total bilirubin mildly elevated but otherwise his lipase and LFTs normal. Acute abdominal series shows  possible ileus versus enteritis but no pneumonia. Urine, CT of abdomen and pelvis pending. Blood cultures have been sent. Patient is now in atrial fibrillation with RVR. He does not have a history of this. I do not feel it is appropriate to give him rate controlling medications at this time given he is septic.   Urine shows no obvious infection. He does have few bacteria and culture is pending. Lactate is improving with IV hydration. Blood pressure has improved with IV fluids as well. CT of the abdomen and pelvis shows possible right lower lobe pneumonia versus atelectasis. Also possible colitis versus reactive colonic wall thickening. Concern for acute acalculous cholecystitis. We'll discuss with general surgery. He has received IV vancomycin and Zosyn.  6:50 AM  D/w Dr. Donne Hazel with general surgery. They will see the patient in consult. He agrees with obtaining a formal right upper quadrant ultrasound given concerns for acute cholecystitis seen on CT scan. We'll discuss with medicine for admission given patient has septic shock, new onset atrial fibrillation with RVR, possible pneumonia and colitis.   7:18 AM Discussed patient's case with hospitalist, Dr. Aggie Moats.  I have recommended admission and patient (and family if present) agree with this plan. Admitting physician will place admission orders.   I  reviewed all nursing notes, vitals, pertinent previous records, EKGs, lab and urine results, imaging (as available).     EKG Interpretation  Date/Time:  Thursday February 08 2017 04:00:50 EDT Ventricular Rate:  110 PR Interval:    QRS Duration: 87 QT Interval:  342 QTC Calculation: 463 R Axis:   70 Text Interpretation:  Sinus tachycardia Atrial premature complex Borderline T wave abnormalities Confirmed by Pryor Curia [phone]) on 02/08/2017 4:06:45 AM       EKG Interpretation  Date/Time:  Thursday February 08 2017 05:30:20 EDT Ventricular Rate:  143 PR Interval:    QRS Duration: 96 QT  Interval:  265 QTC Calculation: 409 R Axis:   75 Text Interpretation:  Atrial fribrillation with RVR Repolarization abnormality, prob rate related Confirmed by Pryor Curia [phone]) on 02/08/2017 5:55:51 AM        EKG Interpretation  Date/Time:  Thursday February 08 2017 05:34:48 EDT Ventricular Rate:  152 PR Interval:    QRS Duration: 86 QT Interval:  286 QTC Calculation: 455 R Axis:   74 Text Interpretation:  Atrial fibrillation with rapid V-rate Repolarization abnormality, prob rate related Confirmed by Pryor Curia [phone]) on 02/08/2017 5:56:25 AM       CRITICAL CARE Performed by: Nyra Jabs   Total critical care time: 75 minutes  Critical care time was exclusive of separately billable procedures and treating other patients.  Critical care was necessary to treat or prevent imminent or life-threatening deterioration.  Critical care was time spent personally by me on the following activities: development of treatment plan with patient and/or surrogate as well as nursing, discussions with consultants, evaluation of patient's response to treatment, examination of patient, obtaining history from patient or surrogate, ordering and performing treatments and interventions, ordering and review of laboratory studies, ordering and review of radiographic studies, pulse oximetry and re-evaluation of patient's condition.    I personally performed the services described in this documentation, which was scribed in my presence. The recorded information has been reviewed and is accurate.     Georgeana Oertel, Delice Bison, DO 02/08/17 [phone]

## 2017-02-08 NOTE — ED Notes (Signed)
Nurse notified about Sepsis

## 2017-02-08 NOTE — Progress Notes (Signed)
Pharmacy Antibiotic Note  Daniel Yu is a 83 y.o. male admitted on 02/08/2017 with possible intra-abdominal infection. Pharmacy has been consulted for vancomycin and zosyn dosing. Temp 102.8, WBC 14.7, and LA 3.94. SCr 1 for estimated CrCl ~ 55-60 mL/min. Patient is also on Flagyl per MD - aware patient is receiving double anaerobe coverage.   Plan: Vancomycin 1g IV x1, then 750 mg IV q12hr Zosyn 3.375 g IV q8hr Flagyl 500 mg IV q8hr per MD  Vancomycin trough at [facility] and as needed (goal 15-20 mcg/mL) Monitor renal function, clinical picture, and culture data F/u length of therapy and de-escalation   Height: 5\' 10"  (177.8 cm) Weight: 185 lb (83.9 kg) IBW/kg (Calculated) : 73  Temp (24hrs), Avg:101.3 F (38.5 C), Min:99.8 F (37.7 C), Max:102.8 F (39.3 C)   Recent Labs Lab 02/08/17 0344 02/08/17 0426 02/08/17 0516 02/08/17 0535 02/08/17 0647 02/08/17 0805  WBC 9.6  --   --   --   --  14.7*  CREATININE  --   --  1.01 0.80  --  0.99  LATICACIDVEN  --  3.94*  --   --  3.25* 2.6*    Estimated Creatinine Clearance: 59.4 mL/min (by C-G formula based on SCr of 0.99 mg/dL).    No Known Allergies  Antimicrobials this admission: 6/21 Vanc >>  6/21 Zosyn >>  6/21 Flagyl >>   Dose adjustments this admission: n/a  Microbiology results: pending   Argie Ramming, PharmD Pharmacy Resident  Pager [phone] 02/08/17 11:43 AM

## 2017-02-08 NOTE — ED Notes (Signed)
Date and time results received: 02/08/17 12:34 PM    Test: Lactic Acid Critical Value: 2.1  Name of Provider Notified: Hobs MD  Orders Received

## 2017-02-08 NOTE — Progress Notes (Signed)
Notified Ignacia Bayley NP of trop 1.07. Cont to monitor. Carroll Kinds RN

## 2017-02-08 NOTE — Consult Note (Signed)
[facility] Surgery Consult/Admission Note  [facility] 29-Jun-1932  [id number].    Requesting MD: Dr. Leonides Schanz Chief Complaint/Reason for Consult: possible cholecystitis  HPI:   Pt is a 83 year old male with a history of hernia repair 2013, asthma, HTN, SBO, who presented to the [facility] ED with complaints of abdominal pain and nausea for 2 days. Patient states he ate Janine Limbo 2 days ago and roughly 2 hours afterwards he began to experience lower abdominal pain and nausea. The pain waxed and waned in severity, was nonradiating, nothing made it better, was crampy in sensation. Patient states he had a difficult time having a bowel movement the last 2 days. He normally has a bowel movement every day. No vomiting. Last night patient experienced shortness of breath which prompted his wife to call EMS. Patient is a last month he has been experiencing episodes of dizziness and lightheadedness. These episodes do not appear to be associated with anything pain quickly resolved. Patient describes one of the episodes as vertigo. He states his balance has been off over the last month. Patient had previous inguinal hernia repair, unsure which side. No other abdominal surgeries. Last meal was yesterday. Wife at bedside.  ED course: Labs: WBC 14.7, lactic acid 3.25, potassium 3.1, T bili 1.3, Hg 11.9 Vitals: Tachycardia with episodes of A. fib, febrile Tmax 102.8 CT abdomen pelvis: Gallbladder distention, wall thickening and pericholecystic fluid. Colonic wall thickening with liquid stool in the ascending colon possible colitis. Mild ileus. Possible atelectasis versus pneumonia. US abdomen: Gallbladder wall thickening with pericholecystic fluid and large amount of gallbladder sludge  ROS:  Review of Systems  Constitutional: Positive for fever. Negative for chills and diaphoresis.  HENT: Negative for sore throat.   Respiratory: Positive for shortness of breath. Negative for cough.   Cardiovascular: Negative for  chest pain.  Gastrointestinal: Positive for abdominal pain, constipation and nausea. Negative for diarrhea and vomiting.  Neurological: Positive for dizziness. Negative for focal weakness and loss of consciousness.  All other systems reviewed and are negative.    History reviewed. No pertinent family history.  Past Medical History:  Diagnosis Date  . Arthritis    right knee oa  . Asthma   . Carpal tunnel syndrome    BILATERAL  . Hypertension   . SBO (small bowel obstruction) ([place]) 12/2015    Past Surgical History:  Procedure Laterality Date  . COLONOSCOPY WITH PROPOFOL N/A 11/09/2014   Procedure: COLONOSCOPY WITH PROPOFOL;  Surgeon: Garlan Fair, MD;  Location: [facility] ENDOSCOPY;  Service: Endoscopy;  Laterality: N/A;  . HERNIA REPAIR     2013  . QUADRICEPS TENDON REPAIR Left 06/07/2015   Procedure: REPAIR LEFT QUADRICEP TENDON;  Surgeon: Gaynelle Arabian, MD;  Location: [facility] ORS;  Service: Orthopedics;  Laterality: Left;  . TOTAL KNEE ARTHROPLASTY Right 12/27/2015   Procedure: RIGHT TOTAL KNEE ARTHROPLASTY;  Surgeon: Gaynelle Arabian, MD;  Location: [facility] ORS;  Service: Orthopedics;  Laterality: Right;    Social History:  reports that he has never smoked. He has never used smokeless tobacco. He reports that he drinks alcohol. He reports that he does not use drugs.  Allergies: No Known Allergies   (Not in a hospital admission)  Blood pressure 108/68, pulse (!) 105, temperature (!) 102.8 F (39.3 C), temperature source Rectal, resp. rate (!) 28, height '5\' 10"'$  (1.778 m), weight 185 lb (83.9 kg), SpO2 94 %.  Physical Exam  Constitutional: He is oriented to person, place, and time and well-developed, well-nourished,  and in no distress. No distress.  Pleasant, well-appearing  HENT:  Head: Normocephalic and atraumatic.  Right Ear: External ear normal.  Left Ear: External ear normal.  Nose: Nose normal.  Mouth/Throat: Oropharynx is clear and moist. No oropharyngeal exudate.  Eyes:  Conjunctivae are normal. Pupils are equal, round, and reactive to light. Right eye exhibits no discharge. Left eye exhibits no discharge. No scleral icterus.  Neck: Normal range of motion. Neck supple. No tracheal deviation present. No thyromegaly present.  Cardiovascular: S1 normal and S2 normal.  An irregular rhythm present. Tachycardia present.   No murmur heard. Pulses:      Radial pulses are 2+ on the right side, and 2+ on the left side.  Pulmonary/Chest: Effort normal. No accessory muscle usage. No respiratory distress. He has no decreased breath sounds. He has wheezes (Scattered respiratory wheezes).  Abdominal: Soft. Normal appearance and bowel sounds are normal. He exhibits no distension. There is tenderness in the right upper quadrant. There is no rigidity and no guarding. No hernia.  Musculoskeletal: Normal range of motion. He exhibits no edema or deformity.  Lymphadenopathy:    He has no cervical adenopathy.  Neurological: He is alert and oriented to person, place, and time. No cranial nerve deficit (Grossly normal).  Skin: Skin is warm. No rash noted. He is diaphoretic.  Psychiatric: Mood and affect normal.  Nursing note and vitals reviewed.   Results for orders placed or performed during the hospital encounter of 02/08/17 (from the past 48 hour(s))  CBC WITH DIFFERENTIAL     Status: Abnormal   Collection Time: 02/08/17  3:44 AM  Result Value Ref Range   WBC 9.6 4.0 - 10.5 K/uL   RBC 3.89 (L) 4.22 - 5.81 MIL/uL   Hemoglobin 12.7 (L) 13.0 - 17.0 g/dL   HCT 36.9 (L) 39.0 - 52.0 %   MCV 94.9 78.0 - 100.0 fL   MCH 32.6 26.0 - 34.0 pg   MCHC 34.4 30.0 - 36.0 g/dL   RDW 13.1 11.5 - 15.5 %   Platelets 200 150 - 400 K/uL   Neutrophils Relative % 95 %   Neutro Abs 9.2 (H) 1.7 - 7.7 K/uL   Lymphocytes Relative 3 %   Lymphs Abs 0.3 (L) 0.7 - 4.0 K/uL   Monocytes Relative 2 %   Monocytes Absolute 0.2 0.1 - 1.0 K/uL   Eosinophils Relative 0 %   Eosinophils Absolute 0.0 0.0 - 0.7  K/uL   Basophils Relative 0 %   Basophils Absolute 0.0 0.0 - 0.1 K/uL  I-stat troponin, ED (not at [facility], [facility])     Status: None   Collection Time: 02/08/17  4:24 AM  Result Value Ref Range   Troponin i, poc 0.03 0.00 - 0.08 ng/mL   Comment 3            Comment: Due to the release kinetics of cTnI, a negative result within the first hours of the onset of symptoms does not rule out myocardial infarction with certainty. If myocardial infarction is still suspected, repeat the test at appropriate intervals.   I-Stat CG4 Lactic Acid, ED  (not at  [facility])     Status: Abnormal   Collection Time: 02/08/17  4:26 AM  Result Value Ref Range   Lactic Acid, Venous 3.94 (HH) 0.5 - 1.9 mmol/L   Comment NOTIFIED PHYSICIAN   POC occult blood, ED     Status: None   Collection Time: 02/08/17  4:38 AM  Result Value Ref Range  Fecal Occult Bld NEGATIVE NEGATIVE  Lipase, blood     Status: None   Collection Time: 02/08/17  5:15 AM  Result Value Ref Range   Lipase 18 11 - 51 U/L  Comprehensive metabolic panel     Status: Abnormal   Collection Time: 02/08/17  5:16 AM  Result Value Ref Range   Sodium 134 (L) 135 - 145 mmol/L   Potassium 3.0 (L) 3.5 - 5.1 mmol/L   Chloride 106 101 - 111 mmol/L   CO2 17 (L) 22 - 32 mmol/L   Glucose, Bld 104 (H) 65 - 99 mg/dL   BUN 15 6 - 20 mg/dL   Creatinine, Ser 1.01 0.61 - 1.24 mg/dL   Calcium 7.7 (L) 8.9 - 10.3 mg/dL   Total Protein 5.6 (L) 6.5 - 8.1 g/dL   Albumin 2.7 (L) 3.5 - 5.0 g/dL   AST 28 15 - 41 U/L   ALT 14 (L) 17 - 63 U/L   Alkaline Phosphatase 51 38 - 126 U/L   Total Bilirubin 1.3 (H) 0.3 - 1.2 mg/dL   GFR calc non Af Amer >60 >60 mL/min   GFR calc Af Amer >60 >60 mL/min    Comment: (NOTE) The eGFR has been calculated using the CKD EPI equation. This calculation has not been validated in all clinical situations. eGFR's persistently <60 mL/min signify possible Chronic Kidney Disease.    Anion gap 11 5 - 15  Urinalysis, Routine w reflex  microscopic     Status: Abnormal   Collection Time: 02/08/17  5:23 AM  Result Value Ref Range   Color, Urine AMBER (A) YELLOW    Comment: BIOCHEMICALS MAY BE AFFECTED BY COLOR   APPearance CLEAR CLEAR   Specific Gravity, Urine 1.028 1.005 - 1.030   pH 5.0 5.0 - 8.0   Glucose, UA NEGATIVE NEGATIVE mg/dL   Hgb urine dipstick SMALL (A) NEGATIVE   Bilirubin Urine NEGATIVE NEGATIVE   Ketones, ur NEGATIVE NEGATIVE mg/dL   Protein, ur 30 (A) NEGATIVE mg/dL   Nitrite NEGATIVE NEGATIVE   Leukocytes, UA NEGATIVE NEGATIVE   RBC / HPF 0-5 0 - 5 RBC/hpf   WBC, UA 0-5 0 - 5 WBC/hpf   Bacteria, UA FEW (A) NONE SEEN   Squamous Epithelial / LPF NONE SEEN NONE SEEN   Mucous PRESENT    Hyaline Casts, UA PRESENT    Granular Casts, UA PRESENT   I-stat chem 8, ed     Status: Abnormal   Collection Time: 02/08/17  5:35 AM  Result Value Ref Range   Sodium 136 135 - 145 mmol/L   Potassium 3.1 (L) 3.5 - 5.1 mmol/L   Chloride 102 101 - 111 mmol/L   BUN 16 6 - 20 mg/dL   Creatinine, Ser 0.80 0.61 - 1.24 mg/dL   Glucose, Bld 102 (H) 65 - 99 mg/dL   Calcium, Ion 1.04 (L) 1.15 - 1.40 mmol/L   TCO2 18 0 - 100 mmol/L   Hemoglobin 11.6 (L) 13.0 - 17.0 g/dL   HCT 34.0 (L) 39.0 - 52.0 %  I-Stat CG4 Lactic Acid, ED  (not at  [facility])     Status: Abnormal   Collection Time: 02/08/17  6:47 AM  Result Value Ref Range   Lactic Acid, Venous 3.25 (HH) 0.5 - 1.9 mmol/L   Comment NOTIFIED PHYSICIAN    Ct Abdomen Pelvis W Contrast  Result Date: 02/08/2017 CLINICAL DATA:  Diffuse abdominal cramping.  Sepsis. EXAM: CT ABDOMEN AND PELVIS WITH CONTRAST TECHNIQUE: Multidetector  CT imaging of the abdomen and pelvis was performed using the standard protocol following bolus administration of intravenous contrast. CONTRAST:  141m ISOVUE-300 IOPAMIDOL (ISOVUE-300) INJECTION 61% COMPARISON:  Radiographs earlier this day.  CT 01/03/2016 FINDINGS: Lower chest: Coronary artery calcifications. Lower lobe bronchial thickening. Dependent  lower lobe densities, atelectasis on the left, indeterminate on the right. Hepatobiliary: Gallbladder is distended with gallbladder wall thickening and adjacent pericholecystic fluid. No calcified gallstones. There is no biliary dilatation. No focal hepatic lesion. Pancreas: No ductal dilatation or inflammation. Spleen: Normal in size without focal abnormality. Adrenals/Urinary Tract: Normal adrenal glands. No hydronephrosis. Small volume of right perinephric fluid may be sympathetic. Symmetric renal excretion on delayed phase imaging. Urinary bladder is nondistended. Stomach/Bowel: The stomach is decompressed. Fluid within upper abdominal and lower pelvic bowel lobes which are nondilated. Minimal liquid stool in the cecum and transverse colon. Formed stool in the more distal colon. No colonic inflammation. There is wall thickening of the cecum and ascending colon. The dilated small bowel loop in radiograph is not present. The appendix is not confidently visualized. Vascular/Lymphatic: Aortic and branch atherosclerosis without aneurysm. No adenopathy. Reproductive: Prominent prostate gland. Other: Small volume of free fluid tracks in the pelvis. No free air. No intra-abdominal abscess. Musculoskeletal: Degenerative change throughout the spine and in both hips. There are no acute or suspicious osseous abnormalities. IMPRESSION: 1. Findings suspicious for acute cholecystitis. No calcified gallstones are seen, however there is gallbladder distention, wall thickening and pericholecystic fluid. 2. Colonic wall thickening with liquid stool in the ascending colon, may be reactive versus less likely primary colitis. Fluid-filled nondilated small bowel, mild ileus. 3. Minimal right perinephric fluid is likely reactive. No evidence pyelonephritis. 4. Dependent lower lobes densities in the lung bases, atelectasis on the left, indeterminate on the right for atelectasis versus pneumonia. 5. Aortic atherosclerosis. Electronically  Signed   By: MJeb LeveringM.D.   On: 02/08/2017 06:33   Dg Abd Acute W/chest  Result Date: 02/08/2017 CLINICAL DATA:  Abdominal cramping and lower abdominal pain. Hypotension. EXAM: DG ABDOMEN ACUTE W/ 1V CHEST COMPARISON:  Radiographs and CT May 2017 FINDINGS: The cardiomediastinal contours are normal. Atherosclerosis of the thoracic aorta. Mild bibasilar atelectasis. Mild elevation of right hemidiaphragm. There is no free intra-abdominal air. Dilated small bowel in the central abdomen measures up to 4.6 cm. Air within normal caliber transverse colon. Small volume of distal colonic stool. There is otherwise relative paucity of bowel gas. No radiopaque calculi. No acute osseous abnormalities are seen. Degenerative change throughout spine. IMPRESSION: Dilated central small bowel. Air within the colon. Findings may reflect ileus or enteritis, overall nonspecific bowel gas pattern. Mild bibasilar atelectasis.  Thoracic aortic atherosclerosis. Electronically Signed   By: MJeb LeveringM.D.   On: 02/08/2017 04:57      Assessment/Plan  Hx of asthma Hx HTN  Hx SBO HX of quad tendon rupture Hx of hernia repair 2013  Sepsis Possible colitis New A. fib cholecystitis - [place] chole[place]stit[place] - admit to medicine - Will likely take patient to the OR today or tomorrow for laparoscopic cholecystectomy - May need cardiac clearance due to new onset A. fib prior to surgery - Will discuss timing with Dr. TGeorgette Dover Thank you for the consult.  JKalman Drape PTulane - [facility] 02/08/2017, 7:33 AM Pager: [phone]: [phone]-Fri 7:00 am-4:30 pm Sat-Sun 7:00 am-11:30 am

## 2017-02-08 NOTE — ED Triage Notes (Signed)
Ems pt from home reports  Days of constipation and generalized weakness, unable to keep food down only water, reports to have slid out of chair today .

## 2017-02-09 ENCOUNTER — Encounter (HOSPITAL_COMMUNITY)
Admission: EM | Disposition: A | Discharge status: Home / Self Care | Payer: Self-pay | Source: Home / Self Care | Attending: Internal Medicine

## 2017-02-09 ENCOUNTER — Inpatient Hospital Stay (HOSPITAL_COMMUNITY): Payer: Medicare Other

## 2017-02-09 ENCOUNTER — Inpatient Hospital Stay (HOSPITAL_COMMUNITY): Payer: Medicare Other | Admitting: Certified Registered Nurse Anesthetist

## 2017-02-09 DIAGNOSIS — I4891 Unspecified atrial fibrillation: Secondary | ICD-10-CM

## 2017-02-09 DIAGNOSIS — R748 Abnormal levels of other serum enzymes: Secondary | ICD-10-CM

## 2017-02-09 HISTORY — PX: CHOLECYSTECTOMY: SHX55

## 2017-02-09 LAB — TROPONIN I
Troponin I: 0.93 ng/mL (ref <0.03)
Troponin I: 0.69 ng/mL (ref <0.03)

## 2017-02-09 LAB — ECHOCARDIOGRAM COMPLETE
Height: 70 in
WEIGHTICAEL: 2960 [oz_av]

## 2017-02-09 LAB — CBC
HCT: 32.7 % — ABNORMAL LOW (ref 39.0–52.0)
HEMOGLOBIN: 11.1 g/dL — AB (ref 13.0–17.0)
MCH: 33 pg (ref 26.0–34.0)
MCHC: 33.9 g/dL (ref 30.0–36.0)
MCV: 97.3 fL (ref 78.0–100.0)
Platelets: 155 10*3/uL (ref 150–400)
RBC: 3.36 MIL/uL — AB (ref 4.22–5.81)
RDW: 13.6 % (ref 11.5–15.5)
WBC: 11.7 10*3/uL — ABNORMAL HIGH (ref 4.0–10.5)

## 2017-02-09 LAB — HEPATIC FUNCTION PANEL
ALK PHOS: 51 U/L (ref 38–126)
ALT: 22 U/L (ref 17–63)
AST: 26 U/L (ref 15–41)
Albumin: 2.4 g/dL — ABNORMAL LOW (ref 3.5–5.0)
BILIRUBIN DIRECT: 0.1 mg/dL (ref 0.1–0.5)
BILIRUBIN TOTAL: 0.8 mg/dL (ref 0.3–1.2)
Indirect Bilirubin: 0.7 mg/dL (ref 0.3–0.9)
Total Protein: 5.2 g/dL — ABNORMAL LOW (ref 6.5–8.1)

## 2017-02-09 LAB — URINE CULTURE: Culture: NO GROWTH

## 2017-02-09 LAB — BASIC METABOLIC PANEL
ANION GAP: 4 — AB (ref 5–15)
BUN: 13 mg/dL (ref 6–20)
CHLORIDE: 110 mmol/L (ref 101–111)
CO2: 21 mmol/L — AB (ref 22–32)
Calcium: 7.6 mg/dL — ABNORMAL LOW (ref 8.9–10.3)
Creatinine, Ser: 0.81 mg/dL (ref 0.61–1.24)
GFR calc non Af Amer: >60 mL/min (ref >60)
Glucose, Bld: 113 mg/dL — ABNORMAL HIGH (ref 65–99)
Potassium: 3.2 mmol/L — ABNORMAL LOW (ref 3.5–5.1)
Sodium: 135 mmol/L (ref 135–145)

## 2017-02-09 SURGERY — LAPAROSCOPIC CHOLECYSTECTOMY WITH INTRAOPERATIVE CHOLANGIOGRAM
Anesthesia: General

## 2017-02-09 MED ORDER — BUPIVACAINE-EPINEPHRINE 0.25% -1:200000 IJ SOLN
INTRAMUSCULAR | Status: DC | PRN
  Administered 2017-02-09: 10 mL

## 2017-02-09 MED ORDER — CHLORHEXIDINE GLUCONATE CLOTH 2 % EX PADS: 6.0000 | MEDICATED_PAD | Freq: Once | CUTANEOUS | Status: DC

## 2017-02-09 MED ORDER — PROPOFOL 10 MG/ML IV BOLUS
INTRAVENOUS | Status: DC | PRN
  Administered 2017-02-09: 150 mg via INTRAVENOUS

## 2017-02-09 MED ORDER — SUGAMMADEX SODIUM 200 MG/2ML IV SOLN
INTRAVENOUS | Status: AC
  Filled 2017-02-09: qty 2

## 2017-02-09 MED ORDER — ASPIRIN EC 81 MG PO TBEC
81.0000 mg | DELAYED_RELEASE_TABLET | Freq: Every day | ORAL | Status: DC
  Administered 2017-02-09 – 2017-02-15 (×7): 81 mg via ORAL
  Filled 2017-02-09 (×7): qty 1

## 2017-02-09 MED ORDER — MORPHINE SULFATE (PF) 2 MG/ML IV SOLN: 2.0000 mg | INTRAVENOUS | Status: DC | PRN

## 2017-02-09 MED ORDER — FENTANYL CITRATE (PF) 250 MCG/5ML IJ SOLN
INTRAMUSCULAR | Status: AC
  Filled 2017-02-09: qty 5

## 2017-02-09 MED ORDER — ROCURONIUM BROMIDE 10 MG/ML (PF) SYRINGE
PREFILLED_SYRINGE | INTRAVENOUS | Status: AC
  Filled 2017-02-09: qty 5

## 2017-02-09 MED ORDER — ROCURONIUM BROMIDE 100 MG/10ML IV SOLN
INTRAVENOUS | Status: DC | PRN
  Administered 2017-02-09 (×2): 20 mg via INTRAVENOUS
  Administered 2017-02-09: 50 mg via INTRAVENOUS

## 2017-02-09 MED ORDER — HEMOSTATIC AGENTS (NO CHARGE) OPTIME
TOPICAL | Status: DC | PRN
  Administered 2017-02-09: 1 via TOPICAL

## 2017-02-09 MED ORDER — IOPAMIDOL (ISOVUE-300) INJECTION 61%
INTRAVENOUS | Status: AC
  Filled 2017-02-09: qty 50

## 2017-02-09 MED ORDER — PROPOFOL 10 MG/ML IV BOLUS
INTRAVENOUS | Status: AC
  Filled 2017-02-09: qty 20

## 2017-02-09 MED ORDER — SODIUM CHLORIDE 0.9 % IV SOLN
INTRAVENOUS | Status: DC | PRN
  Administered 2017-02-09: 9 mL

## 2017-02-09 MED ORDER — BUPIVACAINE-EPINEPHRINE (PF) 0.25% -1:200000 IJ SOLN
INTRAMUSCULAR | Status: AC
  Filled 2017-02-09: qty 30

## 2017-02-09 MED ORDER — LACTATED RINGERS IV SOLN
INTRAVENOUS | Status: DC | PRN
  Administered 2017-02-09: 16:00:00 via INTRAVENOUS

## 2017-02-09 MED ORDER — ONDANSETRON HCL 4 MG/2ML IJ SOLN
INTRAMUSCULAR | Status: AC
  Filled 2017-02-09: qty 2

## 2017-02-09 MED ORDER — DEXAMETHASONE SODIUM PHOSPHATE 10 MG/ML IJ SOLN
INTRAMUSCULAR | Status: AC
  Filled 2017-02-09: qty 1

## 2017-02-09 MED ORDER — DEXAMETHASONE SODIUM PHOSPHATE 10 MG/ML IJ SOLN
INTRAMUSCULAR | Status: DC | PRN
  Administered 2017-02-09: 10 mg via INTRAVENOUS

## 2017-02-09 MED ORDER — SUGAMMADEX SODIUM 200 MG/2ML IV SOLN
INTRAVENOUS | Status: DC | PRN
  Administered 2017-02-09: 170 mg via INTRAVENOUS

## 2017-02-09 MED ORDER — ONDANSETRON HCL 4 MG/2ML IJ SOLN
INTRAMUSCULAR | Status: DC | PRN
  Administered 2017-02-09: 4 mg via INTRAVENOUS

## 2017-02-09 MED ORDER — 0.9 % SODIUM CHLORIDE (POUR BTL) OPTIME
TOPICAL | Status: DC | PRN
  Administered 2017-02-09: 1000 mL

## 2017-02-09 MED ORDER — LIDOCAINE 2% (20 MG/ML) 5 ML SYRINGE
INTRAMUSCULAR | Status: DC | PRN
  Administered 2017-02-09: 100 mg via INTRAVENOUS

## 2017-02-09 MED ORDER — SODIUM CHLORIDE 0.9 % IR SOLN
Status: DC | PRN
  Administered 2017-02-09 (×2): 1000 mL

## 2017-02-09 MED ORDER — HYDROMORPHONE HCL 1 MG/ML IJ SOLN: 0.2500 mg | INTRAMUSCULAR | Status: DC | PRN

## 2017-02-09 MED ORDER — FENTANYL CITRATE (PF) 100 MCG/2ML IJ SOLN
INTRAMUSCULAR | Status: DC | PRN
  Administered 2017-02-09: 50 ug via INTRAVENOUS
  Administered 2017-02-09 (×2): 25 ug via INTRAVENOUS
  Administered 2017-02-09: 100 ug via INTRAVENOUS
  Administered 2017-02-09 (×2): 25 ug via INTRAVENOUS

## 2017-02-09 MED ORDER — LIDOCAINE 2% (20 MG/ML) 5 ML SYRINGE
INTRAMUSCULAR | Status: AC
  Filled 2017-02-09: qty 5

## 2017-02-09 SURGICAL SUPPLY — 54 items
APPLIER CLIP ROT 10 11.4 M/L (STAPLE) ×3
BENZOIN TINCTURE PRP APPL 2/3 (GAUZE/BANDAGES/DRESSINGS) ×3 IMPLANT
BLADE CLIPPER SURG (BLADE) ×3 IMPLANT
CANISTER SUCT 3000ML PPV (MISCELLANEOUS) ×3 IMPLANT
CHLORAPREP W/TINT 26ML (MISCELLANEOUS) ×3 IMPLANT
CLIP APPLIE ROT 10 11.4 M/L (STAPLE) ×1 IMPLANT
CLOSURE STERI-STRIP 1/2X4 (GAUZE/BANDAGES/DRESSINGS) ×1
CLOSURE WOUND 1/2 X4 (GAUZE/BANDAGES/DRESSINGS) ×1
CLSR STERI-STRIP ANTIMIC 1/2X4 (GAUZE/BANDAGES/DRESSINGS) ×2 IMPLANT
COVER MAYO STAND STRL (DRAPES) ×3 IMPLANT
COVER SURGICAL LIGHT HANDLE (MISCELLANEOUS) ×3 IMPLANT
DRAIN CHANNEL 19F RND (DRAIN) ×3 IMPLANT
DRAPE C-ARM 42X72 X-RAY (DRAPES) ×3 IMPLANT
DRSG TEGADERM 2-3/8X2-3/4 SM (GAUZE/BANDAGES/DRESSINGS) ×3 IMPLANT
DRSG TEGADERM 4X4.75 (GAUZE/BANDAGES/DRESSINGS) ×3 IMPLANT
ELECT REM PT RETURN 9FT ADLT (ELECTROSURGICAL) ×6
ELECTRODE REM PT RTRN 9FT ADLT (ELECTROSURGICAL) ×2 IMPLANT
EVACUATOR SILICONE 100CC (DRAIN) ×3 IMPLANT
FILTER SMOKE EVAC LAPAROSHD (FILTER) ×3 IMPLANT
GAUZE SPONGE 2X2 8PLY STRL LF (GAUZE/BANDAGES/DRESSINGS) ×1 IMPLANT
GLOVE BIO SURGEON STRL SZ 6.5 (GLOVE) ×2 IMPLANT
GLOVE BIO SURGEON STRL SZ7 (GLOVE) ×3 IMPLANT
GLOVE BIO SURGEON STRL SZ7.5 (GLOVE) ×3 IMPLANT
GLOVE BIO SURGEONS STRL SZ 6.5 (GLOVE) ×1
GLOVE BIOGEL PI IND STRL 6.5 (GLOVE) ×1 IMPLANT
GLOVE BIOGEL PI IND STRL 7.5 (GLOVE) ×1 IMPLANT
GLOVE BIOGEL PI INDICATOR 6.5 (GLOVE) ×2
GLOVE BIOGEL PI INDICATOR 7.5 (GLOVE) ×2
GLOVE SURG SS PI 6.0 STRL IVOR (GLOVE) ×3 IMPLANT
GOWN STRL REUS W/ TWL LRG LVL3 (GOWN DISPOSABLE) ×3 IMPLANT
GOWN STRL REUS W/TWL LRG LVL3 (GOWN DISPOSABLE) ×6
HEMOSTAT SNOW SURGICEL 2X4 (HEMOSTASIS) ×3 IMPLANT
KIT BASIN OR (CUSTOM PROCEDURE TRAY) ×3 IMPLANT
KIT ROOM TURNOVER OR (KITS) ×3 IMPLANT
NS IRRIG 1000ML POUR BTL (IV SOLUTION) ×3 IMPLANT
PAD ARMBOARD 7.5X6 YLW CONV (MISCELLANEOUS) ×3 IMPLANT
POUCH SPECIMEN RETRIEVAL 10MM (ENDOMECHANICALS) ×3 IMPLANT
SCISSORS LAP 5X35 DISP (ENDOMECHANICALS) ×3 IMPLANT
SET CHOLANGIOGRAPH 5 50 .035 (SET/KITS/TRAYS/PACK) ×3 IMPLANT
SET IRRIG TUBING LAPAROSCOPIC (IRRIGATION / IRRIGATOR) ×3 IMPLANT
SLEEVE ENDOPATH XCEL 5M (ENDOMECHANICALS) ×3 IMPLANT
SPECIMEN JAR SMALL (MISCELLANEOUS) ×3 IMPLANT
SPONGE GAUZE 2X2 STER 10/PKG (GAUZE/BANDAGES/DRESSINGS) ×2
STRIP CLOSURE SKIN 1/2X4 (GAUZE/BANDAGES/DRESSINGS) ×2 IMPLANT
SUT ETHILON 3 0 FSL (SUTURE) ×3 IMPLANT
SUT MNCRL AB 4-0 PS2 18 (SUTURE) ×3 IMPLANT
SUT VICRYL 0 UR6 27IN ABS (SUTURE) ×3 IMPLANT
TOWEL OR 17X24 6PK STRL BLUE (TOWEL DISPOSABLE) ×3 IMPLANT
TOWEL OR 17X26 10 PK STRL BLUE (TOWEL DISPOSABLE) IMPLANT
TRAY LAPAROSCOPIC MC (CUSTOM PROCEDURE TRAY) ×3 IMPLANT
TROCAR XCEL BLUNT TIP 100MML (ENDOMECHANICALS) ×3 IMPLANT
TROCAR XCEL NON-BLD 11X100MML (ENDOMECHANICALS) ×3 IMPLANT
TROCAR XCEL NON-BLD 5MMX100MML (ENDOMECHANICALS) ×3 IMPLANT
TUBING INSUFFLATION (TUBING) ×3 IMPLANT

## 2017-02-09 NOTE — Progress Notes (Signed)
I have reviewed the patient's echocardiogram personally. I feel his LV function is normal and there is no evidence of wall motion abnormalities. Follow-up troponin is 0.93. Patient has never had any cardiac symptoms including no chest pain with exertion and no dyspnea. His heart rate has improved with the addition of IV Cardizem. I think his mildly elevated troponin is demand ischemia in the setting of atrial fibrillation with rapid ventricular response. I have also discussed the patient with Drs. Mclean, Nahser and [place]. We all feel that based on the above he may proceed with cholecystectomy. I discussed this with the patient and his wife and they are in agreement. I explained that there is always some risk with any procedure and they understand that as well. We will follow closely postoperatively. I would like to change his Cardizem to a beta blocker postoperatively when he is taking oral medications. We will also begin anticoagulation postoperatively when okay with surgery for his atrial fibrillation. Once he has recovered from the above we will likely plan stress nuclear study for risk stratification.  Kirk Ruths, MD

## 2017-02-09 NOTE — Anesthesia Procedure Notes (Addendum)
Procedure Name: Intubation Date/Time: 02/09/2017 3:47 PM Performed by: Trixie Deis A Pre-anesthesia Checklist: Patient identified, Emergency Drugs available, Suction available and Patient being monitored Patient Re-evaluated:Patient Re-evaluated prior to inductionOxygen Delivery Method: Circle System Utilized Preoxygenation: Pre-oxygenation with 100% oxygen Intubation Type: IV induction Ventilation: Mask ventilation without difficulty Laryngoscope Size: Mac and 4 Grade View: Grade II Tube type: Oral Tube size: 7.5 mm Number of attempts: 1 Airway Equipment and Method: Stylet and Oral airway Placement Confirmation: ETT inserted through vocal cords under direct vision,  positive ETCO2 and breath sounds checked- equal and bilateral Secured at: 23 cm Tube secured with: Tape Dental Injury: Teeth and Oropharynx as per pre-operative assessment

## 2017-02-09 NOTE — Progress Notes (Signed)
PROGRESS NOTE    Daniel Yu  PJK:[id number] DOB: Oct 02, 1931 DOA: 02/08/2017 PCP: Lavone Orn, MD   Brief Narrative:  83 y.o. WM  PMHx HTN, Asthma ,SBO  Presents emergency room chief complaint of nausea and weakness. Patient states that over last 2 days he's had decreased appetite. Not able to tolerate liquids or solids without some degree nausea. This worsened over time. Patient also developed lower abdominal pain bilaterally. No trauma or sick contacts. Patient went to [facility] at lunch time and then began to have worsening abdominal discomfort. At home later pt went to the rest room and tried to vomit but couldn't. At one point he became weak and slid off the bed onto the floor. At this point his wife activated EMS.  Denies fever, chills, rash, altered mental status, chest pain. Positive for shortness of breath. Did not take his inhaler this morning.  ED course: Started on sepsis protocol. CT revealed inflamed bowel and gallbladder. Right upper quadrant ultrasound showed acute cholecystitis per general surgery. Hospitalists admitted patient.   Subjective: Patient OR; unable to assess   Assessment & Plan:   Principal Problem:   Sepsis ([place]) Active Problems:   HTN (hypertension)   Asthma   Arthritis   Abnormal abdominal CT scan  Sepsis 2/2 abdominal process Patient hemodynamically stable Given vanc emergency room, will continue Given zosyn in the emergency room, will continue Adding flagyl Urine culture pending, UA clean Blood cultures 2 pending Patient given 3000 mL of fluid in the emergency room Lactic acid 3.94-> 3.25-> pending Lipase normal  Atrial fibrillation Initial troponin negative Repeat ordered Correcting electrolytes Echo ordered Cardiology consult ordered for preoperative clearance Patient started on diltiazem drip which was effective control heart rate, and no bolus given due to soft blood pressure  Acute Cholecystitis Abnormal abdominal CT  scan--> acute cholecystitis Right upper quadrant ultrasound ordered and pending General surgery: Currently in OR/recovery  Low potassium and calcium Corrected calcium for albumin 8.7 65meq of K ordered by EDP Replace IV Recheck Mag pending Checking Phos  Asthma When necessary albuterol Cont flovent Slight wheeze today, will check CXR in AM Low suspicion of asthma exacerbation, will hold off on steroids  Hypertension When necessary hydralazine 10 mg IV as needed for severe blood pressure Cont diovan HCT  Arthritis Prn tylenol  Anemia Appears stable, will monitor  Allergies Hold zyrtec  Hiccups Hold thorazine prn    DVT prophylaxis: Lovenox Code Status: Full Family Communication: None Disposition Plan: Per surgery   Consultants:  General surgery Dr. Donnie Mesa Cardiology Dr. Kirk Ruths      Procedures/Significant Events:  6/22 Echocardiogram:- LVEF = 45% to 50%., Diffuse hypokinesis. - Pulmonary arteries: PA peak pressure: 32 mm Hg (S).   VENTILATOR SETTINGS:    Cultures   Antimicrobials: Anti-infectives    Start     Stop   02/08/17 1800  [MAR Hold]  vancomycin (VANCOCIN) IVPB 750 mg/150 ml premix     (MAR Hold since 02/09/17 1337)       02/08/17 1300  [MAR Hold]  piperacillin-tazobactam (ZOSYN) IVPB 3.375 g     (MAR Hold since 02/09/17 1337)       02/08/17 0900  [MAR Hold]  metroNIDAZOLE (FLAGYL) IVPB 500 mg     (MAR Hold since 02/09/17 1337)       02/08/17 0445  vancomycin (VANCOCIN) IVPB 1000 mg/200 mL premix     02/08/17 0633   02/08/17 0445  piperacillin-tazobactam (ZOSYN) IVPB 3.375 g  02/08/17 0525       Devices    LINES / TUBES:      Continuous Infusions: . sodium chloride 125 mL/hr at 02/09/17 1239  . [MAR Hold] diltiazem (CARDIZEM) infusion 10 mg/hr (02/09/17 0826)  . [MAR Hold] metronidazole Stopped (02/09/17 1118)  . [MAR Hold] piperacillin-tazobactam (ZOSYN)  IV 3.375 g (02/09/17 1239)  . [MAR  Hold] vancomycin Stopped (02/09/17 0604)     Objective: Vitals:   02/08/17 2300 02/09/17 0033 02/09/17 0434 02/09/17 0740  BP: 107/64 116/77 123/77 118/79  Pulse: 89 (!) 104 (!) 102 99  Resp:  (!) 28 (!) 27   Temp:   97.6 F (36.4 C) 99 F (37.2 C)  TempSrc:   Oral Oral  SpO2: 97% 97% 96% 97%  Weight:   185 lb (83.9 kg)   Height:        Intake/Output Summary (Last 24 hours) at 02/09/17 1420 Last data filed at 02/09/17 0700  Gross per 24 hour  Intake          3428.25 ml  Output                0 ml  Net          3428.25 ml   Filed Weights   02/08/17 0320 02/09/17 0434  Weight: 185 lb (83.9 kg) 185 lb (83.9 kg)    Examination:  Unable to assess patient OR/recovery all day .     Data Reviewed: Care during the described time interval was provided by me .  I have reviewed this patient's available data, including medical history, events of note, physical examination, and all test results as part of my evaluation. I have personally reviewed and interpreted all radiology studies.  CBC:  Recent Labs Lab 02/08/17 0344 02/08/17 0535 02/08/17 0805 02/08/17 1656 02/09/17 0327  WBC 9.6  --  14.7* 14.7* 11.7*  NEUTROABS 9.2*  --   --   --   --   HGB 12.7* 11.6* 11.9* 12.2* 11.1*  HCT 36.9* 34.0* 35.1* 36.4* 32.7*  MCV 94.9  --  95.9 97.3 97.3  PLT 200  --  164 153 425   Basic Metabolic Panel:  Recent Labs Lab 02/08/17 0516 02/08/17 0535 02/08/17 0805 02/08/17 1656 02/09/17 0327  NA 134* 136  --   --  135  K 3.0* 3.1*  --   --  3.2*  CL 106 102  --   --  110  CO2 17*  --   --   --  21*  GLUCOSE 104* 102*  --   --  113*  BUN 15 16  --   --  13  CREATININE 1.01 0.80 0.99  --  0.81  CALCIUM 7.7*  --   --   --  7.6*  MG  --   --   --  1.9  --   PHOS  --   --   --  2.6  --    GFR: Estimated Creatinine Clearance: 72.6 mL/min (by C-G formula based on SCr of 0.81 mg/dL). Liver Function Tests:  Recent Labs Lab 02/08/17 0516 02/09/17 0327  AST 28 26  ALT 14*  22  ALKPHOS 51 51  BILITOT 1.3* 0.8  PROT 5.6* 5.2*  ALBUMIN 2.7* 2.4*    Recent Labs Lab 02/08/17 0515  LIPASE 18   No results for input(s): AMMONIA in the last 168 hours. Coagulation Profile: No results for input(s): INR, PROTIME in the last 168 hours. Cardiac Enzymes:  Recent Labs Lab 02/08/17 1656 02/09/17 1014  TROPONINI 1.07* 0.93*   BNP (last 3 results) No results for input(s): PROBNP in the last 8760 hours. HbA1C: No results for input(s): HGBA1C in the last 72 hours. CBG: No results for input(s): GLUCAP in the last 168 hours. Lipid Profile: No results for input(s): CHOL, HDL, LDLCALC, TRIG, CHOLHDL, LDLDIRECT in the last 72 hours. Thyroid Function Tests:  Recent Labs  02/08/17 1656  TSH 1.834   Anemia Panel: No results for input(s): VITAMINB12, FOLATE, FERRITIN, TIBC, IRON, RETICCTPCT in the last 72 hours. Urine analysis:    Component Value Date/Time   COLORURINE AMBER (A) 02/08/2017 0523   APPEARANCEUR CLEAR 02/08/2017 0523   LABSPEC 1.028 02/08/2017 0523   PHURINE 5.0 02/08/2017 0523   GLUCOSEU NEGATIVE 02/08/2017 0523   HGBUR SMALL (A) 02/08/2017 0523   BILIRUBINUR NEGATIVE 02/08/2017 0523   KETONESUR NEGATIVE 02/08/2017 0523   PROTEINUR 30 (A) 02/08/2017 0523   NITRITE NEGATIVE 02/08/2017 0523   LEUKOCYTESUR NEGATIVE 02/08/2017 0523   Sepsis Labs: @LABRCNTIP (procalcitonin:4,lacticidven:4)  ) Recent Results (from the past 240 hour(s))  Blood Culture (routine x 2)     Status: None (Preliminary result)   Collection Time: 02/08/17  4:10 AM  Result Value Ref Range Status   Specimen Description BLOOD RIGHT HAND  Final   Special Requests   Final    BOTTLES DRAWN AEROBIC AND ANAEROBIC Blood Culture adequate volume   Culture NO GROWTH 1 DAY  Final   Report Status PENDING  Incomplete  Blood Culture (routine x 2)     Status: None (Preliminary result)   Collection Time: 02/08/17  4:21 AM  Result Value Ref Range Status   Specimen Description BLOOD  LEFT HAND  Final   Special Requests   Final    BOTTLES DRAWN AEROBIC AND ANAEROBIC Blood Culture adequate volume   Culture NO GROWTH 1 DAY  Final   Report Status PENDING  Incomplete  Urine culture     Status: None   Collection Time: 02/08/17  5:23 AM  Result Value Ref Range Status   Specimen Description URINE, CATHETERIZED  Final   Special Requests NONE  Final   Culture NO GROWTH  Final   Report Status 02/09/2017 FINAL  Final  Surgical pcr screen     Status: None   Collection Time: 02/08/17  2:09 PM  Result Value Ref Range Status   MRSA, PCR NEGATIVE NEGATIVE Final   Staphylococcus aureus NEGATIVE NEGATIVE Final    Comment:        The Xpert SA Assay (FDA approved for NASAL specimens in patients over 53 years of age), is one component of a comprehensive surveillance program.  Test performance has been validated by [facility] for patients greater than or equal to 81 year old. It is not intended to diagnose infection nor to guide or monitor treatment.          Radiology Studies: Ct Abdomen Pelvis W Contrast  Result Date: 02/08/2017 CLINICAL DATA:  Diffuse abdominal cramping.  Sepsis. EXAM: CT ABDOMEN AND PELVIS WITH CONTRAST TECHNIQUE: Multidetector CT imaging of the abdomen and pelvis was performed using the standard protocol following bolus administration of intravenous contrast. CONTRAST:  131mL ISOVUE-300 IOPAMIDOL (ISOVUE-300) INJECTION 61% COMPARISON:  Radiographs earlier this day.  CT 01/03/2016 FINDINGS: Lower chest: Coronary artery calcifications. Lower lobe bronchial thickening. Dependent lower lobe densities, atelectasis on the left, indeterminate on the right. Hepatobiliary: Gallbladder is distended with gallbladder wall thickening and adjacent pericholecystic fluid. No calcified  gallstones. There is no biliary dilatation. No focal hepatic lesion. Pancreas: No ductal dilatation or inflammation. Spleen: Normal in size without focal abnormality. Adrenals/Urinary Tract:  Normal adrenal glands. No hydronephrosis. Small volume of right perinephric fluid may be sympathetic. Symmetric renal excretion on delayed phase imaging. Urinary bladder is nondistended. Stomach/Bowel: The stomach is decompressed. Fluid within upper abdominal and lower pelvic bowel lobes which are nondilated. Minimal liquid stool in the cecum and transverse colon. Formed stool in the more distal colon. No colonic inflammation. There is wall thickening of the cecum and ascending colon. The dilated small bowel loop in radiograph is not present. The appendix is not confidently visualized. Vascular/Lymphatic: Aortic and branch atherosclerosis without aneurysm. No adenopathy. Reproductive: Prominent prostate gland. Other: Small volume of free fluid tracks in the pelvis. No free air. No intra-abdominal abscess. Musculoskeletal: Degenerative change throughout the spine and in both hips. There are no acute or suspicious osseous abnormalities. IMPRESSION: 1. Findings suspicious for acute cholecystitis. No calcified gallstones are seen, however there is gallbladder distention, wall thickening and pericholecystic fluid. 2. Colonic wall thickening with liquid stool in the ascending colon, may be reactive versus less likely primary colitis. Fluid-filled nondilated small bowel, mild ileus. 3. Minimal right perinephric fluid is likely reactive. No evidence pyelonephritis. 4. Dependent lower lobes densities in the lung bases, atelectasis on the left, indeterminate on the right for atelectasis versus pneumonia. 5. Aortic atherosclerosis. Electronically Signed   By: Jeb Levering M.D.   On: 02/08/2017 06:33   Dg Chest Port 1 View  Result Date: 02/09/2017 CLINICAL DATA:  Wheezing. EXAM: PORTABLE CHEST 1 VIEW COMPARISON:  02/08/2017. FINDINGS: Mediastinum and hilar structures normal. Cardiomegaly with normal pulmonary vascularity. Low lung volumes with mild basilar atelectasis. Mild infiltrate right lung base cannot be excluded.  Stable calcified density left apex, most likely calcified granuloma. No pleural effusion or pneumothorax . IMPRESSION: 1. Cardiomegaly with normal pulmonary vascularity. 2. Low lung volumes with mild bibasilar atelectasis. Mild infiltrate right lung base cannot be excluded . Electronically Signed   By: Marcello Moores  Register   On: 02/09/2017 07:38   Dg Abd Acute W/chest  Result Date: 02/08/2017 CLINICAL DATA:  Abdominal cramping and lower abdominal pain. Hypotension. EXAM: DG ABDOMEN ACUTE W/ 1V CHEST COMPARISON:  Radiographs and CT May 2017 FINDINGS: The cardiomediastinal contours are normal. Atherosclerosis of the thoracic aorta. Mild bibasilar atelectasis. Mild elevation of right hemidiaphragm. There is no free intra-abdominal air. Dilated small bowel in the central abdomen measures up to 4.6 cm. Air within normal caliber transverse colon. Small volume of distal colonic stool. There is otherwise relative paucity of bowel gas. No radiopaque calculi. No acute osseous abnormalities are seen. Degenerative change throughout spine. IMPRESSION: Dilated central small bowel. Air within the colon. Findings may reflect ileus or enteritis, overall nonspecific bowel gas pattern. Mild bibasilar atelectasis.  Thoracic aortic atherosclerosis. Electronically Signed   By: Jeb Levering M.D.   On: 02/08/2017 04:57   US Abdomen Limited Ruq  Result Date: 02/08/2017 CLINICAL DATA:  83 year old male with right upper quadrant abdominal pain for 2 days. EXAM: ULTRASOUND ABDOMEN LIMITED RIGHT UPPER QUADRANT COMPARISON:  02/08/2017 CT FINDINGS: Gallbladder: A large amount of gallbladder sludge noted. Gallbladder wall thickening and pericholecystic fluid noted. No definite cholelithiasis or sonographic Murphy's sign. Common bile duct: Diameter: 3 mm. There is no evidence of intrahepatic or extrahepatic biliary dilatation. Liver: No focal lesion identified. Within normal limits in parenchymal echogenicity. A small amount of free fluid  in Morison's pouch noted. IMPRESSION:  Gallbladder wall thickening with pericholecystic fluid and large amount of gallbladder sludge suspicious for acute cholecystitis in conjunction with CT findings. No biliary dilatation. Electronically Signed   By: Margarette Canada M.D.   On: 02/08/2017 07:42        Scheduled Meds: . [MAR Hold] aspirin EC  81 mg Oral Daily  . [MAR Hold] budesonide (PULMICORT) nebulizer solution  0.25 mg Nebulization BID  . Chlorhexidine Gluconate Cloth  6 each Topical Once   And  . Chlorhexidine Gluconate Cloth  6 each Topical Once  . [MAR Hold] enoxaparin (LOVENOX) injection  40 mg Subcutaneous Q24H  . [MAR Hold] sodium chloride flush  3 mL Intravenous Q12H   Continuous Infusions: . sodium chloride 125 mL/hr at 02/09/17 1239  . [MAR Hold] diltiazem (CARDIZEM) infusion 10 mg/hr (02/09/17 0826)  . [MAR Hold] metronidazole Stopped (02/09/17 1118)  . [MAR Hold] piperacillin-tazobactam (ZOSYN)  IV 3.375 g (02/09/17 1239)  . [MAR Hold] vancomycin Stopped (02/09/17 0604)     LOS: 1 day    Time spent: 0 minutes    Lavern Crimi, Geraldo Docker, MD [facility] Pager [phone]   If 7PM-7AM, please contact night-coverage [email or web address] Password [email or web address] 02/09/2017, 2:20 PM

## 2017-02-09 NOTE — Discharge Instructions (Addendum)
Please arrive at least 30 min before your appointment to complete your check in paperwork.  If you are unable to arrive 30 min prior to your appointment time we may have to cancel or reschedule you. ° °LAPAROSCOPIC SURGERY: POST OP INSTRUCTIONS  °1. DIET: Follow a light bland diet the first 24 hours after arrival home, such as soup, liquids, crackers, etc. Be sure to include lots of fluids daily. Avoid fast food or heavy meals as your are more likely to get nauseated. Eat a low fat the next few days after surgery.  °2. Take your usually prescribed home medications unless otherwise directed. °3. PAIN CONTROL:  °1. Pain is best controlled by a usual combination of three different methods TOGETHER:  °1. Ice/Heat °2. Over the counter pain medication °3. Prescription pain medication °2. Most patients will experience some swelling and bruising around the incisions. Ice packs or heating pads (30-60 minutes up to 6 times a day) will help. Use ice for the first few days to help decrease swelling and bruising, then switch to heat to help relax tight/sore spots and speed recovery. Some people prefer to use ice alone, heat alone, alternating between ice & heat. Experiment to what works for you. Swelling and bruising can take several weeks to resolve.  °3. It is helpful to take an over-the-counter pain medication regularly for the first few weeks. Choose one of the following that works best for you:  °1. Naproxen (Aleve, etc) Two 220mg tabs twice a day °2. Ibuprofen (Advil, etc) Three 200mg tabs four times a day (every meal & bedtime) °3. Acetaminophen (Tylenol, etc) 500-650mg four times a day (every meal & bedtime) °4. A prescription for pain medication (such as oxycodone, hydrocodone, etc) should be given to you upon discharge. Take your pain medication as prescribed.  °1. If you are having problems/concerns with the prescription medicine (does not control pain, nausea, vomiting, rash, itching, etc), please call us [phone]  [phone] to see if we need to switch you to a different pain medicine that will work better for you and/or control your side effect better. °2. If you need a refill on your pain medication, please contact your pharmacy. They will contact our office to request authorization. Prescriptions will not be filled after 5 pm or on week-ends. °4. Avoid getting constipated. Between the surgery and the pain medications, it is common to experience some constipation. Increasing fluid intake and taking a fiber supplement (such as Metamucil, Citrucel, FiberCon, MiraLax, etc) 1-2 times a day regularly will usually help prevent this problem from occurring. A mild laxative (prune juice, Milk of Magnesia, MiraLax, etc) should be taken according to package directions if there are no bowel movements after 48 hours.  °5. Watch out for diarrhea. If you have many loose bowel movements, simplify your diet to bland foods & liquids for a few days. Stop any stool softeners and decrease your fiber supplement. Switching to mild anti-diarrheal medications (Kayopectate, Pepto Bismol) can help. If this worsens or does not improve, please call us. °6. Wash / shower every day. You may shower over the dressings as they are waterproof. Continue to shower over incision(s) after the dressing is off. If there is glue over the incisions try not to pick it off, let it fall off naturally. °7. Remove your waterproof bandages 2 days after surgery. You may leave the incision open to air. You may replace a dressing/Band-Aid to cover the incision for comfort if you wish.  °8. ACTIVITIES as tolerated:  °  1. You may resume regular (light) daily activities beginning the next day--such as daily self-care, walking, climbing stairs--gradually increasing activities as tolerated. If you can walk 30 minutes without difficulty, it is safe to try more intense activity such as jogging, treadmill, bicycling, low-impact aerobics, swimming, etc. 2. Save the most intensive and  strenuous activity for last such as sit-ups, heavy lifting, contact sports, etc Refrain from any heavy lifting or straining until you are off narcotics for pain control. For the first 2-3 weeks do not lift over 10-15lb.  3. DO NOT PUSH THROUGH PAIN. Let pain be your guide: If it hurts to do something, don't do it. Pain is your body warning you to avoid that activity for another week until the pain goes down. 4. You may drive when you are no longer taking prescription pain medication, you can comfortably wear a seatbelt, and you can safely maneuver your car and apply brakes. 5. You may have sexual intercourse when it is comfortable.  9. FOLLOW UP in our office  1. Please call [facility] at [phone] to set up an appointment to see your surgeon in the office for a follow-up appointment approximately 2-3 weeks after your surgery. 2. Make sure that you call for this appointment the day you arrive home to insure a convenient appointment time.      10. IF YOU HAVE DISABILITY OR FAMILY LEAVE FORMS, BRING THEM TO THE               OFFICE FOR PROCESSING.   WHEN TO CALL us [phone]:  1. Poor pain control 2. Reactions / problems with new medications (rash/itching, nausea, etc)  3. Fever over 101.5 F (38.5 C) 4. Inability to urinate 5. Nausea and/or vomiting 6. Worsening swelling or bruising 7. Continued bleeding from incision. 8. Increased pain, redness, or drainage from the incision  The clinic staff is available to answer your questions during regular business hours (8:30am-5pm). Please dont hesitate to call and ask to speak to one of our nurses for clinical concerns.  If you have a medical emergency, go to the nearest emergency room or call 911.  A surgeon from [facility] is always on call at the [facility], [place], [place], [place], [place] [address] ?  MAIN: [phone] ? TOLL FREE: [phone] ?  FAX [phone]    [email or web address]   Information on my medicine - Coumadin   (Warfarin)  This medication education was reviewed with me or my healthcare representative as part of my discharge preparation.  The pharmacist that spoke with me during my hospital stay was:  Tad Moore, [facility]  Why was Coumadin prescribed for you? Coumadin was prescribed for you because you have a blood clot or a medical condition that can cause an increased risk of forming blood clots. Blood clots can cause serious health problems by blocking the flow of blood to the heart, lung, or brain. Coumadin can prevent harmful blood clots from forming. As a reminder your indication for Coumadin is:   Stroke Prevention Because Of Atrial Fibrillation  What test will check on my response to Coumadin? While on Coumadin (warfarin) you will need to have an INR test regularly to ensure that your dose is keeping you in the desired range. The INR (international normalized ratio) number is calculated from the result of the laboratory test called prothrombin time (PT).  If an INR APPOINTMENT HAS NOT ALREADY BEEN MADE  FOR YOU please schedule an appointment to have this lab work done by your health care provider within 7 days. Your INR goal is usually a number between:  2 to 3 or your provider may give you a more narrow range like 2-2.5.  Ask your health care provider during an office visit what your goal INR is.  What  do you need to  know  About  COUMADIN? Take Coumadin (warfarin) exactly as prescribed by your healthcare provider about the same time each day.  DO NOT stop taking without talking to the doctor who prescribed the medication.  Stopping without other blood clot prevention medication to take the place of Coumadin may increase your risk of developing a new clot or stroke.  Get refills before you run out.  What do you do if you miss a dose? If you miss a dose, take it as soon as you remember on the same day then continue your  regularly scheduled regimen the next day.  Do not take two doses of Coumadin at the same time.  Important Safety Information A possible side effect of Coumadin (Warfarin) is an increased risk of bleeding. You should call your healthcare provider right away if you experience any of the following: ? Bleeding from an injury or your nose that does not stop. ? Unusual colored urine (red or dark brown) or unusual colored stools (red or black). ? Unusual bruising for unknown reasons. ? A serious fall or if you hit your head (even if there is no bleeding).  Some foods or medicines interact with Coumadin (warfarin) and might alter your response to warfarin. To help avoid this: ? Eat a balanced diet, maintaining a consistent amount of Vitamin K. ? Notify your provider about major diet changes you plan to make. ? Avoid alcohol or limit your intake to 1 drink for women and 2 drinks for men per day. (1 drink is 5 oz. wine, 12 oz. beer, or 1.5 oz. liquor.)  Make sure that ANY health care provider who prescribes medication for you knows that you are taking Coumadin (warfarin).  Also make sure the healthcare provider who is monitoring your Coumadin knows when you have started a new medication including herbals and non-prescription products.  Coumadin (Warfarin)  Major Drug Interactions  Increased Warfarin Effect Decreased Warfarin Effect  Alcohol (large quantities) Antibiotics (esp. Septra/Bactrim, Flagyl, Cipro) Amiodarone (Cordarone) Aspirin (ASA) Cimetidine (Tagamet) Megestrol (Megace) NSAIDs (ibuprofen, naproxen, etc.) Piroxicam (Feldene) Propafenone (Rythmol SR) Propranolol (Inderal) Isoniazid (INH) Posaconazole (Noxafil) Barbiturates (Phenobarbital) Carbamazepine (Tegretol) Chlordiazepoxide (Librium) Cholestyramine (Questran) Griseofulvin Oral Contraceptives Rifampin Sucralfate (Carafate) Vitamin K   Coumadin (Warfarin) Major Herbal Interactions  Increased Warfarin Effect Decreased  Warfarin Effect  Garlic Ginseng Ginkgo biloba Coenzyme Q10 Green tea St. Johns wort    Coumadin (Warfarin) FOOD Interactions  Eat a consistent number of servings per week of foods HIGH in Vitamin K (1 serving =  cup)  Collards (cooked, or boiled & drained) Kale (cooked, or boiled & drained) Mustard greens (cooked, or boiled & drained) Parsley *serving size only =  cup Spinach (cooked, or boiled & drained) Swiss chard (cooked, or boiled & drained) Turnip greens (cooked, or boiled & drained)  Eat a consistent number of servings per week of foods MEDIUM-HIGH in Vitamin K (1 serving = 1 cup)  Asparagus (cooked, or boiled & drained) Broccoli (cooked, boiled & drained, or raw & chopped) Brussel sprouts (cooked, or boiled & drained) *serving size only =  cup Lettuce, raw (green leaf,  endive, romaine) Spinach, raw Turnip greens, raw & chopped   These websites have more information on Coumadin (warfarin):  FailFactory.se; VeganReport.com.au;

## 2017-02-09 NOTE — Progress Notes (Signed)
  Echocardiogram 2D Echocardiogram has been performed.  Daniel Yu 02/09/2017, 8:49 AM

## 2017-02-09 NOTE — Transfer of Care (Signed)
Immediate Anesthesia Transfer of Care Note  Patient: Daniel Yu  Procedure(s) Performed: Procedure(s): LAPAROSCOPIC CHOLECYSTECTOMY WITH INTRAOPERATIVE CHOLANGIOGRAM (N/A)  Patient Location: PACU  Anesthesia Type:General  Level of Consciousness: awake, alert  and oriented  Airway & Oxygen Therapy: Patient Spontanous Breathing and Patient connected to nasal cannula oxygen  Post-op Assessment: Report given to RN, Post -op Vital signs reviewed and stable and Patient moving all extremities  Post vital signs: Reviewed and stable  Last Vitals:  Vitals:   02/09/17 0740 02/09/17 1749  BP: 118/79 (!) 141/70  Pulse: 99 (!) 107  Resp:  15  Temp: 37.2 C 36.2 C    Last Pain:  Vitals:   02/09/17 0740  TempSrc: Oral  PainSc:       Patients Stated Pain Goal: 0 (50/51/83 3582)  Complications: No apparent anesthesia complications

## 2017-02-09 NOTE — Op Note (Signed)
Laparoscopic Cholecystectomy with IOC Procedure Note  Indications: This patient presents with symptomatic gallbladder disease and will undergo laparoscopic cholecystectomy.  He has been cleared by cardiology.  Pre-operative Diagnosis: Calculus of gallbladder with acute cholecystitis, without mention of obstruction  Post-operative Diagnosis: Same  Surgeon: Moriya Mitchell K.   Assistants: none  Anesthesia: General endotracheal anesthesia  ASA Class: 2  Procedure Details  The patient was seen again in the Holding Room. The risks, benefits, complications, treatment options, and expected outcomes were discussed with the patient. The possibilities of reaction to medication, pulmonary aspiration, perforation of viscus, bleeding, recurrent infection, finding a normal gallbladder, the need for additional procedures, failure to diagnose a condition, the possible need to convert to an open procedure, and creating a complication requiring transfusion or operation were discussed with the patient. The likelihood of improving the patient's symptoms with return to their baseline status is good.  The patient and/or family concurred with the proposed plan, giving informed consent. The site of surgery properly noted. The patient was taken to Operating Room, identified as Daniel Yu and the procedure verified as Laparoscopic Cholecystectomy with Intraoperative Cholangiogram. A Time Out was held and the above information confirmed.  Prior to the induction of general anesthesia, antibiotic prophylaxis was administered. General endotracheal anesthesia was then administered and tolerated well. After the induction, the abdomen was prepped with Chloraprep and draped in the sterile fashion. The patient was positioned in the supine position.  Local anesthetic agent was injected into the skin below the umbilicus and an incision made. We dissected down to the abdominal fascia with blunt dissection.  The fascia was incised  vertically and we entered the peritoneal cavity bluntly.  A pursestring suture of 0-Vicryl was placed around the fascial opening.  The Hasson cannula was inserted and secured with the stay suture.  Pneumoperitoneum was then created with CO2 and tolerated well without any adverse changes in the patient's vital signs. An 11-mm port was placed in the subxiphoid position.  Two 5-mm ports were placed in the right upper quadrant. All skin incisions were infiltrated with a local anesthetic agent before making the incision and placing the trocars.   We positioned the patient in reverse Trendelenburg, tilted slightly to the patient's left.  The omentum is densely adherent to the edge of the liver, completely obscuring the gallbladder.  We bluntly dissected the omentum away from the liver, exposing a very large, thickened, edematous, erythematous gallbladder.  We decompressed the gallbladder with the suction aspirator, encountered a large amount of purulent fluid.  The gallbladder was identified, the fundus grasped and retracted cephalad. Adhesions were lysed bluntly and with the electrocautery where indicated, taking care not to injure any adjacent organs or viscus. The infundibulum was grasped and retracted laterally, exposing the peritoneum overlying the triangle of Calot. This was then divided and exposed in a blunt fashion. A critical view of the cystic duct and cystic artery was obtained.  The cystic duct was clearly identified and bluntly dissected circumferentially. The cystic duct was ligated with a clip distally.   An incision was made in the cystic duct and the [facility] cholangiogram catheter introduced. The catheter was secured using a clip. A cholangiogram was then obtained which showed good visualization of the distal and proximal biliary tree with no sign of filling defects or obstruction.  Contrast flowed easily into the duodenum. The catheter was then removed.   The cystic duct was then ligated with clips and  divided. The cystic artery was identified, dissected  free, ligated with clips and divided as well.   The gallbladder was dissected from the liver bed in retrograde fashion with the electrocautery. This was extremely difficult due the size of the gallbladder, inflammatory adhesions, and difficulty visualizing the dissection plan.  The gallbladder was removed and placed in an Eco sac. The liver bed was irrigated and inspected.  Copious irrigation was utilized and was repeatedly aspirated until clear.  We packed surgicel SNOW into the gallbladder fossa.  A 19 French drain was placed into the gallbladder fossa, exiting through the right lateral port site. The gallbladder and Eco sac were then removed through the umbilical port site.  We had to enlarge the opening to remove the large gallbladder.  The pursestring suture was used to close the umbilical fascia.  An additional 0 Vicryl suture was used to close the fascia.  We again inspected the right upper quadrant for hemostasis.  Pneumoperitoneum was released as we removed the trocars.  4-0 Monocryl was used to close the skin.   Benzoin, steri-strips, and clean dressings were applied. The patient was then extubated and brought to the recovery room in stable condition. Instrument, sponge, and needle counts were correct at closure and at the conclusion of the case.   Findings: Severe Cholecystitis with Cholelithiasis  Estimated Blood Loss: less than 100 mL         Drains: JP drain in GB fossa         Specimens: Gallbladder           Complications: None; patient tolerated the procedure well.         Disposition: PACU - hemodynamically stable.         Condition: stable  Imogene Burn. Georgette Dover, MD, [facility]  General/ Trauma Surgery  02/09/2017 5:52 PM

## 2017-02-09 NOTE — Progress Notes (Signed)
Pt went down for surgery attempted to call pt wife twice.

## 2017-02-09 NOTE — Progress Notes (Signed)
Pt came up from OR. Pt denies any pain. Dressing clean dry and intact.

## 2017-02-09 NOTE — Anesthesia Preprocedure Evaluation (Addendum)
Anesthesia Evaluation  Patient identified by MRN, date of birth, ID band Patient awake    Reviewed: Allergy & Precautions, H&P , NPO status , Patient's Chart, lab work & pertinent test results, reviewed documented beta blocker date and time   History of Anesthesia Complications (+) POST - OP SPINAL HEADACHE  Airway Mallampati: II  TM Distance: >3 FB Neck ROM: full    Dental no notable dental hx.    Pulmonary asthma ,    Pulmonary exam normal breath sounds clear to auscultation       Cardiovascular hypertension, Pt. on medications + dysrhythmias Atrial Fibrillation  Rhythm:regular Rate:Normal     Neuro/Psych negative neurological ROS  negative psych ROS   GI/Hepatic negative GI ROS, Neg liver ROS,   Endo/Other  negative endocrine ROS  Renal/GU negative Renal ROS  negative genitourinary   Musculoskeletal negative musculoskeletal ROS (+)   Abdominal   Peds negative pediatric ROS (+)  Hematology negative hematology ROS (+)   Anesthesia Other Findings Hypertension    Asthma   Reproductive/Obstetrics negative OB ROS                           Anesthesia Physical Anesthesia Plan  ASA: III  Anesthesia Plan: General   Post-op Pain Management:    Induction: Intravenous  PONV Risk Score and Plan:   Airway Management Planned: Oral ETT  Additional Equipment:   Intra-op Plan:   Post-operative Plan: Extubation in OR  Informed Consent: I have reviewed the patients History and Physical, chart, labs and discussed the procedure including the risks, benefits and alternatives for the proposed anesthesia with the patient or authorized representative who has indicated his/her understanding and acceptance.   Dental Advisory Given  Plan Discussed with: CRNA and Surgeon  Anesthesia Plan Comments: (Hx of ileus post up knee surg Toradol,Tylenol and Ketamine before narcotics Consider K+  replacement in PACU or on floor PO Recent afib  Great exercise tolerence EF good; may give  intraoperative BBlockers    )     Anesthesia Quick Evaluation

## 2017-02-09 NOTE — Care Management Note (Addendum)
Case Management Note  Patient Details  Name: Daniel Yu MRN: [id number] Date of Birth: 1932/06/22  Subjective/Objective:   Pt presented for Possible Cholecystitis. Post laparoscopic cholecystectomy. Pt is from home with support of wife. Plan will be to return home once stable.                  Action/Plan: No needs identified by CM at this time.   Expected Discharge Date:                  Expected Discharge Plan:  Home/Self Care  In-House Referral:  NA  Discharge planning Services  CM Consult  Post Acute Care Choice:  NA Choice offered to:  NA  DME Arranged:  N/A DME Agency:  NA  HH Arranged:  NA HH Agency:  NA  Status of Service:  Completed, signed off  If discussed at [place] of Stay Meetings, dates discussed:    Additional Comments: 1325 02-13-17 Jacqlyn Krauss, RN, BSN [phone] Benefits Check completed if the decision will be to use Lovenox Injections for home. CM will continue to monitor.   Per rep at [place]: auth required [id number]   Enoxoparin: $20 for 30 day supply, $59.16 90 day supply   [facility]    Bethena Roys, RN 02/09/2017, 2:38 PM

## 2017-02-09 NOTE — Progress Notes (Signed)
Subjective/Chief Complaint: Abdominal pain improved Awaiting cardiac clearance - ECHO just performed  Objective: Vital signs in last 24 hours: Temp:  [97.6 F (36.4 C)-99 F (37.2 C)] 99 F (37.2 C) (06/22 0740) Pulse Rate:  [30-139] 99 (06/22 0740) Resp:  [13-28] 27 (06/22 0434) BP: (92-123)/(64-84) 118/79 (06/22 0740) SpO2:  [93 %-100 %] 97 % (06/22 0740) Weight:  [83.9 kg (185 lb)] 83.9 kg (185 lb) (06/22 0434) Last BM Date: 02/08/17  Intake/Output from previous day: 06/21 0701 - 06/22 0700 In: 4241.3 [I.V.:2781.3; IV Piggyback:1460] Out: -  Intake/Output this shift: No intake/output data recorded.  General appearance: alert, cooperative and no distress Cardio: irregular, tachycardic GI: soft, non-tender; bowel sounds normal; no masses,  no organomegaly  Lab Results:   Recent Labs  02/08/17 1656 02/09/17 0327  WBC 14.7* 11.7*  HGB 12.2* 11.1*  HCT 36.4* 32.7*  PLT 153 155   BMET  Recent Labs  02/08/17 0516 02/08/17 0535 02/08/17 0805 02/09/17 0327  NA 134* 136  --  135  K 3.0* 3.1*  --  3.2*  CL 106 102  --  110  CO2 17*  --   --  21*  GLUCOSE 104* 102*  --  113*  BUN 15 16  --  13  CREATININE 1.01 0.80 0.99 0.81  CALCIUM 7.7*  --   --  7.6*   PT/INR No results for input(s): LABPROT, INR in the last 72 hours. ABG No results for input(s): PHART, HCO3 in the last 72 hours.  Invalid input(s): PCO2, PO2  Studies/Results: Ct Abdomen Pelvis W Contrast  Result Date: 02/08/2017 CLINICAL DATA:  Diffuse abdominal cramping.  Sepsis. EXAM: CT ABDOMEN AND PELVIS WITH CONTRAST TECHNIQUE: Multidetector CT imaging of the abdomen and pelvis was performed using the standard protocol following bolus administration of intravenous contrast. CONTRAST:  160mL ISOVUE-300 IOPAMIDOL (ISOVUE-300) INJECTION 61% COMPARISON:  Radiographs earlier this day.  CT 01/03/2016 FINDINGS: Lower chest: Coronary artery calcifications. Lower lobe bronchial thickening. Dependent lower  lobe densities, atelectasis on the left, indeterminate on the right. Hepatobiliary: Gallbladder is distended with gallbladder wall thickening and adjacent pericholecystic fluid. No calcified gallstones. There is no biliary dilatation. No focal hepatic lesion. Pancreas: No ductal dilatation or inflammation. Spleen: Normal in size without focal abnormality. Adrenals/Urinary Tract: Normal adrenal glands. No hydronephrosis. Small volume of right perinephric fluid may be sympathetic. Symmetric renal excretion on delayed phase imaging. Urinary bladder is nondistended. Stomach/Bowel: The stomach is decompressed. Fluid within upper abdominal and lower pelvic bowel lobes which are nondilated. Minimal liquid stool in the cecum and transverse colon. Formed stool in the more distal colon. No colonic inflammation. There is wall thickening of the cecum and ascending colon. The dilated small bowel loop in radiograph is not present. The appendix is not confidently visualized. Vascular/Lymphatic: Aortic and branch atherosclerosis without aneurysm. No adenopathy. Reproductive: Prominent prostate gland. Other: Small volume of free fluid tracks in the pelvis. No free air. No intra-abdominal abscess. Musculoskeletal: Degenerative change throughout the spine and in both hips. There are no acute or suspicious osseous abnormalities. IMPRESSION: 1. Findings suspicious for acute cholecystitis. No calcified gallstones are seen, however there is gallbladder distention, wall thickening and pericholecystic fluid. 2. Colonic wall thickening with liquid stool in the ascending colon, may be reactive versus less likely primary colitis. Fluid-filled nondilated small bowel, mild ileus. 3. Minimal right perinephric fluid is likely reactive. No evidence pyelonephritis. 4. Dependent lower lobes densities in the lung bases, atelectasis on the left, indeterminate on the  right for atelectasis versus pneumonia. 5. Aortic atherosclerosis. Electronically  Signed   By: Jeb Levering M.D.   On: 02/08/2017 06:33   Dg Chest Port 1 View  Result Date: 02/09/2017 CLINICAL DATA:  Wheezing. EXAM: PORTABLE CHEST 1 VIEW COMPARISON:  02/08/2017. FINDINGS: Mediastinum and hilar structures normal. Cardiomegaly with normal pulmonary vascularity. Low lung volumes with mild basilar atelectasis. Mild infiltrate right lung base cannot be excluded. Stable calcified density left apex, most likely calcified granuloma. No pleural effusion or pneumothorax . IMPRESSION: 1. Cardiomegaly with normal pulmonary vascularity. 2. Low lung volumes with mild bibasilar atelectasis. Mild infiltrate right lung base cannot be excluded . Electronically Signed   By: Marcello Moores  Register   On: 02/09/2017 07:38   Dg Abd Acute W/chest  Result Date: 02/08/2017 CLINICAL DATA:  Abdominal cramping and lower abdominal pain. Hypotension. EXAM: DG ABDOMEN ACUTE W/ 1V CHEST COMPARISON:  Radiographs and CT May 2017 FINDINGS: The cardiomediastinal contours are normal. Atherosclerosis of the thoracic aorta. Mild bibasilar atelectasis. Mild elevation of right hemidiaphragm. There is no free intra-abdominal air. Dilated small bowel in the central abdomen measures up to 4.6 cm. Air within normal caliber transverse colon. Small volume of distal colonic stool. There is otherwise relative paucity of bowel gas. No radiopaque calculi. No acute osseous abnormalities are seen. Degenerative change throughout spine. IMPRESSION: Dilated central small bowel. Air within the colon. Findings may reflect ileus or enteritis, overall nonspecific bowel gas pattern. Mild bibasilar atelectasis.  Thoracic aortic atherosclerosis. Electronically Signed   By: Jeb Levering M.D.   On: 02/08/2017 04:57   US Abdomen Limited Ruq  Result Date: 02/08/2017 CLINICAL DATA:  83 year old male with right upper quadrant abdominal pain for 2 days. EXAM: ULTRASOUND ABDOMEN LIMITED RIGHT UPPER QUADRANT COMPARISON:  02/08/2017 CT FINDINGS:  Gallbladder: A large amount of gallbladder sludge noted. Gallbladder wall thickening and pericholecystic fluid noted. No definite cholelithiasis or sonographic Murphy's sign. Common bile duct: Diameter: 3 mm. There is no evidence of intrahepatic or extrahepatic biliary dilatation. Liver: No focal lesion identified. Within normal limits in parenchymal echogenicity. A small amount of free fluid in Morison's pouch noted. IMPRESSION: Gallbladder wall thickening with pericholecystic fluid and large amount of gallbladder sludge suspicious for acute cholecystitis in conjunction with CT findings. No biliary dilatation. Electronically Signed   By: Margarette Canada M.D.   On: 02/08/2017 07:42    Anti-infectives: Anti-infectives    Start     Dose/Rate Route Frequency Ordered Stop   02/08/17 1800  vancomycin (VANCOCIN) IVPB 750 mg/150 ml premix     750 mg 150 mL/hr over 60 Minutes Intravenous Every 12 hours 02/08/17 1145     02/08/17 1300  piperacillin-tazobactam (ZOSYN) IVPB 3.375 g     3.375 g 12.5 mL/hr over 240 Minutes Intravenous Every 8 hours 02/08/17 1145     02/08/17 0900  metroNIDAZOLE (FLAGYL) IVPB 500 mg     500 mg 100 mL/hr over 60 Minutes Intravenous Every 8 hours 02/08/17 0737     02/08/17 0445  vancomycin (VANCOCIN) IVPB 1000 mg/200 mL premix     1,000 mg 200 mL/hr over 60 Minutes Intravenous  Once 02/08/17 0430 02/08/17 0633   02/08/17 0445  piperacillin-tazobactam (ZOSYN) IVPB 3.375 g     3.375 g 100 mL/hr over 30 Minutes Intravenous  Once 02/08/17 0430 02/08/17 0525      Assessment/Plan: If ECHO shows normal LV function and Cardiology clears the patient, will proceed with laparoscopic cholecystectomy later today.      LOS: 1  day    Mardell Suttles K. 02/09/2017

## 2017-02-09 NOTE — Progress Notes (Signed)
Progress Note  Patient Name: Daniel Yu Date of Encounter: 02/09/2017  Primary Cardiologist: Dr Stanford Breed  Subjective   Denies CP or dyspnea; abdominal pain improved  Inpatient Medications    Scheduled Meds: . budesonide (PULMICORT) nebulizer solution  0.25 mg Nebulization BID  . enoxaparin (LOVENOX) injection  40 mg Subcutaneous Q24H  . sodium chloride flush  3 mL Intravenous Q12H   Continuous Infusions: . sodium chloride 125 mL/hr at 02/09/17 0604  . diltiazem (CARDIZEM) infusion 7.5 mg/hr (02/09/17 0503)  . metronidazole Stopped (02/09/17 0207)  . piperacillin-tazobactam (ZOSYN)  IV 3.375 g (02/09/17 0504)  . vancomycin Stopped (02/09/17 0604)   PRN Meds: acetaminophen **OR** acetaminophen, albuterol, HYDROcodone-acetaminophen, hydroxypropyl methylcellulose / hypromellose, ketorolac, ondansetron **OR** ondansetron (ZOFRAN) IV   Vital Signs    Vitals:   02/08/17 2300 02/09/17 0033 02/09/17 0434 02/09/17 0740  BP: 107/64 116/77 123/77 118/79  Pulse: 89 (!) 104 (!) 102 99  Resp:  (!) 28 (!) 27   Temp:   97.6 F (36.4 C) 99 F (37.2 C)  TempSrc:   Oral Oral  SpO2: 97% 97% 96% 97%  Weight:   83.9 kg (185 lb)   Height:        Intake/Output Summary (Last 24 hours) at 02/09/17 0812 Last data filed at 02/09/17 0700  Gross per 24 hour  Intake          4241.25 ml  Output                0 ml  Net          4241.25 ml   Filed Weights   02/08/17 0320 02/09/17 0434  Weight: 83.9 kg (185 lb) 83.9 kg (185 lb)    Telemetry    Atrial fibrillation with mildly elevated rate- Personally Reviewed  Physical Exam   GEN: No acute distress.   Neck: No JVD Cardiac: irregular and tachycardic Respiratory: Clear to auscultation bilaterally. GI: Soft, non-distended, RUQ pain with palpation MS: No edema Neuro:  Nonfocal  Psych: Normal affect   Labs    Chemistry Recent Labs Lab 02/08/17 0516 02/08/17 0535 02/08/17 0805 02/09/17 0327  NA 134* 136  --  135  K 3.0*  3.1*  --  3.2*  CL 106 102  --  110  CO2 17*  --   --  21*  GLUCOSE 104* 102*  --  113*  BUN 15 16  --  13  CREATININE 1.01 0.80 0.99 0.81  CALCIUM 7.7*  --   --  7.6*  PROT 5.6*  --   --  5.2*  ALBUMIN 2.7*  --   --  2.4*  AST 28  --   --  26  ALT 14*  --   --  22  ALKPHOS 51  --   --  51  BILITOT 1.3*  --   --  0.8  GFRNONAA >60  --  >60 >60  GFRAA >60  --  >60 >60  ANIONGAP 11  --   --  4*     Hematology Recent Labs Lab 02/08/17 0805 02/08/17 1656 02/09/17 0327  WBC 14.7* 14.7* 11.7*  RBC 3.66* 3.74* 3.36*  HGB 11.9* 12.2* 11.1*  HCT 35.1* 36.4* 32.7*  MCV 95.9 97.3 97.3  MCH 32.5 32.6 33.0  MCHC 33.9 33.5 33.9  RDW 13.2 13.5 13.6  PLT 164 153 155    Cardiac Enzymes Recent Labs Lab 02/08/17 1656  TROPONINI 1.07*    Recent Labs Lab 02/08/17  0424  TROPIPOC 0.03     Radiology    Ct Abdomen Pelvis W Contrast  Result Date: 02/08/2017 CLINICAL DATA:  Diffuse abdominal cramping.  Sepsis. EXAM: CT ABDOMEN AND PELVIS WITH CONTRAST TECHNIQUE: Multidetector CT imaging of the abdomen and pelvis was performed using the standard protocol following bolus administration of intravenous contrast. CONTRAST:  152mL ISOVUE-300 IOPAMIDOL (ISOVUE-300) INJECTION 61% COMPARISON:  Radiographs earlier this day.  CT 01/03/2016 FINDINGS: Lower chest: Coronary artery calcifications. Lower lobe bronchial thickening. Dependent lower lobe densities, atelectasis on the left, indeterminate on the right. Hepatobiliary: Gallbladder is distended with gallbladder wall thickening and adjacent pericholecystic fluid. No calcified gallstones. There is no biliary dilatation. No focal hepatic lesion. Pancreas: No ductal dilatation or inflammation. Spleen: Normal in size without focal abnormality. Adrenals/Urinary Tract: Normal adrenal glands. No hydronephrosis. Small volume of right perinephric fluid may be sympathetic. Symmetric renal excretion on delayed phase imaging. Urinary bladder is nondistended.  Stomach/Bowel: The stomach is decompressed. Fluid within upper abdominal and lower pelvic bowel lobes which are nondilated. Minimal liquid stool in the cecum and transverse colon. Formed stool in the more distal colon. No colonic inflammation. There is wall thickening of the cecum and ascending colon. The dilated small bowel loop in radiograph is not present. The appendix is not confidently visualized. Vascular/Lymphatic: Aortic and branch atherosclerosis without aneurysm. No adenopathy. Reproductive: Prominent prostate gland. Other: Small volume of free fluid tracks in the pelvis. No free air. No intra-abdominal abscess. Musculoskeletal: Degenerative change throughout the spine and in both hips. There are no acute or suspicious osseous abnormalities. IMPRESSION: 1. Findings suspicious for acute cholecystitis. No calcified gallstones are seen, however there is gallbladder distention, wall thickening and pericholecystic fluid. 2. Colonic wall thickening with liquid stool in the ascending colon, may be reactive versus less likely primary colitis. Fluid-filled nondilated small bowel, mild ileus. 3. Minimal right perinephric fluid is likely reactive. No evidence pyelonephritis. 4. Dependent lower lobes densities in the lung bases, atelectasis on the left, indeterminate on the right for atelectasis versus pneumonia. 5. Aortic atherosclerosis. Electronically Signed   By: Jeb Levering M.D.   On: 02/08/2017 06:33   Dg Chest Port 1 View  Result Date: 02/09/2017 CLINICAL DATA:  Wheezing. EXAM: PORTABLE CHEST 1 VIEW COMPARISON:  02/08/2017. FINDINGS: Mediastinum and hilar structures normal. Cardiomegaly with normal pulmonary vascularity. Low lung volumes with mild basilar atelectasis. Mild infiltrate right lung base cannot be excluded. Stable calcified density left apex, most likely calcified granuloma. No pleural effusion or pneumothorax . IMPRESSION: 1. Cardiomegaly with normal pulmonary vascularity. 2. Low lung  volumes with mild bibasilar atelectasis. Mild infiltrate right lung base cannot be excluded . Electronically Signed   By: Marcello Moores  Register   On: 02/09/2017 07:38   Dg Abd Acute W/chest  Result Date: 02/08/2017 CLINICAL DATA:  Abdominal cramping and lower abdominal pain. Hypotension. EXAM: DG ABDOMEN ACUTE W/ 1V CHEST COMPARISON:  Radiographs and CT May 2017 FINDINGS: The cardiomediastinal contours are normal. Atherosclerosis of the thoracic aorta. Mild bibasilar atelectasis. Mild elevation of right hemidiaphragm. There is no free intra-abdominal air. Dilated small bowel in the central abdomen measures up to 4.6 cm. Air within normal caliber transverse colon. Small volume of distal colonic stool. There is otherwise relative paucity of bowel gas. No radiopaque calculi. No acute osseous abnormalities are seen. Degenerative change throughout spine. IMPRESSION: Dilated central small bowel. Air within the colon. Findings may reflect ileus or enteritis, overall nonspecific bowel gas pattern. Mild bibasilar atelectasis.  Thoracic aortic atherosclerosis. Electronically  Signed   By: Jeb Levering M.D.   On: 02/08/2017 04:57   US Abdomen Limited Ruq  Result Date: 02/08/2017 CLINICAL DATA:  83 year old male with right upper quadrant abdominal pain for 2 days. EXAM: ULTRASOUND ABDOMEN LIMITED RIGHT UPPER QUADRANT COMPARISON:  02/08/2017 CT FINDINGS: Gallbladder: A large amount of gallbladder sludge noted. Gallbladder wall thickening and pericholecystic fluid noted. No definite cholelithiasis or sonographic Murphy's sign. Common bile duct: Diameter: 3 mm. There is no evidence of intrahepatic or extrahepatic biliary dilatation. Liver: No focal lesion identified. Within normal limits in parenchymal echogenicity. A small amount of free fluid in Morison's pouch noted. IMPRESSION: Gallbladder wall thickening with pericholecystic fluid and large amount of gallbladder sludge suspicious for acute cholecystitis in conjunction  with CT findings. No biliary dilatation. Electronically Signed   By: Margarette Canada M.D.   On: 02/08/2017 07:42    Patient Profile     83 y.o. male admitted with abdominal pain and cholecystitis. Also noted to be in atrial fibrillation with rapid ventricular response. Cardiology was asked to evaluate preoperatively.  Assessment & Plan   1 Newly diagnosed atrial fibrillation-Patient remains in atrial fibrillation. Rate minimally elevated. Increase Cardizem to 10 mg/hr and adjust as needed. Await echocardiogram for LV function. TSH normal. Embolic risk factors include hypertension and age greater than 2. CHADSvasc 3. We will add apixaban once hemostasis is achieved following cholecystectomy. We will decide about cardioversion in the future based on symptoms.  2 preoperative evaluation prior to cholecystectomy-patient previously had excellent functional capacity and has not had chest pain or dyspnea. When he presented he was in atrial fibrillation with elevated rate and ST depression in the inferolateral leads. He was not having chest pain. A troponin was checked and was mildly elevated at 1.07. Likely demand ischemia. We will await results of echocardiogram for LV function and wall motion. I will repeat his troponin now. We will make a decision concerning further ischemia evaluation prior to cholecystectomy based on above results.  3 cholecystitis-for cholecystectomy. Management per surgery.  4 hypertension-blood pressure is controlled. Continue Cardizem.  5 elevated troponin-likely demand ischemia. Plan as outlined above. Add aspirin.  Signed, Kirk Ruths, MD  02/09/2017, 8:12 AM

## 2017-02-10 DIAGNOSIS — K819 Cholecystitis, unspecified: Secondary | ICD-10-CM

## 2017-02-10 DIAGNOSIS — I480 Paroxysmal atrial fibrillation: Secondary | ICD-10-CM

## 2017-02-10 DIAGNOSIS — A408 Other streptococcal sepsis: Secondary | ICD-10-CM

## 2017-02-10 LAB — COMPREHENSIVE METABOLIC PANEL
ALK PHOS: 64 U/L (ref 38–126)
ALT: 43 U/L (ref 17–63)
ANION GAP: 8 (ref 5–15)
AST: 54 U/L — ABNORMAL HIGH (ref 15–41)
Albumin: 2.4 g/dL — ABNORMAL LOW (ref 3.5–5.0)
BILIRUBIN TOTAL: 0.3 mg/dL (ref 0.3–1.2)
BUN: 14 mg/dL (ref 6–20)
CALCIUM: 7.5 mg/dL — AB (ref 8.9–10.3)
CO2: 17 mmol/L — ABNORMAL LOW (ref 22–32)
Chloride: 109 mmol/L (ref 101–111)
Creatinine, Ser: 0.78 mg/dL (ref 0.61–1.24)
GFR calc Af Amer: >60 mL/min (ref >60)
Glucose, Bld: 166 mg/dL — ABNORMAL HIGH (ref 65–99)
Potassium: 3.6 mmol/L (ref 3.5–5.1)
SODIUM: 134 mmol/L — AB (ref 135–145)
TOTAL PROTEIN: 5.6 g/dL — AB (ref 6.5–8.1)

## 2017-02-10 LAB — CBC WITH DIFFERENTIAL/PLATELET
Basophils Absolute: 0 10*3/uL (ref 0.0–0.1)
Basophils Relative: 0 %
EOS ABS: 0 10*3/uL (ref 0.0–0.7)
Eosinophils Relative: 0 %
HEMATOCRIT: 33 % — AB (ref 39.0–52.0)
HEMOGLOBIN: 11.4 g/dL — AB (ref 13.0–17.0)
LYMPHS ABS: 0.4 10*3/uL — AB (ref 0.7–4.0)
Lymphocytes Relative: 3 %
MCH: 33 pg (ref 26.0–34.0)
MCHC: 34.5 g/dL (ref 30.0–36.0)
MCV: 95.7 fL (ref 78.0–100.0)
MONOS PCT: 5 %
Monocytes Absolute: 0.5 10*3/uL (ref 0.1–1.0)
NEUTROS PCT: 92 %
Neutro Abs: 11.1 10*3/uL — ABNORMAL HIGH (ref 1.7–7.7)
Platelets: 156 10*3/uL (ref 150–400)
RBC: 3.45 MIL/uL — ABNORMAL LOW (ref 4.22–5.81)
RDW: 13.5 % (ref 11.5–15.5)
WBC: 12 10*3/uL — ABNORMAL HIGH (ref 4.0–10.5)

## 2017-02-10 LAB — TROPONIN I: TROPONIN I: 0.55 ng/mL — AB (ref <0.03)

## 2017-02-10 LAB — MAGNESIUM: Magnesium: 2 mg/dL (ref 1.7–2.4)

## 2017-02-10 LAB — PROTIME-INR
INR: 1.08
PROTHROMBIN TIME: 14.1 s (ref 11.4–15.2)

## 2017-02-10 LAB — HEPARIN LEVEL (UNFRACTIONATED)

## 2017-02-10 MED ORDER — LEVALBUTEROL HCL 0.63 MG/3ML IN NEBU: 0.6300 mg | INHALATION_SOLUTION | RESPIRATORY_TRACT | Status: DC | PRN

## 2017-02-10 MED ORDER — WARFARIN VIDEO: Freq: Once | Status: DC

## 2017-02-10 MED ORDER — POTASSIUM CHLORIDE CRYS ER 20 MEQ PO TBCR
40.0000 meq | EXTENDED_RELEASE_TABLET | Freq: Once | ORAL | Status: AC
  Administered 2017-02-10: 40 meq via ORAL
  Filled 2017-02-10: qty 2

## 2017-02-10 MED ORDER — HEPARIN (PORCINE) IN NACL 100-0.45 UNIT/ML-% IJ SOLN
1200.0000 [IU]/h | INTRAMUSCULAR | Status: DC
  Administered 2017-02-10: 1200 [IU]/h via INTRAVENOUS
  Filled 2017-02-10: qty 250

## 2017-02-10 MED ORDER — POLYETHYLENE GLYCOL 3350 17 G PO PACK
17.0000 g | PACK | Freq: Once | ORAL | Status: AC
  Administered 2017-02-10: 17 g via ORAL
  Filled 2017-02-10: qty 1

## 2017-02-10 MED ORDER — HEPARIN (PORCINE) IN NACL 100-0.45 UNIT/ML-% IJ SOLN
1850.0000 [IU]/h | INTRAMUSCULAR | Status: DC
  Administered 2017-02-11: 1850 [IU]/h via INTRAVENOUS
  Administered 2017-02-11: 1650 [IU]/h via INTRAVENOUS
  Administered 2017-02-12 – 2017-02-14 (×5): 1850 [IU]/h via INTRAVENOUS
  Filled 2017-02-10 (×8): qty 250

## 2017-02-10 MED ORDER — WARFARIN SODIUM 5 MG PO TABS
5.0000 mg | ORAL_TABLET | Freq: Once | ORAL | Status: AC
  Administered 2017-02-10: 5 mg via ORAL
  Filled 2017-02-10: qty 1

## 2017-02-10 MED ORDER — WARFARIN - PHARMACIST DOSING INPATIENT
Freq: Every day | Status: DC
  Administered 2017-02-11 – 2017-02-12 (×2)

## 2017-02-10 MED ORDER — COUMADIN BOOK
Freq: Once | Status: AC
  Administered 2017-02-10: 20:00:00
  Filled 2017-02-10 (×2): qty 1

## 2017-02-10 NOTE — Progress Notes (Signed)
ANTICOAGULATION CONSULT NOTE - Follow Up Consult  Pharmacy Consult for Heparin Indication: atrial fibrillation  No Known Allergies  Patient Measurements: Height: 5\' 10"  (177.8 cm) Weight: 186 lb (84.4 kg) IBW/kg (Calculated) : 73  Vital Signs: Temp: 98.2 F (36.8 C) (06/23 1400) Temp Source: Oral (06/23 1400) BP: 123/72 (06/23 1740) Pulse Rate: 84 (06/23 1400)  Labs:  Recent Labs  02/08/17 0805  02/08/17 1656 02/09/17 0327 02/09/17 1014 02/09/17 1812 02/10/17 0047 02/10/17 0955 02/10/17 1815  HGB 11.9*  --  12.2* 11.1*  --   --  11.4*  --   --   HCT 35.1*  --  36.4* 32.7*  --   --  33.0*  --   --   PLT 164  --  153 155  --   --  156  --   --   LABPROT  --   --   --   --   --   --   --  14.1  --   INR  --   --   --   --   --   --   --  1.08  --   HEPARINUNFRC  --   --   --   --   --   --   --   --  <0.10*  CREATININE 0.99  --   --  0.81  --   --  0.78  --   --   TROPONINI  --   < > 1.07*  --  0.93* 0.69* 0.55*  --   --   < > = values in this interval not displayed.  Estimated Creatinine Clearance: 73.5 mL/min (by C-G formula based on SCr of 0.78 mg/dL).   Medications:  Heparin @ 1200 units/hr  Assessment: 84yom started on heparin bridge to coumadin earlier today for new onset afib. Initial heparin level is undetectable. No issues with infusion.   Goal of Therapy:  Heparin level 0.3-0.7 units/ml Monitor platelets by anticoagulation protocol: Yes   Plan:  1) Increase heparin to 1400 units/hr 2) Next heparin level with AM labs  Deboraha Sprang 02/10/2017,7:16 PM

## 2017-02-10 NOTE — Progress Notes (Signed)
ANTICOAGULATION CONSULT NOTE - Initial Consult  Pharmacy Consult for Warfarin with Heparin Bridge Indication: atrial fibrillation  No Known Allergies  Patient Measurements: Height: 5\' 10"  (177.8 cm) Weight: 186 lb (84.4 kg) IBW/kg (Calculated) : 73 Heparin Dosing Weight: 84kg  Vital Signs: Temp: 98.1 F (36.7 C) (06/23 0343) BP: 130/76 (06/23 0731) Pulse Rate: 89 (06/23 0731)  Labs:  Recent Labs  02/08/17 0805  02/08/17 1656 02/09/17 0327 02/09/17 1014 02/09/17 1812 02/10/17 0047  HGB 11.9*  --  12.2* 11.1*  --   --  11.4*  HCT 35.1*  --  36.4* 32.7*  --   --  33.0*  PLT 164  --  153 155  --   --  156  CREATININE 0.99  --   --  0.81  --   --  0.78  TROPONINI  --   < > 1.07*  --  0.93* 0.69* 0.55*  < > = values in this interval not displayed.  Estimated Creatinine Clearance: 73.5 mL/min (by C-G formula based on SCr of 0.78 mg/dL).   Medical History: Past Medical History:  Diagnosis Date  . Arthritis    right knee oa  . Asthma   . Carpal tunnel syndrome    BILATERAL  . Hypertension   . SBO (small bowel obstruction) ([place]) 12/2015    Medications:  Prescriptions Prior to Admission  Medication Sig Dispense Refill Last Dose  . acetaminophen (TYLENOL) 500 MG tablet Take 1,000 mg by mouth every 6 (six) hours as needed for mild pain.   unk  . cetirizine (ZYRTEC) 10 MG tablet Take 10 mg by mouth daily.   02/07/2017 at Unknown time  . chlorproMAZINE (THORAZINE) 10 MG tablet Take 1-2.5 tablets (10-25 mg total) by mouth 3 (three) times daily as needed for hiccoughs. 20 tablet 0 unk  . fluticasone (FLOVENT HFA) 110 MCG/ACT inhaler Inhale 1 puff into the lungs 2 (two) times daily.    02/07/2017 at Unknown time  . ondansetron (ZOFRAN) 4 MG tablet Take 1 tablet (4 mg total) by mouth every 6 (six) hours as needed for nausea. 20 tablet 0 unk  . valsartan-hydrochlorothiazide (DIOVAN-HCT) 160-12.5 MG per tablet Take 1 tablet by mouth every morning.   02/07/2017 at Unknown time    Scheduled:  . aspirin EC  81 mg Oral Daily  . budesonide (PULMICORT) nebulizer solution  0.25 mg Nebulization BID  . polyethylene glycol  17 g Oral Once  . sodium chloride flush  3 mL Intravenous Q12H   Infusions:  . sodium chloride 125 mL/hr at 02/10/17 0556  . diltiazem (CARDIZEM) infusion 15 mg/hr (02/10/17 0455)  . metronidazole Stopped (02/10/17 0128)  . piperacillin-tazobactam (ZOSYN)  IV Stopped (02/10/17 0857)  . vancomycin Stopped (02/10/17 0556)   PRN: acetaminophen **OR** acetaminophen, albuterol, HYDROcodone-acetaminophen, hydroxypropyl methylcellulose / hypromellose, ketorolac, morphine injection, ondansetron **OR** ondansetron (ZOFRAN) IV  Assessment: Patient is a 83 year old male admitted w/ constipation and found to be in A Fib w/ RVR (new) and cholecystitis. CHADS-VASc = 3 (age, HTN). No anticoagulation PTA. Patient is s/p laparoscopic cholecystectomy on 6/22, and was cleared for anticoagulation by surgery. Patient will being warfarin with heparin bridge while inpatient. Per cards note, wanted reversible agents while inpatient, may do DOAC on discharge.   Baseline CBC with hemoglobin low/stable and platelet count within normal limits. No bleeding noted. First dose of warfarin will be 6/23. Noted that patient remains on antibiotics, which can increase warfarin effects.    Goal of Therapy:  INR  2-3 Heparin level 0.3-0.7 units/ml Monitor platelets by anticoagulation protocol: Yes   Plan:  Heparin 1200 units/hr IV, no bolus Warfarin 5mg  PO x1 tonight F/U heparin level in 8 hours Daily CBC and INR Monitor signs/symptoms of bleeding   Demetrius Charity, PharmD Acute Care Pharmacy Resident  Pager: [phone] 02/10/2017

## 2017-02-10 NOTE — Progress Notes (Signed)
1 Day Post-Op   Subjective/Chief Complaint: Incisional soreness  Sore at umbilicus, but improved in RUQ Tolerating full liquids without nausea No flatus - mildly distended   Objective: Vital signs in last 24 hours: Temp:  [97.1 F (36.2 C)-98.1 F (36.7 C)] 98.1 F (36.7 C) (06/23 0343) Pulse Rate:  [68-120] 89 (06/23 0731) Resp:  [15-25] 22 (06/23 0343) BP: (115-145)/(68-98) 130/76 (06/23 0731) SpO2:  [92 %-95 %] 93 % (06/23 0731) Weight:  [84.4 kg (186 lb)] 84.4 kg (186 lb) (06/23 0343) Last BM Date: 02/09/17  Intake/Output from previous day: 06/22 0701 - 06/23 0700 In: 2552.9 [P.O.:60; I.V.:2042.9; IV Piggyback:450] Out: 55 [Drains:55] Intake/Output this shift: No intake/output data recorded.  General appearance: alert, cooperative and no distress Resp: clear to auscultation bilaterally Cardio: irregularly irregular rhythm GI: mildly distended; tender around umbilicus; dressings dry Drain with thin serosanguinous fluid  Lab Results:   Recent Labs  02/09/17 0327 02/10/17 0047  WBC 11.7* 12.0*  HGB 11.1* 11.4*  HCT 32.7* 33.0*  PLT 155 156   BMET  Recent Labs  02/09/17 0327 02/10/17 0047  NA 135 134*  K 3.2* 3.6  CL 110 109  CO2 21* 17*  GLUCOSE 113* 166*  BUN 13 14  CREATININE 0.81 0.78  CALCIUM 7.6* 7.5*   Hepatic Function Latest Ref Rng & Units 02/10/2017 02/09/2017 02/08/2017  Total Protein 6.5 - 8.1 g/dL 5.6(L) 5.2(L) 5.6(L)  Albumin 3.5 - 5.0 g/dL 2.4(L) 2.4(L) 2.7(L)  AST 15 - 41 U/L 54(H) 26 28  ALT 17 - 63 U/L 43 22 14(L)  Alk Phosphatase 38 - 126 U/L 64 51 51  Total Bilirubin 0.3 - 1.2 mg/dL 0.3 0.8 1.3(H)  Bilirubin, Direct 0.1 - 0.5 mg/dL - 0.1 -    PT/INR No results for input(s): LABPROT, INR in the last 72 hours. ABG No results for input(s): PHART, HCO3 in the last 72 hours.  Invalid input(s): PCO2, PO2  Studies/Results: Dg Cholangiogram Operative  Result Date: 02/09/2017 CLINICAL DATA:  83 year old male with a history of  cholelithiasis EXAM: INTRAOPERATIVE CHOLANGIOGRAM TECHNIQUE: Cholangiographic images from the C-arm fluoroscopic device were submitted for interpretation post-operatively. Please see the procedural report for the amount of contrast and the fluoroscopy time utilized. COMPARISON:  None. FINDINGS: Surgical instruments project over the upper abdomen. There is cannulation of the cystic duct/gallbladder neck, with antegrade infusion of contrast. Caliber of the extrahepatic ductal system within normal limits. No large filling defect identified. Free flow of contrast across the ampulla. IMPRESSION: Intraoperative cholangiogram demonstrates extrahepatic biliary ducts of unremarkable caliber, with no large filling defect identified. Free flow of contrast across the ampulla. Please refer to the dictated operative report for full details of intraoperative findings and procedure Electronically Signed   By: Corrie Mckusick D.O.   On: 02/09/2017 17:17   Dg Chest Port 1 View  Result Date: 02/09/2017 CLINICAL DATA:  Wheezing. EXAM: PORTABLE CHEST 1 VIEW COMPARISON:  02/08/2017. FINDINGS: Mediastinum and hilar structures normal. Cardiomegaly with normal pulmonary vascularity. Low lung volumes with mild basilar atelectasis. Mild infiltrate right lung base cannot be excluded. Stable calcified density left apex, most likely calcified granuloma. No pleural effusion or pneumothorax . IMPRESSION: 1. Cardiomegaly with normal pulmonary vascularity. 2. Low lung volumes with mild bibasilar atelectasis. Mild infiltrate right lung base cannot be excluded . Electronically Signed   By: Marcello Moores  Register   On: 02/09/2017 07:38    Anti-infectives: Anti-infectives    Start     Dose/Rate Route Frequency Ordered Stop  02/08/17 1800  vancomycin (VANCOCIN) IVPB 750 mg/150 ml premix     750 mg 150 mL/hr over 60 Minutes Intravenous Every 12 hours 02/08/17 1145     02/08/17 1300  piperacillin-tazobactam (ZOSYN) IVPB 3.375 g     3.375 g 12.5  mL/hr over 240 Minutes Intravenous Every 8 hours 02/08/17 1145     02/08/17 0900  metroNIDAZOLE (FLAGYL) IVPB 500 mg     500 mg 100 mL/hr over 60 Minutes Intravenous Every 8 hours 02/08/17 0737     02/08/17 0445  vancomycin (VANCOCIN) IVPB 1000 mg/200 mL premix     1,000 mg 200 mL/hr over 60 Minutes Intravenous  Once 02/08/17 0430 02/08/17 0633   02/08/17 0445  piperacillin-tazobactam (ZOSYN) IVPB 3.375 g     3.375 g 100 mL/hr over 30 Minutes Intravenous  Once 02/08/17 0430 02/08/17 0525      Assessment/Plan: Severe acute suppurative cholecystitis The patient will likely have some degree of ileus due to the intra-abdominal infection.  This should improve since the GB is removed.  May anticoagulate as needed - would not bolus with heparin Recheck labs in AM Encourage ambulation Leave drain for now   LOS: 2 days    Hazelene Doten K. 02/10/2017

## 2017-02-10 NOTE — Anesthesia Postprocedure Evaluation (Signed)
Anesthesia Post Note  Patient: Daniel Yu  Procedure(s) Performed: Procedure(s) (LRB): LAPAROSCOPIC CHOLECYSTECTOMY WITH INTRAOPERATIVE CHOLANGIOGRAM (N/A)     Patient location during evaluation: PACU Anesthesia Type: General Level of consciousness: awake and alert Pain management: pain level controlled Vital Signs Assessment: post-procedure vital signs reviewed and stable Respiratory status: spontaneous breathing, nonlabored ventilation, respiratory function stable and patient connected to nasal cannula oxygen Cardiovascular status: blood pressure returned to baseline and stable Postop Assessment: no signs of nausea or vomiting Anesthetic complications: no    Last Vitals:  Vitals:   02/10/17 0030 02/10/17 0343  BP: 124/76 115/75  Pulse: 78 78  Resp: 17 (!) 22  Temp: 36.7 C 36.7 C    Last Pain:  Vitals:   02/09/17 2115  TempSrc:   PainSc: 0-No pain                 Tiajuana Amass

## 2017-02-10 NOTE — Progress Notes (Signed)
[place] TEAM 1 - Stepdown/ICU TEAM  Daniel Yu  JHE:[id number] DOB: [birth year] DOA: 02/08/2017 PCP: Lavone Orn, MD    Brief Narrative:  83 y.o.M Hx HTN, Asthma, and SBO who presented to the ED w/ nausea andabdom pain. In the ED CT revealed inflamed bowel and gallbladder. Right upper quadrant ultrasound showed acute cholecystitis.  Subjective: The patient reports feeling abdominal bloating but has not yet passed gas or had a bowel movement.  He denies nausea or vomiting.  He reports his abdominal pain is mild to moderate but reasonably controlled.  He denies chest pain or shortness of breath.  Assessment & Plan:  Acute severe suppurative cholecystitis Status post laparoscopic cholecystectomy 6/22 - postop care per general surgery  Newly diagnosed atrial fibrillation with RVR Cardiology following - heparin to be transitioned to Coumadin with later consideration to Elrama as outpt   Elevated troponin Cardiology following - to have Lexi scan Myoview prior to discharge or possibly as outpt - trop peaked at 1.07 - asymtomatic   Hypokalemia Supplement to goal of 4.0 - magnesium is normal  Asthma Quiescent  HTN Blood pressure presently controlled  DVT prophylaxis: lovenox  Code Status: FULL CODE Family Communication: Spoke with wife at bedside  Disposition Plan:   Consultants:  General surgery [facility] cardiology  Procedures: 6/22 TTE - EF 45-50% - diffuse hypokinesis   Antimicrobials:  Flagyl 6/21 > Zosyn 6/20 > Vancomycin 6/20 > 6/22  Objective: Blood pressure 130/76, pulse 89, temperature 98.1 F (36.7 C), resp. rate (!) 22, height 5\' 10"  (1.778 m), weight 84.4 kg (186 lb), SpO2 93 %.  Intake/Output Summary (Last 24 hours) at 02/10/17 1325 Last data filed at 02/10/17 0556  Gross per 24 hour  Intake          2552.92 ml  Output               55 ml  Net          2497.92 ml   Filed Weights   02/08/17 0320 02/09/17 0434 02/10/17 0343  Weight: 83.9 kg (185 lb)  83.9 kg (185 lb) 84.4 kg (186 lb)    Examination: General: No acute respiratory distress Lungs: Clear to auscultation bilaterally without wheezes or crackles Cardiovascular: Irregularly irregular without appreciable murmur or gallop Abdomen: Mildly distended, soft, no rebound, mildly tender to palpation Extremities: No significant cyanosis, clubbing, or edema bilateral lower extremities  CBC:  Recent Labs Lab 02/08/17 0344 02/08/17 0535 02/08/17 0805 02/08/17 1656 02/09/17 0327 02/10/17 0047  WBC 9.6  --  14.7* 14.7* 11.7* 12.0*  NEUTROABS 9.2*  --   --   --   --  11.1*  HGB 12.7* 11.6* 11.9* 12.2* 11.1* 11.4*  HCT 36.9* 34.0* 35.1* 36.4* 32.7* 33.0*  MCV 94.9  --  95.9 97.3 97.3 95.7  PLT 200  --  164 153 155 185   Basic Metabolic Panel:  Recent Labs Lab 02/08/17 0516 02/08/17 0535 02/08/17 0805 02/08/17 1656 02/09/17 0327 02/10/17 0047  NA 134* 136  --   --  135 134*  K 3.0* 3.1*  --   --  3.2* 3.6  CL 106 102  --   --  110 109  CO2 17*  --   --   --  21* 17*  GLUCOSE 104* 102*  --   --  113* 166*  BUN 15 16  --   --  13 14  CREATININE 1.01 0.80 0.99  --  0.81 0.78  CALCIUM 7.7*  --   --   --  7.6* 7.5*  MG  --   --   --  1.9  --  2.0  PHOS  --   --   --  2.6  --   --    GFR: Estimated Creatinine Clearance: 73.5 mL/min (by C-G formula based on SCr of 0.78 mg/dL).  Liver Function Tests:  Recent Labs Lab 02/08/17 0516 02/09/17 0327 02/10/17 0047  AST 28 26 54*  ALT 14* 22 43  ALKPHOS 51 51 64  BILITOT 1.3* 0.8 0.3  PROT 5.6* 5.2* 5.6*  ALBUMIN 2.7* 2.4* 2.4*    Recent Labs Lab 02/08/17 0515  LIPASE 18   Coagulation Profile:  Recent Labs Lab 02/10/17 0955  INR 1.08    Cardiac Enzymes:  Recent Labs Lab 02/08/17 1656 02/09/17 1014 02/09/17 1812 02/10/17 0047  TROPONINI 1.07* 0.93* 0.69* 0.55*    HbA1C: Hgb A1c MFr Bld  Date/Time Value Ref Range Status  01/03/2016 06:08 PM 5.4 4.8 - 5.6 % Final    Comment:    (NOTE)          Pre-diabetes: 5.7 - 6.4         Diabetes: >6.4         Glycemic control for adults with diabetes: <7.0     Recent Results (from the past 240 hour(s))  Blood Culture (routine x 2)     Status: None (Preliminary result)   Collection Time: 02/08/17  4:10 AM  Result Value Ref Range Status   Specimen Description BLOOD RIGHT HAND  Final   Special Requests   Final    BOTTLES DRAWN AEROBIC AND ANAEROBIC Blood Culture adequate volume   Culture NO GROWTH 2 DAYS  Final   Report Status PENDING  Incomplete  Blood Culture (routine x 2)     Status: None (Preliminary result)   Collection Time: 02/08/17  4:21 AM  Result Value Ref Range Status   Specimen Description BLOOD LEFT HAND  Final   Special Requests   Final    BOTTLES DRAWN AEROBIC AND ANAEROBIC Blood Culture adequate volume   Culture NO GROWTH 2 DAYS  Final   Report Status PENDING  Incomplete  Urine culture     Status: None   Collection Time: 02/08/17  5:23 AM  Result Value Ref Range Status   Specimen Description URINE, CATHETERIZED  Final   Special Requests NONE  Final   Culture NO GROWTH  Final   Report Status 02/09/2017 FINAL  Final  Surgical pcr screen     Status: None   Collection Time: 02/08/17  2:09 PM  Result Value Ref Range Status   MRSA, PCR NEGATIVE NEGATIVE Final   Staphylococcus aureus NEGATIVE NEGATIVE Final    Comment:        The Xpert SA Assay (FDA approved for NASAL specimens in patients over 72 years of age), is one component of a comprehensive surveillance program.  Test performance has been validated by [facility] for patients greater than or equal to 88 year old. It is not intended to diagnose infection nor to guide or monitor treatment.      Scheduled Meds: . aspirin EC  81 mg Oral Daily  . budesonide (PULMICORT) nebulizer solution  0.25 mg Nebulization BID  . sodium chloride flush  3 mL Intravenous Q12H  . warfarin  5 mg Oral ONCE-1800  . Warfarin - Pharmacist Dosing Inpatient   Does not apply  [id number]  LOS: 2 days   Cherene Altes, MD [facility] Office  [phone] Pager - Text Page per Amion as per below:  On-Call/Text Page:      [email or web address]      password [email or web address]  If 7PM-7AM, please contact night-coverage [email or web address] Password [email or web address] 02/10/2017, 1:25 PM

## 2017-02-10 NOTE — Progress Notes (Signed)
Progress Note  Patient Name: Daniel Yu Date of Encounter: 02/10/2017  Primary Cardiologist: Stanford Breed  Subjective   Abdomen still sore has not passed gas taking mostly liquids and a bit of grits  Inpatient Medications    Scheduled Meds: . aspirin EC  81 mg Oral Daily  . budesonide (PULMICORT) nebulizer solution  0.25 mg Nebulization BID  . enoxaparin (LOVENOX) injection  40 mg Subcutaneous Q24H  . sodium chloride flush  3 mL Intravenous Q12H   Continuous Infusions: . sodium chloride 125 mL/hr at 02/10/17 0556  . diltiazem (CARDIZEM) infusion 15 mg/hr (02/10/17 0455)  . metronidazole Stopped (02/10/17 0128)  . piperacillin-tazobactam (ZOSYN)  IV Stopped (02/10/17 0857)  . vancomycin Stopped (02/10/17 0556)   PRN Meds: acetaminophen **OR** acetaminophen, albuterol, HYDROcodone-acetaminophen, hydroxypropyl methylcellulose / hypromellose, ketorolac, morphine injection, ondansetron **OR** ondansetron (ZOFRAN) IV   Vital Signs    Vitals:   02/09/17 2210 02/10/17 0030 02/10/17 0343 02/10/17 0731  BP:  124/76 115/75 130/76  Pulse: 68 78 78 89  Resp: (!) 25 17 (!) 22   Temp:  98 F (36.7 C) 98.1 F (36.7 C)   TempSrc:      SpO2: 95% 95% 94% 93%  Weight:   84.4 kg (186 lb)   Height:        Intake/Output Summary (Last 24 hours) at 02/10/17 0842 Last data filed at 02/10/17 0556  Gross per 24 hour  Intake          2552.92 ml  Output               55 ml  Net          2497.92 ml   Filed Weights   02/08/17 0320 02/09/17 0434 02/10/17 0343  Weight: 83.9 kg (185 lb) 83.9 kg (185 lb) 84.4 kg (186 lb)    Telemetry    Afib rate 100-115 - Personally Reviewed  ECG    Afib no acute ECG chagnes  - Personally Reviewed  Physical Exam   GEN: No acute distress.   Neck: No JVD Cardiac: RRR, no murmurs, rubs, or gallops.  Respiratory: Clear to auscultation bilaterally. GI: tympanitic and distended not sore  MS: No edema; No deformity. Neuro:  Nonfocal  Psych: Normal  affect   Labs    Chemistry Recent Labs Lab 02/08/17 0516 02/08/17 0535 02/08/17 0805 02/09/17 0327 02/10/17 0047  NA 134* 136  --  135 134*  K 3.0* 3.1*  --  3.2* 3.6  CL 106 102  --  110 109  CO2 17*  --   --  21* 17*  GLUCOSE 104* 102*  --  113* 166*  BUN 15 16  --  13 14  CREATININE 1.01 0.80 0.99 0.81 0.78  CALCIUM 7.7*  --   --  7.6* 7.5*  PROT 5.6*  --   --  5.2* 5.6*  ALBUMIN 2.7*  --   --  2.4* 2.4*  AST 28  --   --  26 54*  ALT 14*  --   --  22 43  ALKPHOS 51  --   --  51 64  BILITOT 1.3*  --   --  0.8 0.3  GFRNONAA >60  --  >60 >60 >60  GFRAA >60  --  >60 >60 >60  ANIONGAP 11  --   --  4* 8     Hematology Recent Labs Lab 02/08/17 1656 02/09/17 0327 02/10/17 0047  WBC 14.7* 11.7* 12.0*  RBC 3.74*  3.36* 3.45*  HGB 12.2* 11.1* 11.4*  HCT 36.4* 32.7* 33.0*  MCV 97.3 97.3 95.7  MCH 32.6 33.0 33.0  MCHC 33.5 33.9 34.5  RDW 13.5 13.6 13.5  PLT 153 155 156    Cardiac Enzymes Recent Labs Lab 02/08/17 1656 02/09/17 1014 02/09/17 1812 02/10/17 0047  TROPONINI 1.07* 0.93* 0.69* 0.55*    Recent Labs Lab 02/08/17 0424  TROPIPOC 0.03     BNPNo results for input(s): BNP, PROBNP in the last 168 hours.   DDimer No results for input(s): DDIMER in the last 168 hours.   Radiology    Dg Cholangiogram Operative  Result Date: 02/09/2017 CLINICAL DATA:  83 year old male with a history of cholelithiasis EXAM: INTRAOPERATIVE CHOLANGIOGRAM TECHNIQUE: Cholangiographic images from the C-arm fluoroscopic device were submitted for interpretation post-operatively. Please see the procedural report for the amount of contrast and the fluoroscopy time utilized. COMPARISON:  None. FINDINGS: Surgical instruments project over the upper abdomen. There is cannulation of the cystic duct/gallbladder neck, with antegrade infusion of contrast. Caliber of the extrahepatic ductal system within normal limits. No large filling defect identified. Free flow of contrast across the  ampulla. IMPRESSION: Intraoperative cholangiogram demonstrates extrahepatic biliary ducts of unremarkable caliber, with no large filling defect identified. Free flow of contrast across the ampulla. Please refer to the dictated operative report for full details of intraoperative findings and procedure Electronically Signed   By: Corrie Mckusick D.O.   On: 02/09/2017 17:17   Dg Chest Port 1 View  Result Date: 02/09/2017 CLINICAL DATA:  Wheezing. EXAM: PORTABLE CHEST 1 VIEW COMPARISON:  02/08/2017. FINDINGS: Mediastinum and hilar structures normal. Cardiomegaly with normal pulmonary vascularity. Low lung volumes with mild basilar atelectasis. Mild infiltrate right lung base cannot be excluded. Stable calcified density left apex, most likely calcified granuloma. No pleural effusion or pneumothorax . IMPRESSION: 1. Cardiomegaly with normal pulmonary vascularity. 2. Low lung volumes with mild bibasilar atelectasis. Mild infiltrate right lung base cannot be excluded . Electronically Signed   By: Marcello Moores  Register   On: 02/09/2017 07:38    Cardiac Studies   Echo : EF 45-50%   Patient Profile     83 y.o. male  With gangrenous GB elevated toponin and afib   Assessment & Plan    1) Troponin : no chest pain decreased post op will arrange lexiscan myovue prior to d/c Or as outpatient ECG no acute ST changes  2. AFib.  On lovenox still has not passed gas will write for iv heparin and coumadin to start in  Am if ok with surgery Favor coumadin since it is more easily reversed if any post op issues Can transition to [place] as outpatient.   Signed, Jenkins Rouge, MD  02/10/2017, 8:42 AM

## 2017-02-11 LAB — BLOOD CULTURE ID PANEL (REFLEXED)
ACINETOBACTER BAUMANNII: NOT DETECTED
CANDIDA ALBICANS: NOT DETECTED
CANDIDA GLABRATA: NOT DETECTED
CANDIDA TROPICALIS: NOT DETECTED
Candida krusei: NOT DETECTED
Candida parapsilosis: NOT DETECTED
Carbapenem resistance: NOT DETECTED
ENTEROBACTER CLOACAE COMPLEX: NOT DETECTED
ENTEROCOCCUS SPECIES: NOT DETECTED
ESCHERICHIA COLI: DETECTED — AB
Enterobacteriaceae species: DETECTED — AB
HAEMOPHILUS INFLUENZAE: NOT DETECTED
Klebsiella oxytoca: NOT DETECTED
Klebsiella pneumoniae: NOT DETECTED
LISTERIA MONOCYTOGENES: NOT DETECTED
NEISSERIA MENINGITIDIS: NOT DETECTED
Proteus species: NOT DETECTED
Pseudomonas aeruginosa: NOT DETECTED
STAPHYLOCOCCUS SPECIES: NOT DETECTED
STREPTOCOCCUS AGALACTIAE: NOT DETECTED
STREPTOCOCCUS PNEUMONIAE: NOT DETECTED
Serratia marcescens: NOT DETECTED
Staphylococcus aureus (BCID): NOT DETECTED
Streptococcus pyogenes: NOT DETECTED
Streptococcus species: NOT DETECTED

## 2017-02-11 LAB — COMPREHENSIVE METABOLIC PANEL
ALK PHOS: 63 U/L (ref 38–126)
ALT: 35 U/L (ref 17–63)
ANION GAP: 7 (ref 5–15)
AST: 24 U/L (ref 15–41)
Albumin: 2.5 g/dL — ABNORMAL LOW (ref 3.5–5.0)
BILIRUBIN TOTAL: 0.5 mg/dL (ref 0.3–1.2)
BUN: 18 mg/dL (ref 6–20)
CALCIUM: 7.9 mg/dL — AB (ref 8.9–10.3)
CO2: 20 mmol/L — ABNORMAL LOW (ref 22–32)
CREATININE: 0.76 mg/dL (ref 0.61–1.24)
Chloride: 110 mmol/L (ref 101–111)
Glucose, Bld: 148 mg/dL — ABNORMAL HIGH (ref 65–99)
Potassium: 3.9 mmol/L (ref 3.5–5.1)
SODIUM: 137 mmol/L (ref 135–145)
TOTAL PROTEIN: 5.7 g/dL — AB (ref 6.5–8.1)

## 2017-02-11 LAB — PROTIME-INR
INR: 1.15
Prothrombin Time: 14.7 seconds (ref 11.4–15.2)

## 2017-02-11 LAB — CBC
HCT: 34.5 % — ABNORMAL LOW (ref 39.0–52.0)
HEMOGLOBIN: 11.7 g/dL — AB (ref 13.0–17.0)
MCH: 32.2 pg (ref 26.0–34.0)
MCHC: 33.9 g/dL (ref 30.0–36.0)
MCV: 95 fL (ref 78.0–100.0)
PLATELETS: 198 10*3/uL (ref 150–400)
RBC: 3.63 MIL/uL — AB (ref 4.22–5.81)
RDW: 13.2 % (ref 11.5–15.5)
WBC: 15.7 10*3/uL — AB (ref 4.0–10.5)

## 2017-02-11 LAB — HEPARIN LEVEL (UNFRACTIONATED)
HEPARIN UNFRACTIONATED: 0.12 [IU]/mL — AB (ref 0.30–0.70)
HEPARIN UNFRACTIONATED: 0.24 [IU]/mL — AB (ref 0.30–0.70)
HEPARIN UNFRACTIONATED: 0.36 [IU]/mL (ref 0.30–0.70)

## 2017-02-11 MED ORDER — DILTIAZEM HCL ER COATED BEADS 240 MG PO CP24
240.0000 mg | ORAL_CAPSULE | Freq: Every day | ORAL | Status: DC
  Administered 2017-02-12 – 2017-02-13 (×2): 240 mg via ORAL
  Filled 2017-02-11 (×3): qty 1

## 2017-02-11 MED ORDER — DEXTROSE 5 % IV SOLN
2.0000 g | INTRAVENOUS | Status: DC
  Administered 2017-02-11: 2 g via INTRAVENOUS
  Filled 2017-02-11 (×2): qty 2

## 2017-02-11 MED ORDER — DIPHENHYDRAMINE HCL 25 MG PO CAPS
50.0000 mg | ORAL_CAPSULE | Freq: Once | ORAL | Status: AC
  Administered 2017-02-12: 50 mg via ORAL
  Filled 2017-02-11: qty 2

## 2017-02-11 MED ORDER — POTASSIUM CHLORIDE CRYS ER 20 MEQ PO TBCR
40.0000 meq | EXTENDED_RELEASE_TABLET | Freq: Once | ORAL | Status: AC
  Administered 2017-02-11: 40 meq via ORAL
  Filled 2017-02-11: qty 2

## 2017-02-11 MED ORDER — DILTIAZEM HCL 60 MG PO TABS
60.0000 mg | ORAL_TABLET | Freq: Four times a day (QID) | ORAL | Status: AC
  Administered 2017-02-11 (×2): 60 mg via ORAL
  Filled 2017-02-11 (×2): qty 1

## 2017-02-11 MED ORDER — WARFARIN SODIUM 5 MG PO TABS
5.0000 mg | ORAL_TABLET | Freq: Once | ORAL | Status: AC
  Administered 2017-02-11: 5 mg via ORAL
  Filled 2017-02-11: qty 1

## 2017-02-11 NOTE — Progress Notes (Signed)
PHARMACY - PHYSICIAN COMMUNICATION CRITICAL VALUE ALERT - BLOOD CULTURE IDENTIFICATION (BCID)  Results for orders placed or performed during the hospital encounter of 02/08/17  Blood Culture ID Panel (Reflexed) (Collected: 02/08/2017  4:10 AM)  Result Value Ref Range   Enterococcus species NOT DETECTED NOT DETECTED   Listeria monocytogenes NOT DETECTED NOT DETECTED   Staphylococcus species NOT DETECTED NOT DETECTED   Staphylococcus aureus NOT DETECTED NOT DETECTED   Streptococcus species NOT DETECTED NOT DETECTED   Streptococcus agalactiae NOT DETECTED NOT DETECTED   Streptococcus pneumoniae NOT DETECTED NOT DETECTED   Streptococcus pyogenes NOT DETECTED NOT DETECTED   Acinetobacter baumannii NOT DETECTED NOT DETECTED   Enterobacteriaceae species DETECTED (A) NOT DETECTED   Enterobacter cloacae complex NOT DETECTED NOT DETECTED   Escherichia coli DETECTED (A) NOT DETECTED   Klebsiella oxytoca NOT DETECTED NOT DETECTED   Klebsiella pneumoniae NOT DETECTED NOT DETECTED   Proteus species NOT DETECTED NOT DETECTED   Serratia marcescens NOT DETECTED NOT DETECTED   Carbapenem resistance NOT DETECTED NOT DETECTED   Haemophilus influenzae NOT DETECTED NOT DETECTED   Neisseria meningitidis NOT DETECTED NOT DETECTED   Pseudomonas aeruginosa NOT DETECTED NOT DETECTED   Candida albicans NOT DETECTED NOT DETECTED   Candida glabrata NOT DETECTED NOT DETECTED   Candida krusei NOT DETECTED NOT DETECTED   Candida parapsilosis NOT DETECTED NOT DETECTED   Candida tropicalis NOT DETECTED NOT DETECTED    Name of physician (or Provider) Contacted: Text page to Dr. Thereasa Solo  Changes to prescribed antibiotics required: Patient is on zosyn which will cover. Could de-escalate to ceftriaxone 2g IV q24 if clinically appropriate.   Deboraha Sprang 02/11/2017  1:47 PM

## 2017-02-11 NOTE — Progress Notes (Signed)
ANTICOAGULATION CONSULT NOTE Pharmacy Consult for Heparin  Indication: atrial fibrillation  No Known Allergies  Patient Measurements: Height: 5\' 10"  (177.8 cm) Weight: 214 lb 9.6 oz (97.3 kg) IBW/kg (Calculated) : 73 Heparin Dosing Weight: 84kg  Vital Signs: Temp: 98.2 F (36.8 C) (06/24 2338) Temp Source: Oral (06/24 1559) BP: 146/86 (06/24 2338) Pulse Rate: 103 (06/24 2338)  Labs:  Recent Labs  02/09/17 0327 02/09/17 1014 02/09/17 1812 02/10/17 0047 02/10/17 0955  02/11/17 0308 02/11/17 1319 02/11/17 2248  HGB 11.1*  --   --  11.4*  --   --  11.7*  --   --   HCT 32.7*  --   --  33.0*  --   --  34.5*  --   --   PLT 155  --   --  156  --   --  198  --   --   LABPROT  --   --   --   --  14.1  --  14.7  --   --   INR  --   --   --   --  1.08  --  1.15  --   --   HEPARINUNFRC  --   --   --   --   --   < > 0.12* 0.24* 0.36  CREATININE 0.81  --   --  0.78  --   --  0.76  --   --   TROPONINI  --  0.93* 0.69* 0.55*  --   --   --   --   --   < > = values in this interval not displayed.  Estimated Creatinine Clearance: 83.3 mL/min (by C-G formula based on SCr of 0.76 mg/dL).  Assessment: 83 y.o. male with Afib for heparin  Goal of Therapy:  INR 2-3 Heparin level 0.3-0.7 units/ml Monitor platelets by anticoagulation protocol: Yes   Plan:  Continue Heparin at current rate  Follow-up am labs.  Phillis Knack, PharmD, BCPS  02/11/2017 11:40 PM

## 2017-02-11 NOTE — Progress Notes (Signed)
ANTICOAGULATION CONSULT NOTE - Initial Consult  Pharmacy Consult for Warfarin with Heparin Bridge Indication: atrial fibrillation  No Known Allergies  Patient Measurements: Height: 5\' 10"  (177.8 cm) Weight: 214 lb 9.6 oz (97.3 kg) IBW/kg (Calculated) : 73 Heparin Dosing Weight: 84kg  Vital Signs: Temp: 98.2 F (36.8 C) (06/24 1100) Temp Source: Oral (06/24 1100) BP: 138/82 (06/24 1100) Pulse Rate: 81 (06/24 1100)  Labs:  Recent Labs  02/09/17 0327 02/09/17 1014 02/09/17 1812 02/10/17 0047 02/10/17 0955 02/10/17 1815 02/11/17 0308  HGB 11.1*  --   --  11.4*  --   --  11.7*  HCT 32.7*  --   --  33.0*  --   --  34.5*  PLT 155  --   --  156  --   --  198  LABPROT  --   --   --   --  14.1  --  14.7  INR  --   --   --   --  1.08  --  1.15  HEPARINUNFRC  --   --   --   --   --  <0.10* 0.12*  CREATININE 0.81  --   --  0.78  --   --  0.76  TROPONINI  --  0.93* 0.69* 0.55*  --   --   --     Estimated Creatinine Clearance: 83.3 mL/min (by C-G formula based on SCr of 0.76 mg/dL).   Medical History: Past Medical History:  Diagnosis Date  . Arthritis    right knee oa  . Asthma   . Carpal tunnel syndrome    BILATERAL  . Hypertension   . SBO (small bowel obstruction) ([place]) 12/2015    Medications:  Prescriptions Prior to Admission  Medication Sig Dispense Refill Last Dose  . acetaminophen (TYLENOL) 500 MG tablet Take 1,000 mg by mouth every 6 (six) hours as needed for mild pain.   unk  . cetirizine (ZYRTEC) 10 MG tablet Take 10 mg by mouth daily.   02/07/2017 at Unknown time  . chlorproMAZINE (THORAZINE) 10 MG tablet Take 1-2.5 tablets (10-25 mg total) by mouth 3 (three) times daily as needed for hiccoughs. 20 tablet 0 unk  . fluticasone (FLOVENT HFA) 110 MCG/ACT inhaler Inhale 1 puff into the lungs 2 (two) times daily.    02/07/2017 at Unknown time  . ondansetron (ZOFRAN) 4 MG tablet Take 1 tablet (4 mg total) by mouth every 6 (six) hours as needed for nausea. 20 tablet 0  unk  . valsartan-hydrochlorothiazide (DIOVAN-HCT) 160-12.5 MG per tablet Take 1 tablet by mouth every morning.   02/07/2017 at Unknown time   Scheduled:  . aspirin EC  81 mg Oral Daily  . budesonide (PULMICORT) nebulizer solution  0.25 mg Nebulization BID  . diltiazem  60 mg Oral Q6H  . sodium chloride flush  3 mL Intravenous Q12H  . warfarin   Does not apply Once  . Warfarin - Pharmacist Dosing Inpatient   Does not apply q1800   Infusions:  . sodium chloride 75 mL/hr at 02/11/17 0437  . cefTRIAXone (ROCEPHIN)  IV    . heparin 1,650 Units/hr (02/11/17 0437)  . metronidazole Stopped (02/11/17 0944)   PRN: acetaminophen **OR** acetaminophen, HYDROcodone-acetaminophen, hydroxypropyl methylcellulose / hypromellose, ketorolac, levalbuterol, morphine injection, ondansetron **OR** ondansetron (ZOFRAN) IV  Assessment: Patient is a 83 year old male admitted w/ constipation and found to be in A Fib w/ RVR (new) and cholecystitis. CHADS-VASc = 3 (age, HTN). No anticoagulation PTA. Patient  is s/p laparoscopic cholecystectomy on 6/22, and was cleared for anticoagulation by surgery. Patient will begin warfarin with heparin bridge 6/23 while inpatient. Per cards note, wanted reversible agents while inpatient, may do DOAC on discharge.   Heparin level is subtherapeutic again at 0.24 on 1650 units/hr. Hemoglobin and platelet count remain stable. No bleeding noted.   INR is subtherapeutic at 1.15 which is expected after first dose. Given recent surgery, will repeat same dose tonight.   Goal of Therapy:  INR 2-3 Heparin level 0.3-0.7 units/ml Monitor platelets by anticoagulation protocol: Yes   Plan:  Repeat warfarin 5mg  PO x1 tonight Increase heparin to 1850 units/hr IV 8-hour heparin level  Daily CBC, heparin level, and INR Monitor signs/symptoms of bleeding   Demetrius Charity, PharmD Acute Care Pharmacy Resident  Pager: [phone] 02/11/2017

## 2017-02-11 NOTE — Progress Notes (Signed)
2 Days Post-Op   Subjective/Chief Complaint: No c/o other than doesn't like the hospital food No n/v. +BM, flatus. Wife at [facility]   Objective: Vital signs in last 24 hours: Temp:  [97.8 F (36.6 C)-98.2 F (36.8 C)] 97.8 F (36.6 C) (06/24 0410) Pulse Rate:  [79-90] 89 (06/24 0410) Resp:  [10-29] 29 (06/24 0736) BP: (112-137)/(50-90) 125/81 (06/24 0736) SpO2:  [91 %-95 %] 91 % (06/24 0410) Weight:  [97.3 kg (214 lb 9.6 oz)] 97.3 kg (214 lb 9.6 oz) (06/24 0410) Last BM Date: 02/11/17  Intake/Output from previous day: 06/23 0701 - 06/24 0700 In: 4039.2 [P.O.:780; I.V.:3059.2; IV Piggyback:200] Out: 31 [Drains:30; Stool:1] Intake/Output this shift: No intake/output data recorded.  Alert, sitting in bs chair Symmetric chest rise Rate controlled Soft, min TTP, incisions ok, drain - serosang  Lab Results:   Recent Labs  02/10/17 0047 02/11/17 0308  WBC 12.0* 15.7*  HGB 11.4* 11.7*  HCT 33.0* 34.5*  PLT 156 198   BMET  Recent Labs  02/10/17 0047 02/11/17 0308  NA 134* 137  K 3.6 3.9  CL 109 110  CO2 17* 20*  GLUCOSE 166* 148*  BUN 14 18  CREATININE 0.78 0.76  CALCIUM 7.5* 7.9*   PT/INR  Recent Labs  02/10/17 0955 02/11/17 0308  LABPROT 14.1 14.7  INR 1.08 1.15   ABG No results for input(s): PHART, HCO3 in the last 72 hours.  Invalid input(s): PCO2, PO2  Studies/Results: Dg Cholangiogram Operative  Result Date: 02/09/2017 CLINICAL DATA:  83 year old male with a history of cholelithiasis EXAM: INTRAOPERATIVE CHOLANGIOGRAM TECHNIQUE: Cholangiographic images from the C-arm fluoroscopic device were submitted for interpretation post-operatively. Please see the procedural report for the amount of contrast and the fluoroscopy time utilized. COMPARISON:  None. FINDINGS: Surgical instruments project over the upper abdomen. There is cannulation of the cystic duct/gallbladder neck, with antegrade infusion of contrast. Caliber of the extrahepatic ductal system  within normal limits. No large filling defect identified. Free flow of contrast across the ampulla. IMPRESSION: Intraoperative cholangiogram demonstrates extrahepatic biliary ducts of unremarkable caliber, with no large filling defect identified. Free flow of contrast across the ampulla. Please refer to the dictated operative report for full details of intraoperative findings and procedure Electronically Signed   By: Corrie Mckusick D.O.   On: 02/09/2017 17:17    Anti-infectives: Anti-infectives    Start     Dose/Rate Route Frequency Ordered Stop   02/08/17 1800  vancomycin (VANCOCIN) IVPB 750 mg/150 ml premix  Status:  Discontinued     750 mg 150 mL/hr over 60 Minutes Intravenous Every 12 hours 02/08/17 1145 02/10/17 1615   02/08/17 1300  piperacillin-tazobactam (ZOSYN) IVPB 3.375 g     3.375 g 12.5 mL/hr over 240 Minutes Intravenous Every 8 hours 02/08/17 1145     02/08/17 0900  metroNIDAZOLE (FLAGYL) IVPB 500 mg     500 mg 100 mL/hr over 60 Minutes Intravenous Every 8 hours 02/08/17 0737     02/08/17 0445  vancomycin (VANCOCIN) IVPB 1000 mg/200 mL premix     1,000 mg 200 mL/hr over 60 Minutes Intravenous  Once 02/08/17 0430 02/08/17 0633   02/08/17 0445  piperacillin-tazobactam (ZOSYN) IVPB 3.375 g     3.375 g 100 mL/hr over 30 Minutes Intravenous  Once 02/08/17 0430 02/08/17 0525      Assessment/Plan: Severe acute suppurative cholecystitis s/p Procedure(s): LAPAROSCOPIC CHOLECYSTECTOMY WITH INTRAOPERATIVE CHOLANGIOGRAM (N/A) 6/22 Dr Georgette Dover  New onset Afib with RVR Elevated Troponin - trending down  Doing  well from [place] surgery standpoint.  Cont drain - no sign of bile leak Would cont IV abx for now given degree of infection of GB Cont anticoagulation - no sign of bleeding  Ambulate, pulm toilet  Leighton Ruff. Redmond Pulling, MD, FACS General, Bariatric, & Minimally Invasive Surgery [facility], [place]    LOS: 3 days    Gayland Curry 02/11/2017

## 2017-02-11 NOTE — Progress Notes (Signed)
Progress Note  Patient Name: Daniel Yu Date of Encounter: 02/11/2017  Primary Cardiologist: Stanford Breed  Subjective   Taking PO had BM ambulating   Inpatient Medications    Scheduled Meds: . aspirin EC  81 mg Oral Daily  . budesonide (PULMICORT) nebulizer solution  0.25 mg Nebulization BID  . sodium chloride flush  3 mL Intravenous Q12H  . warfarin   Does not apply Once  . Warfarin - Pharmacist Dosing Inpatient   Does not apply q1800   Continuous Infusions: . sodium chloride 75 mL/hr at 02/11/17 0437  . diltiazem (CARDIZEM) infusion 15 mg/hr (02/11/17 0723)  . heparin 1,650 Units/hr (02/11/17 0437)  . metronidazole 500 mg (02/11/17 0844)  . piperacillin-tazobactam (ZOSYN)  IV Stopped (02/11/17 0837)   PRN Meds: acetaminophen **OR** acetaminophen, HYDROcodone-acetaminophen, hydroxypropyl methylcellulose / hypromellose, ketorolac, levalbuterol, morphine injection, ondansetron **OR** ondansetron (ZOFRAN) IV   Vital Signs    Vitals:   02/11/17 0041 02/11/17 0410 02/11/17 0736 02/11/17 0857  BP: 125/87 128/90 125/81   Pulse: 79 89    Resp: 10 (!) 22 (!) 29   Temp: 98.2 F (36.8 C) 97.8 F (36.6 C)    TempSrc: Oral     SpO2: 91% 91%  96%  Weight:  97.3 kg (214 lb 9.6 oz)    Height:        Intake/Output Summary (Last 24 hours) at 02/11/17 0904 Last data filed at 02/11/17 0852  Gross per 24 hour  Intake          4099.18 ml  Output               31 ml  Net          4068.18 ml   Filed Weights   02/09/17 0434 02/10/17 0343 02/11/17 0410  Weight: 83.9 kg (185 lb) 84.4 kg (186 lb) 97.3 kg (214 lb 9.6 oz)    Telemetry    Afib rate 80-90- Personally Reviewed  ECG    Afib no acute ECG chagnes  - Personally Reviewed  Physical Exam   GEN: No acute distress.   Neck: No JVD Cardiac: RRR, no murmurs, rubs, or gallops.  Respiratory: Clear to auscultation bilaterally. GI: tympanitic and distended not sore  MS: No edema; No deformity. Neuro:  Nonfocal  Psych:  Normal affect   Labs    Chemistry  Recent Labs Lab 02/09/17 0327 02/10/17 0047 02/11/17 0308  NA 135 134* 137  K 3.2* 3.6 3.9  CL 110 109 110  CO2 21* 17* 20*  GLUCOSE 113* 166* 148*  BUN 13 14 18   CREATININE 0.81 0.78 0.76  CALCIUM 7.6* 7.5* 7.9*  PROT 5.2* 5.6* 5.7*  ALBUMIN 2.4* 2.4* 2.5*  AST 26 54* 24  ALT 22 43 35  ALKPHOS 51 64 63  BILITOT 0.8 0.3 0.5  GFRNONAA >60 >60 >60  GFRAA >60 >60 >60  ANIONGAP 4* 8 7     Hematology  Recent Labs Lab 02/09/17 0327 02/10/17 0047 02/11/17 0308  WBC 11.7* 12.0* 15.7*  RBC 3.36* 3.45* 3.63*  HGB 11.1* 11.4* 11.7*  HCT 32.7* 33.0* 34.5*  MCV 97.3 95.7 95.0  MCH 33.0 33.0 32.2  MCHC 33.9 34.5 33.9  RDW 13.6 13.5 13.2  PLT 155 156 198    Cardiac Enzymes  Recent Labs Lab 02/08/17 1656 02/09/17 1014 02/09/17 1812 02/10/17 0047  TROPONINI 1.07* 0.93* 0.69* 0.55*     Recent Labs Lab 02/08/17 0424  TROPIPOC 0.03     BNPNo results  for input(s): BNP, PROBNP in the last 168 hours.   DDimer No results for input(s): DDIMER in the last 168 hours.   Radiology    Dg Cholangiogram Operative  Result Date: 02/09/2017 CLINICAL DATA:  83 year old male with a history of cholelithiasis EXAM: INTRAOPERATIVE CHOLANGIOGRAM TECHNIQUE: Cholangiographic images from the C-arm fluoroscopic device were submitted for interpretation post-operatively. Please see the procedural report for the amount of contrast and the fluoroscopy time utilized. COMPARISON:  None. FINDINGS: Surgical instruments project over the upper abdomen. There is cannulation of the cystic duct/gallbladder neck, with antegrade infusion of contrast. Caliber of the extrahepatic ductal system within normal limits. No large filling defect identified. Free flow of contrast across the ampulla. IMPRESSION: Intraoperative cholangiogram demonstrates extrahepatic biliary ducts of unremarkable caliber, with no large filling defect identified. Free flow of contrast across the  ampulla. Please refer to the dictated operative report for full details of intraoperative findings and procedure Electronically Signed   By: Corrie Mckusick D.O.   On: 02/09/2017 17:17    Cardiac Studies   Echo : EF 45-50%   Patient Profile     83 y.o. male  With gangrenous GB elevated toponin and afib   Assessment & Plan    1) Troponin : no chest pain decreased post op Needs lexiscan myovue to risk stratify. At this point can likely do as outpatient ECG no acute ST changes  2). AFib.  Rate control ok on heparin first dose of coumadin 02/10/17  Change iv cardizem to PO  3) GB: gangrenous tough surgery wounds healing well still with drain In continue zosyn  Outpatient f/u with Christiana Fuchs, [place], MD  02/11/2017, 9:04 AM

## 2017-02-11 NOTE — Progress Notes (Addendum)
Pharmacy Antibiotic Note Daniel Yu is a 83 y.o. male admitted on 02/08/2017 with possible intra-abdominal infection.  BCID today showed E Coli in 1/2 blood cultures. He is s/p laparoscopic cholecystectomy 6/22.   Plan: Zosyn changed to ceftriaxone 2 gm IV every 24 hours Flagyl 500 mg IV q8hr per MD  F/u length of therapy and clinical progress  Height: 5\' 10"  (177.8 cm) Weight: 214 lb 9.6 oz (97.3 kg) IBW/kg (Calculated) : 73  Temp (24hrs), Avg:98.1 F (36.7 C), Min:97.8 F (36.6 C), Max:98.2 F (36.8 C)   Recent Labs Lab 02/08/17 0426  02/08/17 0535 02/08/17 0647 02/08/17 0805 02/08/17 1152 02/08/17 1656 02/09/17 0327 02/10/17 0047 02/11/17 0308  WBC  --   --   --   --  14.7*  --  14.7* 11.7* 12.0* 15.7*  CREATININE  --   < > 0.80  --  0.99  --   --  0.81 0.78 0.76  LATICACIDVEN 3.94*  --   --  3.25* 2.6* 2.1* 2.7*  --   --   --   < > = values in this interval not displayed.  Estimated Creatinine Clearance: 83.3 mL/min (by C-G formula based on SCr of 0.76 mg/dL).    No Known Allergies  Antimicrobials this admission: 6/21 Vanc >> 6/23 6/21 Zosyn >> 6/24 6/21 Flagyl >>  6/24 Ceftriaxone >>  Dose adjustments this admission: n/a  Microbiology results: 6/21 MRSA PCR: neg 6/21 UCx: NG 6/21 BCx: E Coli 1/2  Demetrius Charity, PharmD Acute Care Pharmacy Resident  Pager: [phone] 02/11/2017

## 2017-02-11 NOTE — Progress Notes (Signed)
Daniel Yu  MVE:[id number] DOB: 24-Jul-1932 DOA: 02/08/2017 PCP: Lavone Orn, MD    Brief Narrative:  83 y.o.M Hx HTN, Asthma, and SBO who presented to the ED w/ nausea andabdom pain. In the ED CT revealed inflamed bowel and gallbladder. Right upper quadrant ultrasound showed acute cholecystitis.  Subjective: Doing very well from a medical standpoint.  Denies chest pain shortness breath fevers chills nausea or vomiting.  We had an extensive discussion concerning anticoagulation options and the pros and cons of each.  Assessment & Plan:  Acute severe suppurative cholecystitis Status post laparoscopic cholecystectomy 6/22 - postop care per General Surgery  Newly diagnosed atrial fibrillation with RVR Cardiology following - heparin being transitioned to Coumadin with later consideration to [place] as outpt - can consider earlier transition to [place] if continues to do so well, v/s change to lovenox > warfarin to allow for d/c home sooner  Elevated troponin Cardiology following - to have Lexi scan Myoview prior to discharge or possibly as outpt - trop peaked at 1.07 - asymtomatic   Hypokalemia approaching goal of 4.0 - magnesium is normal  Asthma Quiescent  HTN Blood pressure presently controlled  DVT prophylaxis: lovenox  Code Status: FULL CODE Family Communication: Spoke with wife at bedside  Disposition Plan: medically stable - ultimate timing of d/c to be determined by post-op care at this point   Consultants:  General surgery [facility] cardiology  Procedures: 6/22 TTE - EF 45-50% - diffuse hypokinesis   Antimicrobials:  Flagyl 6/21 > Rocephin 6/24 > Zosyn 6/20 > 6/24 Vancomycin 6/20 > 6/22  Objective: Blood pressure 138/82, pulse 81, temperature 98.2 F (36.8 C), temperature source Oral, resp. rate 20, height 5\' 10"  (1.778 m), weight 97.3 kg (214 lb 9.6 oz), SpO2 93 %.  Intake/Output Summary (Last 24 hours) at 02/11/17  1324 Last data filed at 02/11/17 0852  Gross per 24 hour  Intake          3859.18 ml  Output               31 ml  Net          3828.18 ml   Filed Weights   02/09/17 0434 02/10/17 0343 02/11/17 0410  Weight: 83.9 kg (185 lb) 84.4 kg (186 lb) 97.3 kg (214 lb 9.6 oz)    Examination: General: No acute respiratory distress Lungs: Clear to auscultation B w/o wheezing  Cardiovascular: Irregularly irregular without M  Extremities: No significant edema bilateral lower extremities  CBC:  Recent Labs Lab 02/08/17 0344  02/08/17 0805 02/08/17 1656 02/09/17 0327 02/10/17 0047 02/11/17 0308  WBC 9.6  --  14.7* 14.7* 11.7* 12.0* 15.7*  NEUTROABS 9.2*  --   --   --   --  11.1*  --   HGB 12.7*  < > 11.9* 12.2* 11.1* 11.4* 11.7*  HCT 36.9*  < > 35.1* 36.4* 32.7* 33.0* 34.5*  MCV 94.9  --  95.9 97.3 97.3 95.7 95.0  PLT 200  --  164 153 155 156 198  < > = values in this interval not displayed. Basic Metabolic Panel:  Recent Labs Lab 02/08/17 0516 02/08/17 0535 02/08/17 0805 02/08/17 1656 02/09/17 0327 02/10/17 0047 02/11/17 0308  NA 134* 136  --   --  135 134* 137  K 3.0* 3.1*  --   --  3.2* 3.6 3.9  CL 106 102  --   --  110 109 110  CO2  17*  --   --   --  21* 17* 20*  GLUCOSE 104* 102*  --   --  113* 166* 148*  BUN 15 16  --   --  13 14 18   CREATININE 1.01 0.80 0.99  --  0.81 0.78 0.76  CALCIUM 7.7*  --   --   --  7.6* 7.5* 7.9*  MG  --   --   --  1.9  --  2.0  --   PHOS  --   --   --  2.6  --   --   --    GFR: Estimated Creatinine Clearance: 83.3 mL/min (by C-G formula based on SCr of 0.76 mg/dL).  Liver Function Tests:  Recent Labs Lab 02/08/17 0516 02/09/17 0327 02/10/17 0047 02/11/17 0308  AST 28 26 54* 24  ALT 14* 22 43 35  ALKPHOS 51 51 64 63  BILITOT 1.3* 0.8 0.3 0.5  PROT 5.6* 5.2* 5.6* 5.7*  ALBUMIN 2.7* 2.4* 2.4* 2.5*    Recent Labs Lab 02/08/17 0515  LIPASE 18   Coagulation Profile:  Recent Labs Lab 02/10/17 0955 02/11/17 0308  INR 1.08  1.15    Cardiac Enzymes:  Recent Labs Lab 02/08/17 1656 02/09/17 1014 02/09/17 1812 02/10/17 0047  TROPONINI 1.07* 0.93* 0.69* 0.55*    HbA1C: Hgb A1c MFr Bld  Date/Time Value Ref Range Status  01/03/2016 06:08 PM 5.4 4.8 - 5.6 % Final    Comment:    (NOTE)         Pre-diabetes: 5.7 - 6.4         Diabetes: >6.4         Glycemic control for adults with diabetes: <7.0     Recent Results (from the past 240 hour(s))  Blood Culture (routine x 2)     Status: None (Preliminary result)   Collection Time: 02/08/17  4:10 AM  Result Value Ref Range Status   Specimen Description BLOOD RIGHT HAND  Final   Special Requests   Final    BOTTLES DRAWN AEROBIC AND ANAEROBIC Blood Culture adequate volume   Culture  Setup Time   Final    GRAM NEGATIVE RODS ANAEROBIC BOTTLE ONLY CRITICAL RESULT CALLED TO, READ BACK BY AND VERIFIED WITH: A JONES,PHARMD AT 1301 02/11/17 BY L BENFIELD    Culture GRAM NEGATIVE RODS  Final   Report Status PENDING  Incomplete  Blood Culture ID Panel (Reflexed)     Status: Abnormal   Collection Time: 02/08/17  4:10 AM  Result Value Ref Range Status   Enterococcus species NOT DETECTED NOT DETECTED Final   Listeria monocytogenes NOT DETECTED NOT DETECTED Final   Staphylococcus species NOT DETECTED NOT DETECTED Final   Staphylococcus aureus NOT DETECTED NOT DETECTED Final   Streptococcus species NOT DETECTED NOT DETECTED Final   Streptococcus agalactiae NOT DETECTED NOT DETECTED Final   Streptococcus pneumoniae NOT DETECTED NOT DETECTED Final   Streptococcus pyogenes NOT DETECTED NOT DETECTED Final   Acinetobacter baumannii NOT DETECTED NOT DETECTED Final   Enterobacteriaceae species DETECTED (A) NOT DETECTED Final    Comment: Enterobacteriaceae represent a large family of gram-negative bacteria, not a single organism. CRITICAL RESULT CALLED TO, READ BACK BY AND VERIFIED WITH: A JONES,PHARMD AT 8938 02/11/17 BY L BENFIELD    Enterobacter cloacae complex NOT  DETECTED NOT DETECTED Final   Escherichia coli DETECTED (A) NOT DETECTED Final    Comment: CRITICAL RESULT CALLED TO, READ BACK BY AND VERIFIED WITH: A JONES,PHARMD  AT 1301 02/11/17 BY L BENFIELD    Klebsiella oxytoca NOT DETECTED NOT DETECTED Final   Klebsiella pneumoniae NOT DETECTED NOT DETECTED Final   Proteus species NOT DETECTED NOT DETECTED Final   Serratia marcescens NOT DETECTED NOT DETECTED Final   Carbapenem resistance NOT DETECTED NOT DETECTED Final   Haemophilus influenzae NOT DETECTED NOT DETECTED Final   Neisseria meningitidis NOT DETECTED NOT DETECTED Final   Pseudomonas aeruginosa NOT DETECTED NOT DETECTED Final   Candida albicans NOT DETECTED NOT DETECTED Final   Candida glabrata NOT DETECTED NOT DETECTED Final   Candida krusei NOT DETECTED NOT DETECTED Final   Candida parapsilosis NOT DETECTED NOT DETECTED Final   Candida tropicalis NOT DETECTED NOT DETECTED Final  Blood Culture (routine x 2)     Status: None (Preliminary result)   Collection Time: 02/08/17  4:21 AM  Result Value Ref Range Status   Specimen Description BLOOD LEFT HAND  Final   Special Requests   Final    BOTTLES DRAWN AEROBIC AND ANAEROBIC Blood Culture adequate volume   Culture NO GROWTH 3 DAYS  Final   Report Status PENDING  Incomplete  Urine culture     Status: None   Collection Time: 02/08/17  5:23 AM  Result Value Ref Range Status   Specimen Description URINE, CATHETERIZED  Final   Special Requests NONE  Final   Culture NO GROWTH  Final   Report Status 02/09/2017 FINAL  Final  Surgical pcr screen     Status: None   Collection Time: 02/08/17  2:09 PM  Result Value Ref Range Status   MRSA, PCR NEGATIVE NEGATIVE Final   Staphylococcus aureus NEGATIVE NEGATIVE Final    Comment:        The Xpert SA Assay (FDA approved for NASAL specimens in patients over 52 years of age), is one component of a comprehensive surveillance program.  Test performance has been validated by [facility] for  patients greater than or equal to 21 year old. It is not intended to diagnose infection nor to guide or monitor treatment.      Scheduled Meds: . aspirin EC  81 mg Oral Daily  . budesonide (PULMICORT) nebulizer solution  0.25 mg Nebulization BID  . diltiazem  60 mg Oral Q6H  . sodium chloride flush  3 mL Intravenous Q12H  . warfarin   Does not apply Once  . Warfarin - Pharmacist Dosing Inpatient   Does not apply q1800     LOS: 3 days   Cherene Altes, MD [facility] Office  [phone] Pager - Text Page per Amion as per below:  On-Call/Text Page:      [email or web address]      password [email or web address]  If 7PM-7AM, please contact night-coverage [email or web address] Password [email or web address] 02/11/2017, 1:24 PM

## 2017-02-11 NOTE — Progress Notes (Signed)
ANTICOAGULATION CONSULT NOTE - Follow Up Consult  Pharmacy Consult for Heparin  Indication: atrial fibrillation  No Known Allergies  Patient Measurements: Height: 5\' 10"  (177.8 cm) Weight: 186 lb (84.4 kg) IBW/kg (Calculated) : 73  Vital Signs: Temp: 97.8 F (36.6 C) (06/24 0410) Temp Source: Oral (06/24 0041) BP: 128/90 (06/24 0410) Pulse Rate: 89 (06/24 0410)  Labs:  Recent Labs  02/08/17 0805  02/09/17 0327 02/09/17 1014 02/09/17 1812 02/10/17 0047 02/10/17 0955 02/10/17 1815 02/11/17 0308  HGB 11.9*  < > 11.1*  --   --  11.4*  --   --  11.7*  HCT 35.1*  < > 32.7*  --   --  33.0*  --   --  34.5*  PLT 164  < > 155  --   --  156  --   --  198  LABPROT  --   --   --   --   --   --  14.1  --  14.7  INR  --   --   --   --   --   --  1.08  --  1.15  HEPARINUNFRC  --   --   --   --   --   --   --  <0.10* 0.12*  CREATININE 0.99  --  0.81  --   --  0.78  --   --   --   TROPONINI  --   < >  --  0.93* 0.69* 0.55*  --   --   --   < > = values in this interval not displayed.  Estimated Creatinine Clearance: 73.5 mL/min (by C-G formula based on SCr of 0.78 mg/dL).  Assessment: 83 y/o M POD #2 cholecystectomy on heparin bridge to warfarin for afib, heparin level remains sub-therapeutic, no issues per RN.   Goal of Therapy:  Heparin level 0.3-0.7 units/ml Monitor platelets by anticoagulation protocol: Yes   Plan:  Inc heparin to 1650 units/hr 1300 HL  Narda Bonds 02/11/2017,4:16 AM

## 2017-02-12 ENCOUNTER — Encounter (HOSPITAL_COMMUNITY): Payer: Self-pay | Admitting: General Practice

## 2017-02-12 DIAGNOSIS — I4891 Unspecified atrial fibrillation: Secondary | ICD-10-CM

## 2017-02-12 LAB — BASIC METABOLIC PANEL
Anion gap: 6 (ref 5–15)
BUN: 15 mg/dL (ref 6–20)
CALCIUM: 7.5 mg/dL — AB (ref 8.9–10.3)
CO2: 20 mmol/L — ABNORMAL LOW (ref 22–32)
CREATININE: 0.7 mg/dL (ref 0.61–1.24)
Chloride: 111 mmol/L (ref 101–111)
GFR calc Af Amer: >60 mL/min (ref >60)
GLUCOSE: 112 mg/dL — AB (ref 65–99)
Potassium: 3.5 mmol/L (ref 3.5–5.1)
SODIUM: 137 mmol/L (ref 135–145)

## 2017-02-12 LAB — CBC
HCT: 34.8 % — ABNORMAL LOW (ref 39.0–52.0)
Hemoglobin: 11.7 g/dL — ABNORMAL LOW (ref 13.0–17.0)
MCH: 31.8 pg (ref 26.0–34.0)
MCHC: 33.6 g/dL (ref 30.0–36.0)
MCV: 94.6 fL (ref 78.0–100.0)
PLATELETS: 218 10*3/uL (ref 150–400)
RBC: 3.68 MIL/uL — ABNORMAL LOW (ref 4.22–5.81)
RDW: 13.3 % (ref 11.5–15.5)
WBC: 9.5 10*3/uL (ref 4.0–10.5)

## 2017-02-12 LAB — PROTIME-INR
INR: 1.38
PROTHROMBIN TIME: 17 s — AB (ref 11.4–15.2)

## 2017-02-12 LAB — HEPARIN LEVEL (UNFRACTIONATED): Heparin Unfractionated: 0.32 IU/mL (ref 0.30–0.70)

## 2017-02-12 MED ORDER — METRONIDAZOLE 500 MG PO TABS: 500.0000 mg | ORAL_TABLET | Freq: Three times a day (TID) | ORAL | Status: DC

## 2017-02-12 MED ORDER — WARFARIN SODIUM 5 MG PO TABS
5.0000 mg | ORAL_TABLET | Freq: Once | ORAL | Status: AC
  Administered 2017-02-12: 5 mg via ORAL
  Filled 2017-02-12: qty 1

## 2017-02-12 MED ORDER — AMOXICILLIN-POT CLAVULANATE 875-125 MG PO TABS
1.0000 | ORAL_TABLET | Freq: Two times a day (BID) | ORAL | Status: DC
  Administered 2017-02-12 (×2): 1 via ORAL
  Filled 2017-02-12 (×2): qty 1

## 2017-02-12 MED ORDER — METOPROLOL TARTRATE 5 MG/5ML IV SOLN
10.0000 mg | Freq: Once | INTRAVENOUS | Status: AC
  Administered 2017-02-12: 10 mg via INTRAVENOUS

## 2017-02-12 MED ORDER — METOPROLOL TARTRATE 25 MG PO TABS
25.0000 mg | ORAL_TABLET | Freq: Four times a day (QID) | ORAL | Status: DC
  Administered 2017-02-12 – 2017-02-13 (×5): 25 mg via ORAL
  Filled 2017-02-12 (×5): qty 1

## 2017-02-12 MED ORDER — METOPROLOL TARTRATE 5 MG/5ML IV SOLN
INTRAVENOUS | Status: AC
  Filled 2017-02-12: qty 10

## 2017-02-12 MED ORDER — POTASSIUM CHLORIDE CRYS ER 20 MEQ PO TBCR
40.0000 meq | EXTENDED_RELEASE_TABLET | Freq: Once | ORAL | Status: AC
  Administered 2017-02-12: 40 meq via ORAL
  Filled 2017-02-12: qty 4

## 2017-02-12 NOTE — Progress Notes (Signed)
[facility]/Trauma Progress Note  3 Days Post-Op   Subjective:  CC: not hungry  Pt is having minimal abdominal pain. No nausea or vomiting. Tolerating diet and having flatus.   Objective: Vital signs in last 24 hours: Temp:  [98.1 F (36.7 C)-98.4 F (36.9 C)] 98.1 F (36.7 C) (06/25 0346) Pulse Rate:  [81-103] 103 (06/25 0346) Resp:  [0-32] 25 (06/25 0442) BP: (121-151)/(82-115) 151/109 (06/25 0439) SpO2:  [92 %-95 %] 95 % (06/25 0346) Weight:  [216 lb 14.4 oz (98.4 kg)] 216 lb 14.4 oz (98.4 kg) (06/25 0346) Last BM Date: 02/11/17  Intake/Output from previous day: 06/24 0701 - 06/25 0700 In: 300 [P.O.:300] Out: 55 [Drains:55] Intake/Output this shift: No intake/output data recorded.  PE: Gen:  Alert, NAD, pleasant, cooperative, well appearing Card: tachycardia, irregular rhythm, no M/G/R heard Pulm:  Rate and effort normal Abd: Soft, not distended, +BS, incisions C/D/I, drain with minimal serosanguinous drainage, mild TTP near drain site Skin: no rashes noted, warm and dry Extremities: mild pitting edema of BLE, no TTP  Lab Results:   Recent Labs  02/11/17 0308 02/12/17 0426  WBC 15.7* 9.5  HGB 11.7* 11.7*  HCT 34.5* 34.8*  PLT 198 218   BMET  Recent Labs  02/11/17 0308 02/12/17 0426  NA 137 137  K 3.9 3.5  CL 110 111  CO2 20* 20*  GLUCOSE 148* 112*  BUN 18 15  CREATININE 0.76 0.70  CALCIUM 7.9* 7.5*   PT/INR  Recent Labs  02/11/17 0308 02/12/17 0426  LABPROT 14.7 17.0*  INR 1.15 1.38   CMP     Component Value Date/Time   NA 137 02/12/2017 0426   K 3.5 02/12/2017 0426   CL 111 02/12/2017 0426   CO2 20 (L) 02/12/2017 0426   GLUCOSE 112 (H) 02/12/2017 0426   BUN 15 02/12/2017 0426   CREATININE 0.70 02/12/2017 0426   CALCIUM 7.5 (L) 02/12/2017 0426   PROT 5.7 (L) 02/11/2017 0308   ALBUMIN 2.5 (L) 02/11/2017 0308   AST 24 02/11/2017 0308   ALT 35 02/11/2017 0308   ALKPHOS 63 02/11/2017 0308   BILITOT 0.5 02/11/2017  0308   GFRNONAA >60 02/12/2017 0426   GFRAA >60 02/12/2017 0426   Lipase     Component Value Date/Time   LIPASE 18 02/08/2017 0515    Studies/Results: No results found.  Anti-infectives: Anti-infectives    Start     Dose/Rate Route Frequency Ordered Stop   02/12/17 1400  metroNIDAZOLE (FLAGYL) tablet 500 mg     500 mg Oral Every 8 hours 02/12/17 0838     02/12/17 0845  metroNIDAZOLE (FLAGYL) tablet 500 mg  Status:  Discontinued     500 mg Oral Every 8 hours 02/12/17 0834 02/12/17 0838   02/11/17 1500  cefTRIAXone (ROCEPHIN) 2 g in dextrose 5 % 50 mL IVPB    Comments:  Pharmacy may adjust dose as appropriate   2 g 100 mL/hr over 30 Minutes Intravenous Every 24 hours 02/11/17 1325     02/08/17 1800  vancomycin (VANCOCIN) IVPB 750 mg/150 ml premix  Status:  Discontinued     750 mg 150 mL/hr over 60 Minutes Intravenous Every 12 hours 02/08/17 1145 02/10/17 1615   02/08/17 1300  piperacillin-tazobactam (ZOSYN) IVPB 3.375 g  Status:  Discontinued     3.375 g 12.5 mL/hr over 240 Minutes Intravenous Every 8 hours 02/08/17 1145 02/11/17 1325   02/08/17 0900  metroNIDAZOLE (FLAGYL) IVPB 500 mg  Status:  Discontinued  500 mg 100 mL/hr over 60 Minutes Intravenous Every 8 hours 02/08/17 0737 02/12/17 0834   02/08/17 0445  vancomycin (VANCOCIN) IVPB 1000 mg/200 mL premix     1,000 mg 200 mL/hr over 60 Minutes Intravenous  Once 02/08/17 0430 02/08/17 5885   02/08/17 0445  piperacillin-tazobactam (ZOSYN) IVPB 3.375 g     3.375 g 100 mL/hr over 30 Minutes Intravenous  Once 02/08/17 0430 02/08/17 0525       Assessment/Plan Severe acute suppurative cholecystitis s/p Procedure(s): LAPAROSCOPIC CHOLECYSTECTOMY WITH INTRAOPERATIVE CHOLANGIOGRAM (N/A) 6/22 Dr Georgette Dover  New onset Afib with RVR - cards following and appreciate their recs Elevated Troponin - trending down  Plan: will pull drain prior to discharge, transition to PO Augmentin for 1 week total of abx, pt is clear for  discharge from a surgical standpoint.  Ambulate, pulm toilet   LOS: 4 days    Kalman Drape , [facility] 02/12/2017, 9:37 AM Pager: [phone] Consults: [phone] Mon-Fri 7:00 am-4:30 pm Sat-Sun 7:00 am-11:30 am

## 2017-02-12 NOTE — Progress Notes (Signed)
[place] for Heparin / Warfarin Indication: atrial fibrillation  No Known Allergies  Patient Measurements: Height: 5\' 10"  (177.8 cm) Weight: 216 lb 14.4 oz (98.4 kg) IBW/kg (Calculated) : 73 Heparin Dosing Weight: 84kg  Vital Signs: Temp: 98.1 F (36.7 C) (06/25 0346) BP: 151/109 (06/25 0439) Pulse Rate: 103 (06/25 0346)  Labs:  Recent Labs  02/09/17 1014 02/09/17 1812  02/10/17 0047 02/10/17 0955  02/11/17 0308 02/11/17 1319 02/11/17 2248 02/12/17 0426  HGB  --   --   < > 11.4*  --   --  11.7*  --   --  11.7*  HCT  --   --   --  33.0*  --   --  34.5*  --   --  34.8*  PLT  --   --   --  156  --   --  198  --   --  218  LABPROT  --   --   --   --  14.1  --  14.7  --   --  17.0*  INR  --   --   --   --  1.08  --  1.15  --   --  1.38  HEPARINUNFRC  --   --   --   --   --   < > 0.12* 0.24* 0.36 0.32  CREATININE  --   --   --  0.78  --   --  0.76  --   --  0.70  TROPONINI 0.93* 0.69*  --  0.55*  --   --   --   --   --   --   < > = values in this interval not displayed.  Estimated Creatinine Clearance: 83.8 mL/min (by C-G formula based on SCr of 0.7 mg/dL).  Assessment: 83 year old male continues on heparin and warfarin for Afib INR trending up slowly = 1.38 today Heparin level therapeutic  Goal of Therapy:  INR 2-3 Heparin level 0.3-0.7 units/ml Monitor platelets by anticoagulation protocol: Yes   Plan:  Continue heparin at 1850 units / hr Repeat warfarin 5 mg po x 1 Daily labs  Thank you Anette Guarneri, PharmD [phone]  02/12/2017 8:32 AM

## 2017-02-12 NOTE — Progress Notes (Signed)
[facility] TEAM 1 - Stepdown/ICU TEAM  Dan Scearce Derflinger  TLX:[id number] DOB: 1931/09/28 DOA: 02/08/2017 PCP: Lavone Orn, MD    Brief Narrative:  83 y.o.M Hx HTN, Asthma, and SBO who presented to the ED w/ nausea andabdom pain. In the ED CT revealed inflamed bowel and gallbladder. Right upper quadrant ultrasound showed acute cholecystitis.  Subjective: Experienced some RVR this AM, which responded to a one time dose of IV BB + early admit of his CCB.  The patient denies chest pain fevers chills nausea or vomiting.  He has begun to move his bowels.  He is in good spirits.  He is tolerating oral intake without difficulty.  Assessment & Plan:  Acute severe suppurative cholecystitis - E coli bacteremia Status post laparoscopic cholecystectomy 6/22 - postop care per General Surgery - doing well and tolerating diet - Gen Surg to remove drain when cleared for d/c from medical standpoint (likely 6/26 if HR controlled) - will need to complete a total of 14 days of antibiotic therapy given bacteremia  Newly diagnosed atrial fibrillation with RVR Cardiology following - heparin being transitioned to Coumadin with later consideration to [place] as outpt v/s DCCV - to f/u as outpt at [place]   Elevated troponin Cardiology following - to have Lexi scan Myoview prior to discharge or possibly as outpt - trop peaked at 1.07 - asymtomatic   Hypokalemia Supplement further today - should soon stabilize with resumption of diet  Asthma Quiescent  HTN Blood pressure presently controlled  DVT prophylaxis: lovenox  Code Status: FULL CODE Family Communication: No family present at time of visit Disposition Plan: Anticipate discharge home 6/26 if heart rate remains controlled overnight - surgical drain to be removed prior to discharge - anticoagulation/follow-up per cardiology  Consultants:  General surgery [facility] cardiology  Procedures: 6/22 TTE - EF 45-50% - diffuse hypokinesis   Antimicrobials:    Flagyl 6/21 > Rocephin 6/24 > Zosyn 6/20 > 6/24 Vancomycin 6/20 > 6/22  Objective: Blood pressure (!) 144/97, pulse 96, temperature 97.9 F (36.6 C), temperature source Oral, resp. rate (!) 26, height 5\' 10"  (1.778 m), weight 98.4 kg (216 lb 14.4 oz), SpO2 98 %.  Intake/Output Summary (Last 24 hours) at 02/12/17 1639 Last data filed at 02/12/17 1500  Gross per 24 hour  Intake              234 ml  Output              157 ml  Net               77 ml   Filed Weights   02/10/17 0343 02/11/17 0410 02/12/17 0346  Weight: 84.4 kg (186 lb) 97.3 kg (214 lb 9.6 oz) 98.4 kg (216 lb 14.4 oz)    Examination: General: No acute respiratory distress - alert  Lungs: Clear to auscultation B w/o wheezing  Cardiovascular: Irregularly irregular w/ rate of 90bpm Extremities: No significant edema B LE   CBC:  Recent Labs Lab 02/08/17 0344  02/08/17 1656 02/09/17 0327 02/10/17 0047 02/11/17 0308 02/12/17 0426  WBC 9.6  < > 14.7* 11.7* 12.0* 15.7* 9.5  NEUTROABS 9.2*  --   --   --  11.1*  --   --   HGB 12.7*  < > 12.2* 11.1* 11.4* 11.7* 11.7*  HCT 36.9*  < > 36.4* 32.7* 33.0* 34.5* 34.8*  MCV 94.9  < > 97.3 97.3 95.7 95.0 94.6  PLT 200  < > 153  155 156 198 218  < > = values in this interval not displayed. Basic Metabolic Panel:  Recent Labs Lab 02/08/17 0516 02/08/17 0535 02/08/17 0805 02/08/17 1656 02/09/17 0327 02/10/17 0047 02/11/17 0308 02/12/17 0426  NA 134* 136  --   --  135 134* 137 137  K 3.0* 3.1*  --   --  3.2* 3.6 3.9 3.5  CL 106 102  --   --  110 109 110 111  CO2 17*  --   --   --  21* 17* 20* 20*  GLUCOSE 104* 102*  --   --  113* 166* 148* 112*  BUN 15 16  --   --  13 14 18 15   CREATININE 1.01 0.80 0.99  --  0.81 0.78 0.76 0.70  CALCIUM 7.7*  --   --   --  7.6* 7.5* 7.9* 7.5*  MG  --   --   --  1.9  --  2.0  --   --   PHOS  --   --   --  2.6  --   --   --   --    GFR: Estimated Creatinine Clearance: 83.8 mL/min (by C-G formula based on SCr of 0.7  mg/dL).  Liver Function Tests:  Recent Labs Lab 02/08/17 0516 02/09/17 0327 02/10/17 0047 02/11/17 0308  AST 28 26 54* 24  ALT 14* 22 43 35  ALKPHOS 51 51 64 63  BILITOT 1.3* 0.8 0.3 0.5  PROT 5.6* 5.2* 5.6* 5.7*  ALBUMIN 2.7* 2.4* 2.4* 2.5*    Recent Labs Lab 02/08/17 0515  LIPASE 18   Coagulation Profile:  Recent Labs Lab 02/10/17 0955 02/11/17 0308 02/12/17 0426  INR 1.08 1.15 1.38    Cardiac Enzymes:  Recent Labs Lab 02/08/17 1656 02/09/17 1014 02/09/17 1812 02/10/17 0047  TROPONINI 1.07* 0.93* 0.69* 0.55*    HbA1C: Hgb A1c MFr Bld  Date/Time Value Ref Range Status  01/03/2016 06:08 PM 5.4 4.8 - 5.6 % Final    Comment:    (NOTE)         Pre-diabetes: 5.7 - 6.4         Diabetes: >6.4         Glycemic control for adults with diabetes: <7.0     Recent Results (from the past 240 hour(s))  Blood Culture (routine x 2)     Status: Abnormal (Preliminary result)   Collection Time: 02/08/17  4:10 AM  Result Value Ref Range Status   Specimen Description BLOOD RIGHT HAND  Final   Special Requests   Final    BOTTLES DRAWN AEROBIC AND ANAEROBIC Blood Culture adequate volume   Culture  Setup Time   Final    GRAM NEGATIVE RODS ANAEROBIC BOTTLE ONLY CRITICAL RESULT CALLED TO, READ BACK BY AND VERIFIED WITH: A JONES,PHARMD AT 5027 02/11/17 BY L BENFIELD    Culture ESCHERICHIA COLI SUSCEPTIBILITIES TO FOLLOW  (A)  Final   Report Status PENDING  Incomplete  Blood Culture ID Panel (Reflexed)     Status: Abnormal   Collection Time: 02/08/17  4:10 AM  Result Value Ref Range Status   Enterococcus species NOT DETECTED NOT DETECTED Final   Listeria monocytogenes NOT DETECTED NOT DETECTED Final   Staphylococcus species NOT DETECTED NOT DETECTED Final   Staphylococcus aureus NOT DETECTED NOT DETECTED Final   Streptococcus species NOT DETECTED NOT DETECTED Final   Streptococcus agalactiae NOT DETECTED NOT DETECTED Final   Streptococcus pneumoniae NOT DETECTED  NOT  DETECTED Final   Streptococcus pyogenes NOT DETECTED NOT DETECTED Final   Acinetobacter baumannii NOT DETECTED NOT DETECTED Final   Enterobacteriaceae species DETECTED (A) NOT DETECTED Final    Comment: Enterobacteriaceae represent a large family of gram-negative bacteria, not a single organism. CRITICAL RESULT CALLED TO, READ BACK BY AND VERIFIED WITH: A JONES,PHARMD AT 1301 02/11/17 BY L BENFIELD    Enterobacter cloacae complex NOT DETECTED NOT DETECTED Final   Escherichia coli DETECTED (A) NOT DETECTED Final    Comment: CRITICAL RESULT CALLED TO, READ BACK BY AND VERIFIED WITH: A JONES,PHARMD AT 1301 02/11/17 BY L BENFIELD    Klebsiella oxytoca NOT DETECTED NOT DETECTED Final   Klebsiella pneumoniae NOT DETECTED NOT DETECTED Final   Proteus species NOT DETECTED NOT DETECTED Final   Serratia marcescens NOT DETECTED NOT DETECTED Final   Carbapenem resistance NOT DETECTED NOT DETECTED Final   Haemophilus influenzae NOT DETECTED NOT DETECTED Final   Neisseria meningitidis NOT DETECTED NOT DETECTED Final   Pseudomonas aeruginosa NOT DETECTED NOT DETECTED Final   Candida albicans NOT DETECTED NOT DETECTED Final   Candida glabrata NOT DETECTED NOT DETECTED Final   Candida krusei NOT DETECTED NOT DETECTED Final   Candida parapsilosis NOT DETECTED NOT DETECTED Final   Candida tropicalis NOT DETECTED NOT DETECTED Final  Blood Culture (routine x 2)     Status: None (Preliminary result)   Collection Time: 02/08/17  4:21 AM  Result Value Ref Range Status   Specimen Description BLOOD LEFT HAND  Final   Special Requests   Final    BOTTLES DRAWN AEROBIC AND ANAEROBIC Blood Culture adequate volume   Culture NO GROWTH 4 DAYS  Final   Report Status PENDING  Incomplete  Urine culture     Status: None   Collection Time: 02/08/17  5:23 AM  Result Value Ref Range Status   Specimen Description URINE, CATHETERIZED  Final   Special Requests NONE  Final   Culture NO GROWTH  Final   Report Status  02/09/2017 FINAL  Final  Surgical pcr screen     Status: None   Collection Time: 02/08/17  2:09 PM  Result Value Ref Range Status   MRSA, PCR NEGATIVE NEGATIVE Final   Staphylococcus aureus NEGATIVE NEGATIVE Final    Comment:        The Xpert SA Assay (FDA approved for NASAL specimens in patients over 38 years of age), is one component of a comprehensive surveillance program.  Test performance has been validated by [facility] for patients greater than or equal to 70 year old. It is not intended to diagnose infection nor to guide or monitor treatment.      Scheduled Meds: . amoxicillin-clavulanate  1 tablet Oral Q12H  . aspirin EC  81 mg Oral Daily  . budesonide (PULMICORT) nebulizer solution  0.25 mg Nebulization BID  . diltiazem  240 mg Oral Daily  . metoprolol tartrate  25 mg Oral Q6H  . sodium chloride flush  3 mL Intravenous Q12H  . warfarin  5 mg Oral ONCE-1800  . warfarin   Does not apply Once  . Warfarin - Pharmacist Dosing Inpatient   Does not apply q1800     LOS: 4 days   Cherene Altes, MD [facility] Office  [phone] Pager - Text Page per Amion as per below:  On-Call/Text Page:      [email or web address]      password [email or web address]  If 7PM-7AM, please contact night-coverage [email or web address] Password [facility] 02/12/2017, 4:39 PM

## 2017-02-12 NOTE — Progress Notes (Signed)
Progress Note  Patient Name: Daniel Yu Date of Encounter: 02/12/2017  Primary Cardiologist: Dr. Stanford Breed   Subjective   Feels fine. Asymptomatic with his afib. No palpitations, dyspnea nor chest pain. He does not feel lightheaded or dizzy. No abdominal pain but not much of an appetite.   Inpatient Medications    Scheduled Meds: . aspirin EC  81 mg Oral Daily  . budesonide (PULMICORT) nebulizer solution  0.25 mg Nebulization BID  . diltiazem  240 mg Oral Daily  . metroNIDAZOLE  500 mg Oral Q8H  . sodium chloride flush  3 mL Intravenous Q12H  . warfarin  5 mg Oral ONCE-1800  . warfarin   Does not apply Once  . Warfarin - Pharmacist Dosing Inpatient   Does not apply q1800   Continuous Infusions: . sodium chloride 10 mL/hr at 02/11/17 1459  . cefTRIAXone (ROCEPHIN)  IV Stopped (02/11/17 1444)  . heparin 1,850 Units/hr (02/11/17 1917)   PRN Meds: acetaminophen **OR** acetaminophen, HYDROcodone-acetaminophen, hydroxypropyl methylcellulose / hypromellose, ketorolac, levalbuterol, morphine injection, ondansetron **OR** ondansetron (ZOFRAN) IV   Vital Signs    Vitals:   02/12/17 0435 02/12/17 0439 02/12/17 0440 02/12/17 0442  BP: (S) (!) 141/115 (!) 151/109    Pulse:      Resp: 14  (!) 28 (!) 25  Temp:      TempSrc:      SpO2:      Weight:      Height:        Intake/Output Summary (Last 24 hours) at 02/12/17 0857 Last data filed at 02/12/17 0346  Gross per 24 hour  Intake                0 ml  Output               55 ml  Net              -55 ml   Filed Weights   02/10/17 0343 02/11/17 0410 02/12/17 0346  Weight: 186 lb (84.4 kg) 214 lb 9.6 oz (97.3 kg) 216 lb 14.4 oz (98.4 kg)    Telemetry    Atrial fibrillation with RVR - Personally Reviewed  ECG    Atrial fibrillation  - Personally Reviewed  Physical Exam   GEN: No acute distress.   Neck: No JVD Cardiac: irregularlly irregular, no murmurs, rubs, or gallops.  Respiratory: faint diffuse bilateral  wheezing  GI: Soft, non-distended, s/p lab chole, still with surgical drain MS: No edema; No deformity. Neuro:  Nonfocal  Psych: Normal affect   Labs    Chemistry Recent Labs Lab 02/09/17 0327 02/10/17 0047 02/11/17 0308 02/12/17 0426  NA 135 134* 137 137  K 3.2* 3.6 3.9 3.5  CL 110 109 110 111  CO2 21* 17* 20* 20*  GLUCOSE 113* 166* 148* 112*  BUN 13 14 18 15   CREATININE 0.81 0.78 0.76 0.70  CALCIUM 7.6* 7.5* 7.9* 7.5*  PROT 5.2* 5.6* 5.7*  --   ALBUMIN 2.4* 2.4* 2.5*  --   AST 26 54* 24  --   ALT 22 43 35  --   ALKPHOS 51 64 63  --   BILITOT 0.8 0.3 0.5  --   GFRNONAA >60 >60 >60 >60  GFRAA >60 >60 >60 >60  ANIONGAP 4* 8 7 6      Hematology Recent Labs Lab 02/10/17 0047 02/11/17 0308 02/12/17 0426  WBC 12.0* 15.7* 9.5  RBC 3.45* 3.63* 3.68*  HGB 11.4* 11.7* 11.7*  HCT 33.0* 34.5* 34.8*  MCV 95.7 95.0 94.6  MCH 33.0 32.2 31.8  MCHC 34.5 33.9 33.6  RDW 13.5 13.2 13.3  PLT 156 198 218    Cardiac Enzymes Recent Labs Lab 02/08/17 1656 02/09/17 1014 02/09/17 1812 02/10/17 0047  TROPONINI 1.07* 0.93* 0.69* 0.55*    Recent Labs Lab 02/08/17 0424  TROPIPOC 0.03     BNPNo results for input(s): BNP, PROBNP in the last 168 hours.   DDimer No results for input(s): DDIMER in the last 168 hours.   Radiology    No results found.  Cardiac Studies   2D Echo 02/09/17 Study Conclusions  - Left ventricle: The cavity size was normal. There was mild   concentric hypertrophy. Systolic function was mildly reduced. The   estimated ejection fraction was in the range of 45% to 50%.   Diffuse hypokinesis. - Aortic valve: Transvalvular velocity was within the normal range.   There was no stenosis. There was no regurgitation. - Mitral valve: Transvalvular velocity was within the normal range.   There was no evidence for stenosis. There was mild regurgitation. - Left atrium: The atrium was mildly dilated. - Right ventricle: The cavity size was normal. Wall  thickness was   normal. Systolic function was normal. - Tricuspid valve: There was mild regurgitation. - Pulmonary arteries: Systolic pressure was within the normal   range. PA peak pressure: 32 mm Hg (S).  Impressions:  - Systolic function is mildly reduce. This may be due to   tachycardia and atrial fibrillation. Consider repeating a   limitied echo at lowe heart rates to better assess systolic   function.   Patient Profile     Daniel Yu has a PMH significant for asthma, HTN, and SBO (12/2015). He presented to [facility] with complaints of nausea and constipation. Code sepsis was called and he was found to be in Afib RVR. Also discovered to have acute cholecystitis>> Status post laparoscopic cholecystectomy 6/22. Troponin was elevated at 1.07, however completely asymptomatic w/o EKG changes. ? Demand ischemia. Plan is for outpatient NST.    Assessment & Plan    1. Newly Diagnosed Atrial Fibrillation w/ RVR: in the setting of acute cholecystitis. Now s/p lap chole. 2D echo showed EF of 45-50%. TSH, Mg and K all WNL. He remains in afib w/ variable ventricular responses, alternating from the 80s-120s. He is on Cardizem 240 mg. He needs additional rate control prior to discharge. Can further increase Cardizem to 360 mg. However given slight LV dysfunction, may consider addition of BB therapy. Will defer to MD. Continue Coumadin for a/c. He is being bridged with heparin. Consider OP DCCV after 4 weeks of consecutive INRs, if still in afib.    2. Elevated Troponin: level peaked at 1.07. Level trending downward. No significant EKG changes. He has remained completely asymptomatic. EF 45-50% by echo. ? Demand ischemia from rapid afib and acute cholecystitis. Plan is for outpatient NST. We will arrange.   3. Acute Cholecystitis: s/p lap chole on 6/22. Post operative management per general surgery.   4. HTN: BP is elevated this am. We will further increase his rate control agents, which will also help  with his BP. Will discuss with MD, either increasing Cardizem to 360 mg +/- addition of a BB. Continue to monitor.      Signed, Lyda Jester, PA-C  02/12/2017, 8:57 AM    History and all data above reviewed.  Patient examined.  I agree with the findings as above.  The  patient exam reveals MAU:[id number]  ,  Lungs: Clear  ,  Abd: Positive bowel sounds, no rebound no guarding, Ext Moderate leg edema  .  All available labs, radiology testing, previous records reviewed. Agree with documented assessment and plan. Atrial fib:  Rate is still high.  I am going to add metoprolol tartrate today and this can be consolidated in the AM.  I suspect his rate will be adequate for discharge tomorrow.  Continue current anticoagulation.   Jeneen Rinks [facility]  10:36 AM  02/12/2017

## 2017-02-12 NOTE — Progress Notes (Addendum)
Paged cardiology attending Oval Linsey MD of HR of 145 sustaining

## 2017-02-12 NOTE — Care Management Important Message (Signed)
Important Message  Patient Details  Name: Daniel Yu MRN: [id number] Date of Birth: 1932-01-24   Medicare Important Message Given:  Yes    [name] Abena 02/12/2017, 11:06 AM

## 2017-02-12 NOTE — Progress Notes (Signed)
Paged cardiology Oval Linsey MD returned page. Provider to round on patient momentarily.

## 2017-02-12 NOTE — Progress Notes (Addendum)
Paged mcclung J MD of HR sustaining at 145 provider returned page this RN to enter and admin  10mg  of lopressor once IVP and early admin of Cardizem cd

## 2017-02-12 NOTE — Progress Notes (Signed)
Paged cardiology regarding afib rate ( HR 113-130 ) and elevated  Systolic BP 937/342.  Return page Eula Fried MD, no new orders at this time

## 2017-02-13 DIAGNOSIS — E876 Hypokalemia: Secondary | ICD-10-CM

## 2017-02-13 DIAGNOSIS — I5021 Acute systolic (congestive) heart failure: Secondary | ICD-10-CM

## 2017-02-13 DIAGNOSIS — A4151 Sepsis due to Escherichia coli [E. coli]: Principal | ICD-10-CM

## 2017-02-13 DIAGNOSIS — R7881 Bacteremia: Secondary | ICD-10-CM

## 2017-02-13 LAB — BASIC METABOLIC PANEL
Anion gap: 8 (ref 5–15)
BUN: 9 mg/dL (ref 6–20)
CHLORIDE: 107 mmol/L (ref 101–111)
CO2: 19 mmol/L — ABNORMAL LOW (ref 22–32)
CREATININE: 0.64 mg/dL (ref 0.61–1.24)
Calcium: 7.4 mg/dL — ABNORMAL LOW (ref 8.9–10.3)
GFR calc Af Amer: >60 mL/min (ref >60)
GFR calc non Af Amer: >60 mL/min (ref >60)
Glucose, Bld: 113 mg/dL — ABNORMAL HIGH (ref 65–99)
Potassium: 3.3 mmol/L — ABNORMAL LOW (ref 3.5–5.1)
Sodium: 134 mmol/L — ABNORMAL LOW (ref 135–145)

## 2017-02-13 LAB — HEPARIN LEVEL (UNFRACTIONATED): Heparin Unfractionated: 0.38 IU/mL (ref 0.30–0.70)

## 2017-02-13 LAB — CULTURE, BLOOD (ROUTINE X 2)
Culture: NO GROWTH
SPECIAL REQUESTS: ADEQUATE
SPECIAL REQUESTS: ADEQUATE

## 2017-02-13 LAB — PROTIME-INR
INR: 1.72
Prothrombin Time: 20.4 seconds — ABNORMAL HIGH (ref 11.4–15.2)

## 2017-02-13 LAB — CBC
HEMATOCRIT: 35.2 % — AB (ref 39.0–52.0)
HEMOGLOBIN: 12 g/dL — AB (ref 13.0–17.0)
MCH: 31.9 pg (ref 26.0–34.0)
MCHC: 34.1 g/dL (ref 30.0–36.0)
MCV: 93.6 fL (ref 78.0–100.0)
Platelets: 277 10*3/uL (ref 150–400)
RBC: 3.76 MIL/uL — ABNORMAL LOW (ref 4.22–5.81)
RDW: 13.2 % (ref 11.5–15.5)
WBC: 9.3 10*3/uL (ref 4.0–10.5)

## 2017-02-13 LAB — POTASSIUM: Potassium: 3.4 mmol/L — ABNORMAL LOW (ref 3.5–5.1)

## 2017-02-13 LAB — MAGNESIUM: Magnesium: 1.8 mg/dL (ref 1.7–2.4)

## 2017-02-13 MED ORDER — DILTIAZEM HCL ER COATED BEADS 120 MG PO CP24
120.0000 mg | ORAL_CAPSULE | Freq: Once | ORAL | Status: AC
  Administered 2017-02-13: 120 mg via ORAL
  Filled 2017-02-13: qty 1

## 2017-02-13 MED ORDER — DILTIAZEM HCL ER COATED BEADS 180 MG PO CP24
360.0000 mg | ORAL_CAPSULE | Freq: Every day | ORAL | Status: DC
  Administered 2017-02-14 – 2017-02-15 (×2): 360 mg via ORAL
  Filled 2017-02-13 (×2): qty 2

## 2017-02-13 MED ORDER — SODIUM CHLORIDE 0.9 % IV SOLN: INTRAVENOUS | Status: DC

## 2017-02-13 MED ORDER — WARFARIN SODIUM 5 MG PO TABS
5.0000 mg | ORAL_TABLET | Freq: Once | ORAL | Status: AC
  Administered 2017-02-13: 5 mg via ORAL
  Filled 2017-02-13: qty 1

## 2017-02-13 MED ORDER — POTASSIUM CHLORIDE CRYS ER 20 MEQ PO TBCR
50.0000 meq | EXTENDED_RELEASE_TABLET | Freq: Once | ORAL | Status: AC
  Administered 2017-02-13: 11:00:00 50 meq via ORAL
  Filled 2017-02-13: qty 1

## 2017-02-13 MED ORDER — METOPROLOL TARTRATE 50 MG PO TABS
50.0000 mg | ORAL_TABLET | Freq: Four times a day (QID) | ORAL | Status: DC
  Administered 2017-02-13 – 2017-02-14 (×3): 50 mg via ORAL
  Filled 2017-02-13 (×3): qty 1

## 2017-02-13 MED ORDER — CIPROFLOXACIN HCL 500 MG PO TABS
500.0000 mg | ORAL_TABLET | Freq: Two times a day (BID) | ORAL | Status: DC
  Administered 2017-02-13 – 2017-02-15 (×5): 500 mg via ORAL
  Filled 2017-02-13 (×5): qty 1

## 2017-02-13 MED ORDER — SODIUM CHLORIDE 0.9% FLUSH: 3.0000 mL | Freq: Two times a day (BID) | INTRAVENOUS | Status: DC

## 2017-02-13 MED ORDER — SODIUM CHLORIDE 0.9 % IV SOLN: 250.0000 mL | INTRAVENOUS | Status: DC

## 2017-02-13 MED ORDER — SODIUM CHLORIDE 0.9% FLUSH: 3.0000 mL | INTRAVENOUS | Status: DC | PRN

## 2017-02-13 NOTE — Progress Notes (Signed)
[place] for Heparin / Warfarin Indication: atrial fibrillation  No Known Allergies  Patient Measurements: Height: 5\' 10"  (177.8 cm) Weight: 212 lb 12.8 oz (96.5 kg) IBW/kg (Calculated) : 73 Heparin Dosing Weight: 84kg  Vital Signs: Temp: 97.9 F (36.6 C) (06/26 0900) Temp Source: Oral (06/26 0900) BP: 141/102 (06/26 0900) Pulse Rate: 126 (06/26 0423)  Labs:  Recent Labs  02/11/17 0308  02/11/17 2248 02/12/17 0426 02/13/17 0438  HGB 11.7*  --   --  11.7* 12.0*  HCT 34.5*  --   --  34.8* 35.2*  PLT 198  --   --  218 277  LABPROT 14.7  --   --  17.0* 20.4*  INR 1.15  --   --  1.38 1.72  HEPARINUNFRC 0.12*  < > 0.36 0.32 0.38  CREATININE 0.76  --   --  0.70 0.64  < > = values in this interval not displayed.  Estimated Creatinine Clearance: 83 mL/min (by C-G formula based on SCr of 0.64 mg/dL).  Assessment: 83 year old male continues on heparin and warfarin for Afib INR trending up = 1.72 today Cipro may cause increase in INR Heparin level therapeutic  Goal of Therapy:  INR 2-3 Heparin level 0.3-0.7 units/ml Monitor platelets by anticoagulation protocol: Yes   Plan:  Continue heparin at 1850 units / hr Repeat warfarin 5 mg po x 1 Daily labs  Thank you Anette Guarneri, PharmD [phone]  02/13/2017 9:57 AM

## 2017-02-13 NOTE — Progress Notes (Signed)
Progress Note  Patient Name: Daniel Yu Date of Encounter: 02/13/2017  Primary Cardiologist: Dr. Stanford Breed   Subjective   Asymptomatic. No palpitations, CP or dyspnea. He is improving from a surgical standpoint. Drain was removed today.   Inpatient Medications    Scheduled Meds: . aspirin EC  81 mg Oral Daily  . budesonide (PULMICORT) nebulizer solution  0.25 mg Nebulization BID  . ciprofloxacin  500 mg Oral BID  . diltiazem  240 mg Oral Daily  . metoprolol tartrate  25 mg Oral Q6H  . potassium chloride  50 mEq Oral Once  . sodium chloride flush  3 mL Intravenous Q12H  . warfarin  5 mg Oral ONCE-1800  . warfarin   Does not apply Once  . Warfarin - Pharmacist Dosing Inpatient   Does not apply q1800   Continuous Infusions: . heparin 1,850 Units/hr (02/13/17 0000)   PRN Meds: acetaminophen **OR** acetaminophen, HYDROcodone-acetaminophen, hydroxypropyl methylcellulose / hypromellose, levalbuterol, morphine injection, ondansetron **OR** ondansetron (ZOFRAN) IV   Vital Signs    Vitals:   02/13/17 0423 02/13/17 0525 02/13/17 0813 02/13/17 0900  BP: (!) 148/95 (!) 139/95  (!) 141/102  Pulse: (!) 126     Resp:  18    Temp:    97.9 F (36.6 C)  TempSrc:    Oral  SpO2:   97% 97%  Weight:      Height:        Intake/Output Summary (Last 24 hours) at 02/13/17 1019 Last data filed at 02/13/17 0600  Gross per 24 hour  Intake             1110 ml  Output              187 ml  Net              923 ml   Filed Weights   02/11/17 0410 02/12/17 0346 02/13/17 0412  Weight: 214 lb 9.6 oz (97.3 kg) 216 lb 14.4 oz (98.4 kg) 212 lb 12.8 oz (96.5 kg)    Telemetry    Atrial fibrillation w/ RVR - Personally Reviewed  ECG    Atrial fibrillation  - Personally Reviewed  Physical Exam   GEN: No acute distress.   Neck: No JVD Cardiac: irregular irregular, tachy rate, no murmurs, rubs, or gallops.  Respiratory: Clear to auscultation bilaterally. GI: Soft, nontender,  non-distended  MS: No edema; No deformity. Neuro:  Nonfocal  Psych: Normal affect   Labs    Chemistry Recent Labs Lab 02/09/17 0327 02/10/17 0047 02/11/17 0308 02/12/17 0426 02/13/17 0438  NA 135 134* 137 137 134*  K 3.2* 3.6 3.9 3.5 3.3*  CL 110 109 110 111 107  CO2 21* 17* 20* 20* 19*  GLUCOSE 113* 166* 148* 112* 113*  BUN 13 14 18 15 9   CREATININE 0.81 0.78 0.76 0.70 0.64  CALCIUM 7.6* 7.5* 7.9* 7.5* 7.4*  PROT 5.2* 5.6* 5.7*  --   --   ALBUMIN 2.4* 2.4* 2.5*  --   --   AST 26 54* 24  --   --   ALT 22 43 35  --   --   ALKPHOS 51 64 63  --   --   BILITOT 0.8 0.3 0.5  --   --   GFRNONAA >60 >60 >60 >60 >60  GFRAA >60 >60 >60 >60 >60  ANIONGAP 4* 8 7 6 8      Hematology Recent Labs Lab 02/11/17 0308 02/12/17 4174 02/13/17 0814  WBC 15.7* 9.5 9.3  RBC 3.63* 3.68* 3.76*  HGB 11.7* 11.7* 12.0*  HCT 34.5* 34.8* 35.2*  MCV 95.0 94.6 93.6  MCH 32.2 31.8 31.9  MCHC 33.9 33.6 34.1  RDW 13.2 13.3 13.2  PLT 198 218 277    Cardiac Enzymes Recent Labs Lab 02/08/17 1656 02/09/17 1014 02/09/17 1812 02/10/17 0047  TROPONINI 1.07* 0.93* 0.69* 0.55*    Recent Labs Lab 02/08/17 0424  TROPIPOC 0.03     BNPNo results for input(s): BNP, PROBNP in the last 168 hours.   DDimer No results for input(s): DDIMER in the last 168 hours.   Radiology    No results found.  Cardiac Studies   2D Echo 02/09/17 Study Conclusions  - Left ventricle: The cavity size was normal. There was mild concentric hypertrophy. Systolic function was mildly reduced. The estimated ejection fraction was in the range of 45% to 50%. Diffuse hypokinesis. - Aortic valve: Transvalvular velocity was within the normal range. There was no stenosis. There was no regurgitation. - Mitral valve: Transvalvular velocity was within the normal range. There was no evidence for stenosis. There was mild regurgitation. - Left atrium: The atrium was mildly dilated. - Right ventricle: The cavity  size was normal. Wall thickness was normal. Systolic function was normal. - Tricuspid valve: There was mild regurgitation. - Pulmonary arteries: Systolic pressure was within the normal range. PA peak pressure: 32 mm Hg (S).  Impressions:  - Systolic function is mildly reduce. This may be due to tachycardia and atrial fibrillation. Consider repeating a limitied echo at lowe heart rates to better assess systolic function.  Patient Profile     Mr. Knick has a PMH significant for asthma, HTN, and SBO (12/2015). He presented to [facility] with complaints of nausea and constipation. Code sepsis was called and he was found to be in Afib RVR. Also discovered to have acute cholecystitis>> Status post laparoscopic cholecystectomy 6/22. Troponin was elevated at 1.07, however completely asymptomatic w/o EKG changes. ? Demand ischemia. Plan is for outpatient NST.   Assessment & Plan    1. Newly Diagnosed Atrial Fibrillation w/ RVR: in the setting of acute cholecystitis. Now s/p lap chole. 2D echo showed EF of 45-50%. LA mildly dilated. TSH, Mg WNL. He was hypokalemic this am, however he has received supplementation. His resting rate remains poorly controlled, now fluctuating in the 120s-140s. BP also elevated. He is asymptomatic. Metoprolol 25 mg Q6H was added yesterday, however he will need further increase in BB dose  and consider increasing Cardizem to 360 mg daily. Continue Coumadin for a/c. He is being bridged with heparin. Consider OP DCCV after 4 weeks of consecutive INRs, if still in afib.    2. Elevated Troponin: level peaked at 1.07. Level trending downward. No significant EKG changes. He has remained completely asymptomatic. EF 45-50% by echo. ? Demand ischemia from rapid afib and acute cholecystitis. Plan is for outpatient NST. We will arrange.   3. Acute Cholecystitis: s/p lap chole on 6/22. Surgical drain removed earlier today. Post operative management per general surgery.   4. HTN:  elevated. Diastolic BP has been in the low 100s. He needs additional rate control. Increase metoprolol further and possibly increase Cardizem to 360 mg, which will also help BP.   5. Hypokalemia: K 3.3. K-dur 50 mEq was given earlier today.   Signed, Lyda Jester, PA-C  02/13/2017, 10:19 AM    History and all data above reviewed.  Patient examined.  I agree with the findings as  above.  He is doing well but rate is still not well controlled.  The patient exam reveals JJH:ERDEYCXKG  ,  Lungs: Clear  ,  Abd: Positive bowel sounds, no rebound no guarding, Ext no edema  .  All available labs, radiology testing, previous records reviewed. Agree with documented assessment and plan. Atrial fib:  Rate is still not well controlled.  I will increase the beta blocker.  However, if he is not rate controlled he will need DCCV/TEE and we will arrange this possibly for tomorrow.  Needs to have INR greater than 2 before discharge.  Elevated Troponin with mild enzyme elevation.  I will plan out patient Lexiscan Myoview.   Jeneen Rinks Chandria Rookstool  11:56 AM  02/13/2017

## 2017-02-13 NOTE — Progress Notes (Signed)
PROGRESS NOTE    Daniel Yu  DGL:[id number] DOB: June 08, 1932 DOA: 02/08/2017 PCP: Lavone Orn, MD   Brief Narrative:  83 y.o. WM  PMHx HTN, Asthma ,SBO  Presents emergency room chief complaint of nausea and weakness. Patient states that over last 2 days he's had decreased appetite. Not able to tolerate liquids or solids without some degree nausea. This worsened over time. Patient also developed lower abdominal pain bilaterally. No trauma or sick contacts. Patient went to [facility] at lunch time and then began to have worsening abdominal discomfort. At home later pt went to the rest room and tried to vomit but couldn't. At one point he became weak and slid off the bed onto the floor. At this point his wife activated EMS.  Denies fever, chills, rash, altered mental status, chest pain. Positive for shortness of breath. Did not take his inhaler this morning.  ED course: Started on sepsis protocol. CT revealed inflamed bowel and gallbladder. Right upper quadrant ultrasound showed acute cholecystitis per general surgery. Hospitalists admitted patient.   Subjective: 6/26  A/O 4, negative CP, negative SOB, negative abdominal pain, negative N/V. Had not been informed he had CHF.    Assessment & Plan:   Principal Problem:   Sepsis ([place]) Active Problems:   HTN (hypertension)   Asthma   Arthritis   Abnormal abdominal CT scan  Sepsis Escherichia coli -Multifactorial to include Acute cholecystitis and Escherichia coli bacteremia -Urine culture pending, UA clean -Blood cultures: Positive Escherichia coli  Acute systolic CHF -Strict in and out -Daily weight -6/26 Increase Cardizem CD 360 mg daily first dose on 6/27 -Cardizem CD 120 mg 1 -Metoprolol 50 mg QID  New onset Atrial fibrillation -Continue poor control HR as high as 140  -Warfarin per pharmacy Recent Labs Lab 02/10/17 0955 02/11/17 0308 02/12/17 0426 02/13/17 0438  INR 1.08 1.15 1.38 1.72  -Still  subtherapeutic -Schedule follow-up appointment in CHF clinic for 2 weeks post discharge with Dr. Kirk Ruths -Schedule one week follow-up in Coumadin clinic for INR check -Spoke at length with patient and wife concerning use of Coumadin, and changing over to NOAC if desired once surgery clears use.  Hypertension -See CHF   Acute Cholecystitis with necrosis -Abnormal abdominal CT scan--> acute cholecystitis -Right upper quadrant ultrasound ordered and pending General surgery: Currently in OR/recovery   Escherichia coli bacteremia -Complete 2 weeks antibiotics  Hypokalemia -Potassium goal> 4 -K-Dur  50 mEq -Repeat K/Mg at 1400  Asthma -When necessary albuterol -Cont flovent  Anemia -Appears stable Recent Labs Lab 02/08/17 1656 02/09/17 0327 02/10/17 0047 02/11/17 0308 02/12/17 0426 02/13/17 0438  HGB 12.2* 11.1* 11.4* 11.7* 11.7* 12.0*    Allergies Hold zyrtec        DVT prophylaxis: Lovenox Code Status: Full Family Communication: Wife present at bedside Disposition Plan: Per surgery and cardiology   Consultants:  General surgery Dr. Donnie [place] Cardiology Dr. Kirk Ruths      Procedures/Significant Events:  6/21 CT abdomen pelvis W contrast:Findings suspicious for acute cholecystitis.  -Colonic wall thickening with liquid stool in the ascending colon, may be reactive vs less likely primary colitis.  -Dependent lower lobes densities in the lung bases, atelectasis on the left, indeterminate on the right for atelectasis vs pneumonia. 6/21 Ultrasound Abdomen;Gallbladder wall thickening with pericholecystic fluid and large amount of gallbladder sludge suspicious for acute cholecystitis  6/22 Echocardiogram:- LVEF = 45% to 50%., Diffuse hypokinesis. - Pulmonary arteries: PA peak pressure: 32 mm Hg (S).  VENTILATOR SETTINGS:  Cultures  6/21   1/2 blood positive Escherichia coli \ 6/21 urine negative final 6/21 MRSA by PCR  negative 6/22 Gallbladder:ACUTE CHOLECYSTITIS WITH EXTENSIVE NECROSIS AND ABSCESS    Antimicrobials:Anti-infectives    Start     Stop   02/13/17 0915  ciprofloxacin (CIPRO) tablet 500 mg     02/21/17 0759   02/12/17 1400  metroNIDAZOLE (FLAGYL) tablet 500 mg  Status:  Discontinued     02/12/17 1004   02/12/17 1030  amoxicillin-clavulanate (AUGMENTIN) 875-125 MG per tablet 1 tablet  Status:  Discontinued     02/13/17 0858   02/12/17 0845  metroNIDAZOLE (FLAGYL) tablet 500 mg  Status:  Discontinued     02/12/17 0838   02/11/17 1500  cefTRIAXone (ROCEPHIN) 2 g in dextrose 5 % 50 mL IVPB  Status:  Discontinued    Comments:  Pharmacy may adjust dose as appropriate   02/12/17 1004   02/08/17 1800  vancomycin (VANCOCIN) IVPB 750 mg/150 ml premix  Status:  Discontinued     02/10/17 1615   02/08/17 1300  piperacillin-tazobactam (ZOSYN) IVPB 3.375 g  Status:  Discontinued     02/11/17 1325   02/08/17 0900  metroNIDAZOLE (FLAGYL) IVPB 500 mg  Status:  Discontinued     02/12/17 0834   02/08/17 0445  vancomycin (VANCOCIN) IVPB 1000 mg/200 mL premix     02/08/17 0633   02/08/17 0445  piperacillin-tazobactam (ZOSYN) IVPB 3.375 g     02/08/17 0525       Devices    LINES / TUBES:      Continuous Infusions: . heparin 1,850 Units/hr (02/13/17 0000)     Objective: Vitals:   02/13/17 0033 02/13/17 0412 02/13/17 0423 02/13/17 0525  BP: 126/82 (!) 148/95 (!) 148/95 (!) 139/95  Pulse: 90  (!) 126   Resp: (!) 26   18  Temp:  98.2 F (36.8 C)    TempSrc:  Oral    SpO2: 99% 97%    Weight:  212 lb 12.8 oz (96.5 kg)    Height:        Intake/Output Summary (Last 24 hours) at 02/13/17 0750 Last data filed at 02/13/17 0600  Gross per 24 hour  Intake           1288.5 ml  Output              189 ml  Net           1099.5 ml   Filed Weights   02/11/17 0410 02/12/17 0346 02/13/17 0412  Weight: 214 lb 9.6 oz (97.3 kg) 216 lb 14.4 oz (98.4 kg) 212 lb 12.8 oz (96.5 kg)     Examination:  General: A/O 4, No acute respiratory distress Eyes: negative scleral hemorrhage, negative anisocoria, negative icterus ENT: Negative Runny nose, negative gingival bleeding, Neck:  Negative scars, masses, torticollis, lymphadenopathy, JVD Lungs: Clear to auscultation bilaterally without wheezes or crackles Cardiovascular: Regular rate and rhythm without murmur gallop or rub normal S1 and S2 Abdomen: negative abdominal pain, nondistended, positive soft, bowel sounds, no rebound, no ascites, no appreciable mass Extremities: positive 1+ pedal edema bilateral lower extremity, No significant cyanosis, clubbing Skin: Negative rashes, lesions, ulcers Psychiatric:  Negative depression, negative anxiety, negative fatigue, negative mania  Central nervous system:  Cranial nerves II through XII intact, tongue/uvula midline, all extremities muscle strength 5/5, sensation intact throughout,  negative dysarthria, negative expressive aphasia, negative receptive aphasia.   Data Reviewed: Care during the described time interval was provided by me .  I have reviewed this patient's available data, including medical history, events of note, physical examination, and all test results as part of my evaluation. I have personally reviewed and interpreted all radiology studies.  CBC:  Recent Labs Lab 02/08/17 0344  02/09/17 0327 02/10/17 0047 02/11/17 0308 02/12/17 0426 02/13/17 0438  WBC 9.6  < > 11.7* 12.0* 15.7* 9.5 9.3  NEUTROABS 9.2*  --   --  11.1*  --   --   --   HGB 12.7*  < > 11.1* 11.4* 11.7* 11.7* 12.0*  HCT 36.9*  < > 32.7* 33.0* 34.5* 34.8* 35.2*  MCV 94.9  < > 97.3 95.7 95.0 94.6 93.6  PLT 200  < > 155 156 198 218 277  < > = values in this interval not displayed. Basic Metabolic Panel:  Recent Labs Lab 02/08/17 1656 02/09/17 0327 02/10/17 0047 02/11/17 0308 02/12/17 0426 02/13/17 0438  NA  --  135 134* 137 137 134*  K  --  3.2* 3.6 3.9 3.5 3.3*  CL  --  110 109  110 111 107  CO2  --  21* 17* 20* 20* 19*  GLUCOSE  --  113* 166* 148* 112* 113*  BUN  --  13 14 18 15 9   CREATININE  --  0.81 0.78 0.76 0.70 0.64  CALCIUM  --  7.6* 7.5* 7.9* 7.5* 7.4*  MG 1.9  --  2.0  --   --   --   PHOS 2.6  --   --   --   --   --    GFR: Estimated Creatinine Clearance: 83 mL/min (by C-G formula based on SCr of 0.64 mg/dL). Liver Function Tests:  Recent Labs Lab 02/08/17 0516 02/09/17 0327 02/10/17 0047 02/11/17 0308  AST 28 26 54* 24  ALT 14* 22 43 35  ALKPHOS 51 51 64 63  BILITOT 1.3* 0.8 0.3 0.5  PROT 5.6* 5.2* 5.6* 5.7*  ALBUMIN 2.7* 2.4* 2.4* 2.5*    Recent Labs Lab 02/08/17 0515  LIPASE 18   No results for input(s): AMMONIA in the last 168 hours. Coagulation Profile:  Recent Labs Lab 02/10/17 0955 02/11/17 0308 02/12/17 0426 02/13/17 0438  INR 1.08 1.15 1.38 1.72   Cardiac Enzymes:  Recent Labs Lab 02/08/17 1656 02/09/17 1014 02/09/17 1812 02/10/17 0047  TROPONINI 1.07* 0.93* 0.69* 0.55*   BNP (last 3 results) No results for input(s): PROBNP in the last 8760 hours. HbA1C: No results for input(s): HGBA1C in the last 72 hours. CBG: No results for input(s): GLUCAP in the last 168 hours. Lipid Profile: No results for input(s): CHOL, HDL, LDLCALC, TRIG, CHOLHDL, LDLDIRECT in the last 72 hours. Thyroid Function Tests: No results for input(s): TSH, T4TOTAL, FREET4, T3FREE, THYROIDAB in the last 72 hours. Anemia Panel: No results for input(s): VITAMINB12, FOLATE, FERRITIN, TIBC, IRON, RETICCTPCT in the last 72 hours. Urine analysis:    Component Value Date/Time   COLORURINE AMBER (A) 02/08/2017 0523   APPEARANCEUR CLEAR 02/08/2017 0523   LABSPEC 1.028 02/08/2017 0523   PHURINE 5.0 02/08/2017 0523   GLUCOSEU NEGATIVE 02/08/2017 0523   HGBUR SMALL (A) 02/08/2017 0523   BILIRUBINUR NEGATIVE 02/08/2017 0523   KETONESUR NEGATIVE 02/08/2017 0523   PROTEINUR 30 (A) 02/08/2017 0523   NITRITE NEGATIVE 02/08/2017 0523    LEUKOCYTESUR NEGATIVE 02/08/2017 0523   Sepsis Labs: @LABRCNTIP (procalcitonin:4,lacticidven:4)  ) Recent Results (from the past 240 hour(s))  Blood Culture (routine x 2)     Status: Abnormal (Preliminary result)  Collection Time: 02/08/17  4:10 AM  Result Value Ref Range Status   Specimen Description BLOOD RIGHT HAND  Final   Special Requests   Final    BOTTLES DRAWN AEROBIC AND ANAEROBIC Blood Culture adequate volume   Culture  Setup Time   Final    GRAM NEGATIVE RODS ANAEROBIC BOTTLE ONLY CRITICAL RESULT CALLED TO, READ BACK BY AND VERIFIED WITH: A JONES,PHARMD AT 3875 02/11/17 BY L BENFIELD    Culture ESCHERICHIA COLI SUSCEPTIBILITIES TO FOLLOW  (A)  Final   Report Status PENDING  Incomplete  Blood Culture ID Panel (Reflexed)     Status: Abnormal   Collection Time: 02/08/17  4:10 AM  Result Value Ref Range Status   Enterococcus species NOT DETECTED NOT DETECTED Final   Listeria monocytogenes NOT DETECTED NOT DETECTED Final   Staphylococcus species NOT DETECTED NOT DETECTED Final   Staphylococcus aureus NOT DETECTED NOT DETECTED Final   Streptococcus species NOT DETECTED NOT DETECTED Final   Streptococcus agalactiae NOT DETECTED NOT DETECTED Final   Streptococcus pneumoniae NOT DETECTED NOT DETECTED Final   Streptococcus pyogenes NOT DETECTED NOT DETECTED Final   Acinetobacter baumannii NOT DETECTED NOT DETECTED Final   Enterobacteriaceae species DETECTED (A) NOT DETECTED Final    Comment: Enterobacteriaceae represent a large family of gram-negative bacteria, not a single organism. CRITICAL RESULT CALLED TO, READ BACK BY AND VERIFIED WITH: A JONES,PHARMD AT 1301 02/11/17 BY L BENFIELD    Enterobacter cloacae complex NOT DETECTED NOT DETECTED Final   Escherichia coli DETECTED (A) NOT DETECTED Final    Comment: CRITICAL RESULT CALLED TO, READ BACK BY AND VERIFIED WITH: A JONES,PHARMD AT 1301 02/11/17 BY L BENFIELD    Klebsiella oxytoca NOT DETECTED NOT DETECTED Final    Klebsiella pneumoniae NOT DETECTED NOT DETECTED Final   Proteus species NOT DETECTED NOT DETECTED Final   Serratia marcescens NOT DETECTED NOT DETECTED Final   Carbapenem resistance NOT DETECTED NOT DETECTED Final   Haemophilus influenzae NOT DETECTED NOT DETECTED Final   Neisseria meningitidis NOT DETECTED NOT DETECTED Final   Pseudomonas aeruginosa NOT DETECTED NOT DETECTED Final   Candida albicans NOT DETECTED NOT DETECTED Final   Candida glabrata NOT DETECTED NOT DETECTED Final   Candida krusei NOT DETECTED NOT DETECTED Final   Candida parapsilosis NOT DETECTED NOT DETECTED Final   Candida tropicalis NOT DETECTED NOT DETECTED Final  Blood Culture (routine x 2)     Status: None (Preliminary result)   Collection Time: 02/08/17  4:21 AM  Result Value Ref Range Status   Specimen Description BLOOD LEFT HAND  Final   Special Requests   Final    BOTTLES DRAWN AEROBIC AND ANAEROBIC Blood Culture adequate volume   Culture NO GROWTH 4 DAYS  Final   Report Status PENDING  Incomplete  Urine culture     Status: None   Collection Time: 02/08/17  5:23 AM  Result Value Ref Range Status   Specimen Description URINE, CATHETERIZED  Final   Special Requests NONE  Final   Culture NO GROWTH  Final   Report Status 02/09/2017 FINAL  Final  Surgical pcr screen     Status: None   Collection Time: 02/08/17  2:09 PM  Result Value Ref Range Status   MRSA, PCR NEGATIVE NEGATIVE Final   Staphylococcus aureus NEGATIVE NEGATIVE Final    Comment:        The Xpert SA Assay (FDA approved for NASAL specimens in patients over 31 years of age), is one component  of a comprehensive surveillance program.  Test performance has been validated by [facility] for patients greater than or equal to 17 year old. It is not intended to diagnose infection nor to guide or monitor treatment.          Radiology Studies: No results found.      Scheduled Meds: . amoxicillin-clavulanate  1 tablet Oral Q12H  .  aspirin EC  81 mg Oral Daily  . budesonide (PULMICORT) nebulizer solution  0.25 mg Nebulization BID  . diltiazem  240 mg Oral Daily  . metoprolol tartrate  25 mg Oral Q6H  . sodium chloride flush  3 mL Intravenous Q12H  . warfarin   Does not apply Once  . Warfarin - Pharmacist Dosing Inpatient   Does not apply q1800   Continuous Infusions: . heparin 1,850 Units/hr (02/13/17 0000)     LOS: 5 days    Time spent: 0 minutes    Camrie Stock, Geraldo Docker, MD [facility] Pager [phone]   If 7PM-7AM, please contact night-coverage [email or web address] Password [email or web address] 02/13/2017, 7:50 AM

## 2017-02-13 NOTE — Progress Notes (Signed)
Patient ID: Daniel Yu, male   DOB: 10/13/1931, 83 y.o.   MRN: [id number]  [facility] Progress Note  4 Days Post-Op  Subjective: CC- Severe acute suppurative cholecystitis Patient states that he is ready to go home. Denies abdominal pain, nausea, vomiting. States that he does not have much of an appetite, but denies emesis. He is passing flatus and having bowel movements.   Objective: Vital signs in last 24 hours: Temp:  [97.9 F (36.6 C)-98.2 F (36.8 C)] 98.2 F (36.8 C) (06/26 0412) Pulse Rate:  [58-126] 126 (06/26 0423) Resp:  [0-26] 18 (06/26 0525) BP: (126-154)/(82-106) 139/95 (06/26 0525) SpO2:  [96 %-99 %] 97 % (06/26 0412) Weight:  [212 lb 12.8 oz (96.5 kg)] 212 lb 12.8 oz (96.5 kg) (06/26 0412) Last BM Date: 02/13/17  Intake/Output from previous day: 06/25 0701 - 06/26 0700 In: 1288.5 [P.O.:840; I.V.:428.5] Out: 189 [Urine:2; Drains:185; Stool:2] Intake/Output this shift: No intake/output data recorded.  PE: Gen:  Alert, NAD, pleasant HEENT: EOM's intact, pupils equal  Card:  tachycardia, irregular rhythm, no M/G/R heard Pulm:  CTAB, no W/R/R, effort normal Abd: Soft, mild distension, +BS (some hyperactive), incisions C/D/I with steris in place, drain with minimal serosanguinous drainage >> drain removed and dry dressing applied Psych: A&Ox3   Lab Results:   Recent Labs  02/12/17 0426 02/13/17 0438  WBC 9.5 9.3  HGB 11.7* 12.0*  HCT 34.8* 35.2*  PLT 218 277   BMET  Recent Labs  02/12/17 0426 02/13/17 0438  NA 137 134*  K 3.5 3.3*  CL 111 107  CO2 20* 19*  GLUCOSE 112* 113*  BUN 15 9  CREATININE 0.70 0.64  CALCIUM 7.5* 7.4*   PT/INR  Recent Labs  02/12/17 0426 02/13/17 0438  LABPROT 17.0* 20.4*  INR 1.38 1.72   CMP     Component Value Date/Time   NA 134 (L) 02/13/2017 0438   K 3.3 (L) 02/13/2017 0438   CL 107 02/13/2017 0438   CO2 19 (L) 02/13/2017 0438   GLUCOSE 113 (H) 02/13/2017 0438   BUN 9 02/13/2017 0438   CREATININE 0.64 02/13/2017 0438   CALCIUM 7.4 (L) 02/13/2017 0438   PROT 5.7 (L) 02/11/2017 0308   ALBUMIN 2.5 (L) 02/11/2017 0308   AST 24 02/11/2017 0308   ALT 35 02/11/2017 0308   ALKPHOS 63 02/11/2017 0308   BILITOT 0.5 02/11/2017 0308   GFRNONAA >60 02/13/2017 0438   GFRAA >60 02/13/2017 0438   Lipase     Component Value Date/Time   LIPASE 18 02/08/2017 0515       Studies/Results: No results found.  Anti-infectives: Anti-infectives    Start     Dose/Rate Route Frequency Ordered Stop   02/12/17 1400  metroNIDAZOLE (FLAGYL) tablet 500 mg  Status:  Discontinued     500 mg Oral Every 8 hours 02/12/17 0838 02/12/17 1004   02/12/17 1030  amoxicillin-clavulanate (AUGMENTIN) 875-125 MG per tablet 1 tablet     1 tablet Oral Every 12 hours 02/12/17 1004 02/20/17 2359   02/12/17 0845  metroNIDAZOLE (FLAGYL) tablet 500 mg  Status:  Discontinued     500 mg Oral Every 8 hours 02/12/17 0834 02/12/17 0838   02/11/17 1500  cefTRIAXone (ROCEPHIN) 2 g in dextrose 5 % 50 mL IVPB  Status:  Discontinued    Comments:  Pharmacy may adjust dose as appropriate   2 g 100 mL/hr over 30 Minutes Intravenous Every 24 hours 02/11/17 1325 02/12/17 1004   02/08/17  1800  vancomycin (VANCOCIN) IVPB 750 mg/150 ml premix  Status:  Discontinued     750 mg 150 mL/hr over 60 Minutes Intravenous Every 12 hours 02/08/17 1145 02/10/17 1615   02/08/17 1300  piperacillin-tazobactam (ZOSYN) IVPB 3.375 g  Status:  Discontinued     3.375 g 12.5 mL/hr over 240 Minutes Intravenous Every 8 hours 02/08/17 1145 02/11/17 1325   02/08/17 0900  metroNIDAZOLE (FLAGYL) IVPB 500 mg  Status:  Discontinued     500 mg 100 mL/hr over 60 Minutes Intravenous Every 8 hours 02/08/17 0737 02/12/17 0834   02/08/17 0445  vancomycin (VANCOCIN) IVPB 1000 mg/200 mL premix     1,000 mg 200 mL/hr over 60 Minutes Intravenous  Once 02/08/17 0430 02/08/17 1950   02/08/17 0445  piperacillin-tazobactam (ZOSYN) IVPB 3.375 g     3.375 g 100  mL/hr over 30 Minutes Intravenous  Once 02/08/17 0430 02/08/17 0525       Assessment/Plan New onset Afib with RVR - transitioning to coumadin, INR 1.72 today Elevated troponin Asthma HTN  Severe acute suppurative cholecystitis s/p Procedure(s): LAPAROSCOPIC CHOLECYSTECTOMY WITH INTRAOPERATIVE CHOLANGIOGRAM (N/A)6/22 Dr Georgette Dover - POD 4 - drain with 185cc serosanguinous output/24hr  ID - cipro 6/26>>; augmentin 6/25>>6/26; flagyl 6/21>>6/25; rocephin 6/24>>6/25; vanco 6/21>>6/23; zosyn 6/21>>6/24 FEN - heart healthy diet VTE - SCDs, heparin/coumadin  Plan - Patient ready for discharge from surgical standpoint. JP drain removed, change dry dressing as needed for saturation. Ok to shower. Patient will need 1 week of PO antibiotics (Bcx not sensitive to augmentin therefore I switched it to cipro). He will follow-up in our clinic in 2 weeks.   LOS: 5 days    Jerrye Beavers , [facility] 02/13/2017, 7:57 AM Pager: [phone] Consults: [phone] Mon-Fri 7:00 am-4:30 pm Sat-Sun 7:00 am-11:30 am

## 2017-02-14 ENCOUNTER — Encounter (HOSPITAL_COMMUNITY): Payer: Self-pay | Admitting: *Deleted

## 2017-02-14 ENCOUNTER — Inpatient Hospital Stay (HOSPITAL_COMMUNITY): Payer: Medicare Other | Admitting: Anesthesiology

## 2017-02-14 ENCOUNTER — Encounter (HOSPITAL_COMMUNITY)
Admission: EM | Disposition: A | Discharge status: Home / Self Care | Payer: Self-pay | Source: Home / Self Care | Attending: Internal Medicine

## 2017-02-14 ENCOUNTER — Inpatient Hospital Stay (HOSPITAL_COMMUNITY): Payer: Medicare Other

## 2017-02-14 DIAGNOSIS — I340 Nonrheumatic mitral (valve) insufficiency: Secondary | ICD-10-CM

## 2017-02-14 DIAGNOSIS — I10 Essential (primary) hypertension: Secondary | ICD-10-CM

## 2017-02-14 DIAGNOSIS — I4891 Unspecified atrial fibrillation: Secondary | ICD-10-CM

## 2017-02-14 HISTORY — PX: TEE WITHOUT CARDIOVERSION: SHX5443

## 2017-02-14 HISTORY — PX: CARDIOVERSION: SHX1299

## 2017-02-14 LAB — PROTIME-INR
INR: 2.29
Prothrombin Time: 25.6 seconds — ABNORMAL HIGH (ref 11.4–15.2)

## 2017-02-14 LAB — BASIC METABOLIC PANEL
Anion gap: 7 (ref 5–15)
BUN: 7 mg/dL (ref 6–20)
CALCIUM: 7.5 mg/dL — AB (ref 8.9–10.3)
CO2: 21 mmol/L — ABNORMAL LOW (ref 22–32)
CREATININE: 0.65 mg/dL (ref 0.61–1.24)
Chloride: 104 mmol/L (ref 101–111)
GFR calc Af Amer: >60 mL/min (ref >60)
GFR calc non Af Amer: >60 mL/min (ref >60)
GLUCOSE: 115 mg/dL — AB (ref 65–99)
Potassium: 3.3 mmol/L — ABNORMAL LOW (ref 3.5–5.1)
Sodium: 132 mmol/L — ABNORMAL LOW (ref 135–145)

## 2017-02-14 LAB — CBC
HCT: 36.9 % — ABNORMAL LOW (ref 39.0–52.0)
Hemoglobin: 12.3 g/dL — ABNORMAL LOW (ref 13.0–17.0)
MCH: 31.6 pg (ref 26.0–34.0)
MCHC: 33.3 g/dL (ref 30.0–36.0)
MCV: 94.9 fL (ref 78.0–100.0)
PLATELETS: 316 10*3/uL (ref 150–400)
RBC: 3.89 MIL/uL — ABNORMAL LOW (ref 4.22–5.81)
RDW: 13.2 % (ref 11.5–15.5)
WBC: 11.6 10*3/uL — AB (ref 4.0–10.5)

## 2017-02-14 LAB — HEPARIN LEVEL (UNFRACTIONATED): HEPARIN UNFRACTIONATED: 0.42 [IU]/mL (ref 0.30–0.70)

## 2017-02-14 SURGERY — ECHOCARDIOGRAM, TRANSESOPHAGEAL
Anesthesia: General

## 2017-02-14 MED ORDER — LACTATED RINGERS IV SOLN
INTRAVENOUS | Status: DC
Start: 2017-02-14 — End: 2017-02-14
  Administered 2017-02-14: 12:00:00 via INTRAVENOUS

## 2017-02-14 MED ORDER — PROPOFOL 500 MG/50ML IV EMUL
INTRAVENOUS | Status: DC | PRN
  Administered 2017-02-14: 120 ug/kg/min via INTRAVENOUS

## 2017-02-14 MED ORDER — POTASSIUM CHLORIDE CRYS ER 20 MEQ PO TBCR
40.0000 meq | EXTENDED_RELEASE_TABLET | Freq: Three times a day (TID) | ORAL | Status: AC
  Administered 2017-02-14 – 2017-02-15 (×3): 40 meq via ORAL
  Filled 2017-02-14: qty 2
  Filled 2017-02-14: qty 4
  Filled 2017-02-14: qty 2

## 2017-02-14 MED ORDER — WARFARIN SODIUM 2.5 MG PO TABS
2.5000 mg | ORAL_TABLET | Freq: Once | ORAL | Status: AC
  Administered 2017-02-14: 2.5 mg via ORAL
  Filled 2017-02-14: qty 1

## 2017-02-14 MED ORDER — POTASSIUM CHLORIDE CRYS ER 20 MEQ PO TBCR
40.0000 meq | EXTENDED_RELEASE_TABLET | Freq: Once | ORAL | Status: AC
  Administered 2017-02-14: 40 meq via ORAL
  Filled 2017-02-14: qty 2

## 2017-02-14 MED ORDER — BUTAMBEN-TETRACAINE-BENZOCAINE 2-2-14 % EX AERO
INHALATION_SPRAY | CUTANEOUS | Status: DC | PRN
  Administered 2017-02-14: 2 via TOPICAL

## 2017-02-14 MED ORDER — METOPROLOL TARTRATE 25 MG PO TABS
25.0000 mg | ORAL_TABLET | Freq: Four times a day (QID) | ORAL | Status: DC
  Administered 2017-02-14 – 2017-02-15 (×3): 25 mg via ORAL
  Filled 2017-02-14 (×3): qty 1

## 2017-02-14 NOTE — Progress Notes (Signed)
[place] for Heparin / Warfarin Indication: atrial fibrillation  No Known Allergies  Patient Measurements: Height: 5\' 10"  (177.8 cm) Weight: 209 lb 1.6 oz (94.8 kg) IBW/kg (Calculated) : 73 Heparin Dosing Weight: 84kg  Vital Signs: Temp: 98 F (36.7 C) (06/27 0640) Temp Source: Oral (06/27 0640) BP: 125/87 (06/27 0640) Pulse Rate: 80 (06/27 0640)  Labs:  Recent Labs  02/12/17 0426 02/13/17 0438 02/14/17 0537  HGB 11.7* 12.0* 12.3*  HCT 34.8* 35.2* 36.9*  PLT 218 277 316  LABPROT 17.0* 20.4* 25.6*  INR 1.38 1.72 2.29  HEPARINUNFRC 0.32 0.38 0.42  CREATININE 0.70 0.64 0.65    Estimated Creatinine Clearance: 82.3 mL/min (by C-G formula based on SCr of 0.65 mg/dL).  Assessment: 83 year old male continues on heparin and warfarin for Afib INR trending up = 2.29 today Cipro may cause increase in INR Heparin level therapeutic  Goal of Therapy:  INR 2-3 Heparin level 0.3-0.7 units/ml Monitor platelets by anticoagulation protocol: Yes   Plan:  Continue heparin at 1850 units / hr (DCCV today) Warfarin 2.5 mg po x 1 today Daily labs  Thank you Anette Guarneri, PharmD [phone]  02/14/2017 8:56 AM

## 2017-02-14 NOTE — Progress Notes (Signed)
Daniel Yu  Daniel Yu  XTG:[id number] DOB: 1931/12/02 DOA: 02/08/2017 PCP: Lavone Orn, MD    Brief Narrative:  83 y.o.M Hx HTN, Asthma, and SBO who presented to the ED w/ nausea andabdom pain. In the ED CT revealed inflamed bowel and gallbladder. Right upper quadrant ultrasound showed acute cholecystitis.  Subjective: The patient is seen in his room post TEE cardioversion.  He is awake alert and conversant.  He denies chest pain fevers chills nausea vomiting or abdominal pain.  He is tolerating his diet without any difficulty.  General surgery has removed his surgical drain.  He presently is maintaining normal sinus rhythm with a rate of 80 bpm.  Assessment & Plan:  Acute severe suppurative cholecystitis - E coli bacteremia Status post laparoscopic cholecystectomy 6/22 - postop care per General Surgery - doing well and tolerating diet - Gen Surg removed drain 6/26 - will need to complete a total of 14 days of antibiotic therapy given bacteremia  Newly diagnosed atrial fibrillation with RVR Cardiology following - heparin being transitioned to Coumadin - to f/u as outpt at [place] - s/p TEE DCCV today and presently maintaining NSR  Elevated troponin Cardiology following - to have Lexi scan Myoview as outpt - trop peaked at 1.07 - asymtomatic   Hypokalemia Still not at goal - increase supplementation and follow   Asthma Quiescent  HTN Blood pressure controlled  DVT prophylaxis: lovenox  Code Status: FULL CODE Family Communication: Spoke with patient and wife at bedside Disposition Plan: Anticipate discharge home 6/28 if heart rate remains controlled overnight - anticoagulation/follow-up per Cardiology  Consultants:  General surgery [facility] Cardiology  Procedures: 6/22 TTE - EF 45-50% - diffuse hypokinesis   Antimicrobials:  Flagyl 6/21 > 6/25 Rocephin 6/24 Zosyn 6/20 > 6/24 Vancomycin 6/20 > 6/22 Cipro 6/26 >  Objective: Blood  pressure 132/75, pulse 65, temperature 98.1 F (36.7 C), temperature source Oral, resp. rate (!) 22, height 5\' 10"  (1.778 m), weight 94.8 kg (209 lb 1.6 oz), SpO2 96 %.  Intake/Output Summary (Last 24 hours) at 02/14/17 1621 Last data filed at 02/14/17 1336  Gross per 24 hour  Intake              100 ml  Output                0 ml  Net              100 ml   Filed Weights   02/12/17 0346 02/13/17 0412 02/14/17 0640  Weight: 98.4 kg (216 lb 14.4 oz) 96.5 kg (212 lb 12.8 oz) 94.8 kg (209 lb 1.6 oz)    Examination: General: No acute respiratory distress - conversant  Lungs: Clear to auscultation B  Cardiovascular: RRR  Extremities: No significant edema   CBC:  Recent Labs Lab 02/08/17 0344  02/10/17 0047 02/11/17 0308 02/12/17 0426 02/13/17 0438 02/14/17 0537  WBC 9.6  < > 12.0* 15.7* 9.5 9.3 11.6*  NEUTROABS 9.2*  --  11.1*  --   --   --   --   HGB 12.7*  < > 11.4* 11.7* 11.7* 12.0* 12.3*  HCT 36.9*  < > 33.0* 34.5* 34.8* 35.2* 36.9*  MCV 94.9  < > 95.7 95.0 94.6 93.6 94.9  PLT 200  < > 156 198 218 277 316  < > = values in this interval not displayed. Basic Metabolic Panel:  Recent Labs Lab 02/08/17 1656  02/10/17 0047 02/11/17 0308  02/12/17 0426 02/13/17 0438 02/13/17 1533 02/14/17 0537  NA  --   < > 134* 137 137 134*  --  132*  K  --   < > 3.6 3.9 3.5 3.3* 3.4* 3.3*  CL  --   < > 109 110 111 107  --  104  CO2  --   < > 17* 20* 20* 19*  --  21*  GLUCOSE  --   < > 166* 148* 112* 113*  --  115*  BUN  --   < > 14 18 15 9   --  7  CREATININE  --   < > 0.78 0.76 0.70 0.64  --  0.65  CALCIUM  --   < > 7.5* 7.9* 7.5* 7.4*  --  7.5*  MG 1.9  --  2.0  --   --   --  1.8  --   PHOS 2.6  --   --   --   --   --   --   --   < > = values in this interval not displayed. GFR: Estimated Creatinine Clearance: 82.3 mL/min (by C-G formula based on SCr of 0.65 mg/dL).  Liver Function Tests:  Recent Labs Lab 02/08/17 0516 02/09/17 0327 02/10/17 0047 02/11/17 0308  AST 28  26 54* 24  ALT 14* 22 43 35  ALKPHOS 51 51 64 63  BILITOT 1.3* 0.8 0.3 0.5  PROT 5.6* 5.2* 5.6* 5.7*  ALBUMIN 2.7* 2.4* 2.4* 2.5*    Recent Labs Lab 02/08/17 0515  LIPASE 18   Coagulation Profile:  Recent Labs Lab 02/10/17 0955 02/11/17 0308 02/12/17 0426 02/13/17 0438 02/14/17 0537  INR 1.08 1.15 1.38 1.72 2.29    Cardiac Enzymes:  Recent Labs Lab 02/08/17 1656 02/09/17 1014 02/09/17 1812 02/10/17 0047  TROPONINI 1.07* 0.93* 0.69* 0.55*    HbA1C: Hgb A1c MFr Bld  Date/Time Value Ref Range Status  01/03/2016 06:08 PM 5.4 4.8 - 5.6 % Final    Comment:    (NOTE)         Pre-diabetes: 5.7 - 6.4         Diabetes: >6.4         Glycemic control for adults with diabetes: <7.0     Recent Results (from the past 240 hour(s))  Blood Culture (routine x 2)     Status: Abnormal   Collection Time: 02/08/17  4:10 AM  Result Value Ref Range Status   Specimen Description BLOOD RIGHT HAND  Final   Special Requests   Final    BOTTLES DRAWN AEROBIC AND ANAEROBIC Blood Culture adequate volume   Culture  Setup Time   Final    GRAM NEGATIVE RODS ANAEROBIC BOTTLE ONLY CRITICAL RESULT CALLED TO, READ BACK BY AND VERIFIED WITH: A JONES,PHARMD AT 1301 02/11/17 BY L BENFIELD    Culture ESCHERICHIA COLI (A)  Final   Report Status 02/13/2017 FINAL  Final   Organism ID, Bacteria ESCHERICHIA COLI  Final      Susceptibility   Escherichia coli - MIC*    AMPICILLIN >=32 RESISTANT Resistant     CEFAZOLIN >=64 RESISTANT Resistant     CEFEPIME <=1 SENSITIVE Sensitive     CEFTAZIDIME 2 SENSITIVE Sensitive     CEFTRIAXONE <=1 SENSITIVE Sensitive     CIPROFLOXACIN <=0.25 SENSITIVE Sensitive     GENTAMICIN <=1 SENSITIVE Sensitive     IMIPENEM <=0.25 SENSITIVE Sensitive     TRIMETH/SULFA <=20 SENSITIVE Sensitive     AMPICILLIN/SULBACTAM 16  INTERMEDIATE Intermediate     PIP/TAZO 8 SENSITIVE Sensitive     Extended ESBL NEGATIVE Sensitive     * ESCHERICHIA COLI  Blood Culture ID Panel  (Reflexed)     Status: Abnormal   Collection Time: 02/08/17  4:10 AM  Result Value Ref Range Status   Enterococcus species NOT DETECTED NOT DETECTED Final   Listeria monocytogenes NOT DETECTED NOT DETECTED Final   Staphylococcus species NOT DETECTED NOT DETECTED Final   Staphylococcus aureus NOT DETECTED NOT DETECTED Final   Streptococcus species NOT DETECTED NOT DETECTED Final   Streptococcus agalactiae NOT DETECTED NOT DETECTED Final   Streptococcus pneumoniae NOT DETECTED NOT DETECTED Final   Streptococcus pyogenes NOT DETECTED NOT DETECTED Final   Acinetobacter baumannii NOT DETECTED NOT DETECTED Final   Enterobacteriaceae species DETECTED (A) NOT DETECTED Final    Comment: Enterobacteriaceae represent a large family of gram-negative bacteria, not a single organism. CRITICAL RESULT CALLED TO, READ BACK BY AND VERIFIED WITH: A JONES,PHARMD AT 1301 02/11/17 BY L BENFIELD    Enterobacter cloacae complex NOT DETECTED NOT DETECTED Final   Escherichia coli DETECTED (A) NOT DETECTED Final    Comment: CRITICAL RESULT CALLED TO, READ BACK BY AND VERIFIED WITH: A JONES,PHARMD AT 1301 02/11/17 BY L BENFIELD    Klebsiella oxytoca NOT DETECTED NOT DETECTED Final   Klebsiella pneumoniae NOT DETECTED NOT DETECTED Final   Proteus species NOT DETECTED NOT DETECTED Final   Serratia marcescens NOT DETECTED NOT DETECTED Final   Carbapenem resistance NOT DETECTED NOT DETECTED Final   Haemophilus influenzae NOT DETECTED NOT DETECTED Final   Neisseria meningitidis NOT DETECTED NOT DETECTED Final   Pseudomonas aeruginosa NOT DETECTED NOT DETECTED Final   Candida albicans NOT DETECTED NOT DETECTED Final   Candida glabrata NOT DETECTED NOT DETECTED Final   Candida krusei NOT DETECTED NOT DETECTED Final   Candida parapsilosis NOT DETECTED NOT DETECTED Final   Candida tropicalis NOT DETECTED NOT DETECTED Final  Blood Culture (routine x 2)     Status: None   Collection Time: 02/08/17  4:21 AM  Result Value  Ref Range Status   Specimen Description BLOOD LEFT HAND  Final   Special Requests   Final    BOTTLES DRAWN AEROBIC AND ANAEROBIC Blood Culture adequate volume   Culture NO GROWTH 5 DAYS  Final   Report Status 02/13/2017 FINAL  Final  Urine culture     Status: None   Collection Time: 02/08/17  5:23 AM  Result Value Ref Range Status   Specimen Description URINE, CATHETERIZED  Final   Special Requests NONE  Final   Culture NO GROWTH  Final   Report Status 02/09/2017 FINAL  Final  Surgical pcr screen     Status: None   Collection Time: 02/08/17  2:09 PM  Result Value Ref Range Status   MRSA, PCR NEGATIVE NEGATIVE Final   Staphylococcus aureus NEGATIVE NEGATIVE Final    Comment:        The Xpert SA Assay (FDA approved for NASAL specimens in patients over 29 years of age), is one component of a comprehensive surveillance program.  Test performance has been validated by [facility] for patients greater than or equal to 77 year old. It is not intended to diagnose infection nor to guide or monitor treatment.      Scheduled Meds: . aspirin EC  81 mg Oral Daily  . budesonide (PULMICORT) nebulizer solution  0.25 mg Nebulization BID  . ciprofloxacin  500 mg Oral BID  .  diltiazem  360 mg Oral Daily  . metoprolol tartrate  25 mg Oral Q6H  . sodium chloride flush  3 mL Intravenous Q12H  . sodium chloride flush  3 mL Intravenous Q12H  . warfarin  2.5 mg Oral ONCE-1800  . Warfarin - Pharmacist Dosing Inpatient   Does not apply q1800     LOS: 6 days   Cherene Altes, MD [facility] Office  [phone] Pager - Text Page per Amion as per below:  On-Call/Text Page:      [email or web address]      password [email or web address]  If 7PM-7AM, please contact night-coverage [email or web address] Password [email or web address] 02/14/2017, 4:21 PM

## 2017-02-14 NOTE — Transfer of Care (Signed)
Immediate Anesthesia Transfer of Care Note  Patient: Daniel Yu  Procedure(s) Performed: Procedure(s): TRANSESOPHAGEAL ECHOCARDIOGRAM (TEE) (N/A) CARDIOVERSION (N/A)  Patient Location: Endoscopy Unit  Anesthesia Type:General  Level of Consciousness: awake, alert , oriented and patient cooperative  Airway & Oxygen Therapy: Patient Spontanous Breathing and Patient connected to nasal cannula oxygen  Post-op Assessment: Report given to RN and Post -op Vital signs reviewed and stable  Post vital signs: Reviewed and stable  Last Vitals:  Vitals:   02/14/17 0640 02/14/17 1115  BP: 125/87 134/83  Pulse: 80 87  Resp: 19 (!) 25  Temp: 36.7 C 36.8 C    Last Pain:  Vitals:   02/14/17 1115  TempSrc: Oral  PainSc:       Patients Stated Pain Goal: 0 (21/22/48 2500)  Complications: No apparent anesthesia complications

## 2017-02-14 NOTE — H&P (View-Only) (Signed)
Progress Note  Patient Name: Daniel Yu Date of Encounter: 02/14/2017  Primary Cardiologist: Dr. Stanford Breed   Subjective   Asymptomatic. No palpitations, CP or dyspnea. He is improving from a surgical standpoint. Drain was removed today.   Inpatient Medications    Scheduled Meds: . aspirin EC  81 mg Oral Daily  . budesonide (PULMICORT) nebulizer solution  0.25 mg Nebulization BID  . ciprofloxacin  500 mg Oral BID  . diltiazem  360 mg Oral Daily  . metoprolol tartrate  50 mg Oral Q6H  . potassium chloride  40 mEq Oral Once  . sodium chloride flush  3 mL Intravenous Q12H  . sodium chloride flush  3 mL Intravenous Q12H  . warfarin   Does not apply Once  . Warfarin - Pharmacist Dosing Inpatient   Does not apply q1800   Continuous Infusions: . sodium chloride    . sodium chloride    . heparin 1,850 Units/hr (02/14/17 0713)   PRN Meds: acetaminophen **OR** acetaminophen, HYDROcodone-acetaminophen, hydroxypropyl methylcellulose / hypromellose, levalbuterol, morphine injection, ondansetron **OR** ondansetron (ZOFRAN) IV, sodium chloride flush   Vital Signs    Vitals:   02/13/17 2036 02/13/17 2153 02/14/17 0011 02/14/17 0640  BP:  121/84 130/89 125/87  Pulse: 96 99 92 80  Resp: 19 20 18 19   Temp:  97.7 F (36.5 C) 97.8 F (36.6 C) 98 F (36.7 C)  TempSrc:  Oral Oral Oral  SpO2: 97% 98% 98% 98%  Weight:    209 lb 1.6 oz (94.8 kg)  Height:        Intake/Output Summary (Last 24 hours) at 02/14/17 0752 Last data filed at 02/13/17 1311  Gross per 24 hour  Intake              240 ml  Output                0 ml  Net              240 ml   Filed Weights   02/12/17 0346 02/13/17 0412 02/14/17 0640  Weight: 216 lb 14.4 oz (98.4 kg) 212 lb 12.8 oz (96.5 kg) 209 lb 1.6 oz (94.8 kg)    Telemetry    Atrial fibrillation w/ RVR - Personally Reviewed  ECG    Atrial fibrillation  - Personally Reviewed  Physical Exam   GEN: No acute distress.   Neck: No JVD Cardiac:  irregular irregular, tachy rate, no murmurs, rubs, or gallops.  Respiratory: Clear to auscultation bilaterally. GI: Soft, nontender, non-distended  MS: No edema; No deformity. Neuro:  Nonfocal  Psych: Normal affect   Labs    Chemistry  Recent Labs Lab 02/09/17 0327 02/10/17 7782 02/11/17 0308 02/12/17 0426 02/13/17 0438 02/13/17 1533 02/14/17 0537  NA 135 134* 137 137 134*  --  132*  K 3.2* 3.6 3.9 3.5 3.3* 3.4* 3.3*  CL 110 109 110 111 107  --  104  CO2 21* 17* 20* 20* 19*  --  21*  GLUCOSE 113* 166* 148* 112* 113*  --  115*  BUN 13 14 18 15 9   --  7  CREATININE 0.81 0.78 0.76 0.70 0.64  --  0.65  CALCIUM 7.6* 7.5* 7.9* 7.5* 7.4*  --  7.5*  PROT 5.2* 5.6* 5.7*  --   --   --   --   ALBUMIN 2.4* 2.4* 2.5*  --   --   --   --   AST 26 54* 24  --   --   --   --  ALT 22 43 35  --   --   --   --   ALKPHOS 51 64 63  --   --   --   --   BILITOT 0.8 0.3 0.5  --   --   --   --   GFRNONAA >60 >60 >60 >60 >60  --  >60  GFRAA >60 >60 >60 >60 >60  --  >60  ANIONGAP 4* 8 7 6 8   --  7     Hematology  Recent Labs Lab 02/12/17 0426 02/13/17 0438 02/14/17 0537  WBC 9.5 9.3 11.6*  RBC 3.68* 3.76* 3.89*  HGB 11.7* 12.0* 12.3*  HCT 34.8* 35.2* 36.9*  MCV 94.6 93.6 94.9  MCH 31.8 31.9 31.6  MCHC 33.6 34.1 33.3  RDW 13.3 13.2 13.2  PLT 218 277 316    Cardiac Enzymes  Recent Labs Lab 02/08/17 1656 02/09/17 1014 02/09/17 1812 02/10/17 0047  TROPONINI 1.07* 0.93* 0.69* 0.55*     Recent Labs Lab 02/08/17 0424  TROPIPOC 0.03     BNPNo results for input(s): BNP, PROBNP in the last 168 hours.   DDimer No results for input(s): DDIMER in the last 168 hours.   Radiology    No results found.  Cardiac Studies   2D Echo 02/09/17 Study Conclusions  - Left ventricle: The cavity size was normal. There was mild concentric hypertrophy. Systolic function was mildly reduced. The estimated ejection fraction was in the range of 45% to 50%. Diffuse hypokinesis. -  Aortic valve: Transvalvular velocity was within the normal range. There was no stenosis. There was no regurgitation. - Mitral valve: Transvalvular velocity was within the normal range. There was no evidence for stenosis. There was mild regurgitation. - Left atrium: The atrium was mildly dilated. - Right ventricle: The cavity size was normal. Wall thickness was normal. Systolic function was normal. - Tricuspid valve: There was mild regurgitation. - Pulmonary arteries: Systolic pressure was within the normal range. PA peak pressure: 32 mm Hg (S).  Impressions:  - Systolic function is mildly reduce. This may be due to tachycardia and atrial fibrillation. Consider repeating a limitied echo at lowe heart rates to better assess systolic function.  Patient Profile     [name] has a PMH significant for asthma, HTN, and SBO (12/2015). [facility] with complaints of nausea and constipation. Code sepsis was called and he was found to be in Afib RVR. Also discovered to have acute cholecystitis>> Status post laparoscopic cholecystectomy 6/22. Troponin was elevated at 1.07, however completely asymptomatic w/o EKG changes. ? Demand ischemia. Plan is for outpatient NST.   Assessment & Plan    1. Newly Diagnosed Atrial Fibrillation w/ RVR: in the setting of acute cholecystitis. Now s/p lap chole. 2D echo showed EF of 45-50%. LA mildly dilated. TSH, Mg WNL. He was hypokalemic this am again, however he has received supplementation. His resting rate is now in the 70's, BP controlled on increased Diltiazem and Metoprolol. Continue Coumadin for a/c. He is being bridged with heparin, INR > 2 today.   2. Elevated Troponin: level peaked at 1.07. Level trending downward. No significant EKG changes. He has remained completely asymptomatic. EF 45-50% by echo. ? Demand ischemia from rapid afib and acute cholecystitis. Plan is for outpatient NST. We will arrange.   3. Acute Cholecystitis:  s/p lap chole on 6/22. Surgical drain removed earlier today. Post operative management per general surgery.   4. HTN: Controlled   5. Hypokalemia: K  3.3. K-dur 40 mEq ordered.   Plan: Pt is for TEE CV this am. His rate is better, some concern we may overshoot and make him bradycardiac after cardioversion, will back off a little on beta blocker. 40Meq KCL ordered for this am.   SignedKerin Ransom, PA-C  02/14/2017, 7:52 AM     History and all data above reviewed.  Patient examined.  I agree with the findings as above.  No pain.  No SOB. The patient exam reveals JHH:[id number]  ,  Lungs: Clear  ,  Abd: Positive bowel sounds, no rebound no guarding, Ext Mild edema  .  All available labs, radiology testing, previous records reviewed. Agree with documented assessment and plan.  Now he has some bradyarrhythmia.  I will proceed with TEE/DCCV for rhythm control as rate control has been difficult.   Jeneen Rinks Ahriana Gunkel  9:06 AM  02/14/2017

## 2017-02-14 NOTE — Progress Notes (Signed)
  Echocardiogram Echocardiogram Transesophageal has been performed.  Philipp Deputy 02/14/2017, 2:10 PM

## 2017-02-14 NOTE — CV Procedure (Signed)
TEE/DCC  EF 40% diffuse hypokinesis Smoke in LAA no thrombus Mild MR Mild AR No PFO Normal RV  DCC x 1 129 J converted from afib rate 90 to NSR rate 74 On heparin and INR 2.2 this am  No immediate neurologic sequelae  Jenkins Rouge, MD

## 2017-02-14 NOTE — Progress Notes (Signed)
Progress Note  Patient Name: Daniel Yu Date of Encounter: 02/14/2017  Primary Cardiologist: Dr. Stanford Breed   Subjective   Asymptomatic. No palpitations, CP or dyspnea. He is improving from a surgical standpoint. Drain was removed today.   Inpatient Medications    Scheduled Meds: . aspirin EC  81 mg Oral Daily  . budesonide (PULMICORT) nebulizer solution  0.25 mg Nebulization BID  . ciprofloxacin  500 mg Oral BID  . diltiazem  360 mg Oral Daily  . metoprolol tartrate  50 mg Oral Q6H  . potassium chloride  40 mEq Oral Once  . sodium chloride flush  3 mL Intravenous Q12H  . sodium chloride flush  3 mL Intravenous Q12H  . warfarin   Does not apply Once  . Warfarin - Pharmacist Dosing Inpatient   Does not apply q1800   Continuous Infusions: . sodium chloride    . sodium chloride    . heparin 1,850 Units/hr (02/14/17 0713)   PRN Meds: acetaminophen **OR** acetaminophen, HYDROcodone-acetaminophen, hydroxypropyl methylcellulose / hypromellose, levalbuterol, morphine injection, ondansetron **OR** ondansetron (ZOFRAN) IV, sodium chloride flush   Vital Signs    Vitals:   02/13/17 2036 02/13/17 2153 02/14/17 0011 02/14/17 0640  BP:  121/84 130/89 125/87  Pulse: 96 99 92 80  Resp: 19 20 18 19   Temp:  97.7 F (36.5 C) 97.8 F (36.6 C) 98 F (36.7 C)  TempSrc:  Oral Oral Oral  SpO2: 97% 98% 98% 98%  Weight:    209 lb 1.6 oz (94.8 kg)  Height:        Intake/Output Summary (Last 24 hours) at 02/14/17 0752 Last data filed at 02/13/17 1311  Gross per 24 hour  Intake              240 ml  Output                0 ml  Net              240 ml   Filed Weights   02/12/17 0346 02/13/17 0412 02/14/17 0640  Weight: 216 lb 14.4 oz (98.4 kg) 212 lb 12.8 oz (96.5 kg) 209 lb 1.6 oz (94.8 kg)    Telemetry    Atrial fibrillation w/ RVR - Personally Reviewed  ECG    Atrial fibrillation  - Personally Reviewed  Physical Exam   GEN: No acute distress.   Neck: No JVD Cardiac:  irregular irregular, tachy rate, no murmurs, rubs, or gallops.  Respiratory: Clear to auscultation bilaterally. GI: Soft, nontender, non-distended  MS: No edema; No deformity. Neuro:  Nonfocal  Psych: Normal affect   Labs    Chemistry  Recent Labs Lab 02/09/17 0327 02/10/17 4098 02/11/17 0308 02/12/17 0426 02/13/17 0438 02/13/17 1533 02/14/17 0537  NA 135 134* 137 137 134*  --  132*  K 3.2* 3.6 3.9 3.5 3.3* 3.4* 3.3*  CL 110 109 110 111 107  --  104  CO2 21* 17* 20* 20* 19*  --  21*  GLUCOSE 113* 166* 148* 112* 113*  --  115*  BUN 13 14 18 15 9   --  7  CREATININE 0.81 0.78 0.76 0.70 0.64  --  0.65  CALCIUM 7.6* 7.5* 7.9* 7.5* 7.4*  --  7.5*  PROT 5.2* 5.6* 5.7*  --   --   --   --   ALBUMIN 2.4* 2.4* 2.5*  --   --   --   --   AST 26 54* 24  --   --   --   --  ALT 22 43 35  --   --   --   --   ALKPHOS 51 64 63  --   --   --   --   BILITOT 0.8 0.3 0.5  --   --   --   --   GFRNONAA >60 >60 >60 >60 >60  --  >60  GFRAA >60 >60 >60 >60 >60  --  >60  ANIONGAP 4* 8 7 6 8   --  7     Hematology  Recent Labs Lab 02/12/17 0426 02/13/17 0438 02/14/17 0537  WBC 9.5 9.3 11.6*  RBC 3.68* 3.76* 3.89*  HGB 11.7* 12.0* 12.3*  HCT 34.8* 35.2* 36.9*  MCV 94.6 93.6 94.9  MCH 31.8 31.9 31.6  MCHC 33.6 34.1 33.3  RDW 13.3 13.2 13.2  PLT 218 277 316    Cardiac Enzymes  Recent Labs Lab 02/08/17 1656 02/09/17 1014 02/09/17 1812 02/10/17 0047  TROPONINI 1.07* 0.93* 0.69* 0.55*     Recent Labs Lab 02/08/17 0424  TROPIPOC 0.03     BNPNo results for input(s): BNP, PROBNP in the last 168 hours.   DDimer No results for input(s): DDIMER in the last 168 hours.   Radiology    No results found.  Cardiac Studies   2D Echo 02/09/17 Study Conclusions  - Left ventricle: The cavity size was normal. There was mild concentric hypertrophy. Systolic function was mildly reduced. The estimated ejection fraction was in the range of 45% to 50%. Diffuse hypokinesis. -  Aortic valve: Transvalvular velocity was within the normal range. There was no stenosis. There was no regurgitation. - Mitral valve: Transvalvular velocity was within the normal range. There was no evidence for stenosis. There was mild regurgitation. - Left atrium: The atrium was mildly dilated. - Right ventricle: The cavity size was normal. Wall thickness was normal. Systolic function was normal. - Tricuspid valve: There was mild regurgitation. - Pulmonary arteries: Systolic pressure was within the normal range. PA peak pressure: 32 mm Hg (S).  Impressions:  - Systolic function is mildly reduce. This may be due to tachycardia and atrial fibrillation. Consider repeating a limitied echo at lowe heart rates to better assess systolic function.  Patient Profile     Daniel Yu has a PMH significant for asthma, HTN, and SBO (12/2015). He presented to [facility] with complaints of nausea and constipation. Code sepsis was called and he was found to be in Afib RVR. Also discovered to have acute cholecystitis>> Status post laparoscopic cholecystectomy 6/22. Troponin was elevated at 1.07, however completely asymptomatic w/o EKG changes. ? Demand ischemia. Plan is for outpatient NST.   Assessment & Plan    1. Newly Diagnosed Atrial Fibrillation w/ RVR: in the setting of acute cholecystitis. Now s/p lap chole. 2D echo showed EF of 45-50%. LA mildly dilated. TSH, Mg WNL. He was hypokalemic this am again, however he has received supplementation. His resting rate is now in the 70's, BP controlled on increased Diltiazem and Metoprolol. Continue Coumadin for a/c. He is being bridged with heparin, INR > 2 today.   2. Elevated Troponin: level peaked at 1.07. Level trending downward. No significant EKG changes. He has remained completely asymptomatic. EF 45-50% by echo. ? Demand ischemia from rapid afib and acute cholecystitis. Plan is for outpatient NST. We will arrange.   3. Acute Cholecystitis:  s/p lap chole on 6/22. Surgical drain removed earlier today. Post operative management per general surgery.   4. HTN: Controlled   5. Hypokalemia: K  3.3. K-dur 40 mEq ordered.   Plan: Pt is for TEE CV this am. His rate is better, some concern we may overshoot and make him bradycardiac after cardioversion, will back off a little on beta blocker. 40Meq KCL ordered for this am.   SignedKerin Ransom, PA-C  02/14/2017, 7:52 AM     History and all data above reviewed.  Patient examined.  I agree with the findings as above.  No pain.  No SOB. The patient exam reveals ITG:[id number]  ,  Lungs: Clear  ,  Abd: Positive bowel sounds, no rebound no guarding, Ext Mild edema  .  All available labs, radiology testing, previous records reviewed. Agree with documented assessment and plan.  Now he has some bradyarrhythmia.  I will proceed with TEE/DCCV for rhythm control as rate control has been difficult.   Jeneen Rinks Elowyn Raupp  9:06 AM  02/14/2017

## 2017-02-14 NOTE — Anesthesia Preprocedure Evaluation (Addendum)
Anesthesia Evaluation  Patient identified by MRN, date of birth, ID band Patient awake    Reviewed: Allergy & Precautions, NPO status , Patient's Chart, lab work & pertinent test results  History of Anesthesia Complications (+) history of anesthetic complications  Airway Mallampati: II  TM Distance: >3 FB Neck ROM: Full    Dental no notable dental hx.    Pulmonary asthma ,    breath sounds clear to auscultation       Cardiovascular hypertension, + dysrhythmias  Rhythm:Irregular Rate:Normal     Neuro/Psych    GI/Hepatic negative GI ROS, Neg liver ROS,   Endo/Other  negative endocrine ROS  Renal/GU negative Renal ROS     Musculoskeletal  (+) Arthritis ,   Abdominal   Peds  Hematology   Anesthesia Other Findings   Reproductive/Obstetrics                            Anesthesia Physical Anesthesia Plan  ASA: III  Anesthesia Plan: General   Post-op Pain Management:    Induction: Intravenous  PONV Risk Score and Plan: 1 and Ondansetron and Dexamethasone  Airway Management Planned: Mask  Additional Equipment:   Intra-op Plan:   Post-operative Plan:   Informed Consent: I have reviewed the patients History and Physical, chart, labs and discussed the procedure including the risks, benefits and alternatives for the proposed anesthesia with the patient or authorized representative who has indicated his/her understanding and acceptance.     Plan Discussed with: CRNA  Anesthesia Plan Comments:         Anesthesia Quick Evaluation

## 2017-02-14 NOTE — Interval H&P Note (Signed)
History and Physical Interval Note:  02/14/2017 12:22 PM  Daniel Yu  has presented today for surgery, with the diagnosis of AFIB  The various methods of treatment have been discussed with the patient and family. After consideration of risks, benefits and other options for treatment, the patient has consented to  Procedure(s): TRANSESOPHAGEAL ECHOCARDIOGRAM (TEE) (N/A) CARDIOVERSION (N/A) as a surgical intervention .  The patient's history has been reviewed, patient examined, no change in status, stable for surgery.  I have reviewed the patient's chart and labs.  Questions were answered to the patient's satisfaction.     Jenkins Rouge

## 2017-02-15 ENCOUNTER — Encounter (HOSPITAL_COMMUNITY): Payer: Self-pay | Admitting: Cardiology

## 2017-02-15 DIAGNOSIS — J4522 Mild intermittent asthma with status asthmaticus: Secondary | ICD-10-CM

## 2017-02-15 DIAGNOSIS — E876 Hypokalemia: Secondary | ICD-10-CM

## 2017-02-15 DIAGNOSIS — R778 Other specified abnormalities of plasma proteins: Secondary | ICD-10-CM | POA: Diagnosis not present

## 2017-02-15 DIAGNOSIS — I4891 Unspecified atrial fibrillation: Secondary | ICD-10-CM | POA: Diagnosis not present

## 2017-02-15 DIAGNOSIS — I480 Paroxysmal atrial fibrillation: Secondary | ICD-10-CM

## 2017-02-15 DIAGNOSIS — R7881 Bacteremia: Secondary | ICD-10-CM

## 2017-02-15 DIAGNOSIS — A4151 Sepsis due to Escherichia coli [E. coli]: Secondary | ICD-10-CM

## 2017-02-15 DIAGNOSIS — K810 Acute cholecystitis: Secondary | ICD-10-CM | POA: Diagnosis present

## 2017-02-15 DIAGNOSIS — I5021 Acute systolic (congestive) heart failure: Secondary | ICD-10-CM

## 2017-02-15 DIAGNOSIS — R7989 Other specified abnormal findings of blood chemistry: Secondary | ICD-10-CM | POA: Diagnosis not present

## 2017-02-15 LAB — CBC
HCT: 35.5 % — ABNORMAL LOW (ref 39.0–52.0)
HEMOGLOBIN: 12.2 g/dL — AB (ref 13.0–17.0)
MCH: 32.3 pg (ref 26.0–34.0)
MCHC: 34.4 g/dL (ref 30.0–36.0)
MCV: 93.9 fL (ref 78.0–100.0)
Platelets: 391 10*3/uL (ref 150–400)
RBC: 3.78 MIL/uL — ABNORMAL LOW (ref 4.22–5.81)
RDW: 13.3 % (ref 11.5–15.5)
WBC: 14 10*3/uL — ABNORMAL HIGH (ref 4.0–10.5)

## 2017-02-15 LAB — BASIC METABOLIC PANEL
Anion gap: 7 (ref 5–15)
BUN: 8 mg/dL (ref 6–20)
CALCIUM: 7.8 mg/dL — AB (ref 8.9–10.3)
CO2: 21 mmol/L — AB (ref 22–32)
Chloride: 105 mmol/L (ref 101–111)
Creatinine, Ser: 0.7 mg/dL (ref 0.61–1.24)
GFR calc Af Amer: >60 mL/min (ref >60)
Glucose, Bld: 110 mg/dL — ABNORMAL HIGH (ref 65–99)
Potassium: 3.6 mmol/L (ref 3.5–5.1)
Sodium: 133 mmol/L — ABNORMAL LOW (ref 135–145)

## 2017-02-15 LAB — MAGNESIUM: Magnesium: 1.9 mg/dL (ref 1.7–2.4)

## 2017-02-15 LAB — HEPARIN LEVEL (UNFRACTIONATED): HEPARIN UNFRACTIONATED: 0.55 [IU]/mL (ref 0.30–0.70)

## 2017-02-15 LAB — PROTIME-INR
INR: 2.52
PROTHROMBIN TIME: 27.6 s — AB (ref 11.4–15.2)

## 2017-02-15 LAB — POTASSIUM: Potassium: 4.2 mmol/L (ref 3.5–5.1)

## 2017-02-15 MED ORDER — HYDROCODONE-ACETAMINOPHEN 5-325 MG PO TABS: 1.0000 | ORAL_TABLET | ORAL | 0 refills | Status: DC | PRN

## 2017-02-15 MED ORDER — POTASSIUM CHLORIDE CRYS ER 20 MEQ PO TBCR
40.0000 meq | EXTENDED_RELEASE_TABLET | Freq: Once | ORAL | Status: AC
  Administered 2017-02-15: 40 meq via ORAL
  Filled 2017-02-15: qty 2

## 2017-02-15 MED ORDER — WARFARIN SODIUM 2.5 MG PO TABS: 2.5000 mg | ORAL_TABLET | ORAL | Status: DC

## 2017-02-15 MED ORDER — POTASSIUM CHLORIDE CRYS ER 20 MEQ PO TBCR: 40.0000 meq | EXTENDED_RELEASE_TABLET | Freq: Once | ORAL | Status: DC

## 2017-02-15 MED ORDER — METOPROLOL TARTRATE 25 MG PO TABS
25.0000 mg | ORAL_TABLET | Freq: Once | ORAL | Status: AC
  Administered 2017-02-15: 25 mg via ORAL
  Filled 2017-02-15: qty 1

## 2017-02-15 MED ORDER — WARFARIN SODIUM 5 MG PO TABS: 5.0000 mg | ORAL_TABLET | ORAL | 0 refills | Status: DC

## 2017-02-15 MED ORDER — DILTIAZEM HCL ER COATED BEADS 360 MG PO CP24: 360.0000 mg | ORAL_CAPSULE | Freq: Every day | ORAL | 0 refills | Status: DC

## 2017-02-15 MED ORDER — METOPROLOL TARTRATE 50 MG PO TABS: 50.0000 mg | ORAL_TABLET | Freq: Two times a day (BID) | ORAL | Status: DC

## 2017-02-15 MED ORDER — WARFARIN SODIUM 2.5 MG PO TABS: 2.5000 mg | ORAL_TABLET | ORAL | 0 refills | Status: DC

## 2017-02-15 MED ORDER — WARFARIN SODIUM 5 MG PO TABS: 5.0000 mg | ORAL_TABLET | ORAL | Status: DC

## 2017-02-15 MED ORDER — CIPROFLOXACIN HCL 500 MG PO TABS: 500.0000 mg | ORAL_TABLET | Freq: Two times a day (BID) | ORAL | 0 refills | Status: DC

## 2017-02-15 MED ORDER — ASPIRIN 81 MG PO TBEC: 81.0000 mg | DELAYED_RELEASE_TABLET | Freq: Every day | ORAL | 0 refills | Status: DC

## 2017-02-15 MED ORDER — METOPROLOL TARTRATE 50 MG PO TABS: 50.0000 mg | ORAL_TABLET | Freq: Two times a day (BID) | ORAL | 0 refills | Status: DC

## 2017-02-15 MED ORDER — WARFARIN SODIUM 1 MG PO TABS: 1.0000 mg | ORAL_TABLET | Freq: Once | ORAL | Status: DC

## 2017-02-15 NOTE — Progress Notes (Signed)
[facility] for Heparin / Warfarin Indication: atrial fibrillation  No Known Allergies  Patient Measurements: Height: 5\' 10"  (177.8 cm) Weight: 206 lb 8 oz (93.7 kg) IBW/kg (Calculated) : 73 Heparin Dosing Weight: 84kg  Vital Signs: Temp: 98.1 F (36.7 C) (06/28 0518) Temp Source: Oral (06/28 0518) BP: 143/74 (06/28 0518) Pulse Rate: 86 (06/28 0518)  Labs:  Recent Labs  02/13/17 0438 02/14/17 0537 02/15/17 0519  HGB 12.0* 12.3* 12.2*  HCT 35.2* 36.9* 35.5*  PLT 277 316 391  LABPROT 20.4* 25.6* 27.6*  INR 1.72 2.29 2.52  HEPARINUNFRC 0.38 0.42 0.55  CREATININE 0.64 0.65 0.70    Estimated Creatinine Clearance: 81.9 mL/min (by C-G formula based on SCr of 0.7 mg/dL).  Assessment: 83 year old male continues on heparin and warfarin for Afib INR trending up = 2.52 today Cipro may cause increase in INR Heparin level therapeutic  Goal of Therapy:  INR 2-3 Heparin level 0.3-0.7 units/ml Monitor platelets by anticoagulation protocol: Yes   Plan:  DC heparin and heparin labs Warfarin 1 mg po x 1 dose at 1800 pm Warfarin 2.5 mg MWF, 5 mg TTSS at discharge  Thank you Anette Guarneri, PharmD [phone]  02/15/2017 9:01 AM

## 2017-02-15 NOTE — Progress Notes (Signed)
Progress Note  Patient Name: Daniel Yu Date of Encounter: 02/15/2017  Primary Cardiologist: Dr. Stanford Yu   Subjective   No chest pain or SOB.   Inpatient Medications    Scheduled Meds: . aspirin EC  81 mg Oral Daily  . budesonide (PULMICORT) nebulizer solution  0.25 mg Nebulization BID  . ciprofloxacin  500 mg Oral BID  . diltiazem  360 mg Oral Daily  . metoprolol tartrate  25 mg Oral Q6H  . sodium chloride flush  3 mL Intravenous Q12H  . sodium chloride flush  3 mL Intravenous Q12H  . Warfarin - Pharmacist Dosing Inpatient   Does not apply q1800   Continuous Infusions: . sodium chloride    . heparin 1,850 Units/hr (02/14/17 2026)   PRN Meds: acetaminophen **OR** acetaminophen, HYDROcodone-acetaminophen, hydroxypropyl methylcellulose / hypromellose, levalbuterol, morphine injection, ondansetron **OR** ondansetron (ZOFRAN) IV, sodium chloride flush   Vital Signs    Vitals:   02/14/17 1334 02/14/17 2141 02/14/17 2312 02/15/17 0518  BP: 132/75 139/80 129/76 (!) 143/74  Pulse: 65 82 82 86  Resp: (!) 22 (!) 27 (!) 24   Temp: 98.1 F (36.7 C) 98.5 F (36.9 C)  98.1 F (36.7 C)  TempSrc: Oral Oral  Oral  SpO2: 96% 97%  97%  Weight:    206 lb 8 oz (93.7 kg)  Height:        Intake/Output Summary (Last 24 hours) at 02/15/17 0854 Last data filed at 02/14/17 1955  Gross per 24 hour  Intake              100 ml  Output                0 ml  Net              100 ml   Filed Weights   02/13/17 0412 02/14/17 0640 02/15/17 0518  Weight: 212 lb 12.8 oz (96.5 kg) 209 lb 1.6 oz (94.8 kg) 206 lb 8 oz (93.7 kg)    Telemetry    NSR, PVCs, 3 bt VT - Personally Reviewed  ECG    pending  Physical Exam   GEN: No acute distress.   Neck: No JVD Cardiac: RRR no murmurs, rubs, or gallops.  Respiratory: Clear to auscultation bilaterally. GI: Soft, nontender, non-distended  MS: No edema; No deformity. Neuro:  Nonfocal  Psych: Normal affect   Labs     Chemistry  Recent Labs Lab 02/09/17 0327 02/10/17 1610 02/11/17 0308  02/13/17 0438 02/13/17 1533 02/14/17 0537 02/15/17 0519  NA 135 134* 137  < > 134*  --  132* 133*  K 3.2* 3.6 3.9  < > 3.3* 3.4* 3.3* 3.6  CL 110 109 110  < > 107  --  104 105  CO2 21* 17* 20*  < > 19*  --  21* 21*  GLUCOSE 113* 166* 148*  < > 113*  --  115* 110*  BUN 13 14 18   < > 9  --  7 8  CREATININE 0.81 0.78 0.76  < > 0.64  --  0.65 0.70  CALCIUM 7.6* 7.5* 7.9*  < > 7.4*  --  7.5* 7.8*  PROT 5.2* 5.6* 5.7*  --   --   --   --   --   ALBUMIN 2.4* 2.4* 2.5*  --   --   --   --   --   AST 26 54* 24  --   --   --   --   --  ALT 22 43 35  --   --   --   --   --   ALKPHOS 51 64 63  --   --   --   --   --   BILITOT 0.8 0.3 0.5  --   --   --   --   --   GFRNONAA >60 >60 >60  < > >60  --  >60 >60  GFRAA >60 >60 >60  < > >60  --  >60 >60  ANIONGAP 4* 8 7  < > 8  --  7 7  < > = values in this interval not displayed.   Hematology  Recent Labs Lab 02/13/17 0438 02/14/17 0537 02/15/17 0519  WBC 9.3 11.6* 14.0*  RBC 3.76* 3.89* 3.78*  HGB 12.0* 12.3* 12.2*  HCT 35.2* 36.9* 35.5*  MCV 93.6 94.9 93.9  MCH 31.9 31.6 32.3  MCHC 34.1 33.3 34.4  RDW 13.2 13.2 13.3  PLT 277 316 391    Cardiac Enzymes  Recent Labs Lab 02/08/17 1656 02/09/17 1014 02/09/17 1812 02/10/17 0047  TROPONINI 1.07* 0.93* 0.69* 0.55*    No results for input(s): TROPIPOC in the last 168 hours.   BNPNo results for input(s): BNP, PROBNP in the last 168 hours.   DDimer No results for input(s): DDIMER in the last 168 hours.   Radiology    PCXR 02/09/17- IMPRESSION: 1. Cardiomegaly with normal pulmonary vascularity.  2. Low lung volumes with mild bibasilar atelectasis. Mild infiltrate right lung base cannot be excluded .   Cardiac Studies   2D Echo 02/09/17 Study Conclusions  - Left ventricle: The cavity size was normal. There was mild concentric hypertrophy. Systolic function was mildly reduced. The estimated  ejection fraction was in the range of 45% to 50%. Diffuse hypokinesis. - Aortic valve: Transvalvular velocity was within the normal range. There was no stenosis. There was no regurgitation. - Mitral valve: Transvalvular velocity was within the normal range. There was no evidence for stenosis. There was mild regurgitation. - Left atrium: The atrium was mildly dilated. - Right ventricle: The cavity size was normal. Wall thickness was normal. Systolic function was normal. - Tricuspid valve: There was mild regurgitation. - Pulmonary arteries: Systolic pressure was within the normal range. PA peak pressure: 32 mm Hg (S).  Impressions:  - Systolic function is mildly reduce. This may be due to tachycardia and atrial fibrillation. Consider repeating a limitied echo at lowe heart rates to better assess systolic function.  Patient Profile     [name] has a PMH significant for asthma, HTN, and SBO (12/2015). He presented to [facility] with complaints of nausea and constipation. Code sepsis was called and he was found to be in Afib RVR. Pt diagnosed with acute cholecystitis>> Status post laparoscopic cholecystectomy 6/22. Troponin was elevated at 1.07, however completely asymptomatic w/o EKG changes. EF 45% by echo with no obvious WMA-? Demand ischemia. Plan is for outpatient NST. Pt underwent TEE CV to NSR 02/14/17 secondary to difficult to control HR.   Assessment & Plan    1. Newly Diagnosed Atrial Fibrillation w/ RVR: in the setting of acute cholecystitis. Now s/p lap chole. 2D echo showed EF of 45-50%. LA mildly dilated. TSH, Mg WNL. He underwent TEE CV 02/14/17 to NSR.    Coumadin for a/c. He is being bridged with heparin, INR > 2 today.   2. Elevated Troponin: level peaked at 1.07. Level trending downward. No significant EKG changes. He has remained completely asymptomatic. EF  45-50% by echo. ? Demand ischemia from rapid afib and acute cholecystitis. Plan is for outpatient NST.  We will arrange.   3. Acute Cholecystitis: s/p lap chole on 6/22. Surgical drain removed earlier today. Post operative management per general surgery.   4. HTN: Controlled   5. Hypokalemia: K 3.6. K-dur 40 mEq ordered.   Plan: NSR with PVCs and 3 bt NSVT- get K+ closer to 4.0. Simplify Lopressor dose to 50 mg BID, continue Diltiazem 360 mg daily. He should be able to go home from cardiology standpoint MD to see yet this am.    INR 2.52- stop Heparin, he will need f/u INR Monday and a TOC OV in 7-10 days- we will arrange.   Signed, Kerin Ransom, PA-C  02/15/2017, 8:54 AM    History and all data above reviewed.  Patient examined.  I agree with the findings as above.  He feels well and is ready to go home.  The patient exam reveals COR:RRR  ,  Lungs: Clear  ,  Abd: Positive bowel sounds, no rebound no guarding, Ext Mild edema  .  All available labs, radiology testing, previous records reviewed. Agree with documented assessment and plan. Atrial fib:  Now in NSR.  Ok to go home.  We will arrange follow up as above.  Anticoagulation is therapeutic.  Can follow with APP/Dr. Stanford Yu for evaluation of elevated troponin and slightly decreased EF.    Minus Breeding  9:35 AM  02/15/2017

## 2017-02-15 NOTE — Discharge Summary (Addendum)
Physician Discharge Summary  Daniel Yu VPX:[id number] DOB: 19-Jun-1932 DOA: 02/08/2017  PCP: Daniel Orn, MD  Admit date: 02/08/2017 Discharge date: 02/15/2017  Time spent: 35 minutes  Recommendations for Outpatient Follow-up:  Sepsis Escherichia coli -Multifactorial to include Acute cholecystitis and Escherichia coli bacteremia -Urine culture negative -Blood cultures: Positive Escherichia coli -Schedule follow-up with Dr. Lavone Yu in 1-2 weeks Escherichia coli bacteremia, new onset A. fib, acute systolic CHF, hypokalemia, hypomagnesemia  Acute systolic CHF -Strict in and out -Daily weight Filed Weights   02/13/17 0412 02/14/17 0640 02/15/17 0518  Weight: 212 lb 12.8 oz (96.5 kg) 209 lb 1.6 oz (94.8 kg) 206 lb 8 oz (93.7 kg)  -Cardizem CD 360 mg daily  -Metoprolol 50 mg QID  New onset Atrial fibrillation -Continue poor control HR as high as 140  -S/P TEE CV on 02/14/17 converted to NSR -Warfarin  Recent Labs Lab 02/11/17 0308 02/12/17 0426 02/13/17 0438 02/14/17 0537 02/15/17 0519  INR 1.15 1.38 1.72 2.29 2.52  -obtain INR at CHF clinic Monday. Will also need to check K/Mg -Schedule follow-up appointment in CHF clinic for 2 weeks post discharge with Dr. Kirk Ruths -Spoke at length with patient and wife concerning use of Coumadin, and changing over to NOAC if desired once surgery clears use.  Essential Hypertension -See CHF   Acute Cholecystitis with necrosis -Abnormal abdominal CT scan-->acute cholecystitis -Resolved -Follow up appointment on 02/27/17 at 10:15 with [facility]   Escherichia coli bacteremia -Complete 2 weeks antibiotics  Hypokalemia -Potassium goal> 4  Asthma -When necessary albuterol -Cont flovent  Anemia -Appears stable    Discharge Diagnoses:  Principal Problem:   Acute cholecystitis Active Problems:   HTN (hypertension)   Asthma   Arthritis   Sepsis ([place])   Atrial fibrillation, new onset (HCC)   Troponin level  elevated   Sepsis due to Escherichia coli (E. coli) (HCC)   Acute systolic CHF (congestive heart failure) ([place])   New onset atrial fibrillation ([facility])   Bacteremia due to Escherichia coli   Hypokalemia   Discharge Condition: Stable  Diet recommendation: AHA  Filed Weights   02/13/17 0412 02/14/17 0640 02/15/17 0518  Weight: 212 lb 12.8 oz (96.5 kg) 209 lb 1.6 oz (94.8 kg) 206 lb 8 oz (93.7 kg)    History of present illness:  83 y.o.WM  PMHx HTN, Asthma ,SBO  Presents emergency room chief complaint of nausea andweakness. Patient states that over last 2 days he's had decreased appetite. Not able to tolerate liquids or solids without some degree nausea. This worsened over time. Patient also developed lower abdominal pain bilaterally. No trauma or sick contacts. Patient went to [facility] at lunch time and then began to have worsening abdominal discomfort. At home later pt went to the rest room and tried to vomit but couldn't. At one point he became weak and slid off the bed onto the floor. At this point his wife activated EMS.  Denies fever, chills, rash, altered mental status, chest pain. Positive for shortness of breath. Did not take his inhaler this morning.  ED course: Started on sepsis protocol. CT revealed inflamed bowel and gallbladder. Right upper quadrant ultrasound showed acute cholecystitis per general surgery. Hospitalists admitted patient. During his hospitalization patient S/P cholecystectomy for acute cholecystitis, and DCCV new onset A. fib. Patient's hospital course, complicated by Escherichia coli bacteremia and acute systolic CHF. Patient started on appropriate antibiotics and appropriate CHF medication. Patient has responded appropriately and will follow-up at CHF clinic. Stable for discharge  Procedures: 6/21 CT abdomen pelvis W contrast:Findings suspicious for acute cholecystitis.  -Colonic wall thickening with liquid stool in the ascending colon, may be  reactive vs less likely primary colitis.  -Dependent lower lobes densities in the lung bases, atelectasis on the left, indeterminate on the right for atelectasis vs pneumonia. 6/21 Ultrasound Abdomen;Gallbladder wall thickening with pericholecystic fluid and large amount of gallbladder sludge suspicious for acute cholecystitis  6/22 Echocardiogram:- LVEF = 45% to 50%., Diffuse hypokinesis. - Pulmonary arteries: PA peak pressure: 32 mm Hg (S).   Consultations: General surgery Dr. Donnie [facility] Cardiology Dr. Kirk Ruths    Culture 6/21   1/2 blood positive Escherichia coli \ 6/21 urine negative final 6/21 MRSA by PCR negative 6/22 Gallbladder:ACUTE CHOLECYSTITIS WITH EXTENSIVE NECROSIS AND ABSCESS    Antibiotics Anti-infectives    Start     Stop   02/13/17 0915  ciprofloxacin (CIPRO) tablet 500 mg     02/21/17 0759   02/12/17 1400  metroNIDAZOLE (FLAGYL) tablet 500 mg  Status:  Discontinued     02/12/17 1004   02/12/17 1030  amoxicillin-clavulanate (AUGMENTIN) 875-125 MG per tablet 1 tablet  Status:  Discontinued     02/13/17 0858   02/12/17 0845  metroNIDAZOLE (FLAGYL) tablet 500 mg  Status:  Discontinued     02/12/17 0838   02/11/17 1500  cefTRIAXone (ROCEPHIN) 2 g in dextrose 5 % 50 mL IVPB  Status:  Discontinued    Comments:  Pharmacy may adjust dose as appropriate   02/12/17 1004   02/08/17 1800  vancomycin (VANCOCIN) IVPB 750 mg/150 ml premix  Status:  Discontinued     02/10/17 1615   02/08/17 1300  piperacillin-tazobactam (ZOSYN) IVPB 3.375 g  Status:  Discontinued     02/11/17 1325   02/08/17 0900  metroNIDAZOLE (FLAGYL) IVPB 500 mg  Status:  Discontinued     02/12/17 0834   02/08/17 0445  vancomycin (VANCOCIN) IVPB 1000 mg/200 mL premix     02/08/17 0633   02/08/17 0445  piperacillin-tazobactam (ZOSYN) IVPB 3.375 g     02/08/17 0525       Discharge Exam: Vitals:   02/14/17 2141 02/14/17 2312 02/15/17 0518 02/15/17 1003  BP: 139/80 129/76 (!) 143/74    Pulse: 82 82 86   Resp: (!) 27 (!) 24    Temp: 98.5 F (36.9 C)  98.1 F (36.7 C)   TempSrc: Oral  Oral   SpO2: 97%  97% 97%  Weight:   206 lb 8 oz (93.7 kg)   Height:        General: A/O 4, No acute respiratory distress Eyes: negative scleral hemorrhage, negative anisocoria, negative icterus ENT: Negative Runny nose, negative gingival bleeding, Neck:  Negative scars, masses, torticollis, lymphadenopathy, JVD Lungs: Clear to auscultation bilaterally without wheezes or crackles Cardiovascular: Regular rate and rhythm without murmur gallop or rub normal S1 and S2 Abdomen: negative abdominal pain, nondistended, positive soft, bowel sounds, no rebound, no ascites, no appreciable mass   Discharge Instructions   Allergies as of 02/15/2017   No Known Allergies     Medication List    STOP taking these medications   chlorproMAZINE 10 MG tablet Commonly known as:  THORAZINE   valsartan-hydrochlorothiazide 160-12.5 MG tablet Commonly known as:  DIOVAN-HCT     TAKE these medications   acetaminophen 500 MG tablet Commonly known as:  TYLENOL Take 1,000 mg by mouth every 6 (six) hours as needed for mild pain.   aspirin 81 MG EC tablet Take  1 tablet (81 mg total) by mouth daily. Start taking on:  02/16/2017   cetirizine 10 MG tablet Commonly known as:  ZYRTEC Take 10 mg by mouth daily.   ciprofloxacin 500 MG tablet Commonly known as:  CIPRO Take 1 tablet (500 mg total) by mouth 2 (two) times daily.   diltiazem 360 MG 24 hr capsule Commonly known as:  CARDIZEM CD Take 1 capsule (360 mg total) by mouth daily. Start taking on:  02/16/2017   fluticasone 110 MCG/ACT inhaler Commonly known as:  FLOVENT HFA Inhale 1 puff into the lungs 2 (two) times daily.   HYDROcodone-acetaminophen 5-325 MG tablet Commonly known as:  NORCO/VICODIN Take 1-2 tablets by mouth every 4 (four) hours as needed for moderate pain.   metoprolol tartrate 50 MG tablet Commonly known as:   LOPRESSOR Take 1 tablet (50 mg total) by mouth 2 (two) times daily.   ondansetron 4 MG tablet Commonly known as:  ZOFRAN Take 1 tablet (4 mg total) by mouth every 6 (six) hours as needed for nausea.   warfarin 2.5 MG tablet Commonly known as:  COUMADIN Take 1 tablet (2.5 mg total) by mouth every Monday, Wednesday, and Friday at 6 PM. Start taking on:  02/16/2017   warfarin 5 MG tablet Commonly known as:  COUMADIN Take 1 tablet (5 mg total) by mouth every Tuesday, Thursday, Saturday, and Sunday at 6 PM. Start taking on:  02/17/2017      No Known Allergies Follow-up Information    [facility], [place]. Go on 02/27/2017.   Specialty:  General Surgery Why:  Your appointment is 02/27/17 at 10:15AM. Please arrive 30 minutes prior to your appointment to check in and fill out necessary paperwork. Contact information: [address] [place] [phone]       Lelon Perla, MD. Schedule an appointment as soon as possible for a visit in 2 week(s).   Specialty:  Cardiology Why:  Schedule follow-up appointment in CHF clinic for 2 weeks post discharge with Dr. Kirk Ruths Contact information: [address] [place] [address] [phone]        Daniel Orn, MD. Schedule an appointment as soon as possible for a visit in 2 week(s).   Specialty:  Internal Medicine Why:  Schedule follow-up with Dr. Lavone Yu in 1-2 weeks Escherichia coli bacteremia, new onset A. fib, acute systolic CHF, hypokalemia, hypomagnesemia Contact information: [address] [place] [address] [phone]            The results of significant diagnostics from this hospitalization (including imaging, microbiology, ancillary and laboratory) are listed below for reference.    Significant Diagnostic Studies: Dg Cholangiogram Operative  Result Date: 02/09/2017 CLINICAL DATA:  83 year old male with a history of  cholelithiasis EXAM: INTRAOPERATIVE CHOLANGIOGRAM TECHNIQUE: Cholangiographic images from the C-arm fluoroscopic device were submitted for interpretation post-operatively. Please see the procedural report for the amount of contrast and the fluoroscopy time utilized. COMPARISON:  None. FINDINGS: Surgical instruments project over the upper abdomen. There is cannulation of the cystic duct/gallbladder neck, with antegrade infusion of contrast. Caliber of the extrahepatic ductal system within normal limits. No large filling defect identified. Free flow of contrast across the ampulla. IMPRESSION: Intraoperative cholangiogram demonstrates extrahepatic biliary ducts of unremarkable caliber, with no large filling defect identified. Free flow of contrast across the ampulla. Please refer to the dictated operative report for full details of intraoperative findings and procedure Electronically Signed  By: Corrie Mckusick D.O.   On: 02/09/2017 17:17   Ct Abdomen Pelvis W Contrast  Result Date: 02/08/2017 CLINICAL DATA:  Diffuse abdominal cramping.  Sepsis. EXAM: CT ABDOMEN AND PELVIS WITH CONTRAST TECHNIQUE: Multidetector CT imaging of the abdomen and pelvis was performed using the standard protocol following bolus administration of intravenous contrast. CONTRAST:  173mL ISOVUE-300 IOPAMIDOL (ISOVUE-300) INJECTION 61% COMPARISON:  Radiographs earlier this day.  CT 01/03/2016 FINDINGS: Lower chest: Coronary artery calcifications. Lower lobe bronchial thickening. Dependent lower lobe densities, atelectasis on the left, indeterminate on the right. Hepatobiliary: Gallbladder is distended with gallbladder wall thickening and adjacent pericholecystic fluid. No calcified gallstones. There is no biliary dilatation. No focal hepatic lesion. Pancreas: No ductal dilatation or inflammation. Spleen: Normal in size without focal abnormality. Adrenals/Urinary Tract: Normal adrenal glands. No hydronephrosis. Small volume of right perinephric  fluid may be sympathetic. Symmetric renal excretion on delayed phase imaging. Urinary bladder is nondistended. Stomach/Bowel: The stomach is decompressed. Fluid within upper abdominal and lower pelvic bowel lobes which are nondilated. Minimal liquid stool in the cecum and transverse colon. Formed stool in the more distal colon. No colonic inflammation. There is wall thickening of the cecum and ascending colon. The dilated small bowel loop in radiograph is not present. The appendix is not confidently visualized. Vascular/Lymphatic: Aortic and branch atherosclerosis without aneurysm. No adenopathy. Reproductive: Prominent prostate gland. Other: Small volume of free fluid tracks in the pelvis. No free air. No intra-abdominal abscess. Musculoskeletal: Degenerative change throughout the spine and in both hips. There are no acute or suspicious osseous abnormalities. IMPRESSION: 1. Findings suspicious for acute cholecystitis. No calcified gallstones are seen, however there is gallbladder distention, wall thickening and pericholecystic fluid. 2. Colonic wall thickening with liquid stool in the ascending colon, may be reactive versus less likely primary colitis. Fluid-filled nondilated small bowel, mild ileus. 3. Minimal right perinephric fluid is likely reactive. No evidence pyelonephritis. 4. Dependent lower lobes densities in the lung bases, atelectasis on the left, indeterminate on the right for atelectasis versus pneumonia. 5. Aortic atherosclerosis. Electronically Signed   By: Jeb Levering M.D.   On: 02/08/2017 06:33   Dg Chest Port 1 View  Result Date: 02/09/2017 CLINICAL DATA:  Wheezing. EXAM: PORTABLE CHEST 1 VIEW COMPARISON:  02/08/2017. FINDINGS: Mediastinum and hilar structures normal. Cardiomegaly with normal pulmonary vascularity. Low lung volumes with mild basilar atelectasis. Mild infiltrate right lung base cannot be excluded. Stable calcified density left apex, most likely calcified granuloma. No  pleural effusion or pneumothorax . IMPRESSION: 1. Cardiomegaly with normal pulmonary vascularity. 2. Low lung volumes with mild bibasilar atelectasis. Mild infiltrate right lung base cannot be excluded . Electronically Signed   By: Marcello Moores  Register   On: 02/09/2017 07:38   Dg Abd Acute W/chest  Result Date: 02/08/2017 CLINICAL DATA:  Abdominal cramping and lower abdominal pain. Hypotension. EXAM: DG ABDOMEN ACUTE W/ 1V CHEST COMPARISON:  Radiographs and CT May 2017 FINDINGS: The cardiomediastinal contours are normal. Atherosclerosis of the thoracic aorta. Mild bibasilar atelectasis. Mild elevation of right hemidiaphragm. There is no free intra-abdominal air. Dilated small bowel in the central abdomen measures up to 4.6 cm. Air within normal caliber transverse colon. Small volume of distal colonic stool. There is otherwise relative paucity of bowel gas. No radiopaque calculi. No acute osseous abnormalities are seen. Degenerative change throughout spine. IMPRESSION: Dilated central small bowel. Air within the colon. Findings may reflect ileus or enteritis, overall nonspecific bowel gas pattern. Mild bibasilar atelectasis.  Thoracic aortic atherosclerosis. Electronically  Signed   By: Jeb Levering M.D.   On: 02/08/2017 04:57   US Abdomen Limited Ruq  Result Date: 02/08/2017 CLINICAL DATA:  83 year old male with right upper quadrant abdominal pain for 2 days. EXAM: ULTRASOUND ABDOMEN LIMITED RIGHT UPPER QUADRANT COMPARISON:  02/08/2017 CT FINDINGS: Gallbladder: A large amount of gallbladder sludge noted. Gallbladder wall thickening and pericholecystic fluid noted. No definite cholelithiasis or sonographic Murphy's sign. Common bile duct: Diameter: 3 mm. There is no evidence of intrahepatic or extrahepatic biliary dilatation. Liver: No focal lesion identified. Within normal limits in parenchymal echogenicity. A small amount of free fluid in Morison's pouch noted. IMPRESSION: Gallbladder wall thickening with  pericholecystic fluid and large amount of gallbladder sludge suspicious for acute cholecystitis in conjunction with CT findings. No biliary dilatation. Electronically Signed   By: Margarette Canada M.D.   On: 02/08/2017 07:42    Microbiology: Recent Results (from the past 240 hour(s))  Blood Culture (routine x 2)     Status: Abnormal   Collection Time: 02/08/17  4:10 AM  Result Value Ref Range Status   Specimen Description BLOOD RIGHT HAND  Final   Special Requests   Final    BOTTLES DRAWN AEROBIC AND ANAEROBIC Blood Culture adequate volume   Culture  Setup Time   Final    GRAM NEGATIVE RODS ANAEROBIC BOTTLE ONLY CRITICAL RESULT CALLED TO, READ BACK BY AND VERIFIED WITH: A JONES,PHARMD AT 1301 02/11/17 BY L BENFIELD    Culture ESCHERICHIA COLI (A)  Final   Report Status 02/13/2017 FINAL  Final   Organism ID, Bacteria ESCHERICHIA COLI  Final      Susceptibility   Escherichia coli - MIC*    AMPICILLIN >=32 RESISTANT Resistant     CEFAZOLIN >=64 RESISTANT Resistant     CEFEPIME <=1 SENSITIVE Sensitive     CEFTAZIDIME 2 SENSITIVE Sensitive     CEFTRIAXONE <=1 SENSITIVE Sensitive     CIPROFLOXACIN <=0.25 SENSITIVE Sensitive     GENTAMICIN <=1 SENSITIVE Sensitive     IMIPENEM <=0.25 SENSITIVE Sensitive     TRIMETH/SULFA <=20 SENSITIVE Sensitive     AMPICILLIN/SULBACTAM 16 INTERMEDIATE Intermediate     PIP/TAZO 8 SENSITIVE Sensitive     Extended ESBL NEGATIVE Sensitive     * ESCHERICHIA COLI  Blood Culture ID Panel (Reflexed)     Status: Abnormal   Collection Time: 02/08/17  4:10 AM  Result Value Ref Range Status   Enterococcus species NOT DETECTED NOT DETECTED Final   Listeria monocytogenes NOT DETECTED NOT DETECTED Final   Staphylococcus species NOT DETECTED NOT DETECTED Final   Staphylococcus aureus NOT DETECTED NOT DETECTED Final   Streptococcus species NOT DETECTED NOT DETECTED Final   Streptococcus agalactiae NOT DETECTED NOT DETECTED Final   Streptococcus pneumoniae NOT DETECTED  NOT DETECTED Final   Streptococcus pyogenes NOT DETECTED NOT DETECTED Final   Acinetobacter baumannii NOT DETECTED NOT DETECTED Final   Enterobacteriaceae species DETECTED (A) NOT DETECTED Final    Comment: Enterobacteriaceae represent a large family of gram-negative bacteria, not a single organism. CRITICAL RESULT CALLED TO, READ BACK BY AND VERIFIED WITH: A JONES,PHARMD AT 1301 02/11/17 BY L BENFIELD    Enterobacter cloacae complex NOT DETECTED NOT DETECTED Final   Escherichia coli DETECTED (A) NOT DETECTED Final    Comment: CRITICAL RESULT CALLED TO, READ BACK BY AND VERIFIED WITH: A JONES,PHARMD AT 1301 02/11/17 BY L BENFIELD    Klebsiella oxytoca NOT DETECTED NOT DETECTED Final   Klebsiella pneumoniae NOT DETECTED NOT DETECTED  Final   Proteus species NOT DETECTED NOT DETECTED Final   Serratia marcescens NOT DETECTED NOT DETECTED Final   Carbapenem resistance NOT DETECTED NOT DETECTED Final   Haemophilus influenzae NOT DETECTED NOT DETECTED Final   Neisseria meningitidis NOT DETECTED NOT DETECTED Final   Pseudomonas aeruginosa NOT DETECTED NOT DETECTED Final   Candida albicans NOT DETECTED NOT DETECTED Final   Candida glabrata NOT DETECTED NOT DETECTED Final   Candida krusei NOT DETECTED NOT DETECTED Final   Candida parapsilosis NOT DETECTED NOT DETECTED Final   Candida tropicalis NOT DETECTED NOT DETECTED Final  Blood Culture (routine x 2)     Status: None   Collection Time: 02/08/17  4:21 AM  Result Value Ref Range Status   Specimen Description BLOOD LEFT HAND  Final   Special Requests   Final    BOTTLES DRAWN AEROBIC AND ANAEROBIC Blood Culture adequate volume   Culture NO GROWTH 5 DAYS  Final   Report Status 02/13/2017 FINAL  Final  Urine culture     Status: None   Collection Time: 02/08/17  5:23 AM  Result Value Ref Range Status   Specimen Description URINE, CATHETERIZED  Final   Special Requests NONE  Final   Culture NO GROWTH  Final   Report Status 02/09/2017 FINAL   Final  Surgical pcr screen     Status: None   Collection Time: 02/08/17  2:09 PM  Result Value Ref Range Status   MRSA, PCR NEGATIVE NEGATIVE Final   Staphylococcus aureus NEGATIVE NEGATIVE Final    Comment:        The Xpert SA Assay (FDA approved for NASAL specimens in patients over 55 years of age), is one component of a comprehensive surveillance program.  Test performance has been validated by [facility] for patients greater than or equal to 83 year old. It is not intended to diagnose infection nor to guide or monitor treatment.      Labs: Basic Metabolic Panel:  Recent Labs Lab 02/08/17 1656  02/10/17 0047 02/11/17 0308 02/12/17 6301 02/13/17 6010 02/13/17 1533 02/14/17 0537 02/15/17 0519 02/15/17 1205  NA  --   < > 134* 137 137 134*  --  132* 133*  --   K  --   < > 3.6 3.9 3.5 3.3* 3.4* 3.3* 3.6 4.2  CL  --   < > 109 110 111 107  --  104 105  --   CO2  --   < > 17* 20* 20* 19*  --  21* 21*  --   GLUCOSE  --   < > 166* 148* 112* 113*  --  115* 110*  --   BUN  --   < > 14 18 15 9   --  7 8  --   CREATININE  --   < > 0.78 0.76 0.70 0.64  --  0.65 0.70  --   CALCIUM  --   < > 7.5* 7.9* 7.5* 7.4*  --  7.5* 7.8*  --   MG 1.9  --  2.0  --   --   --  1.8  --   --  1.9  PHOS 2.6  --   --   --   --   --   --   --   --   --   < > = values in this interval not displayed. Liver Function Tests:  Recent Labs Lab 02/09/17 0327 02/10/17 0047 02/11/17 0308  AST 26 54* 24  ALT 22 43 35  ALKPHOS 51 64 63  BILITOT 0.8 0.3 0.5  PROT 5.2* 5.6* 5.7*  ALBUMIN 2.4* 2.4* 2.5*   No results for input(s): LIPASE, AMYLASE in the last 168 hours. No results for input(s): AMMONIA in the last 168 hours. CBC:  Recent Labs Lab 02/10/17 0047 02/11/17 0308 02/12/17 0426 02/13/17 0438 02/14/17 0537 02/15/17 0519  WBC 12.0* 15.7* 9.5 9.3 11.6* 14.0*  NEUTROABS 11.1*  --   --   --   --   --   HGB 11.4* 11.7* 11.7* 12.0* 12.3* 12.2*  HCT 33.0* 34.5* 34.8* 35.2* 36.9* 35.5*  MCV  95.7 95.0 94.6 93.6 94.9 93.9  PLT 156 198 218 277 316 391   Cardiac Enzymes:  Recent Labs Lab 02/08/17 1656 02/09/17 1014 02/09/17 1812 02/10/17 0047  TROPONINI 1.07* 0.93* 0.69* 0.55*   BNP: BNP (last 3 results) No results for input(s): BNP in the last 8760 hours.  ProBNP (last 3 results) No results for input(s): PROBNP in the last 8760 hours.  CBG: No results for input(s): GLUCAP in the last 168 hours.     Signed:  Dia Crawford, MD [facility] [phone] pager

## 2017-02-15 NOTE — Care Management Important Message (Signed)
Important Message  Patient Details  Name: Daniel Yu MRN: [id number] Date of Birth: 01-Dec-1931   Medicare Important Message Given:  Yes    Gladyse Corvin Abena 02/15/2017, 10:10 AM

## 2017-02-16 ENCOUNTER — Encounter (HOSPITAL_COMMUNITY): Payer: Self-pay | Admitting: Cardiovascular Disease

## 2017-02-16 NOTE — Anesthesia Postprocedure Evaluation (Signed)
Anesthesia Post Note  Patient: SALAAM BATTERSHELL  Procedure(s) Performed: Procedure(s) (LRB): TRANSESOPHAGEAL ECHOCARDIOGRAM (TEE) (N/A) CARDIOVERSION (N/A)     Patient location during evaluation: Endoscopy Anesthesia Type: General Level of consciousness: awake and alert Pain management: pain level controlled Vital Signs Assessment: post-procedure vital signs reviewed and stable Respiratory status: spontaneous breathing, nonlabored ventilation, respiratory function stable and patient connected to nasal cannula oxygen Cardiovascular status: blood pressure returned to baseline and stable Postop Assessment: no signs of nausea or vomiting Anesthetic complications: no    Last Vitals:  Vitals:   02/14/17 2312 02/15/17 0518  BP: 129/76 (!) 143/74  Pulse: 82 86  Resp: (!) 24   Temp:  36.7 C    Last Pain:  Vitals:   02/15/17 0737  TempSrc:   PainSc: 0-No pain                 Eartha Vonbehren,JAMES TERRILL

## 2017-02-19 ENCOUNTER — Ambulatory Visit (INDEPENDENT_AMBULATORY_CARE_PROVIDER_SITE_OTHER): Payer: Medicare Other | Admitting: Pharmacist

## 2017-02-19 DIAGNOSIS — Z7901 Long term (current) use of anticoagulants: Secondary | ICD-10-CM | POA: Diagnosis not present

## 2017-02-19 DIAGNOSIS — I4891 Unspecified atrial fibrillation: Secondary | ICD-10-CM

## 2017-02-19 LAB — POCT INR: INR: 2.7

## 2017-03-01 ENCOUNTER — Ambulatory Visit (INDEPENDENT_AMBULATORY_CARE_PROVIDER_SITE_OTHER): Payer: Medicare Other | Admitting: Physician Assistant

## 2017-03-01 ENCOUNTER — Encounter: Payer: Self-pay | Admitting: Physician Assistant

## 2017-03-01 ENCOUNTER — Ambulatory Visit (INDEPENDENT_AMBULATORY_CARE_PROVIDER_SITE_OTHER): Payer: Medicare Other | Admitting: Pharmacist

## 2017-03-01 VITALS — BP 110/56 | HR 60 | Ht 70.0 in | Wt 183.0 lb

## 2017-03-01 DIAGNOSIS — I480 Paroxysmal atrial fibrillation: Secondary | ICD-10-CM | POA: Diagnosis not present

## 2017-03-01 DIAGNOSIS — I5022 Chronic systolic (congestive) heart failure: Secondary | ICD-10-CM

## 2017-03-01 DIAGNOSIS — R748 Abnormal levels of other serum enzymes: Secondary | ICD-10-CM

## 2017-03-01 DIAGNOSIS — I10 Essential (primary) hypertension: Secondary | ICD-10-CM

## 2017-03-01 DIAGNOSIS — I4891 Unspecified atrial fibrillation: Secondary | ICD-10-CM

## 2017-03-01 DIAGNOSIS — Z7901 Long term (current) use of anticoagulants: Secondary | ICD-10-CM

## 2017-03-01 LAB — POCT INR: INR: 1.4

## 2017-03-01 MED ORDER — DILTIAZEM HCL ER COATED BEADS 120 MG PO CP24: 120.0000 mg | ORAL_CAPSULE | Freq: Every day | ORAL | 3 refills | Status: DC

## 2017-03-01 MED ORDER — METOPROLOL TARTRATE 100 MG PO TABS: 100.0000 mg | ORAL_TABLET | Freq: Two times a day (BID) | ORAL | 3 refills | Status: DC

## 2017-03-01 NOTE — Progress Notes (Signed)
Cardiology Office Note    Date:  03/02/2017   ID:  [name], DOB 1932-06-06, MRN [id number]  PCP:  Lavone Orn, MD  Cardiologist:  Dr. Stanford Breed  Chief Complaint  Patient presents with  . Follow-up    seen for Dr. Stanford Breed.     History of Present Illness:  Daniel Yu is a 83 y.o. male with PMH of asthma, HTN, SBO and PAF. He recently presented to the hospital on 02/08/2017 was nausea and constipation found to be in atrial fibrillation with RVR. Echocardiogram obtained on 02/09/2017 showed EF 45-50%, diffuse hypokinesis, mild MR, mild TR, PA peak pressure 32 mmHg. He was also discovered to have acute cholecystitis and underwent laparoscopic cholecystectomy on 6/22 as well. Troponin was elevated at 1.07. However he was completely asymptomatic without EKG changes, likely demand ischemia. We planned for outpatient nuclear stress test. He was placed on Coumadin therapy. He was rate controlled on both diltiazem and metoprolol. Hypokalemia was treated with supplements. He underwent TEE cardioversion on 02/14/2017 and this successfully converted to sinus rhythm with a single shock. During this admission, he also had blood culture was positive for Escherichia coli. He has follow-up with his primary care physician scheduled.  He has been doing well since he left the hospital. His wife did notice he likely went back into atrial fibrillation with irregular heart rate the first few days after he left the hospital, however since then he has been able to maintain sinus rhythm. She noticed his heart rate was fluctuating on the BP machine when she was measuring his blood pressure. She denies any recent chest discomfort or shortness of breath. There is no significant lower extremity edema, orthopnea or PND. We will obtain outpatient stress test given elevated troponin and recent LV dysfunction.   Past Medical History:  Diagnosis Date  . Arthritis    right knee oa  . Asthma   . Carpal tunnel syndrome     BILATERAL  . Cholecystitis   . Complication of anesthesia    adverse reaction to the spinal  . Hypertension   . SBO (small bowel obstruction) ([place]) 12/2015    Past Surgical History:  Procedure Laterality Date  . CARDIOVERSION N/A 02/14/2017   Procedure: CARDIOVERSION;  Surgeon: Josue Hector, MD;  Location: [facility] ENDOSCOPY;  Service: Cardiovascular;  Laterality: N/A;  . CHOLECYSTECTOMY  02/09/2017  . CHOLECYSTECTOMY N/A 02/09/2017   Procedure: LAPAROSCOPIC CHOLECYSTECTOMY WITH INTRAOPERATIVE CHOLANGIOGRAM;  Surgeon: Donnie Mesa, MD;  Location: [place];  Service: General;  Laterality: N/A;  . COLONOSCOPY WITH PROPOFOL N/A 11/09/2014   Procedure: COLONOSCOPY WITH PROPOFOL;  Surgeon: Garlan Fair, MD;  Location: [facility] ENDOSCOPY;  Service: Endoscopy;  Laterality: N/A;  . HERNIA REPAIR     2013  . QUADRICEPS TENDON REPAIR Left 06/07/2015   Procedure: REPAIR LEFT QUADRICEP TENDON;  Surgeon: Gaynelle Arabian, MD;  Location: [facility] ORS;  Service: Orthopedics;  Laterality: Left;  . TEE WITHOUT CARDIOVERSION N/A 02/14/2017   Procedure: TRANSESOPHAGEAL ECHOCARDIOGRAM (TEE);  Surgeon: Josue Hector, MD;  Location: [facility] ENDOSCOPY;  Service: Cardiovascular;  Laterality: N/A;  . TOTAL KNEE ARTHROPLASTY Right 12/27/2015   Procedure: RIGHT TOTAL KNEE ARTHROPLASTY;  Surgeon: Gaynelle Arabian, MD;  Location: [facility] ORS;  Service: Orthopedics;  Laterality: Right;    Current Medications: Outpatient Medications Prior to Visit  Medication Sig Dispense Refill  . acetaminophen (TYLENOL) 500 MG tablet Take 1,000 mg by mouth every 6 (six) hours as needed for mild pain.    Marland Kitchen  cetirizine (ZYRTEC) 10 MG tablet Take 10 mg by mouth daily.    . fluticasone (FLOVENT HFA) 110 MCG/ACT inhaler Inhale 1 puff into the lungs 2 (two) times daily.     Marland Kitchen warfarin (COUMADIN) 2.5 MG tablet Take 1 tablet (2.5 mg total) by mouth every Monday, Wednesday, and Friday at 6 PM. 30 tablet 0  . warfarin (COUMADIN) 5 MG tablet Take 1 tablet (5 mg total)  by mouth every Tuesday, Thursday, Saturday, and Sunday at 6 PM. 30 tablet 0  . aspirin EC 81 MG EC tablet Take 1 tablet (81 mg total) by mouth daily. 1 tablet 0  . diltiazem (CARDIZEM CD) 360 MG 24 hr capsule Take 1 capsule (360 mg total) by mouth daily. 30 capsule 0  . metoprolol tartrate (LOPRESSOR) 50 MG tablet Take 1 tablet (50 mg total) by mouth 2 (two) times daily. 60 tablet 0  . ciprofloxacin (CIPRO) 500 MG tablet Take 1 tablet (500 mg total) by mouth 2 (two) times daily. (Patient not taking: Reported on 03/01/2017) 16 tablet 0  . HYDROcodone-acetaminophen (NORCO/VICODIN) 5-325 MG tablet Take 1-2 tablets by mouth every 4 (four) hours as needed for moderate pain. (Patient not taking: Reported on 03/01/2017) 10 tablet 0  . ondansetron (ZOFRAN) 4 MG tablet Take 1 tablet (4 mg total) by mouth every 6 (six) hours as needed for nausea. (Patient not taking: Reported on 03/01/2017) 20 tablet 0   No facility-administered medications prior to visit.      Allergies:   Patient has no known allergies.   Social History   Social History  . Marital status: Married    Spouse name: N/A  . Number of children: N/A  . Years of education: N/A   Social History Main Topics  . Smoking status: Never Smoker  . Smokeless tobacco: Never Used  . Alcohol use Yes     Comment:  2 beers daily  . Drug use: No  . Sexual activity: Not Asked   Other Topics Concern  . None   Social History Narrative  . None     Family History:  The patient's family history includes Hypertension in his father.   ROS:   Please see the history of present illness.    ROS All other systems reviewed and are negative.   PHYSICAL EXAM:   VS:  BP (!) 110/56   Pulse 60   Ht 5\' 10"  (1.778 m)   Wt 183 lb (83 kg)   BMI 26.26 kg/m    GEN: Well nourished, well developed, in no acute distress  HEENT: normal  Neck: no JVD, carotid bruits, or masses Cardiac: RRR; no murmurs, rubs, or gallops,no edema  Respiratory:  clear to  auscultation bilaterally, normal work of breathing GI: soft, nontender, nondistended, + BS MS: no deformity or atrophy  Skin: warm and dry, no rash Neuro:  Alert and Oriented x 3, Strength and sensation are intact Psych: euthymic mood, full affect  Wt Readings from Last 3 Encounters:  03/01/17 183 lb (83 kg)  02/15/17 206 lb 8 oz (93.7 kg)  01/10/16 214 lb 15.2 oz (97.5 kg)      Studies/Labs Reviewed:   EKG:  EKG is not ordered today.    Recent Labs: 02/08/2017: TSH 1.834 02/11/2017: ALT 35 02/15/2017: BUN 8; Creatinine, Ser 0.70; Hemoglobin 12.2; Magnesium 1.9; Platelets 391; Potassium 4.2; Sodium 133   Lipid Panel No results found for: CHOL, TRIG, HDL, CHOLHDL, VLDL, LDLCALC, LDLDIRECT  Additional studies/ records that were reviewed today  include:   Echo 02/09/2017 LV EF: 45% -   50%  ------------------------------------------------------------------- Indications:      Atrial fibrillation - 427.31.  ------------------------------------------------------------------- History:   Risk factors:  Hypertension.  ------------------------------------------------------------------- Study Conclusions  - Left ventricle: The cavity size was normal. There was mild   concentric hypertrophy. Systolic function was mildly reduced. The   estimated ejection fraction was in the range of 45% to 50%.   Diffuse hypokinesis. - Aortic valve: Transvalvular velocity was within the normal range.   There was no stenosis. There was no regurgitation. - Mitral valve: Transvalvular velocity was within the normal range.   There was no evidence for stenosis. There was mild regurgitation. - Left atrium: The atrium was mildly dilated. - Right ventricle: The cavity size was normal. Wall thickness was   normal. Systolic function was normal. - Tricuspid valve: There was mild regurgitation. - Pulmonary arteries: Systolic pressure was within the normal   range. PA peak pressure: 32 mm Hg  (S).  Impressions:  - Systolic function is mildly reduce. This may be due to   tachycardia and atrial fibrillation. Consider repeating a   limitied echo at lowe heart rates to better assess systolic   function.   ASSESSMENT:    1. Paroxysmal atrial fibrillation (HCC)   2. Chronic systolic heart failure (HCC)   3. Cardiac enzymes elevated   4. Essential hypertension      PLAN:  In order of problems listed above:  1. Paroxysmal atrial fibrillation: On Coumadin. INR was low today. Recently underwent gallbladder surgery, once he would carve on the gallbladder surgery, we'll need to consider transition him to [place]. We did discuss benefit and risk of eliquis and Xarelto today. He is maintaining sinus rhythm. Given LV dysfunction, I will transition his Cardizem to beta blocker. Decrease diltiazem CD to 120 mg daily while increase metoprolol to 100 mg twice a day. Discontinue aspirin.  2. Chronic systolic heart failure: Elevated troponin recently, EF 45-50%. 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 He was also having cholecystitis at the same time as well. Plan for outpatient Lexiscan Myoview.  3. Hypertension: Blood pressure stable.    Medication Adjustments/Labs and Tests Ordered: Current medicines are reviewed at length with the patient today.  Concerns regarding medicines are outlined above.  Medication changes, Labs and Tests ordered today are listed in the Patient Instructions below. Patient Instructions  Medication Instructions:   STOP Aspirin  INCREASE metoprolol to 100mg  TWICE DAILY  DECREASE Diltiazem to 120mg  ONCE DAILY   Labwork:   none  Testing/Procedures:  Your physician has requested that you have a lexiscan myoview. For further information please visit [email or web address]. Please follow instruction sheet, as given.   Follow-Up:  With Dr. Stanford Breed in 2-3 months Please monitor your blood pressure at home and notify if concerns/BP  elevations.  If you need a refill on your cardiac medications before your next appointment, please call your pharmacy.      Daniel Yu, [place]  03/02/2017 1:01 PM    [facility] [address], [place], [place]  [address] Phone: [phone]; Fax: [phone]

## 2017-03-01 NOTE — Patient Instructions (Addendum)
Medication Instructions:   STOP Aspirin  INCREASE metoprolol to 100mg  TWICE DAILY  DECREASE Diltiazem to 120mg  ONCE DAILY   Labwork:   none  Testing/Procedures:  Your physician has requested that you have a lexiscan myoview. For further information please visit [email or web address]. Please follow instruction sheet, as given.   Follow-Up:  With Dr. Stanford Breed in 2-3 months Please monitor your blood pressure at home and notify if concerns/BP elevations.  If you need a refill on your cardiac medications before your next appointment, please call your pharmacy.

## 2017-03-02 ENCOUNTER — Encounter: Payer: Self-pay | Admitting: Physician Assistant

## 2017-03-06 ENCOUNTER — Telehealth (HOSPITAL_COMMUNITY): Payer: Self-pay

## 2017-03-06 NOTE — Telephone Encounter (Signed)
Encounter complete. 

## 2017-03-07 ENCOUNTER — Telehealth (HOSPITAL_COMMUNITY): Payer: Self-pay

## 2017-03-07 NOTE — Telephone Encounter (Signed)
Encounter complete. 

## 2017-03-08 ENCOUNTER — Ambulatory Visit (HOSPITAL_COMMUNITY)
Admission: RE | Admit: 2017-03-08 | Discharge: 2017-03-08 | Disposition: A | Discharge status: Home / Self Care | Payer: Medicare Other | Source: Ambulatory Visit | Attending: Cardiology | Admitting: Cardiology

## 2017-03-08 DIAGNOSIS — I480 Paroxysmal atrial fibrillation: Secondary | ICD-10-CM | POA: Diagnosis not present

## 2017-03-08 DIAGNOSIS — R42 Dizziness and giddiness: Secondary | ICD-10-CM | POA: Insufficient documentation

## 2017-03-08 DIAGNOSIS — I509 Heart failure, unspecified: Secondary | ICD-10-CM | POA: Insufficient documentation

## 2017-03-08 DIAGNOSIS — I5022 Chronic systolic (congestive) heart failure: Secondary | ICD-10-CM

## 2017-03-08 DIAGNOSIS — I2510 Atherosclerotic heart disease of native coronary artery without angina pectoris: Secondary | ICD-10-CM | POA: Diagnosis present

## 2017-03-08 DIAGNOSIS — R748 Abnormal levels of other serum enzymes: Secondary | ICD-10-CM

## 2017-03-08 DIAGNOSIS — M1990 Unspecified osteoarthritis, unspecified site: Secondary | ICD-10-CM | POA: Insufficient documentation

## 2017-03-08 DIAGNOSIS — I110 Hypertensive heart disease with heart failure: Secondary | ICD-10-CM | POA: Diagnosis not present

## 2017-03-08 LAB — MYOCARDIAL PERFUSION IMAGING
CHL CUP NUCLEAR SDS: 0
CHL CUP NUCLEAR SRS: 1
CSEPPHR: 71 {beats}/min
LV dias vol: 154 mL (ref 62–150)
LV sys vol: 89 mL
Rest HR: 54 {beats}/min
SSS: 1
TID: 1.14

## 2017-03-08 MED ORDER — REGADENOSON 0.4 MG/5ML IV SOLN
0.4000 mg | Freq: Once | INTRAVENOUS | Status: AC
  Administered 2017-03-08: 0.4 mg via INTRAVENOUS

## 2017-03-08 MED ORDER — TECHNETIUM TC 99M TETROFOSMIN IV KIT
30.5000 | PACK | Freq: Once | INTRAVENOUS | Status: AC | PRN
  Administered 2017-03-08: 30.5 via INTRAVENOUS
  Filled 2017-03-08: qty 31

## 2017-03-08 MED ORDER — TECHNETIUM TC 99M TETROFOSMIN IV KIT
9.2000 | PACK | Freq: Once | INTRAVENOUS | Status: AC | PRN
  Administered 2017-03-08: 9.2 via INTRAVENOUS
  Filled 2017-03-08: qty 10

## 2017-03-09 ENCOUNTER — Telehealth: Payer: Self-pay | Admitting: Cardiology

## 2017-03-09 NOTE — Telephone Encounter (Signed)
New message    Pt is calling about the results of his stress test.

## 2017-03-09 NOTE — Telephone Encounter (Signed)
Returned call to patient.Myoview results given.

## 2017-03-15 ENCOUNTER — Ambulatory Visit (INDEPENDENT_AMBULATORY_CARE_PROVIDER_SITE_OTHER): Payer: Medicare Other | Admitting: Pharmacist Clinician (PhC)/ Clinical Pharmacy Specialist

## 2017-03-15 DIAGNOSIS — Z7901 Long term (current) use of anticoagulants: Secondary | ICD-10-CM

## 2017-03-15 DIAGNOSIS — I4891 Unspecified atrial fibrillation: Secondary | ICD-10-CM | POA: Diagnosis not present

## 2017-03-15 LAB — POCT INR: INR: 1.7

## 2017-03-23 ENCOUNTER — Ambulatory Visit (INDEPENDENT_AMBULATORY_CARE_PROVIDER_SITE_OTHER): Payer: Medicare Other | Admitting: Pharmacist Clinician (PhC)/ Clinical Pharmacy Specialist

## 2017-03-23 DIAGNOSIS — Z7901 Long term (current) use of anticoagulants: Secondary | ICD-10-CM | POA: Diagnosis not present

## 2017-03-23 DIAGNOSIS — I4891 Unspecified atrial fibrillation: Secondary | ICD-10-CM | POA: Diagnosis not present

## 2017-03-23 LAB — POCT INR: INR: 1.8

## 2017-03-27 ENCOUNTER — Other Ambulatory Visit: Payer: Self-pay | Admitting: Pharmacist

## 2017-03-27 ENCOUNTER — Other Ambulatory Visit: Payer: Self-pay | Admitting: Cardiology

## 2017-03-27 MED ORDER — WARFARIN SODIUM 5 MG PO TABS: ORAL_TABLET | ORAL | 0 refills | Status: DC

## 2017-03-28 NOTE — Telephone Encounter (Signed)
Patient to call and clarify warfarin current warfarin regimen before refill authorization.

## 2017-03-29 NOTE — Telephone Encounter (Signed)
LMOM; need clarification of dose prior to send rx to pharmacy

## 2017-04-03 ENCOUNTER — Ambulatory Visit (INDEPENDENT_AMBULATORY_CARE_PROVIDER_SITE_OTHER): Payer: Medicare Other | Admitting: Pharmacist Clinician (PhC)/ Clinical Pharmacy Specialist

## 2017-04-03 DIAGNOSIS — I4891 Unspecified atrial fibrillation: Secondary | ICD-10-CM | POA: Diagnosis not present

## 2017-04-03 DIAGNOSIS — Z7901 Long term (current) use of anticoagulants: Secondary | ICD-10-CM

## 2017-04-03 LAB — POCT INR: INR: 3.1

## 2017-04-17 ENCOUNTER — Ambulatory Visit (INDEPENDENT_AMBULATORY_CARE_PROVIDER_SITE_OTHER): Payer: Medicare Other | Admitting: Pharmacist Clinician (PhC)/ Clinical Pharmacy Specialist

## 2017-04-17 DIAGNOSIS — Z7901 Long term (current) use of anticoagulants: Secondary | ICD-10-CM

## 2017-04-17 DIAGNOSIS — I4891 Unspecified atrial fibrillation: Secondary | ICD-10-CM

## 2017-04-17 LAB — POCT INR: INR: 1.6

## 2017-04-21 ENCOUNTER — Other Ambulatory Visit: Payer: Self-pay | Admitting: Physician Assistant

## 2017-04-24 NOTE — Telephone Encounter (Signed)
Refill Request.  

## 2017-05-01 ENCOUNTER — Ambulatory Visit (INDEPENDENT_AMBULATORY_CARE_PROVIDER_SITE_OTHER): Payer: Medicare Other | Admitting: Pharmacist Clinician (PhC)/ Clinical Pharmacy Specialist

## 2017-05-01 DIAGNOSIS — I4891 Unspecified atrial fibrillation: Secondary | ICD-10-CM

## 2017-05-01 DIAGNOSIS — Z7901 Long term (current) use of anticoagulants: Secondary | ICD-10-CM

## 2017-05-01 LAB — POCT INR: INR: 1.7

## 2017-05-09 ENCOUNTER — Telehealth: Payer: Self-pay | Admitting: Cardiology

## 2017-05-09 NOTE — Telephone Encounter (Signed)
Spoke with pt, aware of follow up appt 05-17-17 with dr Stanford Breed. He will track his bp and pulse and bring it to the appt. He will also call if anything changes.

## 2017-05-09 NOTE — Telephone Encounter (Signed)
New message       Pt c/o BP issue: STAT if pt c/o blurred vision, one-sided weakness or slurred speech  1. What are your last 5 BP readings?  171/88 this am 2. Are you having any other symptoms (ex. Dizziness, headache, blurred vision, passed out)? dizziness 3. What is your BP issue?  Pt states his bp is high.  It is usually in the 120's not 170's.  Pt wanted to see Dr Stanford Breed sooner.  Since no appt is available, he want to talk to the nurse

## 2017-05-09 NOTE — Telephone Encounter (Signed)
Spoke with pt, yesterday he had an episode of dizziness when standing up from using the bathroom. He sat back down and it went away. His bp was 148/77, they did not write down his pulse but thinks it was high and he may have been in atrial fib again. He did not have any other problems during the day yesterday and his bp this morning is 160/89. He is very anxious about the bp being elevated and the new medications he was given at discharge from his surgery in June. The only symptom he has from the atrial fib is the dizziness yesterday, he is unable to tell when he is out of rhythm. He has a follow up with dr Stanford Breed in oct but they want to be seen sooner. Will review schedule and call the patient back.

## 2017-05-09 NOTE — Telephone Encounter (Signed)
Left message for pt to call.

## 2017-05-14 ENCOUNTER — Ambulatory Visit (INDEPENDENT_AMBULATORY_CARE_PROVIDER_SITE_OTHER): Payer: Medicare Other | Admitting: Pharmacist Clinician (PhC)/ Clinical Pharmacy Specialist

## 2017-05-14 DIAGNOSIS — Z7901 Long term (current) use of anticoagulants: Secondary | ICD-10-CM

## 2017-05-14 DIAGNOSIS — I4891 Unspecified atrial fibrillation: Secondary | ICD-10-CM

## 2017-05-14 LAB — POCT INR: INR: 2.6

## 2017-05-15 NOTE — Progress Notes (Signed)
HPI: Follow-up atrial fibrillation. Patient recently found to have atrial fibrillation in June 2018. Echocardiogram showed ejection fraction 45-50%, mild mitral and tricuspid regurgitation. At that time he was found to have acute cholecystitis and underwent cholecystectomy. Patient had successful TEE guided cardioversion on 02/14/2017. Nuclear study July 2018 showed ejection fraction 42%. There was no ischemia. Since last seen, the patient denies any dyspnea on exertion, orthopnea, PND, pedal edema, palpitations, syncope or chest pain.   Current Outpatient Prescriptions  Medication Sig Dispense Refill  . CARTIA XT 120 MG 24 hr capsule TAKE 1 CAPSULE(120 MG) BY MOUTH DAILY 90 capsule 3  . cetirizine (ZYRTEC) 10 MG tablet Take 10 mg by mouth daily.    . fluticasone (FLOVENT HFA) 110 MCG/ACT inhaler Inhale 1 puff into the lungs 2 (two) times daily.     . metoprolol tartrate (LOPRESSOR) 100 MG tablet TAKE 1 TABLET(100 MG) BY MOUTH TWICE DAILY 180 tablet 3  . warfarin (COUMADIN) 5 MG tablet Take 1/2 to 1 tablet by mouth as directed by coumadin clnic (Patient taking differently: TAKE 5 MG ALL DAYS EXCEPT TAKE 7.5 MG ON Tuesday.) 90 tablet 0   No current facility-administered medications for this visit.      Past Medical History:  Diagnosis Date  . Arthritis    right knee oa  . Asthma   . Carpal tunnel syndrome    BILATERAL  . Cholecystitis   . Complication of anesthesia    adverse reaction to the spinal  . Hypertension   . PAF (paroxysmal atrial fibrillation) ([place])   . SBO (small bowel obstruction) ([place]) 12/2015    Past Surgical History:  Procedure Laterality Date  . CARDIOVERSION N/A 02/14/2017   Procedure: CARDIOVERSION;  Surgeon: Josue Hector, MD;  Location[facility] ENDOSCOPY;  Service: Cardiovascular;  Laterality: N/A;  . CHOLECYSTECTOMY  02/09/2017  . CHOLECYSTECTOMY N/A 02/09/2017   Procedure: LAPAROSCOPIC CHOLECYSTECTOMY WITH INTRAOPERATIVE CHOLANGIOGRAM;  Surgeon: Donnie Mesa, MD;  Location: [place];  Service: General;  Laterality: N/A;  . COLONOSCOPY WITH PROPOFOL N/A 11/09/2014   Procedure: COLONOSCOPY WITH PROPOFOL;  Surgeon: Garlan Fair, MD;  Location: [facility] ENDOSCOPY;  Service: Endoscopy;  Laterality: N/A;  . HERNIA REPAIR     2013  . QUADRICEPS TENDON REPAIR Left 06/07/2015   Procedure: REPAIR LEFT QUADRICEP TENDON;  Surgeon: Gaynelle Arabian, MD;  Location: [facility] ORS;  Service: Orthopedics;  Laterality: Left;  . TEE WITHOUT CARDIOVERSION N/A 02/14/2017   Procedure: TRANSESOPHAGEAL ECHOCARDIOGRAM (TEE);  Surgeon: Josue Hector, MD;  Location: [facility] ENDOSCOPY;  Service: Cardiovascular;  Laterality: N/A;  . TOTAL KNEE ARTHROPLASTY Right 12/27/2015   Procedure: RIGHT TOTAL KNEE ARTHROPLASTY;  Surgeon: Gaynelle Arabian, MD;  Location: [facility] ORS;  Service: Orthopedics;  Laterality: Right;    Social History   Social History  . Marital status: Married    Spouse name: N/A  . Number of children: N/A  . Years of education: N/A   Occupational History  . Not on file.   Social History Main Topics  . Smoking status: Never Smoker  . Smokeless tobacco: Never Used  . Alcohol use Yes     Comment:  2 beers daily  . Drug use: No  . Sexual activity: Not on file   Other Topics Concern  . Not on file   Social History Narrative  . No narrative on file    Family History  Problem Relation Age of Onset  . Hypertension Father     ROS: no  fevers or chills, productive cough, hemoptysis, dysphasia, odynophagia, melena, hematochezia, dysuria, hematuria, rash, seizure activity, orthopnea, PND, pedal edema, claudication. Remaining systems are negative.  Physical Exam: Well-developed well-nourished in no acute distress.  Skin is warm and dry.  HEENT is normal.  Neck is supple.  Chest is clear to auscultation with normal expansion.  Cardiovascular exam is regular rate and rhythm.  Abdominal exam nontender or distended. No masses palpated. Extremities show no edema. neuro  grossly intact  ECG- Sinus rhythm at a rate of 60. No ST changes. personally reviewed  A/P  1 Paroxysmal atrial fibrillation-patient remains in sinus rhythm. I will continue metoprolol and Cardizem for rate control if atrial fibrillation recurs. His atrial fibrillation may have been the result of hyperadrenergic state associated with cholecystitis. I will discontinue Coumadin. Instead we will treat with apixaban 5 BID. Began 2 days after discontinuing Coumadin. Check hemoglobin and renal function in 4 weeks.  2 cardiomyopathy-nuclear study showed no ischemia. Question previously tachycardia mediated. Repeat echocardiogram. Continue beta blocker.   3 hypertension-blood pressure is controlled. Continue present medications.  4 Elevated troponin-occurred at the time of his atrial fibrillation. Likely demand ischemia. Nuclear study showed no ischemia or infarction.   Kirk Ruths, MD

## 2017-05-16 ENCOUNTER — Encounter: Payer: Self-pay | Admitting: Cardiology

## 2017-05-17 ENCOUNTER — Ambulatory Visit (INDEPENDENT_AMBULATORY_CARE_PROVIDER_SITE_OTHER): Payer: Medicare Other | Admitting: Cardiology

## 2017-05-17 ENCOUNTER — Encounter: Payer: Self-pay | Admitting: Cardiology

## 2017-05-17 VITALS — BP 124/68 | HR 60 | Ht 70.0 in | Wt 192.0 lb

## 2017-05-17 DIAGNOSIS — I10 Essential (primary) hypertension: Secondary | ICD-10-CM

## 2017-05-17 DIAGNOSIS — I428 Other cardiomyopathies: Secondary | ICD-10-CM | POA: Diagnosis not present

## 2017-05-17 DIAGNOSIS — I480 Paroxysmal atrial fibrillation: Secondary | ICD-10-CM

## 2017-05-17 MED ORDER — APIXABAN 5 MG PO TABS: 5.0000 mg | ORAL_TABLET | Freq: Two times a day (BID) | ORAL | 6 refills | Status: DC

## 2017-05-17 NOTE — Patient Instructions (Signed)
Medication Instructions:   STOP WARFARIN  AFTER BEING OFF WARFARIN FOR 2 DAYS START ELIQUIS 5 MG TWICE DAILY  Labwork:  Your physician recommends that you return for lab work in: 4 WEEKS   Testing/Procedures:  Your physician has requested that you have an echocardiogram. Echocardiography is a painless test that uses sound waves to create images of your heart. It provides your doctor with information about the size and shape of your heart and how well your heart's chambers and valves are working. This procedure takes approximately one hour. There are no restrictions for this procedure.    Follow-Up:  Your physician wants you to follow-up in: [place] will receive a reminder letter in the mail two months in advance. If you don't receive a letter, please call our office to schedule the follow-up appointment.   If you need a refill on your cardiac medications before your next appointment, please call your pharmacy.

## 2017-05-25 ENCOUNTER — Other Ambulatory Visit: Payer: Self-pay

## 2017-05-25 ENCOUNTER — Ambulatory Visit (HOSPITAL_COMMUNITY): Payer: Medicare Other | Attending: Cardiovascular Disease

## 2017-05-25 DIAGNOSIS — I428 Other cardiomyopathies: Secondary | ICD-10-CM

## 2017-05-25 DIAGNOSIS — I10 Essential (primary) hypertension: Secondary | ICD-10-CM | POA: Diagnosis not present

## 2017-05-25 DIAGNOSIS — I4891 Unspecified atrial fibrillation: Secondary | ICD-10-CM | POA: Diagnosis not present

## 2017-05-25 DIAGNOSIS — I361 Nonrheumatic tricuspid (valve) insufficiency: Secondary | ICD-10-CM | POA: Insufficient documentation

## 2017-05-25 DIAGNOSIS — J45909 Unspecified asthma, uncomplicated: Secondary | ICD-10-CM | POA: Insufficient documentation

## 2017-05-28 ENCOUNTER — Other Ambulatory Visit: Payer: Self-pay | Admitting: Physician Assistant

## 2017-05-28 NOTE — Telephone Encounter (Signed)
Please review for refill. Thanks!  

## 2017-06-06 ENCOUNTER — Ambulatory Visit: Payer: Medicare Other | Admitting: Cardiology

## 2017-06-08 ENCOUNTER — Ambulatory Visit: Payer: Medicare Other | Admitting: Cardiology

## 2017-07-19 ENCOUNTER — Encounter: Payer: Self-pay | Admitting: *Deleted

## 2017-08-15 LAB — BASIC METABOLIC PANEL
BUN/Creatinine Ratio: 15 (ref 10–24)
BUN: 12 mg/dL (ref 8–27)
CHLORIDE: 100 mmol/L (ref 96–106)
CO2: 24 mmol/L (ref 20–29)
Calcium: 9.1 mg/dL (ref 8.6–10.2)
Creatinine, Ser: 0.79 mg/dL (ref 0.76–1.27)
GFR calc Af Amer: 96 mL/min/{1.73_m2} (ref >59)
GFR, EST NON AFRICAN AMERICAN: 83 mL/min/{1.73_m2} (ref >59)
GLUCOSE: 70 mg/dL (ref 65–99)
POTASSIUM: 5 mmol/L (ref 3.5–5.2)
SODIUM: 138 mmol/L (ref 134–144)

## 2017-08-15 LAB — CBC
Hematocrit: 40.3 % (ref 37.5–51.0)
Hemoglobin: 13.9 g/dL (ref 13.0–17.7)
MCH: 33.6 pg — ABNORMAL HIGH (ref 26.6–33.0)
MCHC: 34.5 g/dL (ref 31.5–35.7)
MCV: 97 fL (ref 79–97)
PLATELETS: 365 10*3/uL (ref 150–379)
RBC: 4.14 x10E6/uL (ref 4.14–5.80)
RDW: 13.2 % (ref 12.3–15.4)
WBC: 6 10*3/uL (ref 3.4–10.8)

## 2017-11-12 ENCOUNTER — Telehealth: Payer: Self-pay | Admitting: Cardiology

## 2017-11-12 NOTE — Telephone Encounter (Signed)
New Message:     Pt is having cadirac surgery on tom @ 8:30am pt doctor that is performing the procedure would like to know if he should stop taking his apixaban (ELIQUIS) 5 MG TABS tablet.

## 2017-11-12 NOTE — Telephone Encounter (Signed)
Returned the call to the patient. He wanted to know if he needed to hold Eliquis for his cataract surgery tomorrow.   Patient has been made aware that, per pharmd, he does not need to hold Eliquis for cataract surgery.

## 2017-12-09 ENCOUNTER — Other Ambulatory Visit: Payer: Self-pay | Admitting: Cardiology

## 2017-12-14 ENCOUNTER — Ambulatory Visit (HOSPITAL_COMMUNITY)
Admission: RE | Admit: 2017-12-14 | Discharge: 2017-12-14 | Disposition: A | Discharge status: Home / Self Care | Payer: Medicare Other | Source: Ambulatory Visit | Attending: Internal Medicine | Admitting: Internal Medicine

## 2017-12-14 ENCOUNTER — Other Ambulatory Visit (HOSPITAL_COMMUNITY): Payer: Self-pay | Admitting: Internal Medicine

## 2017-12-14 DIAGNOSIS — M7989 Other specified soft tissue disorders: Secondary | ICD-10-CM | POA: Diagnosis present

## 2017-12-14 DIAGNOSIS — R609 Edema, unspecified: Secondary | ICD-10-CM

## 2017-12-14 NOTE — Progress Notes (Signed)
Left lower extremity venous duplex has been completed. Negative for DVT. Results were given to Dr. Laurann Montana.  12/14/17 1:56 PM Daniel Yu RVT

## 2018-01-10 ENCOUNTER — Ambulatory Visit: Payer: Self-pay | Admitting: Pharmacist Clinician (PhC)/ Clinical Pharmacy Specialist

## 2018-01-10 DIAGNOSIS — Z7901 Long term (current) use of anticoagulants: Secondary | ICD-10-CM

## 2018-01-16 NOTE — Progress Notes (Signed)
HPI: Follow-up atrial fibrillation. Patient found to have atrial fibrillation in June 2018. Echocardiogram showed ejection fraction 45-50%, mild mitral and tricuspid regurgitation. At that time he was found to have acute cholecystitis and underwent cholecystectomy. Patient had successful TEE guided cardioversion on 02/14/2017. Nuclear study July 2018 showed ejection fraction 42%. There was no ischemia.  Echocardiogram repeated October 2018 and showed normal LV function, severe left atrial enlargement and mild tricuspid regurgitation.  Since last seen, the patient denies any dyspnea on exertion, orthopnea, PND, pedal edema, palpitations, syncope or chest pain.   Current Outpatient Medications  Medication Sig Dispense Refill  . CARTIA XT 120 MG 24 hr capsule TAKE 1 CAPSULE(120 MG) BY MOUTH DAILY 90 capsule 3  . CARTIA XT 120 MG 24 hr capsule TAKE 1 CAPSULE(120 MG) BY MOUTH DAILY 90 capsule 3  . cetirizine (ZYRTEC) 10 MG tablet Take 10 mg by mouth daily.    Marland Kitchen ELIQUIS 5 MG TABS tablet TAKE 1 TABLET(5 MG) BY MOUTH TWICE DAILY 60 tablet 1  . fluticasone (FLOVENT HFA) 110 MCG/ACT inhaler Inhale 1 puff into the lungs 2 (two) times daily.     . metoprolol tartrate (LOPRESSOR) 100 MG tablet TAKE 1 TABLET(100 MG) BY MOUTH TWICE DAILY 180 tablet 3  . metoprolol tartrate (LOPRESSOR) 100 MG tablet TAKE 1 TABLET(100 MG) BY MOUTH TWICE DAILY 180 tablet 3   No current facility-administered medications for this visit.      Past Medical History:  Diagnosis Date  . Arthritis    right knee oa  . Asthma   . Carpal tunnel syndrome    BILATERAL  . Cholecystitis   . Complication of anesthesia    adverse reaction to the spinal  . Hypertension   . PAF (paroxysmal atrial fibrillation) ([place])   . SBO (small bowel obstruction) ([place]) 12/2015    Past Surgical History:  Procedure Laterality Date  . CARDIOVERSION N/A 02/14/2017   Procedure: CARDIOVERSION;  Surgeon: Josue Hector, MD;  Location: [facility]  ENDOSCOPY;  Service: Cardiovascular;  Laterality: N/A;  . CHOLECYSTECTOMY  02/09/2017  . CHOLECYSTECTOMY N/A 02/09/2017   Procedure: LAPAROSCOPIC CHOLECYSTECTOMY WITH INTRAOPERATIVE CHOLANGIOGRAM;  Surgeon: Donnie Mesa, MD;  Location: [facility];  Service: General;  Laterality: N/A;  . COLONOSCOPY WITH PROPOFOL N/A 11/09/2014   Procedure: COLONOSCOPY WITH PROPOFOL;  Surgeon: Garlan Fair, MD;  Location: [facility] ENDOSCOPY;  Service: Endoscopy;  Laterality: N/A;  . HERNIA REPAIR     2013  . QUADRICEPS TENDON REPAIR Left 06/07/2015   Procedure: REPAIR LEFT QUADRICEP TENDON;  Surgeon: Gaynelle Arabian, MD;  Location: [facility];  Service: Orthopedics;  Laterality: Left;  . TEE WITHOUT CARDIOVERSION N/A 02/14/2017   Procedure: TRANSESOPHAGEAL ECHOCARDIOGRAM (TEE);  Surgeon: Josue Hector, MD;  Location: [facility] ENDOSCOPY;  Service: Cardiovascular;  Laterality: N/A;  . TOTAL KNEE ARTHROPLASTY Right 12/27/2015   Procedure: RIGHT TOTAL KNEE ARTHROPLASTY;  Surgeon: Gaynelle Arabian, MD;  Location: [facility];  Service: Orthopedics;  Laterality: Right;    Social History   Socioeconomic History  . Marital status: Married    Spouse name: Not on file  . Number of children: Not on file  . Years of education: Not on file  . Highest education level: Not on file  Occupational History  . Not on file  Social Needs  . Financial resource strain: Not on file  . Food insecurity:    Worry: Not on file    Inability: Not on file  . Transportation needs:  Medical: Not on file    Non-medical: Not on file  Tobacco Use  . Smoking status: Never Smoker  . Smokeless tobacco: Never Used  Substance and Sexual Activity  . Alcohol use: Yes    Comment:  2 beers daily  . Drug use: No  . Sexual activity: Not on file  Lifestyle  . Physical activity:    Days per week: Not on file    Minutes per session: Not on file  . Stress: Not on file  Relationships  . Social connections:    Talks on phone: Not on file    Gets together: Not on  file    Attends religious service: Not on file    Active member of club or organization: Not on file    Attends meetings of clubs or organizations: Not on file    Relationship status: Not on file  . Intimate partner violence:    Fear of current or ex partner: Not on file    Emotionally abused: Not on file    Physically abused: Not on file    Forced sexual activity: Not on file  Other Topics Concern  . Not on file  Social History Narrative  . Not on file    Family History  Problem Relation Age of Onset  . Hypertension Father     ROS: no fevers or chills, productive cough, hemoptysis, dysphasia, odynophagia, melena, hematochezia, dysuria, hematuria, rash, seizure activity, orthopnea, PND, pedal edema, claudication. Remaining systems are negative.  Physical Exam: Well-developed well-nourished in no acute distress.  Skin is warm and dry.  HEENT is normal.  Neck is supple.  Chest is clear to auscultation with normal expansion.  Cardiovascular exam is regular rate and rhythm.  Abdominal exam nontender or distended. No masses palpated. Extremities show no edema. neuro grossly intact   A/P  1 paroxysmal atrial fibrillation-patient remains in sinus rhythm today.  Continue present dose of calcium blocker and beta-blocker for rate control if atrial fibrillation recurs.  Continue apixaban.  Check hemoglobin and renal function.  2 cardiomyopathy-previous nuclear study showed no ischemia.  Follow-up echocardiogram showed LV function had normalized.  Continue beta-blocker.  3 hypertension-blood pressure is borderline.  I have asked him to follow this at home and we will add medications as needed.  4 previous elevated troponin-noted at time of his episode of atrial fibrillation when hospitalized.  This is felt to have been related to demand ischemia.  Nuclear study showed no ischemia or infarction.  No plans for further evaluation.  Kirk Ruths, MD

## 2018-01-17 ENCOUNTER — Encounter: Payer: Self-pay | Admitting: Cardiology

## 2018-01-17 ENCOUNTER — Ambulatory Visit: Payer: Medicare Other | Admitting: Cardiology

## 2018-01-17 VITALS — BP 142/84 | HR 64 | Ht 70.0 in | Wt 201.0 lb

## 2018-01-17 DIAGNOSIS — I428 Other cardiomyopathies: Secondary | ICD-10-CM | POA: Diagnosis not present

## 2018-01-17 DIAGNOSIS — I10 Essential (primary) hypertension: Secondary | ICD-10-CM | POA: Diagnosis not present

## 2018-01-17 DIAGNOSIS — I480 Paroxysmal atrial fibrillation: Secondary | ICD-10-CM

## 2018-01-17 NOTE — Patient Instructions (Signed)
Medication Instructions:   NO CHANGE  Labwork:  Your physician recommends that you HAVE LAB WORK TODAY  Follow-Up:  Your physician wants you to follow-up in: ONE YEAR WITH DR CRENSHAW You will receive a reminder letter in the mail two months in advance. If you don't receive a letter, please call our office to schedule the follow-up appointment.   If you need a refill on your cardiac medications before your next appointment, please call your pharmacy.    

## 2018-01-18 LAB — CBC
HEMOGLOBIN: 13.7 g/dL (ref 13.0–17.7)
Hematocrit: 40.6 % (ref 37.5–51.0)
MCH: 32.5 pg (ref 26.6–33.0)
MCHC: 33.7 g/dL (ref 31.5–35.7)
MCV: 96 fL (ref 79–97)
PLATELETS: 223 10*3/uL (ref 150–450)
RBC: 4.22 x10E6/uL (ref 4.14–5.80)
RDW: 13.6 % (ref 12.3–15.4)
WBC: 5 10*3/uL (ref 3.4–10.8)

## 2018-02-07 ENCOUNTER — Other Ambulatory Visit: Payer: Self-pay | Admitting: Cardiology

## 2018-04-08 ENCOUNTER — Other Ambulatory Visit: Payer: Self-pay | Admitting: Cardiology

## 2018-04-08 NOTE — Telephone Encounter (Signed)
Ok to refill Eliquis  Peter Martinique MD, [facility], [place]

## 2018-06-05 IMAGING — US US ABDOMEN LIMITED
1 series · 14 of 25 positions shown · non-contrast
Comparison: 02/08/2017 CT

CLINICAL DATA: 84-year-old male with right upper quadrant abdominal
pain for 2 days.

EXAM:
ULTRASOUND ABDOMEN LIMITED RIGHT UPPER QUADRANT

[Series 1: us abdomen limited · 0.25mm/px · 14 of 61 slices shown]
[im 1/61]
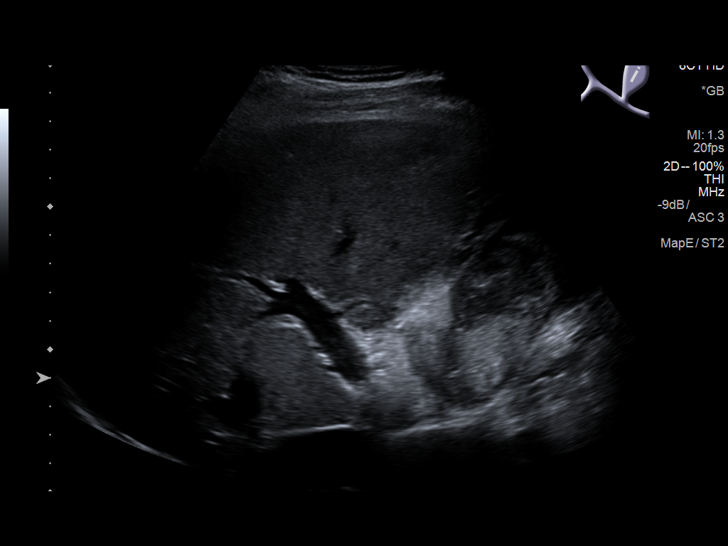
[im 6/61]
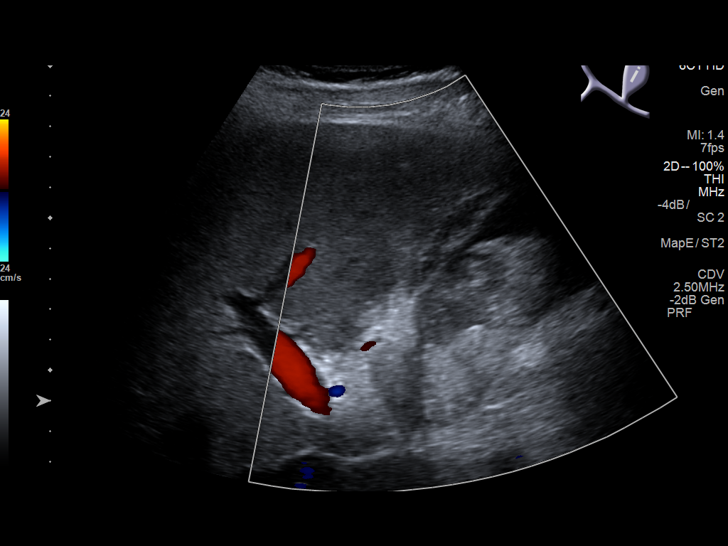
[im 11/61]
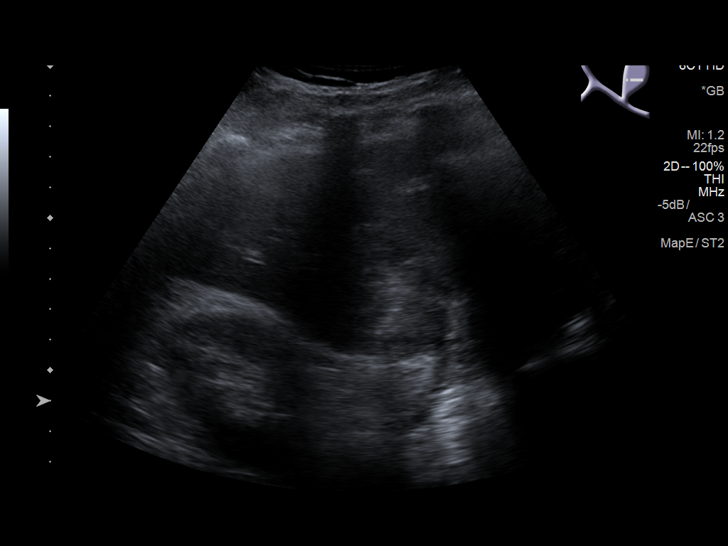
[im 16/61]
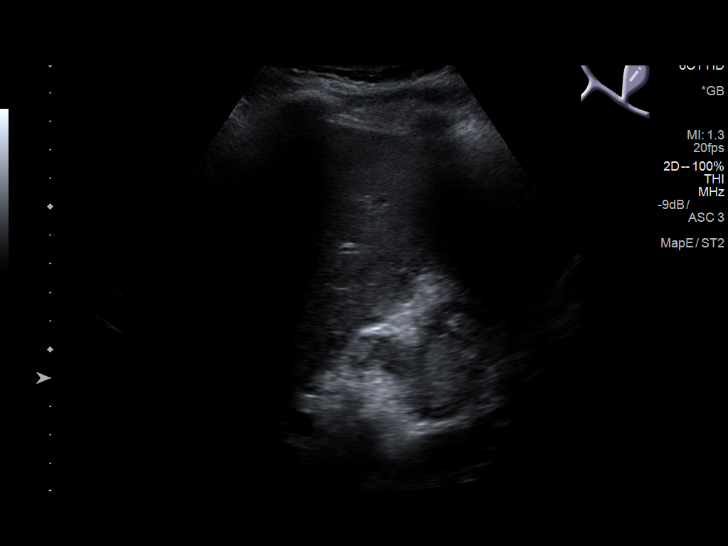
[im 21/61]
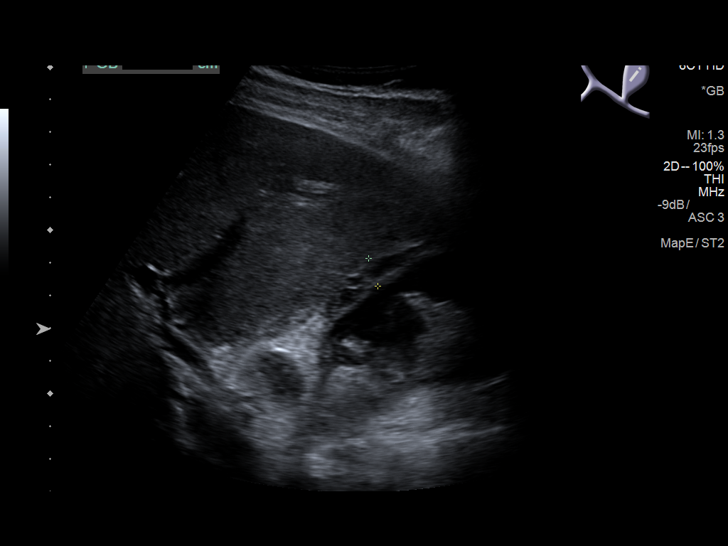
[im 23/61]
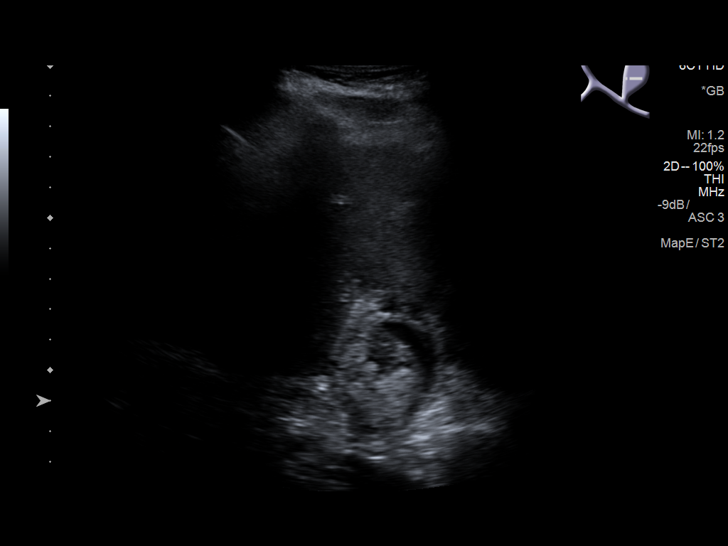
[im 28/61]
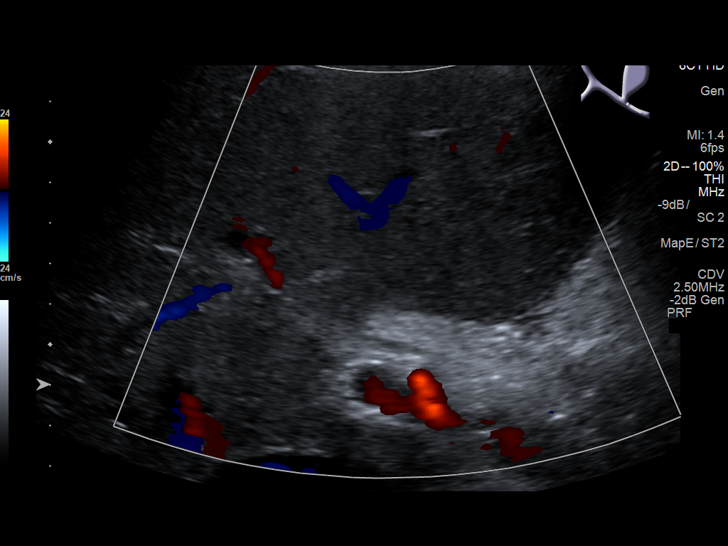
[im 33/61]
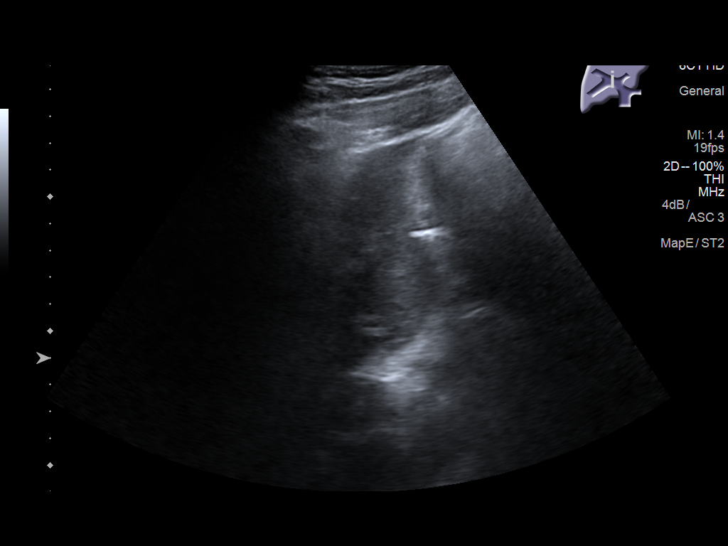
[im 38/61]
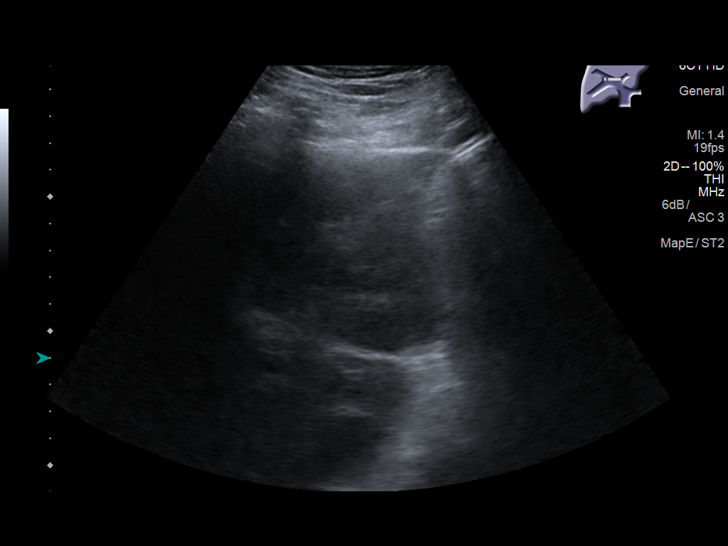
[im 41/61]
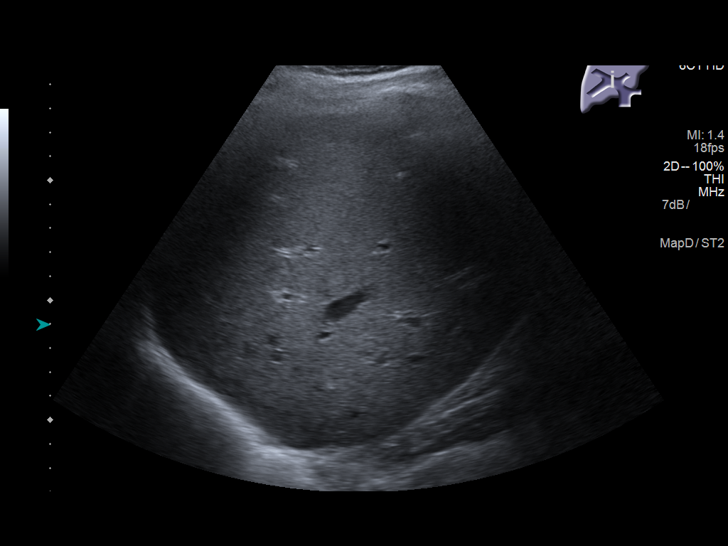
[im 46/61]
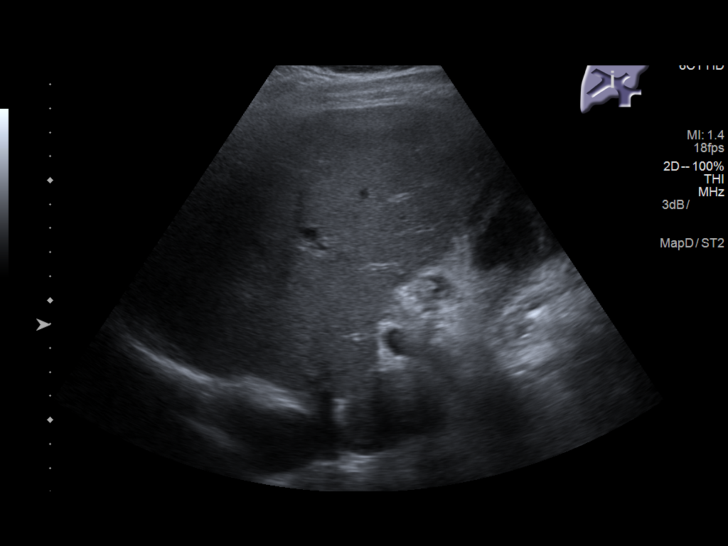
[im 51/61]
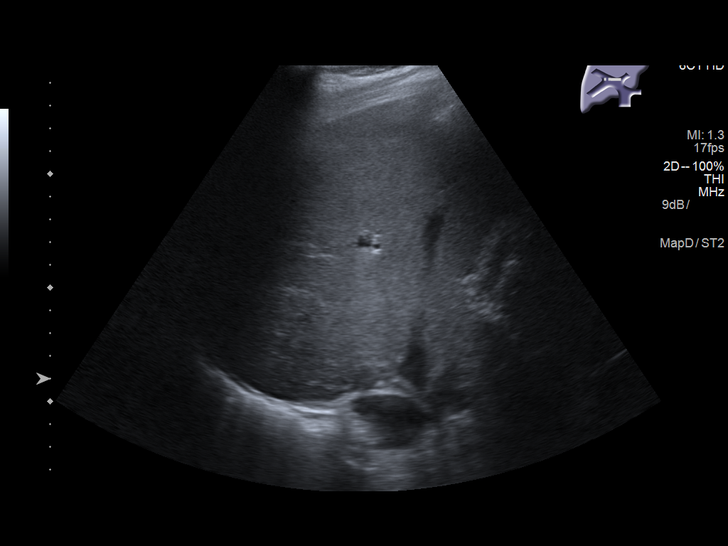
[im 56/61]
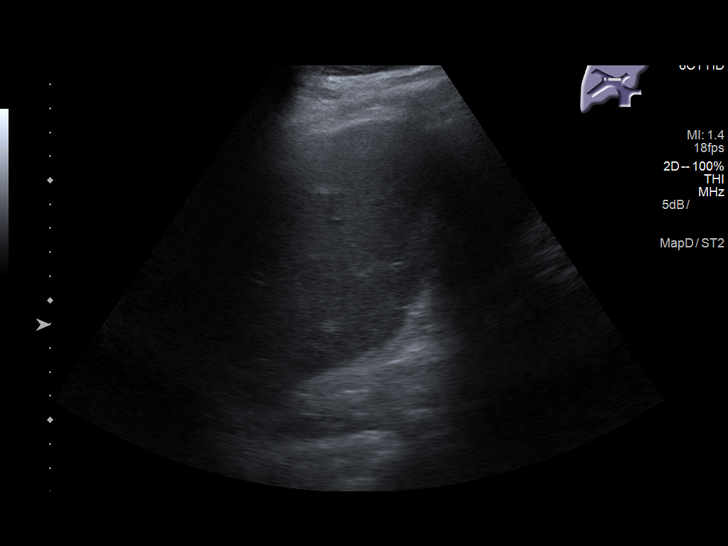
[im 61/61]
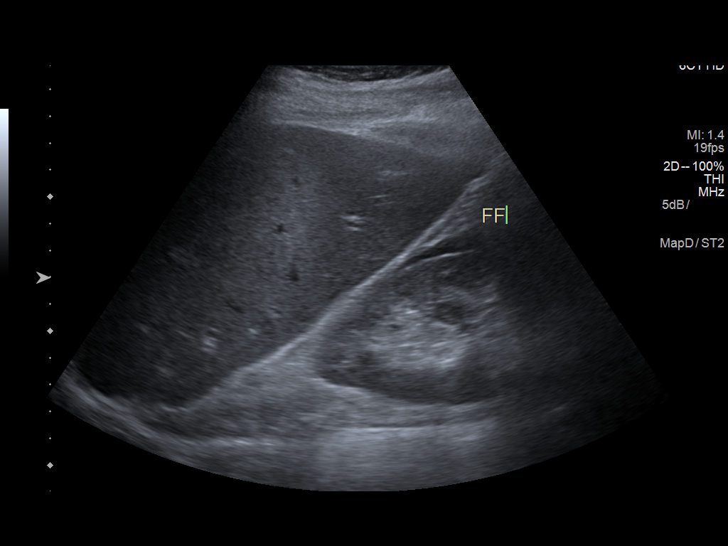

[14 of 25 positions shown; findings below may reference images not displayed]

FINDINGS: Gallbladder:

A large amount of gallbladder sludge noted. Gallbladder wall
thickening and pericholecystic fluid noted. No definite
cholelithiasis or sonographic Murphy's sign.

Common bile duct:

Diameter: 3 mm. There is no evidence of intrahepatic or extrahepatic
biliary dilatation.

Liver:

No focal lesion identified. Within normal limits in parenchymal
echogenicity.

A small amount of free fluid in Morison's pouch noted.
IMPRESSION: Gallbladder wall thickening with pericholecystic fluid and large
amount of gallbladder sludge suspicious for acute cholecystitis in
conjunction with CT findings. No biliary dilatation.

## 2018-06-07 ENCOUNTER — Other Ambulatory Visit: Payer: Self-pay | Admitting: Cardiology

## 2018-09-05 ENCOUNTER — Other Ambulatory Visit: Payer: Self-pay | Admitting: Cardiology

## 2018-09-06 NOTE — Telephone Encounter (Signed)
Pt is 85 yr old male who saw Dr. Stanford Breed on 01/17/18, wt was 91.2Kg. Last noted SCr was 0.79 on 05/13/18 with [facility]. Will refill Eliquis 5mg  BID.

## 2018-12-04 ENCOUNTER — Other Ambulatory Visit: Payer: Self-pay | Admitting: Cardiology

## 2018-12-04 NOTE — Telephone Encounter (Signed)
Lopressor 100 mg refilled.

## 2018-12-25 ENCOUNTER — Telehealth: Payer: Self-pay | Admitting: *Deleted

## 2018-12-25 NOTE — Telephone Encounter (Signed)
12/25/18 LMOM @ 11:57 am,re: follow up appointment.

## 2019-01-21 ENCOUNTER — Telehealth: Payer: Self-pay | Admitting: Cardiology

## 2019-01-21 NOTE — Telephone Encounter (Signed)
Pt c/o BP issue: STAT if pt c/o blurred vision, one-sided weakness or slurred speech  1. What are your last 5 BP readings? 167/93 This morning      140/80 May 28th      135/77 May 27th      125/80 May 14th      137/86 April 24th   2. Are you having any other symptoms (ex. Dizziness, headache, blurred vision, passed out)? Lightheaded, was feeling this way yesterday afternoon as well.   3. What is your BP issue? Feeling lightheaded.

## 2019-01-21 NOTE — Telephone Encounter (Signed)
Called and spoke with patient's wife- advised of change to medication for today- and to monitor BP and HR.  Made appointment with Dr.Crenshaw as suggested.

## 2019-01-21 NOTE — Telephone Encounter (Signed)
Contacted patient, he gave verbal to speak with wife- she states that patients BP has been running fairly well over the last few months, but yesterday and today his BP has increased and that patient is having issues with being lightheaded and feeling unsteady.  She states he took it this morning it was 167/92- they waited and took it again and it was 159/89. Normally it is around 130-140 his top number never gets above 140. They are unable to recall the HR at this time. Patient denies CP, SOB, weight gain, swelling, changes in diet or medications at this time. He is on Metoprolol 100 mg twice daily, and did take one of those tablets this morning already, and does not feel any better. I advised patient to monitor the BP- try not to take it very often as that can make the BP increase as well, stay hydrated, and I would route to MD/PharmD for any other recommendations at this time.  Patient wife verbalized understanding.

## 2019-01-21 NOTE — Telephone Encounter (Signed)
Noted patient on diltiazem 120mg  daily plus metoprolol 100mg  daily   Recommendation:  1. Take an extra dose of diltiazem 120mg  today ONLY (okay to take now)  2. Please monitor BP and HR twice daily for next 3 days and call back  if BP remains elevated.   (if pulse is under 50 and we increase dose of current medication, his dizziness will get worst).  3. Schedule f/u appointment with DR Stanford Breed  (ovedue for 81yr follow up)

## 2019-02-22 ENCOUNTER — Other Ambulatory Visit: Payer: Self-pay

## 2019-02-22 DIAGNOSIS — S0101XA Laceration without foreign body of scalp, initial encounter: Secondary | ICD-10-CM | POA: Diagnosis not present

## 2019-02-22 DIAGNOSIS — J45909 Unspecified asthma, uncomplicated: Secondary | ICD-10-CM | POA: Insufficient documentation

## 2019-02-22 DIAGNOSIS — Z79899 Other long term (current) drug therapy: Secondary | ICD-10-CM | POA: Insufficient documentation

## 2019-02-22 DIAGNOSIS — I10 Essential (primary) hypertension: Secondary | ICD-10-CM | POA: Insufficient documentation

## 2019-02-22 DIAGNOSIS — W109XXA Fall (on) (from) unspecified stairs and steps, initial encounter: Secondary | ICD-10-CM | POA: Insufficient documentation

## 2019-02-22 DIAGNOSIS — Y939 Activity, unspecified: Secondary | ICD-10-CM | POA: Insufficient documentation

## 2019-02-22 DIAGNOSIS — Z7901 Long term (current) use of anticoagulants: Secondary | ICD-10-CM | POA: Diagnosis not present

## 2019-02-22 DIAGNOSIS — Y999 Unspecified external cause status: Secondary | ICD-10-CM | POA: Insufficient documentation

## 2019-02-22 DIAGNOSIS — Z96651 Presence of right artificial knee joint: Secondary | ICD-10-CM | POA: Insufficient documentation

## 2019-02-22 DIAGNOSIS — Y92009 Unspecified place in unspecified non-institutional (private) residence as the place of occurrence of the external cause: Secondary | ICD-10-CM | POA: Diagnosis not present

## 2019-02-23 ENCOUNTER — Emergency Department (HOSPITAL_COMMUNITY): Payer: Medicare Other

## 2019-02-23 ENCOUNTER — Other Ambulatory Visit: Payer: Self-pay

## 2019-02-23 ENCOUNTER — Emergency Department (HOSPITAL_COMMUNITY)
Admission: EM | Admit: 2019-02-23 | Discharge: 2019-02-23 | Disposition: A | Discharge status: Home / Self Care | Payer: Medicare Other | Attending: Emergency Medicine | Admitting: Emergency Medicine

## 2019-02-23 ENCOUNTER — Other Ambulatory Visit: Payer: Self-pay | Admitting: Cardiology

## 2019-02-23 ENCOUNTER — Encounter (HOSPITAL_COMMUNITY): Payer: Self-pay | Admitting: *Deleted

## 2019-02-23 DIAGNOSIS — D689 Coagulation defect, unspecified: Secondary | ICD-10-CM

## 2019-02-23 DIAGNOSIS — S0101XA Laceration without foreign body of scalp, initial encounter: Secondary | ICD-10-CM

## 2019-02-23 DIAGNOSIS — W19XXXA Unspecified fall, initial encounter: Secondary | ICD-10-CM

## 2019-02-23 MED ORDER — LIDOCAINE-EPINEPHRINE 2 %-1:200000 IJ SOLN
20.0000 mL | Freq: Once | INTRAMUSCULAR | Status: AC
Start: 2019-02-23 — End: 2019-02-23
  Administered 2019-02-23: 20 mL

## 2019-02-23 MED ORDER — LIDOCAINE-EPINEPHRINE (PF) 2 %-1:200000 IJ SOLN
INTRAMUSCULAR | Status: AC
  Filled 2019-02-23: qty 20

## 2019-02-23 NOTE — ED Provider Notes (Signed)
[facility] EMERGENCY DEPARTMENT Provider Note   CSN: [id number] Arrival date & time: 02/22/19  2350     History   Chief Complaint Chief Complaint  Patient presents with   Laceration    HPI Daniel Yu is a 85 y.o. male.     Patient to ED for evaluation of head injury after a fall earlier this evening. He was walking up a set of 4 steps into his house and his wife was coming down, and when then collided, he fell backward from the 2nd step onto concrete, hitting his head. No LOC, nausea, vomiting. No headache now. He sustained a small laceration to the back of his head. No neck pain. He is on Eliquis.  Tetanus status is UTD.   The history is provided by the patient. No language interpreter was used.    Past Medical History:  Diagnosis Date   Arthritis    right knee oa   Asthma    Carpal tunnel syndrome    BILATERAL   Cholecystitis    Complication of anesthesia    adverse reaction to the spinal   Hypertension    PAF (paroxysmal atrial fibrillation) ([facility])    SBO (small bowel obstruction) ([place]) 12/2015    Patient Active Problem List   Diagnosis Date Noted   Acute cholecystitis 02/15/2017   Atrial fibrillation, new onset ([employer]) 02/15/2017   Troponin level elevated 02/15/2017   Sepsis due to Escherichia coli (E. coli) ([facility])    Acute systolic CHF (congestive heart failure) ([place])    New onset atrial fibrillation ([place])    Bacteremia due to Escherichia coli    Hypokalemia    Sepsis ([place]) 02/08/2017   Abnormal abdominal CT scan 02/08/2017   Asthma    Arthritis    SBO (small bowel obstruction) ([place]) 01/03/2016   Dehydration with hyponatremia 01/03/2016   S/P right knee surgery/TKA  01/03/2016   Acute hypokalemia 01/03/2016   HTN (hypertension) 01/03/2016   Acute hyperglycemia 01/03/2016   OA (osteoarthritis) of knee 12/27/2015   Rupture of left quadriceps tendon 06/07/2015   Quadriceps tendon rupture 06/07/2015     Past Surgical History:  Procedure Laterality Date   CARDIOVERSION N/A 02/14/2017   Procedure: CARDIOVERSION;  Surgeon: Josue Hector, MD;  Location: [place];  Service: Cardiovascular;  Laterality: N/A;   CHOLECYSTECTOMY  02/09/2017   CHOLECYSTECTOMY N/A 02/09/2017   Procedure: LAPAROSCOPIC CHOLECYSTECTOMY WITH INTRAOPERATIVE CHOLANGIOGRAM;  Surgeon: Donnie Mesa, MD;  Location: [place];  Service: General;  Laterality: N/A;   COLONOSCOPY WITH PROPOFOL N/A 11/09/2014   Procedure: COLONOSCOPY WITH PROPOFOL;  Surgeon: Garlan Fair, MD;  [facility];  Service: Endoscopy;  Laterality: N/A;   HERNIA REPAIR     2013   QUADRICEPS TENDON REPAIR Left 06/07/2015   Procedure: REPAIR LEFT QUADRICEP TENDON;  Surgeon: Gaynelle Arabian, MD;  Location: [facility];  Service: Orthopedics;  Laterality: Left;   TEE WITHOUT CARDIOVERSION N/A 02/14/2017   Procedure: TRANSESOPHAGEAL ECHOCARDIOGRAM (TEE);  Surgeon: Josue Hector, MD;  Location: [facility] ENDOSCOPY;  Service: Cardiovascular;  Laterality: N/A;   TOTAL KNEE ARTHROPLASTY Right 12/27/2015   Procedure: RIGHT TOTAL KNEE ARTHROPLASTY;  Surgeon: Gaynelle Arabian, MD;  Location: [facility];  Service: Orthopedics;  Laterality: Right;        Home Medications    Prior to Admission medications   Medication Sig Start Date End Date Taking? Authorizing Provider  CARTIA XT 120 MG 24 hr capsule TAKE 1 CAPSULE(120 MG) BY MOUTH DAILY 04/24/17  Lelon Perla, MD  cetirizine (ZYRTEC) 10 MG tablet Take 10 mg by mouth daily.    [provider]  diltiazem (CARDIZEM CD) 120 MG 24 hr capsule TAKE ONE CAPSULE BY MOUTH DAILY 06/07/18   Crenshaw, Denice Bors, MD  ELIQUIS 5 MG TABS tablet TAKE 1 TABLET(5 MG) BY MOUTH TWICE DAILY 09/06/18   Lelon Perla, MD  fluticasone (FLOVENT HFA) 110 MCG/ACT inhaler Inhale 1 puff into the lungs 2 (two) times daily.     [provider]  metoprolol tartrate (LOPRESSOR) 100 MG tablet TAKE 1 TABLET BY MOUTH  TWICE DAILY 12/04/18   Lelon Perla, MD    Family History Family History  Problem Relation Age of Onset   Hypertension Father     Social History Social History   Tobacco Use   Smoking status: Never Smoker   Smokeless tobacco: Never Used  Substance Use Topics   Alcohol use: Yes    Comment:  2 beers daily   Drug use: No     Allergies   Patient has no known allergies.   Review of Systems Review of Systems  Gastrointestinal: Negative for nausea.  Musculoskeletal: Negative for neck pain.  Skin: Positive for wound.  Neurological: Negative for syncope and headaches.     Physical Exam Updated Vital Signs BP 137/76 (BP Location: Right Arm)    Pulse 67    Temp (!) 97.4 F (36.3 C) (Oral)    Resp 16    SpO2 97%   Physical Exam Constitutional:      Appearance: He is well-developed.  HENT:     Head: Normocephalic.     Nose: Nose normal.  Eyes:     Pupils: Pupils are equal, round, and reactive to light.  Neck:     Musculoskeletal: Normal range of motion and neck supple.  Pulmonary:     Effort: Pulmonary effort is normal.  Chest:     Chest wall: No tenderness.  Abdominal:     Tenderness: There is no abdominal tenderness.  Musculoskeletal: Normal range of motion.     Comments: No midline cervical tenderness or step-off. Full, pain free ROM. Moves all 4 extremities without limitation or pain.   Skin:    General: Skin is warm and dry.     Comments: 2.5 cm lac to occipital scalp. No active bleeding.   Neurological:     General: No focal deficit present.     Mental Status: He is alert and oriented to person, place, and time.     Sensory: No sensory deficit.     Coordination: Coordination normal.     Gait: Gait normal.      ED Treatments / Results  Labs (all labs ordered are listed, but only abnormal results are displayed) Labs Reviewed - No data to display  EKG None  Radiology Ct Head Wo Contrast  Result Date: 02/23/2019 CLINICAL DATA:  Fall with  laceration to the posterior head EXAM: CT HEAD WITHOUT CONTRAST CT CERVICAL SPINE WITHOUT CONTRAST TECHNIQUE: Multidetector CT imaging of the head and cervical spine was performed following the standard protocol without intravenous contrast. Multiplanar CT image reconstructions of the cervical spine were also generated. COMPARISON:  None. FINDINGS: CT HEAD FINDINGS Brain: No acute territorial infarction, hemorrhage, or intracranial mass. Moderate atrophy. Mild small vessel ischemic changes of the white matter. Prominent ventricles felt secondary to atrophy Vascular: No hyperdense vessels.  Carotid vascular calcification Skull: Normal. Negative for fracture or focal lesion. Sinuses/Orbits: Opacified left sphenoid sinus.  Moderate mucosal thickening in the ethmoid and maxillary sinuses. Bony wall thickening of the sinuses consistent with chronic sinusitis. Other: Small left posterior scalp laceration CT CERVICAL SPINE FINDINGS Alignment: 4 mm anterolisthesis C3 on C4, 3 mm anterolisthesis C4 on C5, 3 mm anterolisthesis C6 on C7 and 2 mm anterolisthesis C7 on T1. Facet alignment is maintained. Skull base and vertebrae: No acute fracture. No primary bone lesion or focal pathologic process. Soft tissues and spinal canal: No prevertebral fluid or swelling. No visible canal hematoma. Disc levels: Moderate to marked degenerative change at C5-C6 and C6-C7 with mild degenerative change at C3-C4. Multiple level bilateral facet degenerative change with multiple bilateral level foraminal stenosis Upper chest: Negative. Other: None IMPRESSION: 1. No CT evidence for acute intracranial abnormality. Atrophy and small vessel ischemic changes of the white matter 2. Mild anterolisthesis at multiple levels, suspected to be secondary to degenerative changes. No definite fracture is seen. 3. Findings consistent with chronic sinusitis Electronically Signed   By: Donavan Foil M.D.   On: 02/23/2019 01:37   Ct Cervical Spine Wo  Contrast  Result Date: 02/23/2019 CLINICAL DATA:  Fall with laceration to the posterior head EXAM: CT HEAD WITHOUT CONTRAST CT CERVICAL SPINE WITHOUT CONTRAST TECHNIQUE: Multidetector CT imaging of the head and cervical spine was performed following the standard protocol without intravenous contrast. Multiplanar CT image reconstructions of the cervical spine were also generated. COMPARISON:  None. FINDINGS: CT HEAD FINDINGS Brain: No acute territorial infarction, hemorrhage, or intracranial mass. Moderate atrophy. Mild small vessel ischemic changes of the white matter. Prominent ventricles felt secondary to atrophy Vascular: No hyperdense vessels.  Carotid vascular calcification Skull: Normal. Negative for fracture or focal lesion. Sinuses/Orbits: Opacified left sphenoid sinus. Moderate mucosal thickening in the ethmoid and maxillary sinuses. Bony wall thickening of the sinuses consistent with chronic sinusitis. Other: Small left posterior scalp laceration CT CERVICAL SPINE FINDINGS Alignment: 4 mm anterolisthesis C3 on C4, 3 mm anterolisthesis C4 on C5, 3 mm anterolisthesis C6 on C7 and 2 mm anterolisthesis C7 on T1. Facet alignment is maintained. Skull base and vertebrae: No acute fracture. No primary bone lesion or focal pathologic process. Soft tissues and spinal canal: No prevertebral fluid or swelling. No visible canal hematoma. Disc levels: Moderate to marked degenerative change at C5-C6 and C6-C7 with mild degenerative change at C3-C4. Multiple level bilateral facet degenerative change with multiple bilateral level foraminal stenosis Upper chest: Negative. Other: None IMPRESSION: 1. No CT evidence for acute intracranial abnormality. Atrophy and small vessel ischemic changes of the white matter 2. Mild anterolisthesis at multiple levels, suspected to be secondary to degenerative changes. No definite fracture is seen. 3. Findings consistent with chronic sinusitis Electronically Signed   By: Donavan Foil M.D.    On: 02/23/2019 01:37    Procedures .Marland KitchenLaceration Repair  Date/Time: 02/23/2019 6:44 AM Performed by: Charlann Lange, PA-C Authorized by: Charlann Lange, PA-C   Consent:    Consent obtained:  Verbal   Consent given by:  Patient Anesthesia (see MAR for exact dosages):    Anesthesia method:  Local infiltration   Local anesthetic:  Lidocaine 2% WITH epi Laceration details:    Location:  Scalp   Scalp location:  Occipital   Length (cm):  2.5 Repair type:    Repair type:  Simple Exploration:    Hemostasis achieved with:  Direct pressure   Wound extent: no foreign bodies/material noted   Treatment:    Area cleansed with:  Betadine Skin repair:    Repair  method:  Staples   Number of staples:  3 Approximation:    Approximation:  Close Post-procedure details:    Dressing:  Open (no dressing)   Patient tolerance of procedure:  Tolerated well, no immediate complications   (including critical care time)  Medications Ordered in ED Medications  lidocaine-EPINEPHrine (XYLOCAINE W/EPI) 2 %-1:200000 (PF) injection 20 mL (has no administration in time range)  lidocaine-EPINEPHrine (XYLOCAINE W/EPI) 2 %-1:200000 (PF) injection (has no administration in time range)     Initial Impression / Assessment and Plan / ED Course  I have reviewed the triage vital signs and the nursing notes.  Pertinent labs & imaging results that were available during my care of the patient were reviewed by me and considered in my medical decision making (see chart for details).    Patient to Ed after falling backward, hitting his head and causing laceration to scalp. No LOC.  He is on Elqiuis so Head and Neck CT ordered. On review these are found to be negative.   Laceration repaired as per above note. He is seen by Dr. Roxanne Mins during evaluation.     He is appropriate for discharge home.   Final Clinical Impressions(s) / ED Diagnoses   Final diagnoses:  None   1. Fall 2. Scalp laceration  ED  Discharge Orders    None       Charlann Lange, Hershal Coria 85/27/78 [id number]    Delora Fuel, MD 53/61/44 [phone]

## 2019-02-23 NOTE — ED Triage Notes (Signed)
Pt was carrying a wine glass and a plate into the house and collided with his wife. Pt lost his footing and fell backward onto the ground; small lac noted to L posterior head; denies LOC. Is on Eliquis

## 2019-02-23 NOTE — Discharge Instructions (Addendum)
Follow up with Dr. Laurann Montana for staple removal in 10 days. Go sooner with any sign of infection - fever, increasing pain or redness, drainage from the wound.   Return to the Emergency department with any new or concerning symptoms.

## 2019-02-24 NOTE — Telephone Encounter (Signed)
Rx(s) sent to pharmacy electronically.  

## 2019-03-13 ENCOUNTER — Other Ambulatory Visit: Payer: Self-pay | Admitting: Cardiology

## 2019-03-20 IMAGING — CT CT ABD-PELV W/ CM
2 of 5 series · 15 of 46 positions shown, 17 images · IV contrast (Omni 300)
Comparison: Radiographs earlier this day.  CT 01/03/2016

CLINICAL DATA: Diffuse abdominal cramping.  Sepsis.

EXAM:
CT ABDOMEN AND PELVIS WITH CONTRAST
TECHNIQUE: Multidetector CT imaging of the abdomen and pelvis was performed
using the standard protocol following bolus administration of
intravenous contrast.
CONTRAST:  100mL [id number] IOPAMIDOL ([id number]) INJECTION 61%

[Series 3: a/p w/ 5mm · axial · 0.89mm/px · z∈[+821,+1256]mm · 12 of 101 slices shown, 14 images]
[im 7/101  soft-tissue]
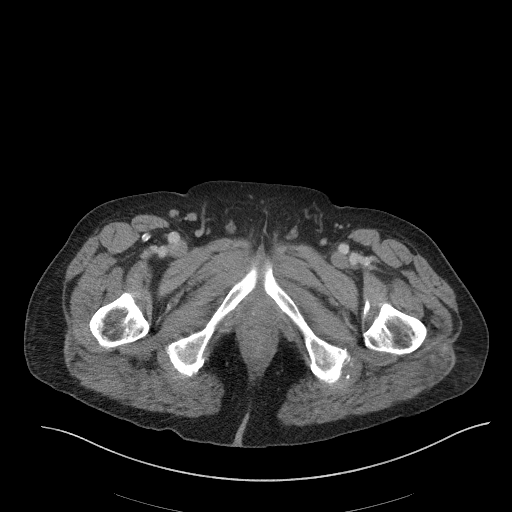
[im 7/101  bone]
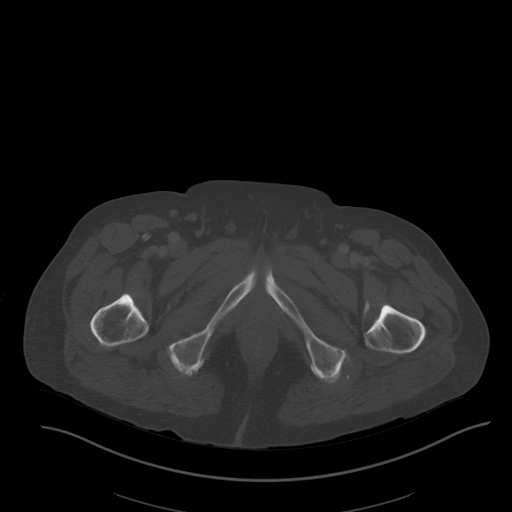
[im 13/101  soft-tissue]
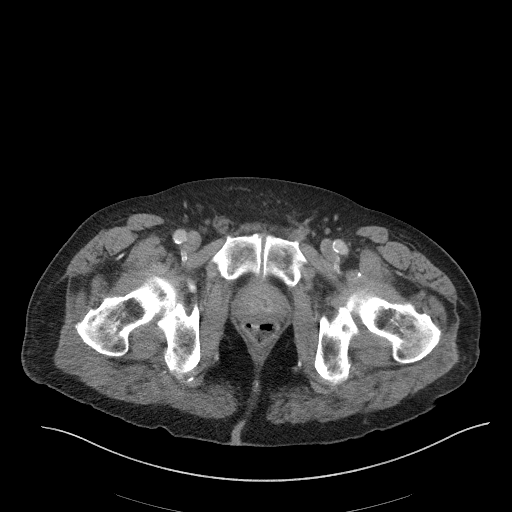
[im 26/101  soft-tissue]
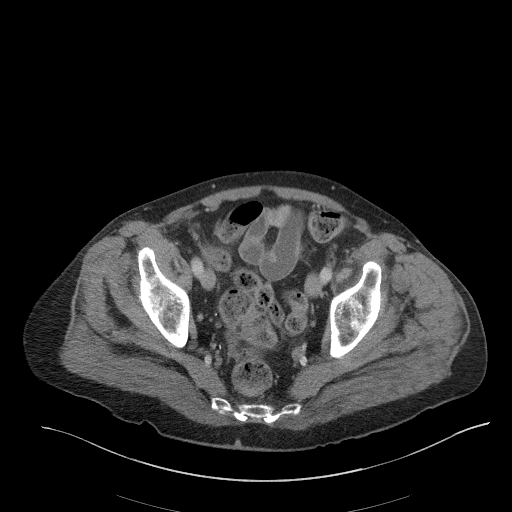
[im 32/101  soft-tissue]
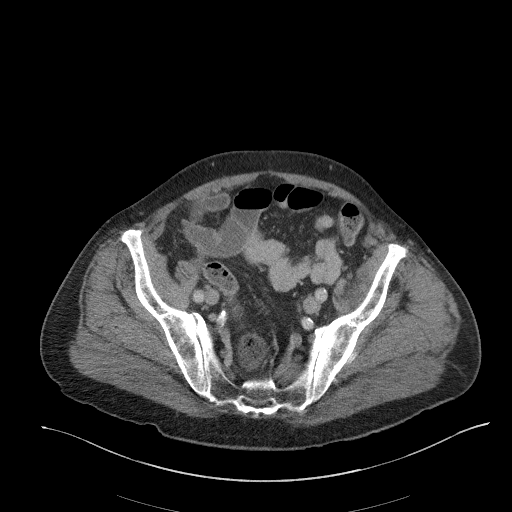
[im 38/101  soft-tissue]
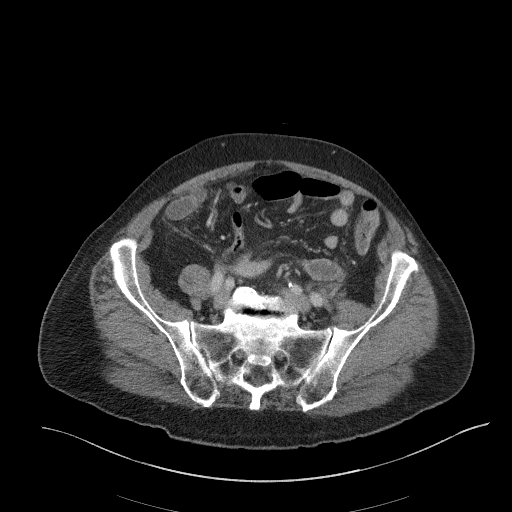
[im 44/101  soft-tissue]
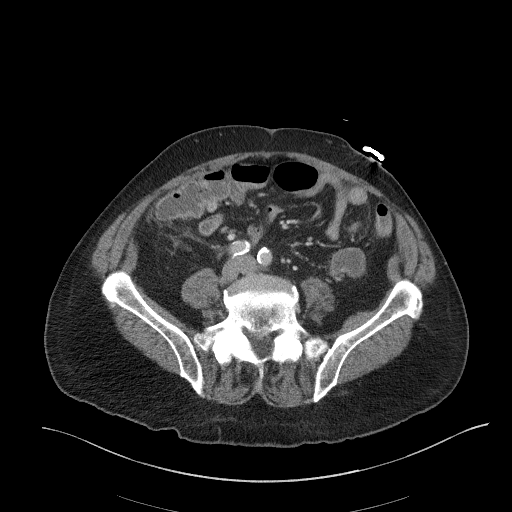
[im 57/101  soft-tissue]
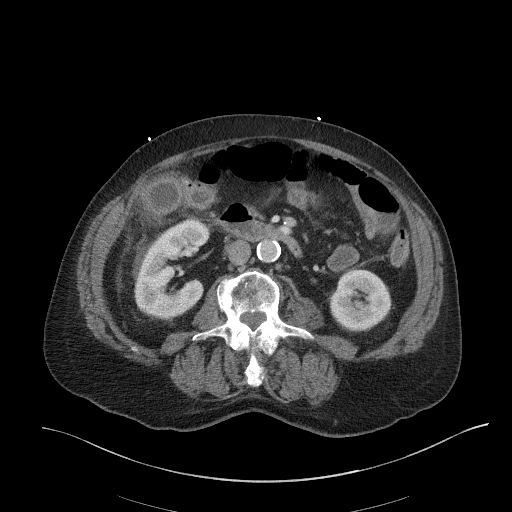
[im 63/101  soft-tissue]
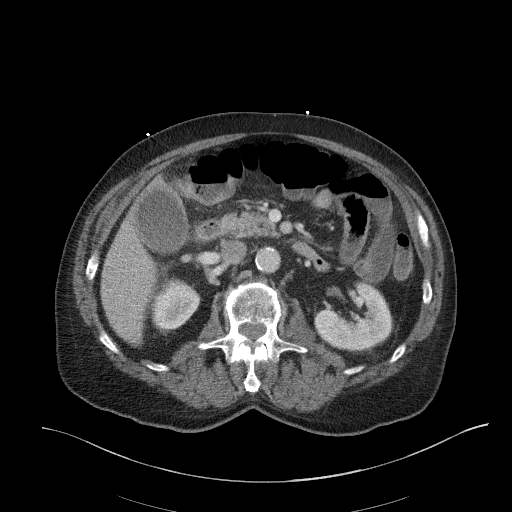
[im 69/101  soft-tissue]
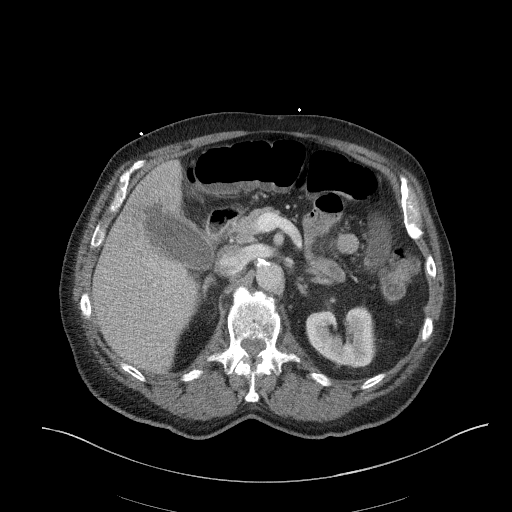
[im 69/101  bone]
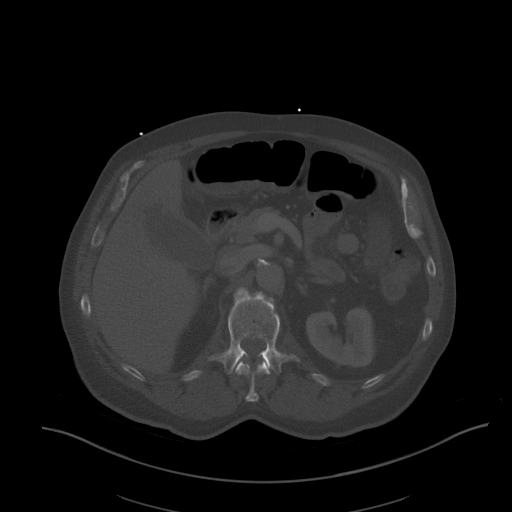
[im 76/101  soft-tissue]
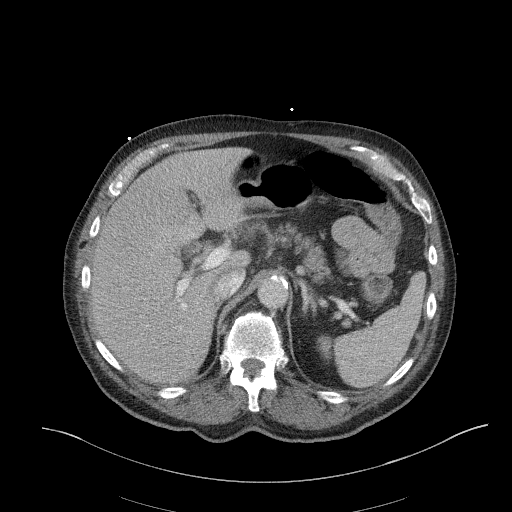
[im 88/101  soft-tissue]
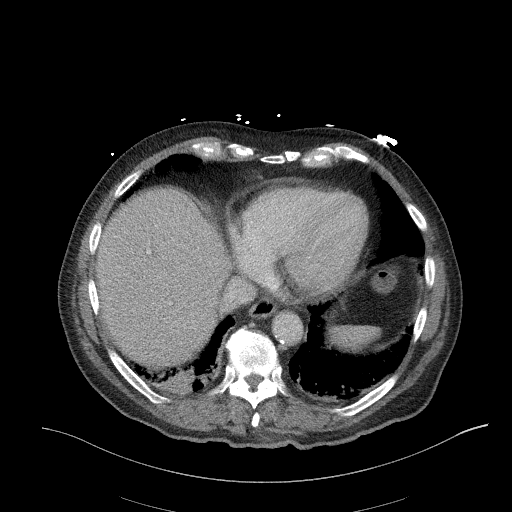
[im 94/101  soft-tissue]
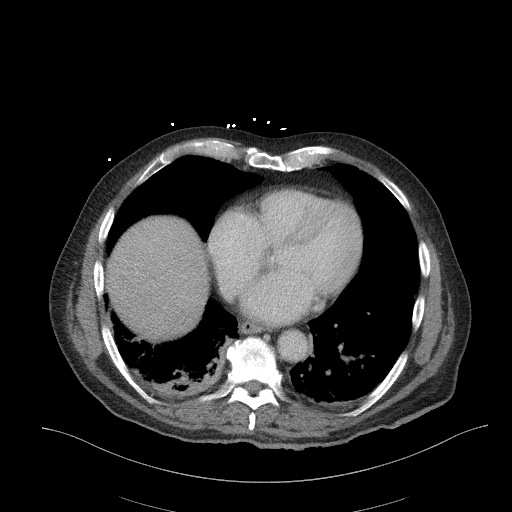

[Series 6: a/p w/ cor · coronal · 0.74mm/px · 3 of 136 slices shown]
[im 46/136  soft-tissue]
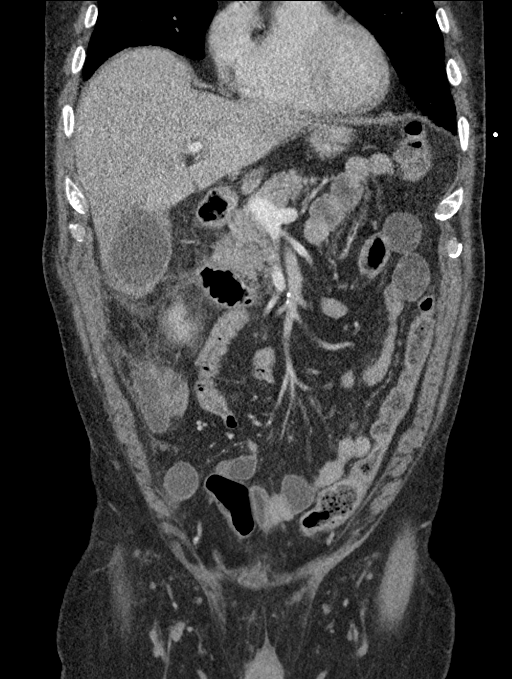
[im 61/136  soft-tissue]
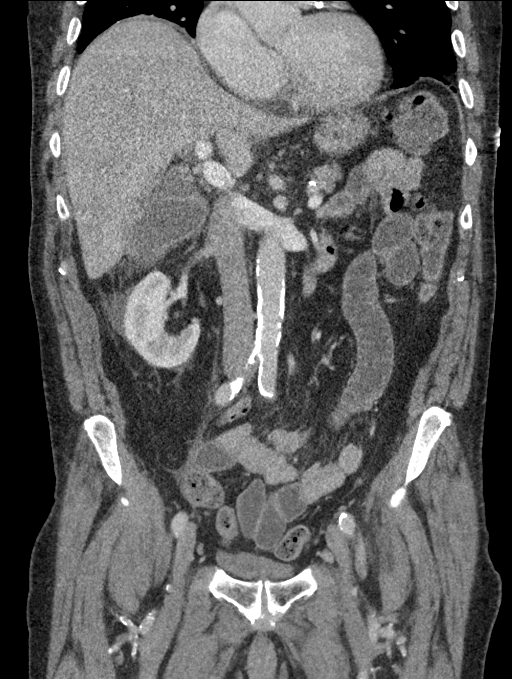
[im 76/136  soft-tissue]
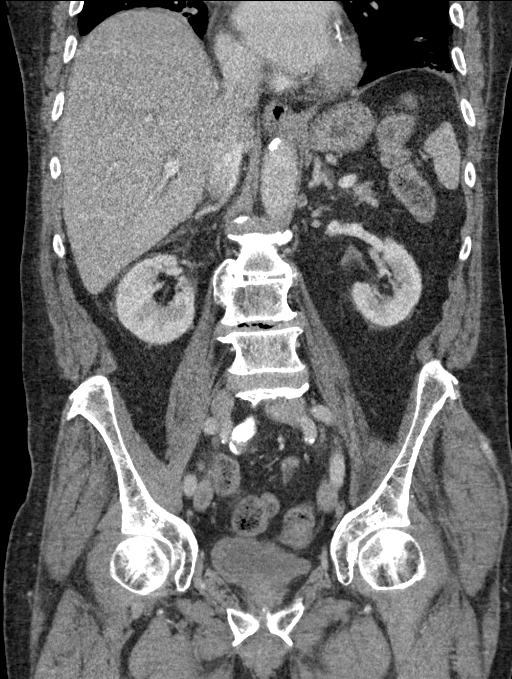

[15 of 46 positions shown; findings below may reference images not displayed]

FINDINGS: Lower chest: Coronary artery calcifications. Lower lobe bronchial
thickening. Dependent lower lobe densities, atelectasis on the left,
indeterminate on the right.

Hepatobiliary: Gallbladder is distended with gallbladder wall
thickening and adjacent pericholecystic fluid. No calcified
gallstones. There is no biliary dilatation. No focal hepatic lesion.

Pancreas: No ductal dilatation or inflammation.

Spleen: Normal in size without focal abnormality.

Adrenals/Urinary Tract: Normal adrenal glands. No hydronephrosis.
Small volume of right perinephric fluid may be sympathetic.
Symmetric renal excretion on delayed phase imaging. Urinary bladder
is nondistended.

Stomach/Bowel: The stomach is decompressed. Fluid within upper
abdominal and lower pelvic bowel lobes which are nondilated. Minimal
liquid stool in the cecum and transverse colon. Formed stool in the
more distal colon. No colonic inflammation. There is wall thickening
of the cecum and ascending colon. The dilated small bowel loop in
radiograph is not present. The appendix is not confidently
visualized.

Vascular/Lymphatic: Aortic and branch atherosclerosis without
aneurysm. No adenopathy.

Reproductive: Prominent prostate gland.

Other: Small volume of free fluid tracks in the pelvis. No free air.
No intra-abdominal abscess.

Musculoskeletal: Degenerative change throughout the spine and in
both hips. There are no acute or suspicious osseous abnormalities.
IMPRESSION: 1. Findings suspicious for acute cholecystitis. No calcified
gallstones are seen, however there is gallbladder distention, wall
thickening and pericholecystic fluid.
2. Colonic wall thickening with liquid stool in the ascending colon,
may be reactive versus less likely primary colitis. Fluid-filled
nondilated small bowel, mild ileus.
3. Minimal right perinephric fluid is likely reactive. No evidence
pyelonephritis.
4. Dependent lower lobes densities in the lung bases, atelectasis on
the left, indeterminate on the right for atelectasis versus
pneumonia.
5. Aortic atherosclerosis.

## 2019-03-21 IMAGING — RF DG CHOLANGIOGRAM OPERATIVE
1 series · 1 of 1 positions shown · non-contrast
Comparison: None.

CLINICAL DATA: 84-year-old male with a history of cholelithiasis

EXAM:
INTRAOPERATIVE CHOLANGIOGRAM
TECHNIQUE: Cholangiographic images from the C-arm fluoroscopic device were
submitted for interpretation post-operatively. Please see the
procedural report for the amount of contrast and the fluoroscopy
time utilized.

[Series 1: run · 1 of 1 slices shown]
[im 1/1]
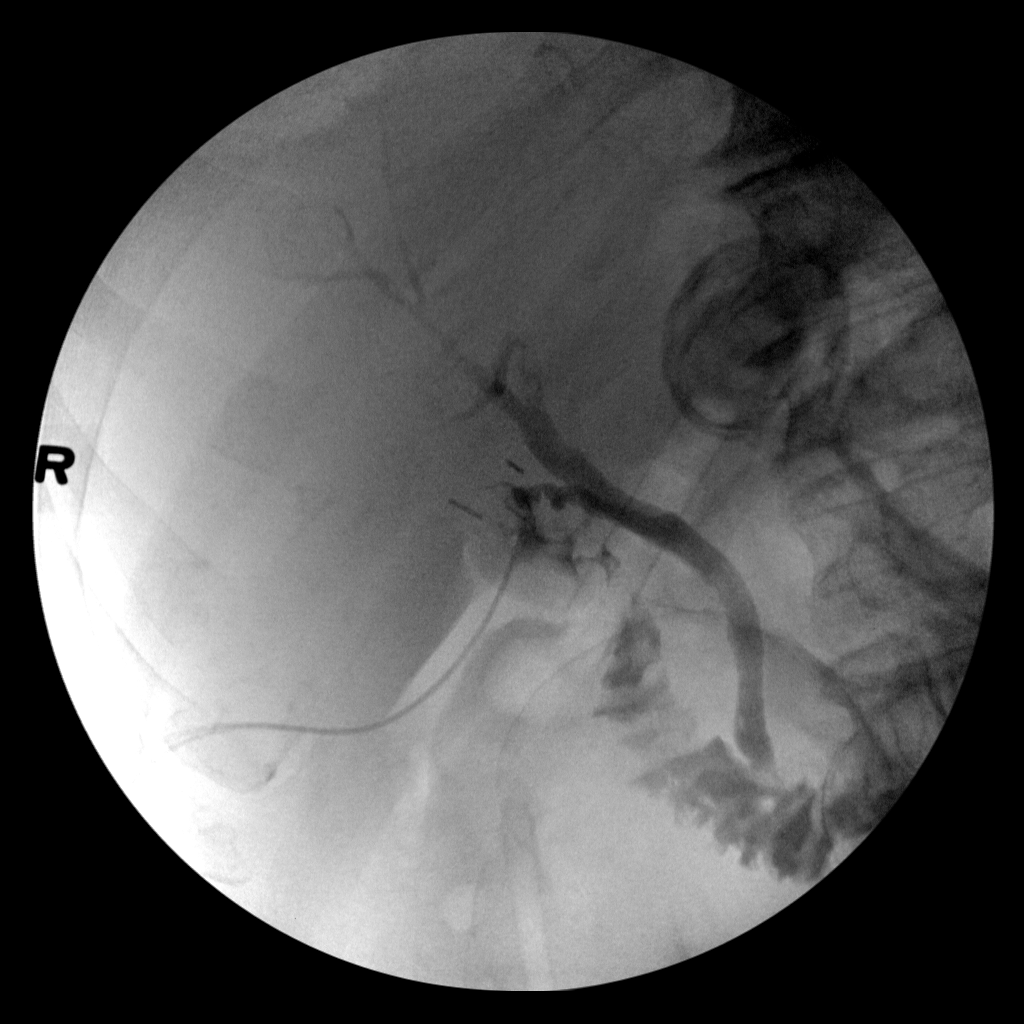

[1 of 1 positions shown; findings below may reference images not displayed]

FINDINGS: Surgical instruments project over the upper abdomen.

There is cannulation of the cystic duct/gallbladder neck, with
antegrade infusion of contrast. Caliber of the extrahepatic ductal
system within normal limits.

No large filling defect identified.

Free flow of contrast across the ampulla.
IMPRESSION: Intraoperative cholangiogram demonstrates extrahepatic biliary ducts
of unremarkable caliber, with no large filling defect identified.
Free flow of contrast across the ampulla.

Please refer to the dictated operative report for full details of
intraoperative findings and procedure

## 2019-03-21 IMAGING — DX DG CHEST 1V PORT
1 series · 1 of 1 positions shown · non-contrast
Comparison: 02/08/2017.

CLINICAL DATA: Wheezing.

EXAM:
PORTABLE CHEST 1 VIEW

[chest]
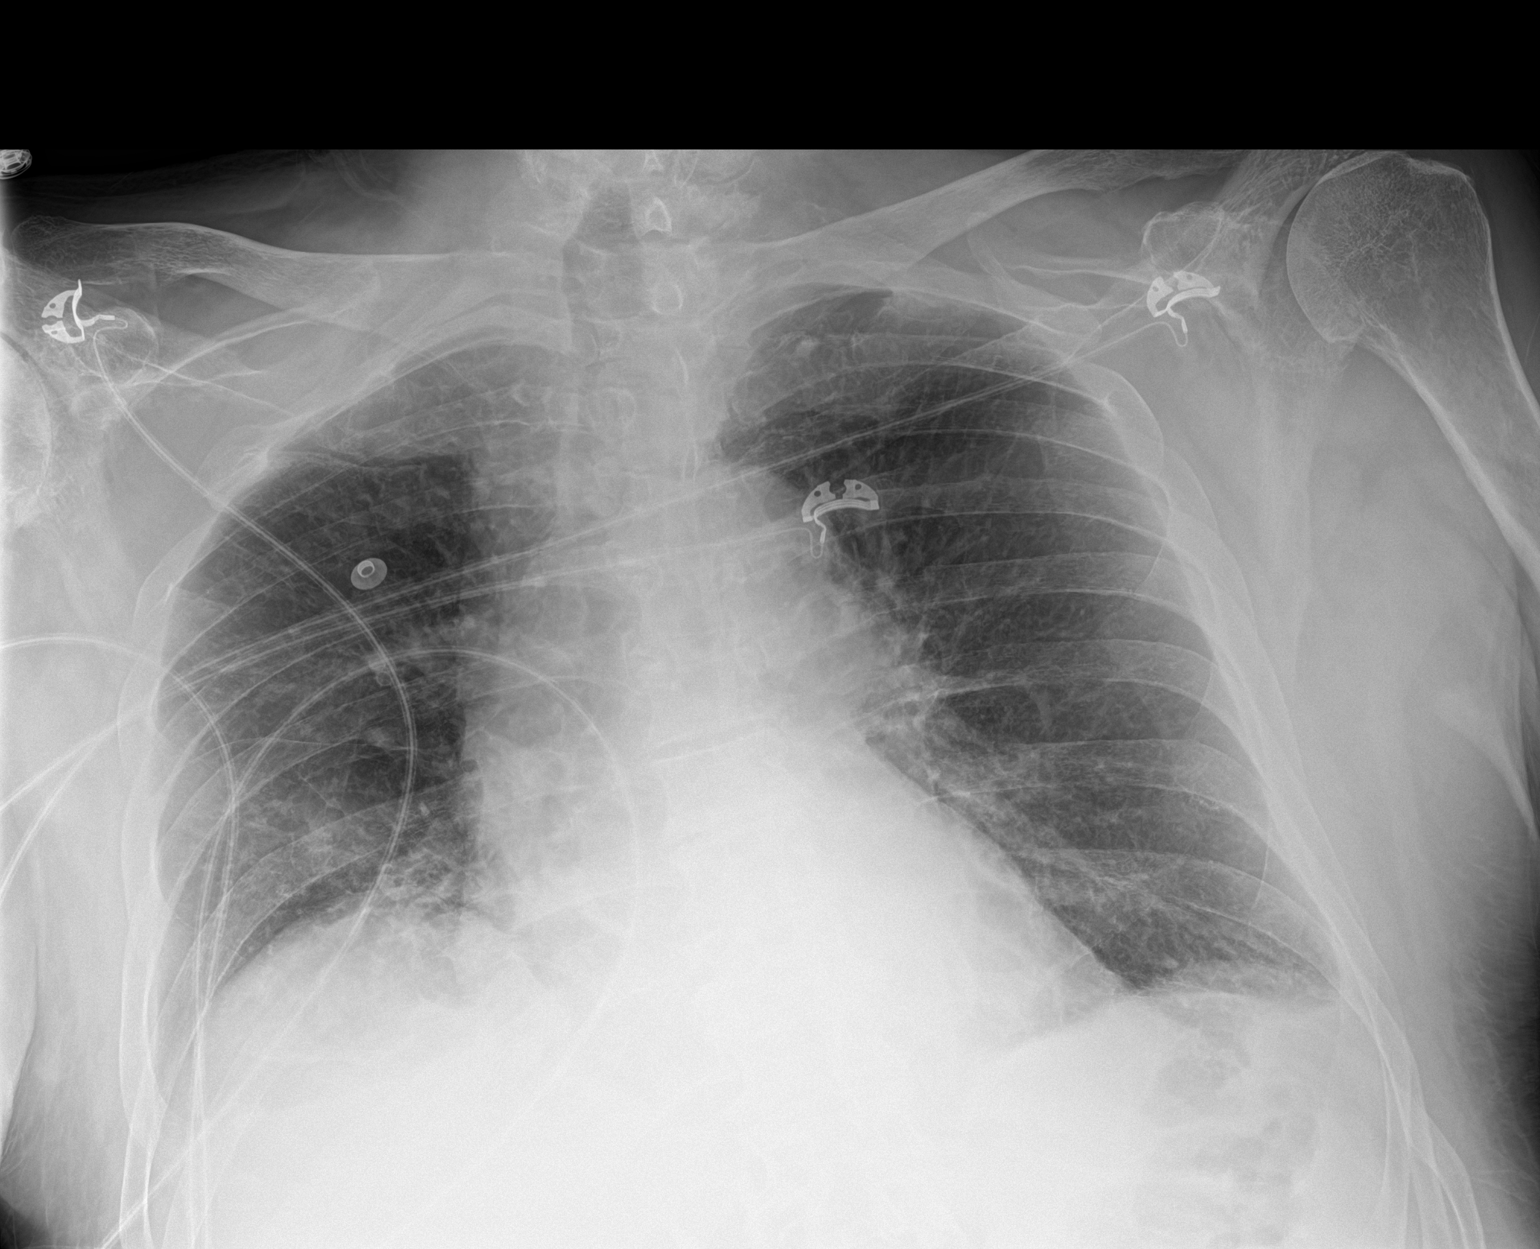

[1 of 1 positions shown; findings below may reference images not displayed]

FINDINGS: Mediastinum and hilar structures normal. Cardiomegaly with normal
pulmonary vascularity. Low lung volumes with mild basilar
atelectasis. Mild infiltrate right lung base cannot be excluded.
Stable calcified density left apex, most likely calcified granuloma.
No pleural effusion or pneumothorax .
IMPRESSION: 1. Cardiomegaly with normal pulmonary vascularity.

2. Low lung volumes with mild bibasilar atelectasis. Mild infiltrate
right lung base cannot be excluded .

## 2019-03-28 ENCOUNTER — Other Ambulatory Visit: Payer: Self-pay | Admitting: Cardiology

## 2019-04-22 NOTE — Progress Notes (Signed)
HPI: Follow-up atrial fibrillation. Patient found to have atrial fibrillation in June 2018. Echocardiogram showed ejection fraction 45-50%, mild mitral and tricuspid regurgitation. At that time he was found to have acute cholecystitis and underwent cholecystectomy. Patient had successful TEE guided cardioversion on 02/14/2017. Nuclear study July 2018 showed ejection fraction 42%. There was no ischemia.  Echocardiogram repeated October 2018 and showed normal LV function, severe left atrial enlargement and mild tricuspid regurgitation.  Since last seen,the patient denies any dyspnea on exertion, orthopnea, PND, pedal edema, palpitations, syncope or chest pain.   Current Outpatient Medications  Medication Sig Dispense Refill  . CARTIA XT 120 MG 24 hr capsule TAKE 1 CAPSULE(120 MG) BY MOUTH DAILY 90 capsule 3  . CARTIA XT 120 MG 24 hr capsule TAKE ONE CAPSULE BY MOUTH DAILY 21 capsule 0  . cetirizine (ZYRTEC) 10 MG tablet Take 10 mg by mouth daily.    Marland Kitchen ELIQUIS 5 MG TABS tablet TAKE 1 TABLET(5 MG) BY MOUTH TWICE DAILY 60 tablet 0  . fluticasone (FLOVENT HFA) 110 MCG/ACT inhaler Inhale 1 puff into the lungs 2 (two) times daily.     . metoprolol tartrate (LOPRESSOR) 100 MG tablet Take 1 tablet (100 mg total) by mouth 2 (two) times daily. MUST KEEP APPOINTMENT 04/23/19 WITH DR Anabelen Kaminsky FOR FUTURE REFILLS 180 tablet 0   No current facility-administered medications for this visit.      Past Medical History:  Diagnosis Date  . Arthritis    right knee oa  . Asthma   . Carpal tunnel syndrome    BILATERAL  . Cholecystitis   . Complication of anesthesia    adverse reaction to the spinal  . Hypertension   . PAF (paroxysmal atrial fibrillation) ([place])   . SBO (small bowel obstruction) ([place]) 12/2015    Past Surgical History:  Procedure Laterality Date  . CARDIOVERSION N/A 02/14/2017   Procedure: CARDIOVERSION;  Surgeon: Josue Hector, MD;  Location[facility] ENDOSCOPY;  Service: Cardiovascular;   Laterality: N/A;  . CHOLECYSTECTOMY  02/09/2017  . CHOLECYSTECTOMY N/A 02/09/2017   Procedure: LAPAROSCOPIC CHOLECYSTECTOMY WITH INTRAOPERATIVE CHOLANGIOGRAM;  Surgeon: Donnie Mesa, MD;  Location: [place];  Service: General;  Laterality: N/A;  . COLONOSCOPY WITH PROPOFOL N/A 11/09/2014   Procedure: COLONOSCOPY WITH PROPOFOL;  Surgeon: Garlan Fair, MD;  Location: [facility] ENDOSCOPY;  Service: Endoscopy;  Laterality: N/A;  . HERNIA REPAIR     2013  . QUADRICEPS TENDON REPAIR Left 06/07/2015   Procedure: REPAIR LEFT QUADRICEP TENDON;  Surgeon: Gaynelle Arabian, MD;  Location: [facility] ORS;  Service: Orthopedics;  Laterality: Left;  . TEE WITHOUT CARDIOVERSION N/A 02/14/2017   Procedure: TRANSESOPHAGEAL ECHOCARDIOGRAM (TEE);  Surgeon: Josue Hector, MD;  Location: [facility] ENDOSCOPY;  Service: Cardiovascular;  Laterality: N/A;  . TOTAL KNEE ARTHROPLASTY Right 12/27/2015   Procedure: RIGHT TOTAL KNEE ARTHROPLASTY;  Surgeon: Gaynelle Arabian, MD;  Location: [facility] ORS;  Service: Orthopedics;  Laterality: Right;    Social History   Socioeconomic History  . Marital status: Married    Spouse name: Not on file  . Number of children: Not on file  . Years of education: Not on file  . Highest education level: Not on file  Occupational History  . Not on file  Social Needs  . Financial resource strain: Not on file  . Food insecurity    Worry: Not on file    Inability: Not on file  . Transportation needs    Medical: Not on file  Non-medical: Not on file  Tobacco Use  . Smoking status: Never Smoker  . Smokeless tobacco: Never Used  Substance and Sexual Activity  . Alcohol use: Yes    Comment:  2 beers daily  . Drug use: No  . Sexual activity: Not on file  Lifestyle  . Physical activity    Days per week: Not on file    Minutes per session: Not on file  . Stress: Not on file  Relationships  . Social Herbalist on phone: Not on file    Gets together: Not on file    Attends religious service: Not on  file    Active member of club or organization: Not on file    Attends meetings of clubs or organizations: Not on file    Relationship status: Not on file  . Intimate partner violence    Fear of current or ex partner: Not on file    Emotionally abused: Not on file    Physically abused: Not on file    Forced sexual activity: Not on file  Other Topics Concern  . Not on file  Social History Narrative  . Not on file    Family History  Problem Relation Age of Onset  . Hypertension Father     ROS: no fevers or chills, productive cough, hemoptysis, dysphasia, odynophagia, melena, hematochezia, dysuria, hematuria, rash, seizure activity, orthopnea, PND, pedal edema, claudication. Remaining systems are negative.  Physical Exam: Well-developed well-nourished in no acute distress.  Skin is warm and dry.  HEENT is normal.  Neck is supple.  Chest is clear to auscultation with normal expansion.  Cardiovascular exam is regular rate and rhythm.  Abdominal exam nontender or distended. No masses palpated. Extremities show no edema. neuro grossly intact  ECG-sinus rhythm at a rate of 57, occasional PAC.  Personally reviewed  A/P  1 paroxysmal atrial fibrillation-patient remains in sinus rhythm today.  Continue beta-blocker and calcium blocker.  Continue apixaban.  Check hemoglobin and renal function.  2 hypertension-blood pressure is controlled today.  Continue present medications and follow.  3 history of cardiomyopathy-LV function improved on most recent echocardiogram.  Continue beta-blocker.  4 history of elevated troponin-occurred at the time of his atrial fibrillation.  Nuclear study showed no ischemia or infarction.  No chest pain.  Kirk Ruths, MD

## 2019-04-23 ENCOUNTER — Encounter: Payer: Self-pay | Admitting: Cardiology

## 2019-04-23 ENCOUNTER — Other Ambulatory Visit: Payer: Self-pay

## 2019-04-23 ENCOUNTER — Ambulatory Visit (INDEPENDENT_AMBULATORY_CARE_PROVIDER_SITE_OTHER): Payer: Medicare Other | Admitting: Cardiology

## 2019-04-23 VITALS — BP 110/72 | HR 57 | Temp 97.2°F | Ht 71.0 in | Wt 201.0 lb

## 2019-04-23 DIAGNOSIS — I480 Paroxysmal atrial fibrillation: Secondary | ICD-10-CM

## 2019-04-23 DIAGNOSIS — I428 Other cardiomyopathies: Secondary | ICD-10-CM

## 2019-04-23 DIAGNOSIS — I10 Essential (primary) hypertension: Secondary | ICD-10-CM

## 2019-04-23 NOTE — Patient Instructions (Signed)
Medication Instructions:  NO CHANGE If you need a refill on your cardiac medications before your next appointment, please call your pharmacy.   Lab work: Your physician recommends that you HAVE LAB WORK TODAY If you have labs (blood work) drawn today and your tests are completely normal, you will receive your results only by: . MyChart Message (if you have MyChart) OR . A paper copy in the mail If you have any lab test that is abnormal or we need to change your treatment, we will call you to review the results.  Follow-Up: At [facility], you and your health needs are our priority.  As part of our continuing mission to provide you with exceptional heart care, we have created designated Provider Care Teams.  These Care Teams include your primary Cardiologist (physician) and Advanced Practice Providers (APPs -  Physician Assistants and Nurse Practitioners) who all work together to provide you with the care you need, when you need it. You will need a follow up appointment in 12 months.  Please call our office 2 months in advance to schedule this appointment.  You may see BRIAN CRENSHAW MD or one of the following Advanced Practice Providers on your designated Care Team:   Luke Kilroy, PA-C Krista Kroeger, PA-C . Callie Goodrich, PA-C     

## 2019-04-24 LAB — CBC
Hematocrit: 41.6 % (ref 37.5–51.0)
Hemoglobin: 14.6 g/dL (ref 13.0–17.7)
MCH: 34 pg — ABNORMAL HIGH (ref 26.6–33.0)
MCHC: 35.1 g/dL (ref 31.5–35.7)
MCV: 97 fL (ref 79–97)
Platelets: 229 10*3/uL (ref 150–450)
RBC: 4.3 x10E6/uL (ref 4.14–5.80)
RDW: 12.2 % (ref 11.6–15.4)
WBC: 6.1 10*3/uL (ref 3.4–10.8)

## 2019-04-24 LAB — BASIC METABOLIC PANEL
BUN/Creatinine Ratio: 16 (ref 10–24)
BUN: 12 mg/dL (ref 8–27)
CO2: 23 mmol/L (ref 20–29)
Calcium: 9.2 mg/dL (ref 8.6–10.2)
Chloride: 101 mmol/L (ref 96–106)
Creatinine, Ser: 0.74 mg/dL — ABNORMAL LOW (ref 0.76–1.27)
GFR calc Af Amer: 97 mL/min/{1.73_m2} (ref >59)
GFR calc non Af Amer: 84 mL/min/{1.73_m2} (ref >59)
Glucose: 88 mg/dL (ref 65–99)
Potassium: 4.7 mmol/L (ref 3.5–5.2)
Sodium: 137 mmol/L (ref 134–144)

## 2019-04-27 ENCOUNTER — Other Ambulatory Visit: Payer: Self-pay | Admitting: Cardiology

## 2019-05-12 ENCOUNTER — Other Ambulatory Visit: Payer: Self-pay

## 2019-05-13 ENCOUNTER — Other Ambulatory Visit: Payer: Self-pay

## 2019-05-13 MED ORDER — DILTIAZEM HCL ER COATED BEADS 120 MG PO CP24: ORAL_CAPSULE | ORAL | 3 refills | Status: DC

## 2019-05-27 ENCOUNTER — Other Ambulatory Visit: Payer: Self-pay

## 2019-05-27 MED ORDER — METOPROLOL TARTRATE 100 MG PO TABS: 100.0000 mg | ORAL_TABLET | Freq: Two times a day (BID) | ORAL | 1 refills | Status: DC

## 2019-10-23 ENCOUNTER — Telehealth: Payer: Self-pay | Admitting: Cardiology

## 2019-10-23 DIAGNOSIS — I10 Essential (primary) hypertension: Secondary | ICD-10-CM

## 2019-10-23 MED ORDER — LOSARTAN POTASSIUM 50 MG PO TABS: 50.0000 mg | ORAL_TABLET | Freq: Every day | ORAL | 3 refills | Status: DC

## 2019-10-23 NOTE — Telephone Encounter (Signed)
Pt c/o BP issue: STAT if pt c/o blurred vision, one-sided weakness or slurred speech  1. What are your last 5 BP readings?  04/23/19 110/72 (when last seen by Dr. Stanford Breed) 03/03 155/92 HR 63 03/02 149/81 HR indicates possible Afib 03/01 157/88 HR 68 02/28 132/79 HR 62 no indications of Afib 02/27 182/92 HR 57  2. Are you having any other symptoms (ex. Dizziness, headache, blurred vision, passed out)? No   3. What is your BP issue? Daniel Yu is calling concerned with his recent elevated BP. He states he has no symptoms, but is concerned with the numbers and wanted to inform Dr. Stanford Breed. Please advice

## 2019-10-23 NOTE — Telephone Encounter (Signed)
Spoke with pt, he reports a lot of worry for his wife over the last 2 weeks, otherwise no life style changes. Medications confirmed. Will forward for dr Stanford Breed review

## 2019-10-23 NOTE — Telephone Encounter (Signed)
Add losartan 50 mg daily; bmet one week Kirk Ruths

## 2019-10-23 NOTE — Telephone Encounter (Signed)
Spoke with pt, Aware of dr crenshaw's recommendations. New script sent to the pharmacy and Lab orders mailed to the pt  

## 2019-10-27 ENCOUNTER — Other Ambulatory Visit: Payer: Self-pay

## 2019-10-27 MED ORDER — APIXABAN 5 MG PO TABS: ORAL_TABLET | ORAL | 1 refills | Status: DC

## 2019-11-04 LAB — BASIC METABOLIC PANEL
BUN/Creatinine Ratio: 14 (ref 10–24)
BUN: 14 mg/dL (ref 8–27)
CO2: 21 mmol/L (ref 20–29)
Calcium: 8.6 mg/dL (ref 8.6–10.2)
Chloride: 103 mmol/L (ref 96–106)
Creatinine, Ser: 0.98 mg/dL (ref 0.76–1.27)
GFR calc Af Amer: 81 mL/min/{1.73_m2} (ref >59)
GFR calc non Af Amer: 70 mL/min/{1.73_m2} (ref >59)
Glucose: 103 mg/dL — ABNORMAL HIGH (ref 65–99)
Potassium: 4.6 mmol/L (ref 3.5–5.2)
Sodium: 137 mmol/L (ref 134–144)

## 2019-11-19 ENCOUNTER — Telehealth: Payer: Self-pay | Admitting: Cardiology

## 2019-11-19 NOTE — Telephone Encounter (Signed)
Contacted patient- he states that after he was started on the new medication the first of March losartan 50 mg daily, he states his blood pressure was fine. But he has since noticed that the BP has increased again, he is now waking up with good blood pressures, but throughout the day it increases from 115-117/75 to numbers like 145-150/85-90's he states he is in possible AFIB for about the past week, but he is concerned of the BP increase throughout the day.  He states he takes losartan in the morning, metoprolol 1 in the AM, and 1 in the PM. No changes to diet. I advised patient I would route a message to MD as well as PharmD for recommendations.  Patient states he is feeling great, no chest pain, no complaints, or symptoms.

## 2019-11-19 NOTE — Telephone Encounter (Signed)
If patient feels he continues to be in atrial fibrillation would need appointment with APP and electrocardiogram to verify and decide further management. Kirk Ruths

## 2019-11-19 NOTE — Telephone Encounter (Signed)
Contacted patient- gave results from PharmD and MD.  Patient will try to move Losartan- as well as, monitoring the AFIB- advised him to call back if it does not go away, or causes him issues.  Patient verbalized understanding,

## 2019-11-19 NOTE — Telephone Encounter (Signed)
Attempted to contact patient back to discuss message from PharmD. Left voicemail, left call back number

## 2019-11-19 NOTE — Telephone Encounter (Signed)
Afib may cause some variation in BP readings. If blood pressure is good 1st thing in the morning, will recommend to change losartan administration time to lunch (mid-day) if possible. Continue metoprolol twice daily as usual.

## 2019-11-19 NOTE — Telephone Encounter (Signed)
   Pt c/o medication issue:  1. Name of Medication: Losartan  2. How are you currently taking this medication (dosage and times per day)? Take 1 tablet (50 mg total) by mouth daily.  3. Are you having a reaction (difficulty breathing--STAT)?   4. What is your medication issue? Pt said lorsatan worked fine. However, his BP is good in the morning but not in the afternoon.    Please call

## 2019-11-19 NOTE — Telephone Encounter (Signed)
Spoke with pt, per dr Stanford Breed follow up scheduled.

## 2019-11-20 ENCOUNTER — Ambulatory Visit: Payer: Medicare Other | Admitting: Physician Assistant

## 2019-11-20 ENCOUNTER — Other Ambulatory Visit: Payer: Self-pay

## 2019-11-20 ENCOUNTER — Encounter: Payer: Self-pay | Admitting: Physician Assistant

## 2019-11-20 VITALS — BP 130/90 | HR 69 | Temp 97.3°F | Ht 70.0 in | Wt 204.6 lb

## 2019-11-20 DIAGNOSIS — I10 Essential (primary) hypertension: Secondary | ICD-10-CM

## 2019-11-20 DIAGNOSIS — I4819 Other persistent atrial fibrillation: Secondary | ICD-10-CM

## 2019-11-20 NOTE — Patient Instructions (Signed)
Medication Instructions:  Not needed *If you need a refill on your cardiac medications before your next appointment, please call your pharmacy*   Lab Work: Not needed  If you have labs (blood work) drawn today and your tests are completely normal, you will receive your results only by: Marland Kitchen MyChart Message (if you have MyChart) OR . A paper copy in the mail If you have any lab test that is abnormal or we need to change your treatment, we will call you to review the results.   Testing/Procedures: Not  needed   Follow-Up: At [facility], you and your health needs are our priority.  As part of our continuing mission to provide you with exceptional heart care, we have created designated Provider Care Teams.  These Care Teams include your primary Cardiologist (physician) and Advanced Practice Providers (APPs -  Physician Assistants and Nurse Practitioners) who all work together to provide you with the care you need, when you need it.    Your next appointment:   Will inform you of upcoming appointment once decision is made later today    The format for your next appointment:   In Person  Provider:   Almyra Deforest, PA-C or Dr Kirk Ruths   Other instructions  Will contact you later today @ 1 PM  By phone

## 2019-11-20 NOTE — Progress Notes (Deleted)
Cardiology Office Note:    Date:  11/20/2019   ID:  [name], DOB [birth year], MRN [id number]  PCP:  Lavone Orn, MD  Cardiologist:  Kirk Ruths, MD  Electrophysiologist:  None   Referring MD: Lavone Orn, MD   No chief complaint on file. ***  History of Present Illness:    Daniel Yu is a 86 y.o. male with a hx of atrial fibrillation and hypertension. Patient was diagnosed with atrial fibrillation in June 2018. Echocardiogram at the time showed EF 45 to 50%, mild mitral and tricuspid regurgitation. He was also found to have acute cholecystitis and underwent cholecystectomy. He subsequently underwent TEE guided cardioversion on 02/14/2017 include Myoview in July 2018 showed EF 42%, no ischemia. Repeat echocardiogram in October 2018 showed normalization of ejection fraction, severe LAE, mild tricuspid regurgitation. Patient was last seen by Dr. Stanford Breed on 04/23/2019 at which time he was doing well. He is on combination of metoprolol, Cardizem and Eliquis.  Patient presents today for cardiology office visit.  Several weeks ago, her wife went to the hospital because of hypotension.  Afterward, they have been checking their blood pressure more often.  Starting on 3/22, he started rechecking his blood pressure medication and noted the blood pressure cuff mentions irregular heartbeat.  He suspect he has went back into atrial fibrillation, however he has no cardiac awareness of this.  He specifically denied any chest pain, shortness of breath, fatigue or palpitation.  Otherwise he has been very much compliant with rate control agent and also Eliquis.  During the interview today we discussed 2 options in this case. Option #1 would be to proceed with a cardioversion without TEE.  Second possibility is to leave him in atrial fibrillation.  He is aware that the longer he stays in atrial fibrillation the less likely he will ever maintain sinus rhythm.  On the other hand, because he has a history of  severely dilated left atrium, possibility of recurrent atrial fibrillation in the future is high.  Since he is completely asymptomatic, I do not think he necessarily need antiarrhythmic therapy.  I told him that both options are very reasonable in this case.  At this time he cannot decide on his preference, I plan to call both his wife and him around 1 PM today to rediscuss the issue and to formulate a plan.  Past Medical History:  Diagnosis Date  . Arthritis    right knee oa  . Asthma   . Carpal tunnel syndrome    BILATERAL  . Cholecystitis   . Complication of anesthesia    adverse reaction to the spinal  . Hypertension   . PAF (paroxysmal atrial fibrillation) ([place])   . SBO (small bowel obstruction) ([place]) 12/2015    Past Surgical History:  Procedure Laterality Date  . CARDIOVERSION N/A 02/14/2017   Procedure: CARDIOVERSION;  Surgeon: Josue Hector, MD;  Location: [facility] ENDOSCOPY;  Service: Cardiovascular;  Laterality: N/A;  . CHOLECYSTECTOMY  02/09/2017  . CHOLECYSTECTOMY N/A 02/09/2017   Procedure: LAPAROSCOPIC CHOLECYSTECTOMY WITH INTRAOPERATIVE CHOLANGIOGRAM;  Surgeon: Donnie Mesa, MD;  Location: [place];  Service: General;  Laterality: N/A;  . COLONOSCOPY WITH PROPOFOL N/A 11/09/2014   Procedure: COLONOSCOPY WITH PROPOFOL;  Surgeon: Garlan Fair, MD;  Location: [facility] ENDOSCOPY;  Service: Endoscopy;  Laterality: N/A;  . HERNIA REPAIR     2013  . QUADRICEPS TENDON REPAIR Left 06/07/2015   Procedure: REPAIR LEFT QUADRICEP TENDON;  Surgeon: Gaynelle Arabian, MD;  Location: [facility] ORS;  Service: Orthopedics;  Laterality: Left;  . TEE WITHOUT CARDIOVERSION N/A 02/14/2017   Procedure: TRANSESOPHAGEAL ECHOCARDIOGRAM (TEE);  Surgeon: Josue Hector, MD;  Location: [facility] ENDOSCOPY;  Service: Cardiovascular;  Laterality: N/A;  . TOTAL KNEE ARTHROPLASTY Right 12/27/2015   Procedure: RIGHT TOTAL KNEE ARTHROPLASTY;  Surgeon: Gaynelle Arabian, MD;  Location: [facility] ORS;  Service: Orthopedics;  Laterality: Right;     Current Medications: No outpatient medications have been marked as taking for the 11/20/19 encounter (Appointment) with Almyra Deforest, [place].     Allergies:   Patient has no known allergies.   Social History   Socioeconomic History  . Marital status: Married    Spouse name: Not on file  . Number of children: Not on file  . Years of education: Not on file  . Highest education level: Not on file  Occupational History  . Not on file  Tobacco Use  . Smoking status: Never Smoker  . Smokeless tobacco: Never Used  Substance and Sexual Activity  . Alcohol use: Yes    Comment:  2 beers daily  . Drug use: No  . Sexual activity: Not on file  Other Topics Concern  . Not on file  Social History Narrative  . Not on file   Social Determinants of Health   Financial Resource Strain:   . Difficulty of Paying Living Expenses:   Food Insecurity:   . Worried About Charity fundraiser in the Last Year:   . Arboriculturist in the Last Year:   Transportation Needs:   . Film/video editor (Medical):   Marland Kitchen Lack of Transportation (Non-Medical):   Physical Activity:   . Days of Exercise per Week:   . Minutes of Exercise per Session:   Stress:   . Feeling of Stress :   Social Connections:   . Frequency of Communication with Friends and Family:   . Frequency of Social Gatherings with Friends and Family:   . Attends Religious Services:   . Active Member of Clubs or Organizations:   . Attends Archivist Meetings:   Marland Kitchen Marital Status:      Family History: The patient's ***family history includes Hypertension in his father.  ROS:   Please see the history of present illness.    *** All other systems reviewed and are negative.  EKGs/Labs/Other Studies Reviewed:    The following studies were reviewed today: ***  EKG:  EKG is *** ordered today.  The ekg ordered today demonstrates ***  Recent Labs: 04/23/2019: Hemoglobin 14.6; Platelets 229 11/03/2019: BUN 14; Creatinine, Ser 0.98;  Potassium 4.6; Sodium 137  Recent Lipid Panel No results found for: CHOL, TRIG, HDL, CHOLHDL, VLDL, LDLCALC, LDLDIRECT  Physical Exam:    VS:  There were no vitals taken for this visit.    Wt Readings from Last 3 Encounters:  04/23/19 201 lb (91.2 kg)  01/17/18 201 lb (91.2 kg)  05/17/17 192 lb (87.1 kg)     GEN: *** Well nourished, well developed in no acute distress HEENT: Normal NECK: No JVD; No carotid bruits LYMPHATICS: No lymphadenopathy CARDIAC: ***RRR, no murmurs, rubs, gallops RESPIRATORY:  Clear to auscultation without rales, wheezing or rhonchi  ABDOMEN: Soft, non-tender, non-distended MUSCULOSKELETAL:  No edema; No deformity  SKIN: Warm and dry NEUROLOGIC:  Alert and oriented x 3 PSYCHIATRIC:  Normal affect   ASSESSMENT:    No diagnosis found. PLAN:    In order of problems listed above:  1. ***   Medication Adjustments/Labs  and Tests Ordered: Current medicines are reviewed at length with the patient today.  Concerns regarding medicines are outlined above.  No orders of the defined types were placed in this encounter.  No orders of the defined types were placed in this encounter.   There are no Patient Instructions on file for this visit.   Hilbert Corrigan, [place]  11/20/2019 8:19 AM    [facility]

## 2019-11-20 NOTE — Progress Notes (Signed)
Cardiology Office Note:    Date:  11/22/2019   ID:  Daniel Yu, DOB 08/01/1932, MRN [id number]  PCP:  Lavone Orn, MD  Cardiologist:  Kirk Ruths, MD  Electrophysiologist:  None   Referring MD: Lavone Orn, MD   Chief Complaint  Patient presents with  . Follow-up    recurrent afib    History of Present Illness:    Daniel Yu is a 86 y.o. male with a hx of atrial fibrillation and hypertension. Patient was diagnosed with atrial fibrillation in June 2018. Echocardiogram at the time showed EF 45 to 50%, mild mitral and tricuspid regurgitation. He was also found to have acute cholecystitis and underwent cholecystectomy. He subsequently underwent TEE guided cardioversion on 02/14/2017 include Myoview in July 2018 showed EF 42%, no ischemia. Repeat echocardiogram in October 2018 showed normalization of ejection fraction, severe LAE, mild tricuspid regurgitation. Patient was last seen by Dr. Stanford Breed on 04/23/2019 at which time he was doing well. He is on combination of metoprolol, Cardizem and Eliquis.  Patient presents today for cardiology office visit.  Several weeks ago, her wife went to the hospital because of hypotension.  Afterward, they have been checking their blood pressure more often.  Starting on 3/22, he started rechecking his blood pressure medication and noted the blood pressure cuff mentions irregular heartbeat.  He suspect he has went back into atrial fibrillation, however he has no cardiac awareness of this.  He specifically denied any chest pain, shortness of breath, fatigue or palpitation.  Otherwise he has been very much compliant with rate control agent and also Eliquis.  During the interview today we discussed 2 options in this case. Option #1 would be to proceed with a cardioversion without TEE.  Second possibility is to leave him in atrial fibrillation.  He is aware that the longer he stays in atrial fibrillation the less likely he will ever maintain sinus rhythm.  On  the other hand, because he has a history of severely dilated left atrium, possibility of recurrent atrial fibrillation in the future is high.  Since he is completely asymptomatic, I do not think he necessarily need antiarrhythmic therapy.  I told him that both options are very reasonable in this case.  At this time he cannot decide on his preference, I plan to call both his wife and him around 1 PM today to rediscuss the issue and to formulate a plan.  Addendum, I have discussed with the patient who wished to proceed with cardioversion.  We will arrange cardioversion with Dr. Stanford Breed.  He is aware that if this cardioversion fails, the plan would be to rate control in the future.  Past Medical History:  Diagnosis Date  . Arthritis    right knee oa  . Asthma   . Carpal tunnel syndrome    BILATERAL  . Cholecystitis   . Complication of anesthesia    adverse reaction to the spinal  . Hypertension   . PAF (paroxysmal atrial fibrillation) ([place])   . SBO (small bowel obstruction) ([place]) 12/2015    Past Surgical History:  Procedure Laterality Date  . CARDIOVERSION N/A 02/14/2017   Procedure: CARDIOVERSION;  Surgeon: Josue Hector, MD;  Location: [facility];  Service: Cardiovascular;  Laterality: N/A;  . CHOLECYSTECTOMY  02/09/2017  . CHOLECYSTECTOMY N/A 02/09/2017   Procedure: LAPAROSCOPIC CHOLECYSTECTOMY WITH INTRAOPERATIVE CHOLANGIOGRAM;  Surgeon: Donnie Mesa, MD;  Location: [facility];  Service: General;  Laterality: N/A;  . COLONOSCOPY WITH PROPOFOL N/A 11/09/2014   Procedure:  COLONOSCOPY WITH PROPOFOL;  Surgeon: Garlan Fair, MD;  Location: [facility];  Service: Endoscopy;  Laterality: N/A;  . HERNIA REPAIR     2013  . QUADRICEPS TENDON REPAIR Left 06/07/2015   Procedure: REPAIR LEFT QUADRICEP TENDON;  Surgeon: Gaynelle Arabian, MD;  Location: [facility];  Service: Orthopedics;  Laterality: Left;  . TEE WITHOUT CARDIOVERSION N/A 02/14/2017   Procedure: TRANSESOPHAGEAL ECHOCARDIOGRAM (TEE);   Surgeon: Josue Hector, MD;  Location: [facility] ENDOSCOPY;  Service: Cardiovascular;  Laterality: N/A;  . TOTAL KNEE ARTHROPLASTY Right 12/27/2015   Procedure: RIGHT TOTAL KNEE ARTHROPLASTY;  Surgeon: Gaynelle Arabian, MD;  Location: [facility];  Service: Orthopedics;  Laterality: Right;    Current Medications: Current Meds  Medication Sig  . apixaban (ELIQUIS) 5 MG TABS tablet TAKE 1 TABLET(5 MG) BY MOUTH TWICE DAILY  . CARTIA XT 120 MG 24 hr capsule TAKE ONE CAPSULE BY MOUTH DAILY  . cetirizine (ZYRTEC) 10 MG tablet Take 10 mg by mouth daily.  . fluticasone (FLOVENT HFA) 110 MCG/ACT inhaler Inhale 1 puff into the lungs 2 (two) times daily.   Marland Kitchen losartan (COZAAR) 50 MG tablet Take 1 tablet (50 mg total) by mouth daily.  . metoprolol tartrate (LOPRESSOR) 100 MG tablet Take 1 tablet (100 mg total) by mouth 2 (two) times daily.     Allergies:   Patient has no known allergies.   Social History   Socioeconomic History  . Marital status: Married    Spouse name: Not on file  . Number of children: Not on file  . Years of education: Not on file  . Highest education level: Not on file  Occupational History  . Not on file  Tobacco Use  . Smoking status: Never Smoker  . Smokeless tobacco: Never Used  Substance and Sexual Activity  . Alcohol use: Yes    Comment:  2 beers daily  . Drug use: No  . Sexual activity: Not on file  Other Topics Concern  . Not on file  Social History Narrative  . Not on file   Social Determinants of Health   Financial Resource Strain:   . Difficulty of Paying Living Expenses:   Food Insecurity:   . Worried About Charity fundraiser in the Last Year:   . Arboriculturist in the Last Year:   Transportation Needs:   . Film/video editor (Medical):   Marland Kitchen Lack of Transportation (Non-Medical):   Physical Activity:   . Days of Exercise per Week:   . Minutes of Exercise per Session:   Stress:   . Feeling of Stress :   Social Connections:   . Frequency of  Communication with Friends and Family:   . Frequency of Social Gatherings with Friends and Family:   . Attends Religious Services:   . Active Member of Clubs or Organizations:   . Attends Archivist Meetings:   Marland Kitchen Marital Status:      Family History: The patient's family history includes Hypertension in his father.  ROS:   Please see the history of present illness.     All other systems reviewed and are negative.  EKGs/Labs/Other Studies Reviewed:    The following studies were reviewed today:  Myoview 03/08/2017  The left ventricular ejection fraction is moderately decreased (30-44%).  Nuclear stress EF: 42%.  There was no ST segment deviation noted during stress.  This is an intermediate risk study due to reduced systolic function. No ischemia is noted.  Echo 05/25/2017 LV EF: 50% -  55%  Study Conclusions   - Left ventricle: The cavity size was normal. There was moderate  concentric hypertrophy. Systolic function was normal. The  estimated ejection fraction was in the range of 50% to 55%. Wall  motion was normal; there were no regional wall motion  abnormalities. The study is not technically sufficient to allow  evaluation of LV diastolic function.  - Aortic valve: There was no regurgitation.  - Mitral valve: There was trivial regurgitation.  - Left atrium: The atrium was severely dilated.  - Right ventricle: The cavity size was normal. Wall thickness was  normal. Systolic function was normal.  - Tricuspid valve: There was mild regurgitation.  - Pulmonary arteries: Systolic pressure was within the normal  range. PA peak pressure: 21 mm Hg (S).   Impressions:   - Compared with the echo [id number], systolic function is slightly  improved.    EKG:  EKG is ordered today.  The ekg ordered today demonstrates atrial fibrillation  Recent Labs: 04/23/2019: Hemoglobin 14.6; Platelets 229 11/03/2019: BUN 14; Creatinine, Ser 0.98; Potassium 4.6;  Sodium 137  Recent Lipid Panel No results found for: CHOL, TRIG, HDL, CHOLHDL, VLDL, LDLCALC, LDLDIRECT  Physical Exam:    VS:  BP 130/90   Pulse 69   Temp (!) 97.3 F (36.3 C)   Ht 5\' 10"  (1.778 m)   Wt 204 lb 9.6 oz (92.8 kg)   SpO2 97%   BMI 29.36 kg/m     Wt Readings from Last 3 Encounters:  11/20/19 204 lb 9.6 oz (92.8 kg)  04/23/19 201 lb (91.2 kg)  01/17/18 201 lb (91.2 kg)     GEN:  Well nourished, well developed in no acute distress HEENT: Normal NECK: No JVD; No carotid bruits LYMPHATICS: No lymphadenopathy CARDIAC: RRR, no murmurs, rubs, gallops RESPIRATORY:  Clear to auscultation without rales, wheezing or rhonchi  ABDOMEN: Soft, non-tender, non-distended MUSCULOSKELETAL:  No edema; No deformity  SKIN: Warm and dry NEUROLOGIC:  Alert and oriented x 3 PSYCHIATRIC:  Normal affect   ASSESSMENT:    1. Persistent atrial fibrillation ([place])   2. Essential hypertension    PLAN:    In order of problems listed above:  1. Persistent atrial fibrillation  - no cardiac awareness. He was able to maintain sinus for more than a year after the last cardioversion. After discussing with the patient, his wife and Dr. Stanford Breed, the current plan is to proceed with outpatient cardioversion. He has been compliant with anticoagulation therapy without interuption.   - risk and benefit of the procedure has been discussed with the patient who was agreeable to proceed.   2. HTN: controlled on current therapy.    Medication Adjustments/Labs and Tests Ordered: Current medicines are reviewed at length with the patient today.  Concerns regarding medicines are outlined above.  Orders Placed This Encounter  Procedures  . EKG 12-Lead   No orders of the defined types were placed in this encounter.   Patient Instructions  Medication Instructions:  Not needed *If you need a refill on your cardiac medications before your next appointment, please call your pharmacy*   Lab  Work: Not needed  If you have labs (blood work) drawn today and your tests are completely normal, you will receive your results only by: Marland Kitchen MyChart Message (if you have MyChart) OR . A paper copy in the mail If you have any lab test that is abnormal or we need to change your treatment,  we will call you to review the results.   Testing/Procedures: Not  needed   Follow-Up: At [facility], you and your health needs are our priority.  As part of our continuing mission to provide you with exceptional heart care, we have created designated Provider Care Teams.  These Care Teams include your primary Cardiologist (physician) and Advanced Practice Providers (APPs -  Physician Assistants and Nurse Practitioners) who all work together to provide you with the care you need, when you need it.    Your next appointment:   Will inform you of upcoming appointment once decision is made later today    The format for your next appointment:   In Person  Provider:   Almyra Deforest, PA-C or Dr Kirk Ruths   Other instructions  Will contact you later today @ 1 PM  By phone     Signed, Almyra Deforest, [place]  11/22/2019 11:17 PM    [place]

## 2019-11-22 ENCOUNTER — Encounter: Payer: Self-pay | Admitting: Physician Assistant

## 2019-11-23 ENCOUNTER — Other Ambulatory Visit: Payer: Self-pay | Admitting: Physician Assistant

## 2019-11-24 ENCOUNTER — Other Ambulatory Visit: Payer: Self-pay

## 2019-11-24 ENCOUNTER — Telehealth: Payer: Self-pay

## 2019-11-24 DIAGNOSIS — Z01810 Encounter for preprocedural cardiovascular examination: Secondary | ICD-10-CM

## 2019-11-24 MED ORDER — METOPROLOL TARTRATE 100 MG PO TABS: 100.0000 mg | ORAL_TABLET | Freq: Two times a day (BID) | ORAL | 1 refills | Status: DC

## 2019-11-24 NOTE — Telephone Encounter (Signed)
Called the patient's home number his wife Leonardo Koob answered ans stated that the patient was not home but I could speak with her. I verified that she is on the patient's DPR. I informed Mrs. Butzer that I was following up with her and Mr. Seldon about getting him scheduled for the cardioversion that Almyra Deforest, PA-C spoke with then about. I informed Mrs. Straub is scheduled for 12/11/2019 at 1PM he will need to have labs and a COVID test. I got Mr. Younghans scheduled for 12/08/19 for his COVID test om Monday 12/08/19 and also informed Mrs. Pete that Mr. Crepeau can have his blood drawn on Friday 04/16/202. Mrs. Mundie verbalized an understanding and all (if ny) questions were answered. Patient's wife was also instructed that a letter with all of the mentioned information will be mailed out today.

## 2019-11-24 NOTE — Progress Notes (Signed)
Mailed out patient information about Cardioversion and COIVD test.

## 2019-12-02 ENCOUNTER — Other Ambulatory Visit: Payer: Self-pay

## 2019-12-02 DIAGNOSIS — Z01810 Encounter for preprocedural cardiovascular examination: Secondary | ICD-10-CM

## 2019-12-02 LAB — BASIC METABOLIC PANEL
BUN/Creatinine Ratio: 17 (ref 10–24)
BUN: 15 mg/dL (ref 8–27)
CO2: 23 mmol/L (ref 20–29)
Calcium: 9.2 mg/dL (ref 8.6–10.2)
Chloride: 101 mmol/L (ref 96–106)
Creatinine, Ser: 0.88 mg/dL (ref 0.76–1.27)
GFR calc Af Amer: 91 mL/min/{1.73_m2} (ref >59)
GFR calc non Af Amer: 78 mL/min/{1.73_m2} (ref >59)
Glucose: 95 mg/dL (ref 65–99)
Potassium: 4.6 mmol/L (ref 3.5–5.2)
Sodium: 137 mmol/L (ref 134–144)

## 2019-12-02 LAB — CBC
Hematocrit: 43.5 % (ref 37.5–51.0)
Hemoglobin: 15.1 g/dL (ref 13.0–17.7)
MCH: 33.9 pg — ABNORMAL HIGH (ref 26.6–33.0)
MCHC: 34.7 g/dL (ref 31.5–35.7)
MCV: 98 fL — ABNORMAL HIGH (ref 79–97)
Platelets: 272 10*3/uL (ref 150–450)
RBC: 4.46 x10E6/uL (ref 4.14–5.80)
RDW: 11.9 % (ref 11.6–15.4)
WBC: 5.6 10*3/uL (ref 3.4–10.8)

## 2019-12-05 NOTE — Progress Notes (Signed)
Kidney function and electrolyte ok, red blood cell count normal. Ok to proceed with cardioversion

## 2019-12-08 ENCOUNTER — Other Ambulatory Visit (HOSPITAL_COMMUNITY)
Admission: RE | Admit: 2019-12-08 | Discharge: 2019-12-08 | Disposition: A | Discharge status: Home / Self Care | Payer: Medicare Other | Source: Ambulatory Visit | Attending: Cardiology | Admitting: Cardiology

## 2019-12-08 DIAGNOSIS — Z20822 Contact with and (suspected) exposure to covid-19: Secondary | ICD-10-CM | POA: Insufficient documentation

## 2019-12-08 DIAGNOSIS — Z01812 Encounter for preprocedural laboratory examination: Secondary | ICD-10-CM | POA: Diagnosis present

## 2019-12-08 LAB — SARS CORONAVIRUS 2 (TAT 6-24 HRS): SARS Coronavirus 2: NEGATIVE

## 2019-12-11 ENCOUNTER — Encounter (HOSPITAL_COMMUNITY): Payer: Self-pay | Admitting: Cardiology

## 2019-12-11 ENCOUNTER — Ambulatory Visit (HOSPITAL_COMMUNITY): Payer: Medicare Other | Admitting: Certified Registered Nurse Anesthetist

## 2019-12-11 ENCOUNTER — Other Ambulatory Visit: Payer: Self-pay

## 2019-12-11 ENCOUNTER — Encounter (HOSPITAL_COMMUNITY)
Admission: RE | Disposition: A | Discharge status: Home / Self Care | Payer: Self-pay | Source: Home / Self Care | Attending: Cardiology

## 2019-12-11 ENCOUNTER — Ambulatory Visit (HOSPITAL_COMMUNITY)
Admission: RE | Admit: 2019-12-11 | Discharge: 2019-12-11 | Disposition: A | Discharge status: Home / Self Care | Payer: Medicare Other | Attending: Cardiology | Admitting: Cardiology

## 2019-12-11 DIAGNOSIS — Z79899 Other long term (current) drug therapy: Secondary | ICD-10-CM | POA: Diagnosis not present

## 2019-12-11 DIAGNOSIS — Z7901 Long term (current) use of anticoagulants: Secondary | ICD-10-CM | POA: Diagnosis not present

## 2019-12-11 DIAGNOSIS — I4819 Other persistent atrial fibrillation: Secondary | ICD-10-CM | POA: Insufficient documentation

## 2019-12-11 DIAGNOSIS — J45909 Unspecified asthma, uncomplicated: Secondary | ICD-10-CM | POA: Insufficient documentation

## 2019-12-11 DIAGNOSIS — I10 Essential (primary) hypertension: Secondary | ICD-10-CM | POA: Insufficient documentation

## 2019-12-11 HISTORY — PX: CARDIOVERSION: SHX1299

## 2019-12-11 LAB — POCT I-STAT, CHEM 8
BUN: 15 mg/dL (ref 8–23)
Calcium, Ion: 1.11 mmol/L — ABNORMAL LOW (ref 1.15–1.40)
Chloride: 102 mmol/L (ref 98–111)
Creatinine, Ser: 0.8 mg/dL (ref 0.61–1.24)
Glucose, Bld: 100 mg/dL — ABNORMAL HIGH (ref 70–99)
HCT: 42 % (ref 39.0–52.0)
Hemoglobin: 14.3 g/dL (ref 13.0–17.0)
Potassium: 4.6 mmol/L (ref 3.5–5.1)
Sodium: 140 mmol/L (ref 135–145)
TCO2: 27 mmol/L (ref 22–32)

## 2019-12-11 SURGERY — CARDIOVERSION
Anesthesia: General

## 2019-12-11 MED ORDER — EPHEDRINE SULFATE-NACL 50-0.9 MG/10ML-% IV SOSY
PREFILLED_SYRINGE | INTRAVENOUS | Status: DC | PRN
  Administered 2019-12-11: 5 mg via INTRAVENOUS

## 2019-12-11 MED ORDER — PROPOFOL 10 MG/ML IV BOLUS
INTRAVENOUS | Status: DC | PRN
  Administered 2019-12-11: 60 mg via INTRAVENOUS

## 2019-12-11 MED ORDER — LIDOCAINE 2% (20 MG/ML) 5 ML SYRINGE
INTRAMUSCULAR | Status: DC | PRN
  Administered 2019-12-11: 40 mg via INTRAVENOUS

## 2019-12-11 MED ORDER — SODIUM CHLORIDE 0.9 % IV SOLN: INTRAVENOUS | Status: DC | PRN

## 2019-12-11 NOTE — Procedures (Signed)
Electrical Cardioversion Procedure Note Daniel Yu [id number] 1931-10-12  Procedure: Electrical Cardioversion Indications:  Atrial Fibrillation  Procedure Details Consent: Risks of procedure as well as the alternatives and risks of each were explained to the (patient/caregiver).  Consent for procedure obtained. Time Out: Verified patient identification, verified procedure, site/side was marked, verified correct patient position, special equipment/implants available, medications/allergies/relevent history reviewed, required imaging and test results available.  Performed  Patient placed on cardiac monitor, pulse oximetry, supplemental oxygen as necessary.  Sedation given: Pt sedated by anesthesia with lidocaine 40 mg and diprovan 60 mg IV. Pacer pads placed anterior and posterior chest.  Cardioverted 1 time(s).  Cardioverted at 120J.  Evaluation Findings: Post procedure EKG shows: Sinus bradycardia Complications: None Patient did tolerate procedure well.   Kirk Ruths 12/11/2019, 12:13 PM

## 2019-12-11 NOTE — Anesthesia Preprocedure Evaluation (Addendum)
Anesthesia Evaluation  Patient identified by MRN, date of birth, ID band Patient awake    Reviewed: Allergy & Precautions, NPO status , Patient's Chart, lab work & pertinent test results  Airway Mallampati: I  TM Distance: >3 FB Neck ROM: Full    Dental  (+) Caps, Teeth Intact, Dental Advisory Given   Pulmonary asthma ,    Pulmonary exam normal        Cardiovascular hypertension, +CHF  + dysrhythmias Atrial Fibrillation  Rhythm:Irregular Rate:Bradycardia     Neuro/Psych  Neuromuscular disease negative psych ROS   GI/Hepatic negative GI ROS, Neg liver ROS,   Endo/Other  negative endocrine ROS  Renal/GU negative Renal ROS     Musculoskeletal  (+) Arthritis ,   Abdominal Normal abdominal exam  (+)   Peds  Hematology   Anesthesia Other Findings   Reproductive/Obstetrics                            Echo:  - Left ventricle: The cavity size was normal. There was moderate  concentric hypertrophy. Systolic function was normal. The  estimated ejection fraction was in the range of 50% to 55%. Wall  motion was normal; there were no regional wall motion  abnormalities. The study is not technically sufficient to allow  evaluation of LV diastolic function.  - Aortic valve: There was no regurgitation.  - Mitral valve: There was trivial regurgitation.  - Left atrium: The atrium was severely dilated.  - Right ventricle: The cavity size was normal. Wall thickness was  normal. Systolic function was normal.  - Tricuspid valve: There was mild regurgitation.  - Pulmonary arteries: Systolic pressure was within the normal  range. PA peak pressure: 21 mm Hg (S).  Anesthesia Physical Anesthesia Plan  ASA: III  Anesthesia Plan: General   Post-op Pain Management:    Induction: Intravenous  PONV Risk Score and Plan: 0  Airway Management Planned: Natural Airway and Nasal  Cannula  Additional Equipment: None  Intra-op Plan:   Post-operative Plan:   Informed Consent: I have reviewed the patients History and Physical, chart, labs and discussed the procedure including the risks, benefits and alternatives for the proposed anesthesia with the patient or authorized representative who has indicated his/her understanding and acceptance.       Plan Discussed with: CRNA  Anesthesia Plan Comments:         Anesthesia Quick Evaluation

## 2019-12-11 NOTE — Anesthesia Procedure Notes (Signed)
Procedure Name: General with mask airway Date/Time: 12/11/2019 12:52 PM Performed by: Janene Harvey, CRNA Pre-anesthesia Checklist: Patient identified, Emergency Drugs available, Suction available and Patient being monitored Oxygen Delivery Method: Ambu bag Placement Confirmation: positive ETCO2 Dental Injury: Teeth and Oropharynx as per pre-operative assessment

## 2019-12-11 NOTE — Interval H&P Note (Signed)
History and Physical Interval Note:  12/11/2019 12:14 PM  Daniel Yu  has presented today for surgery, with the diagnosis of AFIB.  The various methods of treatment have been discussed with the patient and family. After consideration of risks, benefits and other options for treatment, the patient has consented to  Procedure(s): CARDIOVERSION (N/A) as a surgical intervention.  The patient's history has been reviewed, patient examined, no change in status, stable for surgery.  I have reviewed the patient's chart and labs.  Questions were answered to the patient's satisfaction.     Kirk Ruths

## 2019-12-11 NOTE — Discharge Instructions (Signed)
Electrical Cardioversion Electrical cardioversion is the delivery of a jolt of electricity to restore a normal rhythm to the heart. A rhythm that is too fast or is not regular keeps the heart from pumping well. In this procedure, sticky patches or metal paddles are placed on the chest to deliver electricity to the heart from a device.  What can I expect after the procedure?  Your blood pressure, heart rate, breathing rate, and blood oxygen level will be monitored until you leave the hospital or clinic.  Your heart rhythm will be watched to make sure it does not change.  You may have some redness on the skin where the shocks were given.  Follow these instructions at home:  Do not drive for 24 hours if you were given a sedative during your procedure.  Take over-the-counter and prescription medicines only as told by your health care provider.  Ask your health care provider how to check your pulse. Check it often.  Rest for 48 hours after the procedure or as told by your health care provider.  Avoid or limit your caffeine use as told by your health care provider.  Keep all follow-up visits as told by your health care provider. This is important.  Contact a health care provider if:  You feel like your heart is beating too quickly or your pulse is not regular.  You have a serious muscle cramp that does not go away.  Get help right away if:  You have discomfort in your chest.  You are dizzy or you feel faint.  You have trouble breathing or you are short of breath.  Your speech is slurred.  You have trouble moving an arm or leg on one side of your body.  Your fingers or toes turn cold or blue.  Summary  Electrical cardioversion is the delivery of a jolt of electricity to restore a normal rhythm to the heart.  This procedure may be done right away in an emergency or may be a scheduled procedure if the condition is not an emergency.  Generally, this is a safe  procedure.  After the procedure, check your pulse often as told by your health care provider.  This information is not intended to replace advice given to you by your health care provider. Make sure you discuss any questions you have with your health care provider. Document Revised: 03/10/2019 Document Reviewed: 03/10/2019 Elsevier Patient Education  2020 [employer]  

## 2019-12-11 NOTE — Transfer of Care (Signed)
Immediate Anesthesia Transfer of Care Note  Patient: Daniel Yu  Procedure(s) Performed: CARDIOVERSION (N/A )  Patient Location: Endoscopy Unit  Anesthesia Type:General  Level of Consciousness: awake  Airway & Oxygen Therapy: Patient Spontanous Breathing and Patient connected to nasal cannula oxygen  Post-op Assessment: Report given to RN and Post -op Vital signs reviewed and stable  Post vital signs: Reviewed  Last Vitals:  Vitals Value Taken Time  BP 129/80 12/11/19 1300  Temp 36.5 C 12/11/19 1300  Pulse 55 12/11/19 1301  Resp 24 12/11/19 1301  SpO2 96 % 12/11/19 1301  Vitals shown include unvalidated device data.  Last Pain:  Vitals:   12/11/19 1300  TempSrc: Oral  PainSc: 0-No pain         Complications: No apparent anesthesia complications

## 2019-12-11 NOTE — Anesthesia Postprocedure Evaluation (Signed)
Anesthesia Post Note  Patient: Daniel Yu  Procedure(s) Performed: CARDIOVERSION (N/A )     Patient location during evaluation: PACU Anesthesia Type: General Level of consciousness: awake and alert Pain management: pain level controlled Vital Signs Assessment: post-procedure vital signs reviewed and stable Respiratory status: spontaneous breathing, nonlabored ventilation, respiratory function stable and patient connected to nasal cannula oxygen Cardiovascular status: blood pressure returned to baseline and stable Postop Assessment: no apparent nausea or vomiting Anesthetic complications: no    Last Vitals:  Vitals:   12/11/19 1310 12/11/19 1320  BP: (!) 98/53 (!) 101/55  Pulse: (!) 52 (!) 51  Resp: 17 13  Temp:    SpO2: 99% 97%    Last Pain:  Vitals:   12/11/19 1320  TempSrc:   PainSc: 0-No pain                 Effie Berkshire

## 2019-12-11 NOTE — H&P (Signed)
Office Visit     11/20/2019  [facility] [place], [place]   Cardiology        Persistent atrial fibrillation ([place]) +1 more   Dx        Follow-up ; Referred by Lavone Orn, MD   Reason for Visit        Additional Documentation   Vitals:       BP 130/90       Pulse 69       Temp 97.3 F (36.3 C)        Ht 5\' 10"  (1.778 m)       Wt 92.8 kg       SpO2 97%       BMI 29.36 kg/m       BSA 2.14 m    Flowsheets:        NEWS,       MEWS Score,       Anthropometrics     Encounter Info:        Billing Info,       History,       Allergies,       Detailed Report            All Notes      Progress Notes by Almyra Deforest, PA at 11/20/2019 8:15 AM   Author: Almyra Deforest, PA Author Type: Physician Assistant Filed: 11/22/2019 11:17 PM  Note Status: Signed Cosign: Cosign Not Required Encounter Date: 11/20/2019  Editor: Almyra Deforest, [place] (Physician Assistant)      Expand AllCollapse All           untitled image   Cardiology Office Note:        Date:  11/22/2019      ID:  Daniel Yu, DOB 1932/01/08, MRN [id number]     PCP:  Lavone Orn, MD            Cardiologist:  Kirk Ruths, MD   Electrophysiologist:  None      Referring MD: Lavone Orn, MD           Chief Complaint    Patient presents with    .   Follow-up            recurrent afib           History of Present Illness:        Daniel Yu is a 86 y.o. male with a hx of atrial fibrillation and hypertension. Patient was diagnosed with atrial fibrillation in June 2018. Echocardiogram at the time showed EF 45 to 50%, mild mitral and tricuspid regurgitation. He was also found to have acute cholecystitis and underwent cholecystectomy. He subsequently underwent TEE guided cardioversion on 02/14/2017 include Myoview in July 2018 showed EF 42%,  no ischemia. Repeat echocardiogram in October 2018 showed normalization of ejection fraction, severe LAE, mild tricuspid regurgitation. Patient was last seen by Dr. Stanford Breed on 04/23/2019 at which time he was doing well. He is on combination of metoprolol, Cardizem and Eliquis.     Patient presents today for cardiology office visit.  Several weeks ago, her wife went to the hospital because of hypotension.  Afterward, they have been checking their blood pressure more often.  Starting on 3/22, he started rechecking his blood pressure medication and noted the blood pressure cuff mentions irregular heartbeat.  He suspect he has went back into atrial fibrillation, however he has  no cardiac awareness of this.  He specifically denied any chest pain, shortness of breath, fatigue or palpitation.  Otherwise he has been very much compliant with rate control agent and also Eliquis.  During the interview today we discussed 2 options in this case. Option #1 would be to proceed with a cardioversion without TEE.  Second possibility is to leave him in atrial fibrillation.  He is aware that the longer he stays in atrial fibrillation the less likely he will ever maintain sinus rhythm.  On the other hand, because he has a history of severely dilated left atrium, possibility of recurrent atrial fibrillation in the future is high.  Since he is completely asymptomatic, I do not think he necessarily need antiarrhythmic therapy.  I told him that both options are very reasonable in this case.  At this time he cannot decide on his preference, I plan to call both his wife and him around 1 PM today to rediscuss the issue and to formulate a plan.     Addendum, I have discussed with the patient who wished to proceed with cardioversion.  We will arrange cardioversion with Dr. Stanford Breed.  He is aware that if this cardioversion fails, the plan would be to rate control in the future.          Past Medical History:    Diagnosis   Date     .   Arthritis            right knee oa    .   Asthma        .   Carpal tunnel syndrome            BILATERAL    .   Cholecystitis        .   Complication of anesthesia            adverse reaction to the spinal    .   Hypertension        .   PAF (paroxysmal atrial fibrillation) ([place])        .   SBO (small bowel obstruction) ([place])   12/2015                Past Surgical History:    Procedure   Laterality   Date    .   CARDIOVERSION   N/A   02/14/2017        Procedure: CARDIOVERSION;  Surgeon: Josue Hector, MD;  Location: [facility] ENDOSCOPY;  Service: Cardiovascular;  Laterality: N/A;    .   CHOLECYSTECTOMY       02/09/2017    .   CHOLECYSTECTOMY   N/A   02/09/2017        Procedure: LAPAROSCOPIC CHOLECYSTECTOMY WITH INTRAOPERATIVE CHOLANGIOGRAM;  Surgeon: Donnie Mesa, MD;  Location: [place];  Service: General;  Laterality: N/A;    .   COLONOSCOPY WITH PROPOFOL   N/A   11/09/2014        Procedure: COLONOSCOPY WITH PROPOFOL;  Surgeon: Garlan Fair, MD;  Location: [facility] ENDOSCOPY;  Service: Endoscopy;  Laterality: N/A;    .   HERNIA REPAIR                2013    .   QUADRICEPS TENDON REPAIR   Left   06/07/2015        Procedure: REPAIR LEFT QUADRICEP TENDON;  Surgeon: Gaynelle Arabian, MD;  Location: [facility]S;  Service: Orthopedics;  Laterality: Left;    .  TEE WITHOUT CARDIOVERSION   N/A   02/14/2017        Procedure: TRANSESOPHAGEAL ECHOCARDIOGRAM (TEE);  Surgeon: Josue Hector, MD;  Location: [facility] ENDOSCOPY;  Service: Cardiovascular;  Laterality: N/A;    .   TOTAL KNEE ARTHROPLASTY   Right   12/27/2015        Procedure: RIGHT TOTAL KNEE ARTHROPLASTY;  Surgeon: Gaynelle Arabian, MD;  Location: [facility]S;  Service: Orthopedics;  Laterality: Right;          Current Medications:  Active Medications                                                                                         Allergies:   Patient has no known allergies.       Social History             Socioeconomic History    .   Marital status:   Married            Spouse name:   Not on file    .   Number of children:   Not on file    .   Years of education:   Not on file    .   Highest education level:   Not on file    Occupational History    .   Not on file    Tobacco Use    .   Smoking status:   Never Smoker    .   Smokeless tobacco:   Never Used    Substance and Sexual Activity    .   Alcohol use:   Yes            Comment:  2 beers daily    .   Drug use:   No    .   Sexual activity:   Not on file    Other Topics   Concern    .   Not on file    Social History Narrative    .   Not on file        Social Determinants of Health           Financial Resource Strain:     .   Difficulty of Paying Living Expenses:     Food Insecurity:     .   Worried About Charity fundraiser in the Last Year:     .   Arboriculturist in the Last Year:     Transportation Needs:     .   Film/video editor (Medical):     Marland Kitchen   Lack of Transportation (Non-Medical):     Physical Activity:     .   Days of Exercise per Week:     .   Minutes of Exercise per Session:     Stress:     .   Feeling of Stress :     Social Connections:     .   Frequency of Communication with Friends and Family:     .   Frequency of Social Gatherings with Friends and Family:     .  Attends Religious Services:     .   Active Member of Clubs or Organizations:     .   Attends Archivist Meetings:     Marland Kitchen   Marital Status:           Family History:  The patient's family history includes Hypertension in his father.     ROS:    Please see the history  of present illness.      All other systems reviewed and are negative.      EKGs/Labs/Other Studies Reviewed:        The following studies were reviewed today:     Myoview 03/08/2017  .The left ventricular ejection fraction is moderately decreased (30-44%).   .Nuclear stress EF: 42%.   .There was no ST segment deviation noted during stress.   .This is an intermediate risk study due to reduced systolic function. No ischemia is noted.            Echo 05/25/2017  LV EF: 50% -   55%  Study Conclusions   - Left ventricle: The cavity size was normal. There was moderate    concentric hypertrophy. Systolic function was normal. The    estimated ejection fraction was in the range of 50% to 55%. Wall    motion was normal; there were no regional wall motion    abnormalities. The study is not technically sufficient to allow    evaluation of LV diastolic function.  - Aortic valve: There was no regurgitation.  - Mitral valve: There was trivial regurgitation.  - Left atrium: The atrium was severely dilated.  - Right ventricle: The cavity size was normal. Wall thickness was    normal. Systolic function was normal.  - Tricuspid valve: There was mild regurgitation.  - Pulmonary arteries: Systolic pressure was within the normal    range. PA peak pressure: 21 mm Hg (S).   Impressions:   - Compared with the echo [id number], systolic function is slightly    improved.         EKG:  EKG is ordered today.  The ekg ordered today demonstrates atrial fibrillation     Recent Labs:  04/23/2019: Hemoglobin 14.6; Platelets 229  11/03/2019: BUN 14; Creatinine, Ser 0.98; Potassium 4.6; Sodium 137   Recent Lipid Panel   Labs (Brief)           Physical Exam:        VS:  BP 130/90   Pulse 69   Temp (!) 97.3 F (36.3 C)   Ht 5\' 10"  (1.778 m)   Wt 204 lb 9.6 oz (92.8 kg)   SpO2 97%   BMI 29.36 kg/m            Wt Readings from Last 3 Encounters:    11/20/19    204 lb 9.6 oz (92.8 kg)    04/23/19   201 lb (91.2 kg)    01/17/18   201 lb (91.2 kg)          GEN:  Well nourished, well developed in no acute distress  HEENT: Normal  NECK: No JVD; No carotid bruits  LYMPHATICS: No lymphadenopathy  CARDIAC: RRR, no murmurs, rubs, gallops  RESPIRATORY:  Clear to auscultation without rales, wheezing or rhonchi   ABDOMEN: Soft, non-tender, non-distended  MUSCULOSKELETAL:  No edema; No deformity   SKIN: Warm and dry  NEUROLOGIC:  Alert and oriented x 3  PSYCHIATRIC:  Normal affect       ASSESSMENT:  1.   Persistent atrial fibrillation ([place])     2.   Essential hypertension         PLAN:        In order of problems listed above:     1.Persistent atrial fibrillation         - no cardiac awareness. He was able to maintain sinus for more than a year after the last cardioversion. After discussing with the patient, his wife and Dr. Stanford Breed, the current plan is to proceed with outpatient cardioversion. He has been compliant with anticoagulation therapy without interuption.         - risk and benefit of the procedure has been discussed with the patient who was agreeable to proceed.      2.HTN: controlled on current therapy.          Medication Adjustments/Labs and Tests Ordered:  Current medicines are reviewed at length with the patient today.  Concerns regarding medicines are outlined above.       Orders Placed This Encounter    Procedures    .   EKG 12-Lead       No orders of the defined types were placed in this encounter.         Patient Instructions    Medication Instructions:   Not needed  *If you need a refill on your cardiac medications before your next appointment, please call your pharmacy*        Lab Work:  Not needed     If you have labs (blood work) drawn today and your tests are completely normal, you will receive your results only  by:  .MyChart Message (if you have MyChart) OR   .A paper copy in the mail   If you have any lab test that is abnormal or we need to change your treatment, we will call you to review the results.        Testing/Procedures:  Not  needed        Follow-Up:  At [facility], you and your health needs are our priority.  As part of our continuing mission to provide you with exceptional heart care, we have created designated Provider Care Teams.  These Care Teams include your primary Cardiologist (physician) and Advanced Practice Providers (APPs -  Physician Assistants and Nurse Practitioners) who all work together to provide you with the care you need, when you need it.           Your next appointment:    Will inform you of upcoming appointment once decision is made later today       The format for your next appointment:    In Person     Provider:    Almyra Deforest, PA-C or Dr Kirk Ruths     For DCCV; compliant with apixaban; no changes Kirk Ruths

## 2020-04-08 ENCOUNTER — Telehealth: Payer: Self-pay | Admitting: *Deleted

## 2020-04-08 NOTE — Telephone Encounter (Signed)
A message was left, re: his follow up visit. 

## 2020-04-21 ENCOUNTER — Other Ambulatory Visit: Payer: Self-pay | Admitting: Cardiology

## 2020-04-23 ENCOUNTER — Other Ambulatory Visit: Payer: Self-pay

## 2020-04-28 ENCOUNTER — Other Ambulatory Visit: Payer: Self-pay

## 2020-04-28 MED ORDER — DILTIAZEM HCL ER COATED BEADS 120 MG PO CP24: 120.0000 mg | ORAL_CAPSULE | Freq: Every day | ORAL | 3 refills | Status: DC

## 2020-05-04 ENCOUNTER — Other Ambulatory Visit: Payer: Self-pay | Admitting: Cardiology

## 2020-05-04 NOTE — Telephone Encounter (Signed)
Please review for refill. Thanks!  

## 2020-05-04 NOTE — Telephone Encounter (Signed)
Pt saw cardiology 11/20/19.  Last labs 12/11/19:  SCr 0.80  Hgb 15.1  Hct 43.5  Plts 272  Weight 92.8kg  Age 86  Based on above results Eliquis dosage of 5mg  bid is appropriate.  Eliquis refill approved.

## 2020-06-19 ENCOUNTER — Ambulatory Visit: Payer: Medicare Other | Attending: Internal Medicine

## 2020-06-19 DIAGNOSIS — Z23 Encounter for immunization: Secondary | ICD-10-CM

## 2020-06-19 NOTE — Progress Notes (Signed)
   Covid-19 Vaccination Clinic  Name:  Daniel Yu    MRN: [id number] DOB: [birth year]  06/19/2020  [name] was observed post Covid-19 immunization for 30 minutes based on pre-vaccination screening without incident. He was provided with Vaccine Information Sheet and instruction to access the V-Safe system.   [name] was instructed to call 911 with any severe reactions post vaccine: Marland Kitchen Difficulty breathing  . Swelling of face and throat  . A fast heartbeat  . A bad rash all over body  . Dizziness and weakness

## 2020-06-23 NOTE — Progress Notes (Signed)
HPI: Follow-up atrial fibrillation. Patient found to have atrial fibrillation in June 2018. Echocardiogram showed ejection fraction 45-50%, mild mitral and tricuspid regurgitation. At that time he was found to have acute cholecystitis and underwent cholecystectomy. Patient had successful TEE guided cardioversion on 02/14/2017. Nuclear study July 2018 showed ejection fraction 42%. There was no ischemia.Echocardiogram repeated October 2018 and showed normal LV function, severe left atrial enlargement and mild tricuspid regurgitation.  Underwent repeat cardioversion April 2021.  Since last seen,he denies dyspnea, chest pain, palpitations, syncope or bleeding.  Current Outpatient Medications  Medication Sig Dispense Refill  . cetirizine (ZYRTEC) 10 MG tablet Take 10 mg by mouth daily.    . cholecalciferol (VITAMIN D3) 25 MCG (1000 UNIT) tablet Take 1,000 Units by mouth daily.    Marland Kitchen diltiazem (CARTIA XT) 120 MG 24 hr capsule Take 1 capsule (120 mg total) by mouth daily. 90 capsule 3  . ELIQUIS 5 MG TABS tablet TAKE 1 TABLET(5 MG) BY MOUTH TWICE DAILY 180 tablet 1  . fluticasone (FLOVENT HFA) 110 MCG/ACT inhaler Inhale 1 puff into the lungs 2 (two) times daily.     . metoprolol tartrate (LOPRESSOR) 100 MG tablet TAKE 1 TABLET(100 MG) BY MOUTH TWICE DAILY 180 tablet 3  . losartan (COZAAR) 50 MG tablet Take 1 tablet (50 mg total) by mouth daily. 90 tablet 3   No current facility-administered medications for this visit.     Past Medical History:  Diagnosis Date  . Arthritis    right knee oa  . Asthma   . Carpal tunnel syndrome    BILATERAL  . Cholecystitis   . Complication of anesthesia    adverse reaction to the spinal  . Hypertension   . PAF (paroxysmal atrial fibrillation) ([place])   . SBO (small bowel obstruction) ([place]) 12/2015    Past Surgical History:  Procedure Laterality Date  . CARDIOVERSION N/A 02/14/2017   Procedure: CARDIOVERSION;  Surgeon: Josue Hector, MD;  Location:  [facility] ENDOSCOPY;  Service: Cardiovascular;  Laterality: N/A;  . CARDIOVERSION N/A 12/11/2019   Procedure: CARDIOVERSION;  Surgeon: Lelon Perla, MD;  Location: [facility] ENDOSCOPY;  Service: Cardiovascular;  Laterality: N/A;  . CHOLECYSTECTOMY  02/09/2017  . CHOLECYSTECTOMY N/A 02/09/2017   Procedure: LAPAROSCOPIC CHOLECYSTECTOMY WITH INTRAOPERATIVE CHOLANGIOGRAM;  Surgeon: Donnie Mesa, MD;  Location: [place];  Service: General;  Laterality: N/A;  . COLONOSCOPY WITH PROPOFOL N/A 11/09/2014   Procedure: COLONOSCOPY WITH PROPOFOL;  Surgeon: Garlan Fair, MD;  Location: [facility] ENDOSCOPY;  Service: Endoscopy;  Laterality: N/A;  . HERNIA REPAIR     2013  . QUADRICEPS TENDON REPAIR Left 06/07/2015   Procedure: REPAIR LEFT QUADRICEP TENDON;  Surgeon: Gaynelle Arabian, MD;  Location: [facility];  Service: Orthopedics;  Laterality: Left;  . TEE WITHOUT CARDIOVERSION N/A 02/14/2017   Procedure: TRANSESOPHAGEAL ECHOCARDIOGRAM (TEE);  Surgeon: Josue Hector, MD;  Location: [facility] ENDOSCOPY;  Service: Cardiovascular;  Laterality: N/A;  . TOTAL KNEE ARTHROPLASTY Right 12/27/2015   Procedure: RIGHT TOTAL KNEE ARTHROPLASTY;  Surgeon: Gaynelle Arabian, MD;  Location: [facility];  Service: Orthopedics;  Laterality: Right;    Social History   Socioeconomic History  . Marital status: Married    Spouse name: Not on file  . Number of children: Not on file  . Years of education: Not on file  . Highest education level: Not on file  Occupational History  . Not on file  Tobacco Use  . Smoking status: Never Smoker  . Smokeless tobacco: Never  Used  Vaping Use  . Vaping Use: Never used  Substance and Sexual Activity  . Alcohol use: Yes    Comment:  2 beers daily  . Drug use: No  . Sexual activity: Not on file  Other Topics Concern  . Not on file  Social History Narrative  . Not on file   Social Determinants of Health   Financial Resource Strain:   . Difficulty of Paying Living Expenses: Not on file  Food Insecurity:   .  Worried About Charity fundraiser in the Last Year: Not on file  . Ran Out of Food in the Last Year: Not on file  Transportation Needs:   . Lack of Transportation (Medical): Not on file  . Lack of Transportation (Non-Medical): Not on file  Physical Activity:   . Days of Exercise per Week: Not on file  . Minutes of Exercise per Session: Not on file  Stress:   . Feeling of Stress : Not on file  Social Connections:   . Frequency of Communication with Friends and Family: Not on file  . Frequency of Social Gatherings with Friends and Family: Not on file  . Attends Religious Services: Not on file  . Active Member of Clubs or Organizations: Not on file  . Attends Archivist Meetings: Not on file  . Marital Status: Not on file  Intimate Partner Violence:   . Fear of Current or Ex-Partner: Not on file  . Emotionally Abused: Not on file  . Physically Abused: Not on file  . Sexually Abused: Not on file    Family History  Problem Relation Age of Onset  . Hypertension Father     ROS: no fevers or chills, productive cough, hemoptysis, dysphasia, odynophagia, melena, hematochezia, dysuria, hematuria, rash, seizure activity, orthopnea, PND, pedal edema, claudication. Remaining systems are negative.  Physical Exam: Well-developed well-nourished in no acute distress.  Skin is warm and dry.  HEENT is normal.  Neck is supple.  Chest is clear to auscultation with normal expansion.  Cardiovascular exam is regular rate and rhythm.  Abdominal exam nontender or distended. No masses palpated. Extremities show no edema. neuro grossly intact  ECG-sinus bradycardia at a rate of 56, no ST changes.  Personally reviewed  A/P  1 paroxysmal atrial fibrillation-continue beta-blocker and calcium blocker for rate control if atrial fibrillation recurs.  Continue apixaban.    2 hypertension-patient's blood pressure is controlled.  Continue present medications and follow.  3 history of elevated  troponin-this occurred at the time of his previous atrial fibrillation and follow-up nuclear study showed no ischemia or infarction.  4 history of cardiomyopathy-LV function has improved on most recent echocardiogram.  Continue present medications including beta-blockade.  Kirk Ruths, MD

## 2020-07-01 ENCOUNTER — Ambulatory Visit (INDEPENDENT_AMBULATORY_CARE_PROVIDER_SITE_OTHER): Payer: Medicare Other | Admitting: Cardiology

## 2020-07-01 ENCOUNTER — Encounter: Payer: Self-pay | Admitting: Cardiology

## 2020-07-01 VITALS — BP 117/82 | HR 56 | Temp 97.0°F | Ht 70.0 in | Wt 200.4 lb

## 2020-07-01 DIAGNOSIS — I10 Essential (primary) hypertension: Secondary | ICD-10-CM | POA: Diagnosis not present

## 2020-07-01 DIAGNOSIS — I4819 Other persistent atrial fibrillation: Secondary | ICD-10-CM | POA: Diagnosis not present

## 2020-07-01 DIAGNOSIS — I428 Other cardiomyopathies: Secondary | ICD-10-CM | POA: Diagnosis not present

## 2020-07-01 NOTE — Patient Instructions (Signed)
°  Follow-Up: °At [facility], you and your health needs are our priority.  As part of our continuing mission to provide you with exceptional heart care, we have created designated Provider Care Teams.  These Care Teams include your primary Cardiologist (physician) and Advanced Practice Providers (APPs -  Physician Assistants and Nurse Practitioners) who all work together to provide you with the care you need, when you need it. ° °We recommend signing up for the patient portal called "MyChart".  Sign up information is provided on this After Visit Summary.  MyChart is used to connect with patients for Virtual Visits (Telemedicine).  Patients are able to view lab/test results, encounter notes, upcoming appointments, etc.  Non-urgent messages can be sent to your provider as well.   °To learn more about what you can do with MyChart, go to [email or web address].   ° °Your next appointment:   °6 month(s) ° °The format for your next appointment:   °In Person ° °Provider:  Brian Crenshaw MD ° °}  ° ° °

## 2020-09-06 DIAGNOSIS — J45909 Unspecified asthma, uncomplicated: Secondary | ICD-10-CM | POA: Diagnosis not present

## 2020-09-06 DIAGNOSIS — I480 Paroxysmal atrial fibrillation: Secondary | ICD-10-CM | POA: Diagnosis not present

## 2020-09-06 DIAGNOSIS — M179 Osteoarthritis of knee, unspecified: Secondary | ICD-10-CM | POA: Diagnosis not present

## 2020-09-06 DIAGNOSIS — I10 Essential (primary) hypertension: Secondary | ICD-10-CM | POA: Diagnosis not present

## 2020-09-15 DIAGNOSIS — T162XXA Foreign body in left ear, initial encounter: Secondary | ICD-10-CM | POA: Diagnosis not present

## 2020-09-15 DIAGNOSIS — H6123 Impacted cerumen, bilateral: Secondary | ICD-10-CM | POA: Diagnosis not present

## 2020-09-22 DIAGNOSIS — S0501XA Injury of conjunctiva and corneal abrasion without foreign body, right eye, initial encounter: Secondary | ICD-10-CM | POA: Diagnosis not present

## 2020-09-23 DIAGNOSIS — S0501XD Injury of conjunctiva and corneal abrasion without foreign body, right eye, subsequent encounter: Secondary | ICD-10-CM | POA: Diagnosis not present

## 2020-10-01 DIAGNOSIS — M25562 Pain in left knee: Secondary | ICD-10-CM | POA: Diagnosis not present

## 2020-10-01 DIAGNOSIS — M545 Low back pain, unspecified: Secondary | ICD-10-CM | POA: Diagnosis not present

## 2020-10-01 DIAGNOSIS — M25561 Pain in right knee: Secondary | ICD-10-CM | POA: Diagnosis not present

## 2020-10-01 DIAGNOSIS — R2689 Other abnormalities of gait and mobility: Secondary | ICD-10-CM | POA: Diagnosis not present

## 2020-10-03 ENCOUNTER — Other Ambulatory Visit: Payer: Self-pay | Admitting: Cardiology

## 2020-10-07 DIAGNOSIS — M25562 Pain in left knee: Secondary | ICD-10-CM | POA: Diagnosis not present

## 2020-10-07 DIAGNOSIS — M25561 Pain in right knee: Secondary | ICD-10-CM | POA: Diagnosis not present

## 2020-10-07 DIAGNOSIS — R2689 Other abnormalities of gait and mobility: Secondary | ICD-10-CM | POA: Diagnosis not present

## 2020-10-07 DIAGNOSIS — M545 Low back pain, unspecified: Secondary | ICD-10-CM | POA: Diagnosis not present

## 2020-10-11 DIAGNOSIS — H26491 Other secondary cataract, right eye: Secondary | ICD-10-CM | POA: Diagnosis not present

## 2020-10-11 DIAGNOSIS — H52203 Unspecified astigmatism, bilateral: Secondary | ICD-10-CM | POA: Diagnosis not present

## 2020-10-11 DIAGNOSIS — H43813 Vitreous degeneration, bilateral: Secondary | ICD-10-CM | POA: Diagnosis not present

## 2020-10-11 DIAGNOSIS — Z961 Presence of intraocular lens: Secondary | ICD-10-CM | POA: Diagnosis not present

## 2020-10-16 ENCOUNTER — Other Ambulatory Visit: Payer: Self-pay | Admitting: Cardiology

## 2020-11-11 DIAGNOSIS — M179 Osteoarthritis of knee, unspecified: Secondary | ICD-10-CM | POA: Diagnosis not present

## 2020-11-11 DIAGNOSIS — I10 Essential (primary) hypertension: Secondary | ICD-10-CM | POA: Diagnosis not present

## 2020-11-11 DIAGNOSIS — J45909 Unspecified asthma, uncomplicated: Secondary | ICD-10-CM | POA: Diagnosis not present

## 2020-11-11 DIAGNOSIS — I480 Paroxysmal atrial fibrillation: Secondary | ICD-10-CM | POA: Diagnosis not present

## 2020-11-26 DIAGNOSIS — J45909 Unspecified asthma, uncomplicated: Secondary | ICD-10-CM | POA: Diagnosis not present

## 2020-11-26 DIAGNOSIS — I10 Essential (primary) hypertension: Secondary | ICD-10-CM | POA: Diagnosis not present

## 2020-12-08 ENCOUNTER — Other Ambulatory Visit (HOSPITAL_BASED_OUTPATIENT_CLINIC_OR_DEPARTMENT_OTHER): Payer: Self-pay

## 2020-12-08 ENCOUNTER — Other Ambulatory Visit: Payer: Self-pay

## 2020-12-08 ENCOUNTER — Ambulatory Visit: Payer: Medicare Other | Attending: Internal Medicine

## 2020-12-08 DIAGNOSIS — Z23 Encounter for immunization: Secondary | ICD-10-CM

## 2020-12-08 MED ORDER — PFIZER-BIONT COVID-19 VAC-TRIS 30 MCG/0.3ML IM SUSP
INTRAMUSCULAR | 0 refills | Status: DC
  Filled 2020-12-08: qty 0.3, 1d supply, fill #0

## 2020-12-08 NOTE — Progress Notes (Signed)
   Covid-19 Vaccination Clinic  Name:  Daniel Yu    MRN: [id number] DOB: 1932-08-21  12/08/2020  [name] was observed post Covid-19 immunization for 15 minutes without incident. He was provided with Vaccine Information Sheet and instruction to access the V-Safe system.   [name] was instructed to call 911 with any severe reactions post vaccine: Marland Kitchen Difficulty breathing  . Swelling of face and throat  . A fast heartbeat  . A bad rash all over body  . Dizziness and weakness   Immunizations Administered    Name Date Dose VIS Date Route   PFIZER Comrnaty(Gray TOP) Covid-19 Vaccine 12/08/2020  3:51 PM 0.3 mL 07/29/2020 Intramuscular   Manufacturer: [place]   Lot: [id number]   NDC: [phone]

## 2020-12-09 DIAGNOSIS — H26491 Other secondary cataract, right eye: Secondary | ICD-10-CM | POA: Diagnosis not present

## 2020-12-17 ENCOUNTER — Other Ambulatory Visit (HOSPITAL_BASED_OUTPATIENT_CLINIC_OR_DEPARTMENT_OTHER): Payer: Self-pay

## 2021-03-24 ENCOUNTER — Telehealth: Payer: Self-pay | Admitting: Cardiology

## 2021-03-24 NOTE — Telephone Encounter (Signed)
Pt c/o Shortness Of Breath: STAT if SOB developed within the last 24 hours or pt is noticeably SOB on the phone  1. Are you currently SOB (can you hear that pt is SOB on the phone)? NO not right now  2. How long have you been experiencing SOB? It is his first time   3. Are you SOB when sitting or when up moving around? While walking  4. Are you currently experiencing any other symptoms? No

## 2021-03-24 NOTE — Telephone Encounter (Signed)
[id number] 

## 2021-03-24 NOTE — Telephone Encounter (Signed)
Spoke with the patient's wife who states that they were at the [employer] today and were walking up the hill and the patient became extremely short of breath. The patient's wife stated that it scared her. The patient sat down for a bit and felt better. He got up again to walk and developed some more shortness of breath. He finally sat down in some shade and rested for a while and symptoms resolved. He did not any chest pain or additional symptoms.  The patient's wife states that this has never happened before. She states that the patient has been a bit more sedentary recently although they did join [employer] and have been going there some. She states that he currently is feeling good with no symptoms. He does not have any swelling. She states that he has gained about 10 pounds over several months. Patient is scheduled to see Dr. Stanford Breed on 8/12.  Advised that the patient stay hydrated and not stay out in the heat for extended periods of time. Advised that I would make Dr. Stanford Breed aware and we would let them know if he had any recommendation prior to his office visit. They will call back if symptoms return or new symptoms develop.

## 2021-03-30 NOTE — Progress Notes (Signed)
HPI: Follow-up atrial fibrillation. Patient found to have atrial fibrillation in June 2018. Echocardiogram showed ejection fraction 45-50%, mild mitral and tricuspid regurgitation. At that time he was found to have acute cholecystitis and underwent cholecystectomy. Patient had successful TEE guided cardioversion on 02/14/2017. Nuclear study July 2018 showed ejection fraction 42%. There was no ischemia. Echocardiogram repeated October 2018 and showed normal LV function, severe left atrial enlargement and mild tricuspid regurgitation.  Underwent repeat cardioversion April 2021.  Since last seen, patient did have an episode of dyspnea on exertion at the [employer]  However since that time he is exercising routinely riding the stationary bicycle, using weight machines and doing water aerobics.  He denies dyspnea on exertion, orthopnea, PND, pedal edema, palpitations, syncope, chest pain, bleeding or fatigue.  Current Outpatient Medications  Medication Sig Dispense Refill   cetirizine (ZYRTEC) 10 MG tablet Take 10 mg by mouth daily.     cholecalciferol (VITAMIN D3) 25 MCG (1000 UNIT) tablet Take 1,000 Units by mouth daily.     COVID-19 mRNA Vac-TriS, Pfizer, (PFIZER-BIONT COVID-19 VAC-TRIS) SUSP injection Inject into the muscle. 0.3 mL 0   diltiazem (CARTIA XT) 120 MG 24 hr capsule Take 1 capsule (120 mg total) by mouth daily. 90 capsule 3   ELIQUIS 5 MG TABS tablet TAKE 1 TABLET(5 MG) BY MOUTH TWICE DAILY 180 tablet 1   fluticasone (FLOVENT HFA) 110 MCG/ACT inhaler Inhale 1 puff into the lungs 2 (two) times daily.      losartan (COZAAR) 50 MG tablet TAKE 1 TABLET(50 MG) BY MOUTH DAILY 90 tablet 3   metoprolol tartrate (LOPRESSOR) 100 MG tablet TAKE 1 TABLET(100 MG) BY MOUTH TWICE DAILY 180 tablet 3   No current facility-administered medications for this visit.     Past Medical History:  Diagnosis Date   Arthritis    right knee oa   Asthma    Carpal tunnel syndrome     BILATERAL   Cholecystitis    Complication of anesthesia    adverse reaction to the spinal   Hypertension    PAF (paroxysmal atrial fibrillation) (HCC)    SBO (small bowel obstruction) ([place]) 12/2015    Past Surgical History:  Procedure Laterality Date   CARDIOVERSION N/A 02/14/2017   Procedure: CARDIOVERSION;  Surgeon: Josue Hector, MD;  Location: [place];  Service: Cardiovascular;  Laterality: N/A;   CARDIOVERSION N/A 12/11/2019   Procedure: CARDIOVERSION;  Surgeon: Lelon Perla, MD;  Location: [facility] ENDOSCOPY;  Service: Cardiovascular;  Laterality: N/A;   CHOLECYSTECTOMY  02/09/2017   CHOLECYSTECTOMY N/A 02/09/2017   Procedure: LAPAROSCOPIC CHOLECYSTECTOMY WITH INTRAOPERATIVE CHOLANGIOGRAM;  Surgeon: Donnie Mesa, MD;  Location: [place];  Service: General;  Laterality: N/A;   COLONOSCOPY WITH PROPOFOL N/A 11/09/2014   Procedure: COLONOSCOPY WITH PROPOFOL;  Surgeon: Garlan Fair, MD;  [facility];  Service: Endoscopy;  Laterality: N/A;   HERNIA REPAIR     2013   QUADRICEPS TENDON REPAIR Left 06/07/2015   Procedure: REPAIR LEFT QUADRICEP TENDON;  Surgeon: Gaynelle Arabian, MD;  [facility];  Service: Orthopedics;  Laterality: Left;   TEE WITHOUT CARDIOVERSION N/A 02/14/2017   Procedure: TRANSESOPHAGEAL ECHOCARDIOGRAM (TEE);  Surgeon: Josue Hector, MD;  Location: [facility] ENDOSCOPY;  Service: Cardiovascular;  Laterality: N/A;   TOTAL KNEE ARTHROPLASTY Right 12/27/2015   Procedure: RIGHT TOTAL KNEE ARTHROPLASTY;  Surgeon: Gaynelle Arabian, MD;  [facility];  Service: Orthopedics;  Laterality: Right;    Social History   Socioeconomic  History   Marital status: Married    Spouse name: Not on file   Number of children: Not on file   Years of education: Not on file   Highest education level: Not on file  Occupational History   Not on file  Tobacco Use   Smoking status: Never   Smokeless tobacco: Never  Vaping Use   Vaping Use: Never used  Substance and Sexual  Activity   Alcohol use: Yes    Comment:  2 beers daily   Drug use: No   Sexual activity: Not on file  Other Topics Concern   Not on file  Social History Narrative   Not on file   Social Determinants of Health   Financial Resource Strain: Not on file  Food Insecurity: Not on file  Transportation Needs: Not on file  Physical Activity: Not on file  Stress: Not on file  Social Connections: Not on file  Intimate Partner Violence: Not on file    Family History  Problem Relation Age of Onset   Hypertension Father     ROS: no fevers or chills, productive cough, hemoptysis, dysphasia, odynophagia, melena, hematochezia, dysuria, hematuria, rash, seizure activity, orthopnea, PND, pedal edema, claudication. Remaining systems are negative.  Physical Exam: Well-developed well-nourished in no acute distress.  Skin is warm and dry.  HEENT is normal.  Neck is supple.  Chest is clear to auscultation with normal expansion.  Cardiovascular exam is irregular Abdominal exam nontender or distended. No masses palpated. Extremities show no edema. neuro grossly intact  ECG-atrial fibrillation at a rate of 55, no ST changes.  Personally reviewed  A/P  1 paroxysmal atrial fibrillation-patient has developed recurrent atrial fibrillation.  Timing of this is unclear as he is essentially asymptomatic.  I discussed rate control versus rhythm control today and given that he has no symptoms I favor rate control.  He is in agreement.  His heart rate is mildly decreased.  Decrease metoprolol to 75 mg twice daily.  Continue Cardizem.  We will follow heart rate and adjust as needed.  Continue apixaban.  Repeat echocardiogram.  I will see him back in 3 months to make sure that he remains asymptomatic.  If not we could consider cardioversion with addition of antiarrhythmic.  2 Hypertension-blood pressure controlled.  I am decreasing metoprolol due to mild bradycardia.  Follow and adjust medications as  needed.  3 history of elevated troponin-occurred at the time of previous atrial fibrillation.  Follow-up nuclear study showed no ischemia.  No plans for further evaluation.  4 cardiomyopathy-patient does have a history of cardiomyopathy but improved on most recent echocardiogram.  Kirk Ruths, MD

## 2021-04-01 ENCOUNTER — Encounter: Payer: Self-pay | Admitting: Cardiology

## 2021-04-01 ENCOUNTER — Other Ambulatory Visit: Payer: Self-pay

## 2021-04-01 ENCOUNTER — Ambulatory Visit: Payer: Medicare Other | Admitting: Cardiology

## 2021-04-01 VITALS — BP 120/72 | HR 55 | Ht 70.0 in | Wt 200.2 lb

## 2021-04-01 DIAGNOSIS — I4819 Other persistent atrial fibrillation: Secondary | ICD-10-CM | POA: Diagnosis not present

## 2021-04-01 DIAGNOSIS — I428 Other cardiomyopathies: Secondary | ICD-10-CM

## 2021-04-01 DIAGNOSIS — I10 Essential (primary) hypertension: Secondary | ICD-10-CM | POA: Diagnosis not present

## 2021-04-01 LAB — CBC
Hematocrit: 41.3 % (ref 37.5–51.0)
Hemoglobin: 13.9 g/dL (ref 13.0–17.7)
MCH: 33.9 pg — ABNORMAL HIGH (ref 26.6–33.0)
MCHC: 33.7 g/dL (ref 31.5–35.7)
MCV: 101 fL — ABNORMAL HIGH (ref 79–97)
Platelets: 213 10*3/uL (ref 150–450)
RBC: 4.1 x10E6/uL — ABNORMAL LOW (ref 4.14–5.80)
RDW: 13.1 % (ref 11.6–15.4)
WBC: 5.6 10*3/uL (ref 3.4–10.8)

## 2021-04-01 LAB — BASIC METABOLIC PANEL
BUN/Creatinine Ratio: 15 (ref 10–24)
BUN: 15 mg/dL (ref 8–27)
CO2: 24 mmol/L (ref 20–29)
Calcium: 9.3 mg/dL (ref 8.6–10.2)
Chloride: 98 mmol/L (ref 96–106)
Creatinine, Ser: 0.98 mg/dL (ref 0.76–1.27)
Glucose: 96 mg/dL (ref 65–99)
Potassium: 4.6 mmol/L (ref 3.5–5.2)
Sodium: 135 mmol/L (ref 134–144)
eGFR: 75 mL/min/{1.73_m2} (ref >59)

## 2021-04-01 MED ORDER — METOPROLOL TARTRATE 75 MG PO TABS: 75.0000 mg | ORAL_TABLET | Freq: Two times a day (BID) | ORAL | 3 refills | Status: DC

## 2021-04-01 NOTE — Patient Instructions (Signed)
Medication Instructions:   DECREASE METOPROLOL TO 75 MG TWICE DAILY  *If you need a refill on your cardiac medications before your next appointment, please call your pharmacy*   Lab Work:  Your physician recommends that you HAVE LAB WORK TODAY  If you have labs (blood work) drawn today and your tests are completely normal, you will receive your results only by: [place] (if you have MyChart) OR A paper copy in the mail If you have any lab test that is abnormal or we need to change your treatment, we will call you to review the results.   Testing/Procedures:  Your physician has requested that you have an echocardiogram. Echocardiography is a painless test that uses sound waves to create images of your heart. It provides your doctor with information about the size and shape of your heart and how well your heart's chambers and valves are working. This procedure takes approximately one hour. There are no restrictions for this procedure. [place]   Follow-Up: At [facility], you and your health needs are our priority.  As part of our continuing mission to provide you with exceptional heart care, we have created designated Provider Care Teams.  These Care Teams include your primary Cardiologist (physician) and Advanced Practice Providers (APPs -  Physician Assistants and Nurse Practitioners) who all work together to provide you with the care you need, when you need it.  We recommend signing up for the patient portal called "MyChart".  Sign up information is provided on this After Visit Summary.  MyChart is used to connect with patients for Virtual Visits (Telemedicine).  Patients are able to view lab/test results, encounter notes, upcoming appointments, etc.  Non-urgent messages can be sent to your provider as well.   To learn more about what you can do with MyChart, go to [email or web address].    Your next appointment:   3 month(s)  The format for your next  appointment:   In Person  Provider:   Kirk Ruths, MD

## 2021-04-08 ENCOUNTER — Encounter: Payer: Self-pay | Admitting: *Deleted

## 2021-04-14 ENCOUNTER — Other Ambulatory Visit: Payer: Self-pay | Admitting: Cardiology

## 2021-04-14 NOTE — Telephone Encounter (Signed)
Prescription refill request for Eliquis received. Indication:afib Last office visit:[place] Scr:0.98 04/01/21 Age: 40mWeight:90.8kg

## 2021-04-14 NOTE — Telephone Encounter (Signed)
Metoprolol Tartrate 100 mg BID sent to pharmacy. Eliquis routed to pharmacy for review and refill if appropriate.

## 2021-04-18 ENCOUNTER — Other Ambulatory Visit: Payer: Self-pay

## 2021-04-18 MED ORDER — DILTIAZEM HCL ER COATED BEADS 120 MG PO CP24
120.0000 mg | ORAL_CAPSULE | Freq: Every day | ORAL | 3 refills | Status: DC
Start: 2021-04-18 — End: 2022-01-18

## 2021-04-21 ENCOUNTER — Ambulatory Visit (HOSPITAL_COMMUNITY): Payer: Medicare Other | Attending: Cardiology

## 2021-04-21 ENCOUNTER — Other Ambulatory Visit: Payer: Self-pay

## 2021-04-21 DIAGNOSIS — I4819 Other persistent atrial fibrillation: Secondary | ICD-10-CM | POA: Insufficient documentation

## 2021-04-21 LAB — ECHOCARDIOGRAM COMPLETE
MV M vel: 4.2 m/s
MV Peak grad: 70.6 mmHg
S' Lateral: 3.3 cm

## 2021-05-06 DIAGNOSIS — I10 Essential (primary) hypertension: Secondary | ICD-10-CM | POA: Diagnosis not present

## 2021-05-06 DIAGNOSIS — I480 Paroxysmal atrial fibrillation: Secondary | ICD-10-CM | POA: Diagnosis not present

## 2021-05-06 DIAGNOSIS — M179 Osteoarthritis of knee, unspecified: Secondary | ICD-10-CM | POA: Diagnosis not present

## 2021-05-06 DIAGNOSIS — J45909 Unspecified asthma, uncomplicated: Secondary | ICD-10-CM | POA: Diagnosis not present

## 2021-06-02 DIAGNOSIS — J45909 Unspecified asthma, uncomplicated: Secondary | ICD-10-CM | POA: Diagnosis not present

## 2021-06-02 DIAGNOSIS — I480 Paroxysmal atrial fibrillation: Secondary | ICD-10-CM | POA: Diagnosis not present

## 2021-06-02 DIAGNOSIS — I10 Essential (primary) hypertension: Secondary | ICD-10-CM | POA: Diagnosis not present

## 2021-06-02 DIAGNOSIS — Z23 Encounter for immunization: Secondary | ICD-10-CM | POA: Diagnosis not present

## 2021-06-02 DIAGNOSIS — Z1389 Encounter for screening for other disorder: Secondary | ICD-10-CM | POA: Diagnosis not present

## 2021-06-02 DIAGNOSIS — Z0000 Encounter for general adult medical examination without abnormal findings: Secondary | ICD-10-CM | POA: Diagnosis not present

## 2021-06-02 DIAGNOSIS — I5189 Other ill-defined heart diseases: Secondary | ICD-10-CM | POA: Diagnosis not present

## 2021-07-01 NOTE — Progress Notes (Signed)
HPI: Follow-up atrial fibrillation. Patient found to have atrial fibrillation in June 2018. Echocardiogram showed ejection fraction 45-50%, mild mitral and tricuspid regurgitation. At that time he was found to have acute cholecystitis and underwent cholecystectomy. Patient had successful TEE guided cardioversion on 02/14/2017. Nuclear study July 2018 showed ejection fraction 42%. There was no ischemia. Underwent repeat cardioversion April 2021.  Echocardiogram September 2022 showed normal LV function, mild right ventricular enlargement, mild RV dysfunction, biatrial enlargement, mild to moderate mitral regurgitation.  Since last seen, he had 1 episode of dyspnea with [place] but otherwise denies dyspnea on exertion, orthopnea, PND, pedal edema, chest pain, palpitations or syncope.  Current Outpatient Medications  Medication Sig Dispense Refill   cetirizine (ZYRTEC) 10 MG tablet Take 10 mg by mouth daily.     cholecalciferol (VITAMIN D3) 25 MCG (1000 UNIT) tablet Take 1,000 Units by mouth daily.     COVID-19 mRNA bivalent vaccine, Pfizer, (PFIZER COVID-19 VAC BIVALENT) injection Inject into the muscle. 0.3 mL 0   COVID-19 mRNA Vac-TriS, Pfizer, (PFIZER-BIONT COVID-19 VAC-TRIS) SUSP injection Inject into the muscle. 0.3 mL 0   diltiazem (CARTIA XT) 120 MG 24 hr capsule Take 1 capsule (120 mg total) by mouth daily. 90 capsule 3   ELIQUIS 5 MG TABS tablet TAKE 1 TABLET(5 MG) BY MOUTH TWICE DAILY 180 tablet 1   fluticasone (FLOVENT HFA) 110 MCG/ACT inhaler Inhale 1 puff into the lungs 2 (two) times daily.      losartan (COZAAR) 50 MG tablet TAKE 1 TABLET(50 MG) BY MOUTH DAILY 90 tablet 3   metoprolol tartrate 75 MG TABS Take 75 mg by mouth 2 (two) times daily. 180 tablet 3   No current facility-administered medications for this visit.     Past Medical History:  Diagnosis Date   Arthritis    right knee oa   Asthma    Carpal tunnel syndrome    BILATERAL    Cholecystitis    Complication of anesthesia    adverse reaction to the spinal   Hypertension    PAF (paroxysmal atrial fibrillation) (HCC)    SBO (small bowel obstruction) ([place]) 12/2015    Past Surgical History:  Procedure Laterality Date   CARDIOVERSION N/A 02/14/2017   Procedure: CARDIOVERSION;  Surgeon: Josue Hector, MD;  Location: [place];  Service: Cardiovascular;  Laterality: N/A;   CARDIOVERSION N/A 12/11/2019   Procedure: CARDIOVERSION;  Surgeon: Lelon Perla, MD;  Location[facility] ENDOSCOPY;  Service: Cardiovascular;  Laterality: N/A;   CHOLECYSTECTOMY  02/09/2017   CHOLECYSTECTOMY N/A 02/09/2017   Procedure: LAPAROSCOPIC CHOLECYSTECTOMY WITH INTRAOPERATIVE CHOLANGIOGRAM;  Surgeon: Donnie Mesa, MD;  Location: [place];  Service: General;  Laterality: N/A;   COLONOSCOPY WITH PROPOFOL N/A 11/09/2014   Procedure: COLONOSCOPY WITH PROPOFOL;  Surgeon: Garlan Fair, MD;  [facility];  Service: Endoscopy;  Laterality: N/A;   HERNIA REPAIR     2013   QUADRICEPS TENDON REPAIR Left 06/07/2015   Procedure: REPAIR LEFT QUADRICEP TENDON;  Surgeon: Gaynelle Arabian, MD;  [facility];  Service: Orthopedics;  Laterality: Left;   TEE WITHOUT CARDIOVERSION N/A 02/14/2017   Procedure: TRANSESOPHAGEAL ECHOCARDIOGRAM (TEE);  Surgeon: Josue Hector, MD;  Location: [facility] ENDOSCOPY;  Service: Cardiovascular;  Laterality: N/A;   TOTAL KNEE ARTHROPLASTY Right 12/27/2015   Procedure: RIGHT TOTAL KNEE ARTHROPLASTY;  Surgeon: Gaynelle Arabian, MD;  [facility];  Service: Orthopedics;  Laterality: Right;    Social History  Socioeconomic History   Marital status: Married    Spouse name: Not on file   Number of children: Not on file   Years of education: Not on file   Highest education level: Not on file  Occupational History   Not on file  Tobacco Use   Smoking status: Never   Smokeless tobacco: Never  Vaping Use   Vaping Use: Never used  Substance and Sexual Activity    Alcohol use: Yes    Comment:  2 beers daily   Drug use: No   Sexual activity: Not on file  Other Topics Concern   Not on file  Social History Narrative   Not on file   Social Determinants of Health   Financial Resource Strain: Not on file  Food Insecurity: Not on file  Transportation Needs: Not on file  Physical Activity: Not on file  Stress: Not on file  Social Connections: Not on file  Intimate Partner Violence: Not on file    Family History  Problem Relation Age of Onset   Hypertension Father     ROS: no fevers or chills, productive cough, hemoptysis, dysphasia, odynophagia, melena, hematochezia, dysuria, hematuria, rash, seizure activity, orthopnea, PND, pedal edema, claudication. Remaining systems are negative.  Physical Exam: Well-developed well-nourished in no acute distress.  Skin is warm and dry.  HEENT is normal.  Neck is supple.  Chest is clear to auscultation with normal expansion.  Cardiovascular exam is irregular and mildly bradycardic Abdominal exam nontender or distended. No masses palpated. Extremities show no edema. neuro grossly intact  ECG-atrial fibrillation with heart rate of 51, normal axis, no ST changes.  Personally reviewed  A/P  1 paroxysmal atrial fibrillation-patient remains in atrial fibrillation but is asymptomatic.  We will continue Cardizem and metoprolol for rate control.  Note I will decrease metoprolol to 50 mg twice daily as his heart rate is mildly decreased.  Continue apixaban.  Plan will be rate control and anticoagulation long-term.  2 hypertension-patient's blood pressure is controlled.  Continue present medications and follow.  3 history of elevated troponin-as outlined in previous notes this occurred at the time of previous atrial fibrillation and follow-up nuclear study showed no ischemia.  4 history of cardiomyopathy-LV function has normalized on most recent echocardiogram.  Kirk Ruths, MD

## 2021-07-05 ENCOUNTER — Ambulatory Visit: Payer: Medicare Other | Attending: Internal Medicine

## 2021-07-05 ENCOUNTER — Other Ambulatory Visit (HOSPITAL_BASED_OUTPATIENT_CLINIC_OR_DEPARTMENT_OTHER): Payer: Self-pay

## 2021-07-05 DIAGNOSIS — Z23 Encounter for immunization: Secondary | ICD-10-CM

## 2021-07-05 MED ORDER — PFIZER COVID-19 VAC BIVALENT 30 MCG/0.3ML IM SUSP
INTRAMUSCULAR | 0 refills | Status: DC
  Filled 2021-07-05: qty 0.3, 1d supply, fill #0

## 2021-07-05 NOTE — Progress Notes (Signed)
   Covid-19 Vaccination Clinic  Name:  Daniel Yu    MRN: [id number] DOB: 1932-03-18  07/05/2021  [name] was observed post Covid-19 immunization for 15 minutes without incident. He was provided with Vaccine Information Sheet and instruction to access the V-Safe system.   [name] was instructed to call 911 with any severe reactions post vaccine: Difficulty breathing  Swelling of face and throat  A fast heartbeat  A bad rash all over body  Dizziness and weakness   Immunizations Administered     Name Date Dose VIS Date Route   Pfizer Covid-19 Vaccine Bivalent Booster 07/05/2021 12:28 PM 0.3 mL 04/20/2021 Intramuscular   Manufacturer: [place]   Lot: [id number]   [place]: [phone]

## 2021-07-07 ENCOUNTER — Other Ambulatory Visit: Payer: Self-pay

## 2021-07-07 ENCOUNTER — Ambulatory Visit: Payer: Medicare Other | Admitting: Cardiology

## 2021-07-07 ENCOUNTER — Encounter: Payer: Self-pay | Admitting: Cardiology

## 2021-07-07 VITALS — BP 118/70 | HR 51 | Ht 70.0 in | Wt 200.0 lb

## 2021-07-07 DIAGNOSIS — I4819 Other persistent atrial fibrillation: Secondary | ICD-10-CM

## 2021-07-07 DIAGNOSIS — I10 Essential (primary) hypertension: Secondary | ICD-10-CM | POA: Diagnosis not present

## 2021-07-07 DIAGNOSIS — I428 Other cardiomyopathies: Secondary | ICD-10-CM

## 2021-07-07 MED ORDER — METOPROLOL TARTRATE 50 MG PO TABS: 50.0000 mg | ORAL_TABLET | Freq: Two times a day (BID) | ORAL | 3 refills | Status: DC

## 2021-07-07 NOTE — Patient Instructions (Signed)
Medication Instructions:   REDUCE METOPROLOL TO 50 MG TWICE DAILY  *If you need a refill on your cardiac medications before your next appointment, please call your pharmacy*   Follow-Up: At [facility], you and your health needs are our priority.  As part of our continuing mission to provide you with exceptional heart care, we have created designated Provider Care Teams.  These Care Teams include your primary Cardiologist (physician) and Advanced Practice Providers (APPs -  Physician Assistants and Nurse Practitioners) who all work together to provide you with the care you need, when you need it.  We recommend signing up for the patient portal called "MyChart".  Sign up information is provided on this After Visit Summary.  MyChart is used to connect with patients for Virtual Visits (Telemedicine).  Patients are able to view lab/test results, encounter notes, upcoming appointments, etc.  Non-urgent messages can be sent to your provider as well.   To learn more about what you can do with MyChart, go to [email or web address].    Your next appointment:   6 month(s)  The format for your next appointment:   In Person  Provider:   Kirk Ruths, MD

## 2021-08-04 DIAGNOSIS — J45909 Unspecified asthma, uncomplicated: Secondary | ICD-10-CM | POA: Diagnosis not present

## 2021-08-04 DIAGNOSIS — I480 Paroxysmal atrial fibrillation: Secondary | ICD-10-CM | POA: Diagnosis not present

## 2021-08-04 DIAGNOSIS — I10 Essential (primary) hypertension: Secondary | ICD-10-CM | POA: Diagnosis not present

## 2021-09-22 ENCOUNTER — Other Ambulatory Visit: Payer: Self-pay | Admitting: Cardiology

## 2021-10-27 ENCOUNTER — Other Ambulatory Visit: Payer: Self-pay | Admitting: Cardiology

## 2021-10-27 NOTE — Telephone Encounter (Signed)
Prescription refill request for Eliquis received. ?Indication:Afib ?Last office visit:11/22 ?Scr:0.9 ?Age: 88 ?Weight:90.7 kg ? ?Prescription refilled ? ?

## 2021-11-14 DIAGNOSIS — I480 Paroxysmal atrial fibrillation: Secondary | ICD-10-CM | POA: Diagnosis not present

## 2021-11-14 DIAGNOSIS — I10 Essential (primary) hypertension: Secondary | ICD-10-CM | POA: Diagnosis not present

## 2021-11-28 DIAGNOSIS — I10 Essential (primary) hypertension: Secondary | ICD-10-CM | POA: Diagnosis not present

## 2021-12-19 DIAGNOSIS — Z961 Presence of intraocular lens: Secondary | ICD-10-CM | POA: Diagnosis not present

## 2021-12-19 DIAGNOSIS — H524 Presbyopia: Secondary | ICD-10-CM | POA: Diagnosis not present

## 2022-01-05 ENCOUNTER — Telehealth: Payer: Self-pay | Admitting: Cardiology

## 2022-01-05 NOTE — Telephone Encounter (Signed)
Pt c/o Shortness Of Breath: STAT if SOB developed within the last 24 hours or pt is noticeably SOB on the phone  1. Are you currently SOB (can you hear that pt is SOB on the phone)? No  2. How long have you been experiencing SOB? First episode happened in the fall, then it has happened about 4-5 times since then.  Most recently one was last night.   3. Are you SOB when sitting or when up moving around? Up moving around.  Walking from the car to the [place] [facility] last night is when he experience the episode last night.    4. Are you currently experiencing any other symptoms? No

## 2022-01-05 NOTE — Progress Notes (Signed)
AST:[id number] atrial fibrillation. Patient found to have atrial fibrillation in June 2018. Echocardiogram showed ejection fraction 45-50%, mild mitral and tricuspid regurgitation. At that time he was found to have acute cholecystitis and underwent cholecystectomy. Patient had successful TEE guided cardioversion on 02/14/2017. Nuclear study July 2018 showed ejection fraction 42%. There was no ischemia. Underwent repeat cardioversion April 2021.  Echocardiogram September 2022 showed normal LV function, mild right ventricular enlargement, mild RV dysfunction, biatrial enlargement, mild to moderate mitral regurgitation.  Patient contacted the office with increased dyspnea on exertion and was added to my schedule today.  Since last seen, patient notes that since the fall he has had increased dyspnea with more vigorous activities.  He had to stop while walking to the [facility].  He does not have dyspnea with routine activities.  No orthopnea, PND, pedal edema, chest pain, palpitations, syncope or bleeding.  Current Outpatient Medications  Medication Sig Dispense Refill   cetirizine (ZYRTEC) 10 MG tablet Take 10 mg by mouth daily.     cholecalciferol (VITAMIN D3) 25 MCG (1000 UNIT) tablet Take 1,000 Units by mouth daily.     COVID-19 mRNA bivalent vaccine, Pfizer, (PFIZER COVID-19 VAC BIVALENT) injection Inject into the muscle. 0.3 mL 0   COVID-19 mRNA Vac-TriS, Pfizer, (PFIZER-BIONT COVID-19 VAC-TRIS) SUSP injection Inject into the muscle. 0.3 mL 0   diltiazem (CARTIA XT) 120 MG 24 hr capsule Take 1 capsule (120 mg total) by mouth daily. 90 capsule 3   ELIQUIS 5 MG TABS tablet TAKE 1 TABLET(5 MG) BY MOUTH TWICE DAILY 180 tablet 1   fluticasone (FLOVENT HFA) 110 MCG/ACT inhaler Inhale 1 puff into the lungs 2 (two) times daily.      losartan (COZAAR) 50 MG tablet TAKE 1 TABLET(50 MG) BY MOUTH DAILY 90 tablet 3   metoprolol tartrate (LOPRESSOR) 50 MG tablet Take 1 tablet (50 mg total) by mouth 2  (two) times daily. 180 tablet 3   No current facility-administered medications for this visit.     Past Medical History:  Diagnosis Date   Arthritis    right knee oa   Asthma    Carpal tunnel syndrome    BILATERAL   Cholecystitis    Complication of anesthesia    adverse reaction to the spinal   Hypertension    PAF (paroxysmal atrial fibrillation) (HCC)    SBO (small bowel obstruction) ([place]) 12/2015    Past Surgical History:  Procedure Laterality Date   CARDIOVERSION N/A 02/14/2017   Procedure: CARDIOVERSION;  Surgeon: Josue Hector, MD;  Location: [place];  Service: Cardiovascular;  Laterality: N/A;   CARDIOVERSION N/A 12/11/2019   Procedure: CARDIOVERSION;  Surgeon: Lelon Perla, MD;  Location: [facility] ENDOSCOPY;  Service: Cardiovascular;  Laterality: N/A;   CHOLECYSTECTOMY  02/09/2017   CHOLECYSTECTOMY N/A 02/09/2017   Procedure: LAPAROSCOPIC CHOLECYSTECTOMY WITH INTRAOPERATIVE CHOLANGIOGRAM;  Surgeon: Donnie Mesa, MD;  Location: [place];  Service: General;  Laterality: N/A;   COLONOSCOPY WITH PROPOFOL N/A 11/09/2014   Procedure: COLONOSCOPY WITH PROPOFOL;  Surgeon: Garlan Fair, MD;  [facility];  Service: Endoscopy;  Laterality: N/A;   HERNIA REPAIR     2013   QUADRICEPS TENDON REPAIR Left 06/07/2015   Procedure: REPAIR LEFT QUADRICEP TENDON;  Surgeon: Gaynelle Arabian, MD;  [facility];  Service: Orthopedics;  Laterality: Left;   TEE WITHOUT CARDIOVERSION N/A 02/14/2017   Procedure: TRANSESOPHAGEAL ECHOCARDIOGRAM (TEE);  Surgeon: Josue Hector, MD;  Location: [place];  Service: Cardiovascular;  Laterality: N/A;   TOTAL KNEE ARTHROPLASTY Right 12/27/2015   Procedure: RIGHT TOTAL KNEE ARTHROPLASTY;  Surgeon: Gaynelle Arabian, MD;  [facility];  Service: Orthopedics;  Laterality: Right;    Social History   Socioeconomic History   Marital status: Married    Spouse name: Not on file   Number of children: Not on file   Years of education: Not  on file   Highest education level: Not on file  Occupational History   Not on file  Tobacco Use   Smoking status: Never   Smokeless tobacco: Never  Vaping Use   Vaping Use: Never used  Substance and Sexual Activity   Alcohol use: Yes    Comment:  2 beers daily   Drug use: No   Sexual activity: Not on file  Other Topics Concern   Not on file  Social History Narrative   Not on file   Social Determinants of Health   Financial Resource Strain: Not on file  Food Insecurity: Not on file  Transportation Needs: Not on file  Physical Activity: Not on file  Stress: Not on file  Social Connections: Not on file  Intimate Partner Violence: Not on file    Family History  Problem Relation Age of Onset   Hypertension Father     ROS: no fevers or chills, productive cough, hemoptysis, dysphasia, odynophagia, melena, hematochezia, dysuria, hematuria, rash, seizure activity, orthopnea, PND, pedal edema, claudication. Remaining systems are negative.  Physical Exam: Well-developed well-nourished in no acute distress.  Skin is warm and dry.  HEENT is normal.  Neck is supple.  Chest is clear to auscultation with normal expansion.  Cardiovascular exam is irregular Abdominal exam nontender or distended. No masses palpated. Extremities show no edema. neuro grossly intact  ECG-atrial fibrillation at a rate of 75, no ST changes.  Personally reviewed  A/P  1 dyspnea on exertion-etiology unclear but he dates this back to the fall.  I wonder if his atrial fibrillation is contributing.  We had previously made the decision to treat with rate control and anticoagulation.  However given his increased dyspnea on exertion I think we should try to reestablish sinus rhythm.  We will continue Cardizem but decrease metoprolol to 25 mg twice daily.  Continue apixaban (he has missed no doses).  Add amiodarone 200 mg twice daily for 2 weeks then 200 mg daily thereafter.  Schedule cardioversion in  approximately 6 weeks.  Hopefully reestablishing sinus rhythm will help with his dyspnea.  Note most recent echocardiogram showed normal LV function.  Also given increased dyspnea on exertion I will arrange a PET scan to exclude ischemia.  Check BNP though he is not volume overloaded on examination.  2 persistent atrial fibrillation-plan as outlined above.  3 hypertension-blood pressure elevated; however it typically is controlled at home.  We will follow and adjust as needed.  4 history of elevated troponin-occurred at the time of previous atrial fibrillation and follow-up nuclear study showed no ischemia.  5 history of cardiomyopathy-LV function improved on most recent echocardiogram.  Kirk Ruths, MD

## 2022-01-05 NOTE — Telephone Encounter (Signed)
Called patient, spoke with him and wife. They stated that patient has had episodes of increased shortness of breath with activities. He has been having more episodes recently, the most recent was last night. They denied swelling/weight gain, no changes in medication, no pain, but they are concerned when he was walking last night and he had to stop to catch his breath before continuing, they requested a visit to be seen. After review of the chart patient was due for a 6 month follow up, patient was scheduled for an opening with Dr.Crenshaw tomorrow at 9:40 AM. Patient and wife both verbalized understanding.

## 2022-01-06 ENCOUNTER — Encounter: Payer: Self-pay | Admitting: Cardiology

## 2022-01-06 ENCOUNTER — Ambulatory Visit: Payer: Medicare Other | Admitting: Cardiology

## 2022-01-06 ENCOUNTER — Telehealth: Payer: Self-pay | Admitting: Cardiology

## 2022-01-06 VITALS — BP 148/90 | HR 75 | Ht 71.0 in | Wt 202.4 lb

## 2022-01-06 DIAGNOSIS — I428 Other cardiomyopathies: Secondary | ICD-10-CM | POA: Diagnosis not present

## 2022-01-06 DIAGNOSIS — I4819 Other persistent atrial fibrillation: Secondary | ICD-10-CM

## 2022-01-06 DIAGNOSIS — I10 Essential (primary) hypertension: Secondary | ICD-10-CM | POA: Diagnosis not present

## 2022-01-06 DIAGNOSIS — R0609 Other forms of dyspnea: Secondary | ICD-10-CM | POA: Diagnosis not present

## 2022-01-06 MED ORDER — AMIODARONE HCL 200 MG PO TABS: ORAL_TABLET | ORAL | 0 refills | Status: DC

## 2022-01-06 MED ORDER — AMIODARONE HCL 200 MG PO TABS: 200.0000 mg | ORAL_TABLET | Freq: Every day | ORAL | 3 refills | Status: DC

## 2022-01-06 MED ORDER — METOPROLOL TARTRATE 25 MG PO TABS: 25.0000 mg | ORAL_TABLET | Freq: Two times a day (BID) | ORAL | 3 refills | Status: DC

## 2022-01-06 NOTE — Telephone Encounter (Signed)
   Pt's wife calling, she said, she have some questions about pt's medication related to his upcoming procedure. She said to call their home phone number

## 2022-01-06 NOTE — Patient Instructions (Signed)
Medication Instructions:   DECREASE METOPROLOL TO 25 MG TWICE DAILY=1/2 OF THE 50 MG TABLET TWICE DAILY  START AMIODARONE 200 MG ONE TABLET TWICE DAILY X 2 WEEKS THEN AMIODARONE 200 MG ONCE DAILY  *If you need a refill on your cardiac medications before your next appointment, please call your pharmacy*   Testing/Procedures:  How to Prepare for Your Cardiac PET/CT Stress Test:  1. Please do not take these medications before your test:   Medications that may interfere with the cardiac pharmacological stress agent (ex. nitrates or beta-blockers) the day of the exam. Theophylline containing medications for 12 hours. Dipyridamole 48 hours prior to the test. Your remaining medications may be taken with water.  2. Nothing to eat or drink, except water, 3 hours prior to arrival time.   NO caffeine/decaffeinated products, or chocolate 12 hours prior to arrival.  3. NO perfume, cologne or lotion  4. Total time is 1 to 2 hours; you may want to bring reading material for the waiting time.  5. Please report to Admitting at the [facility] Main Entrance 60 minutes early for your test.  [place], [place] [address]  Diabetic Preparation:  Hold oral medications. You may take NPH and Lantus insulin. Do not take Humalog or Humulin R (Regular Insulin) the day of your test. Check blood sugars prior to leaving the house. If able to eat breakfast prior to 3 hour fasting, you may take all medications, including your insulin, Do not worry if you miss your breakfast dose of insulin - start at your next meal.  IF YOU THINK YOU MAY BE PREGNANT, OR ARE NURSING PLEASE INFORM THE TECHNOLOGIST.  In preparation for your appointment, medication and supplies will be purchased.  Appointment availability is limited, so if you need to cancel or reschedule, please call the Radiology Department at [phone]  24 hours in advance to avoid a cancellation fee of $100.00  What to Expect  After you Arrive:  Once you arrive and check in for your appointment, you will be taken to a preparation room within the Radiology Department.  A technologist or Nurse will obtain your medical history, verify that you are correctly prepped for the exam, and explain the procedure.  Afterwards,  an IV will be started in your arm and electrodes will be placed on your skin for EKG monitoring during the stress portion of the exam. Then you will be escorted to the PET/CT scanner.  There, staff will get you positioned on the scanner and obtain a blood pressure and EKG.  During the exam, you will continue to be connected to the EKG and blood pressure machines.  A small, safe amount of a radioactive tracer will be injected in your IV to obtain a series of pictures of your heart along with an injection of a stress agent.    After your Exam:  It is recommended that you eat a meal and drink a caffeinated beverage to counter act any effects of the stress agent.  Drink plenty of fluids for the remainder of the day and urinate frequently for the first couple of hours after the exam.  Your doctor will inform you of your test results within 7-10 business days.  For questions about your test or how to prepare for your test, please call: Marchia Bond, Cardiac Imaging Nurse Navigator  Gordy Clement, Cardiac Imaging Nurse Navigator Office: [phone]     You are scheduled for a  Cardioversion on Monday 02/13/22 with  Dr. Stanford Breed.  Please arrive at the [facility] (Main Entrance A) at [facility]: [address] [place], [place] [address] at 8 am. (1 hour prior to procedure unless lab work is needed; if lab work is needed arrive 1.5 hours ahead)  DIET: Nothing to eat or drink after midnight except a sip of water with medications (see medication instructions below)  FYI: For your safety, and to allow [place] to monitor your vital signs accurately during the surgery/procedure we request that   if you have  artificial nails, gel coating, SNS etc. Please have those removed prior to your surgery/procedure. Not having the nail coverings /polish removed may result in cancellation or delay of your surgery/procedure.   Medication Instructions: DO NOT TAKE DILTIAZEM THE MORNING OF THE PROCEDURE  Continue your anticoagulant: ELIQUIS   You must have a responsible person to drive you home and stay in the waiting area during your procedure. Failure to do so could result in cancellation.  Bring your insurance cards.  *Special Note: Every effort is made to have your procedure done on time. Occasionally there are emergencies that occur at the hospital that may cause delays. Please be patient if a delay does occur.     Follow-Up: At [facility], you and your health needs are our priority.  As part of our continuing mission to provide you with exceptional heart care, we have created designated Provider Care Teams.  These Care Teams include your primary Cardiologist (physician) and Advanced Practice Providers (APPs -  Physician Assistants and Nurse Practitioners) who all work together to provide you with the care you need, when you need it.  We recommend signing up for the patient portal called "MyChart".  Sign up information is provided on this After Visit Summary.  MyChart is used to connect with patients for Virtual Visits (Telemedicine).  Patients are able to view lab/test results, encounter notes, upcoming appointments, etc.  Non-urgent messages can be sent to your provider as well.   To learn more about what you can do with MyChart, go to [email or web address].    Your next appointment:   8 week(s)  The format for your next appointment:   In Person  Provider:   Kirk Ruths, MD       Important Information About Sugar

## 2022-01-06 NOTE — Telephone Encounter (Signed)
Spoke with pt wife, questions regarding medication changes from today answered.

## 2022-01-07 LAB — CBC
Hematocrit: 41 % (ref 37.5–51.0)
Hemoglobin: 14.4 g/dL (ref 13.0–17.7)
MCH: 34.4 pg — ABNORMAL HIGH (ref 26.6–33.0)
MCHC: 35.1 g/dL (ref 31.5–35.7)
MCV: 98 fL — ABNORMAL HIGH (ref 79–97)
Platelets: 241 10*3/uL (ref 150–450)
RBC: 4.19 x10E6/uL (ref 4.14–5.80)
RDW: 12.6 % (ref 11.6–15.4)
WBC: 5.4 10*3/uL (ref 3.4–10.8)

## 2022-01-07 LAB — BASIC METABOLIC PANEL
BUN/Creatinine Ratio: 17 (ref 10–24)
BUN: 16 mg/dL (ref 8–27)
CO2: 23 mmol/L (ref 20–29)
Calcium: 9.3 mg/dL (ref 8.6–10.2)
Chloride: 99 mmol/L (ref 96–106)
Creatinine, Ser: 0.94 mg/dL (ref 0.76–1.27)
Glucose: 92 mg/dL (ref 70–99)
Potassium: 5.1 mmol/L (ref 3.5–5.2)
Sodium: 135 mmol/L (ref 134–144)
eGFR: 78 mL/min/{1.73_m2} (ref >59)

## 2022-01-07 LAB — PRO B NATRIURETIC PEPTIDE: NT-Pro BNP: 1882 pg/mL — ABNORMAL HIGH (ref 0–486)

## 2022-01-09 ENCOUNTER — Telehealth: Payer: Self-pay | Admitting: *Deleted

## 2022-01-09 DIAGNOSIS — R0609 Other forms of dyspnea: Secondary | ICD-10-CM

## 2022-01-09 MED ORDER — FUROSEMIDE 20 MG PO TABS: 20.0000 mg | ORAL_TABLET | Freq: Every day | ORAL | 3 refills | Status: DC

## 2022-01-09 NOTE — Telephone Encounter (Signed)
pt aware of results  New script sent to the pharmacy  Lab orders mailed to the pt  

## 2022-01-09 NOTE — Telephone Encounter (Signed)
-----   Message from Lelon Perla, MD sent at 01/09/2022  7:32 AM EDT ----- BNP mildly elevated; add lasix 20 mg daily; bmet one week Kirk Ruths

## 2022-01-18 ENCOUNTER — Other Ambulatory Visit: Payer: Self-pay | Admitting: Cardiology

## 2022-01-23 DIAGNOSIS — R0609 Other forms of dyspnea: Secondary | ICD-10-CM | POA: Diagnosis not present

## 2022-01-24 LAB — BASIC METABOLIC PANEL
BUN/Creatinine Ratio: 18 (ref 10–24)
BUN: 18 mg/dL (ref 8–27)
CO2: 25 mmol/L (ref 20–29)
Calcium: 9.2 mg/dL (ref 8.6–10.2)
Chloride: 97 mmol/L (ref 96–106)
Creatinine, Ser: 1.02 mg/dL (ref 0.76–1.27)
Glucose: 83 mg/dL (ref 70–99)
Potassium: 5.2 mmol/L (ref 3.5–5.2)
Sodium: 135 mmol/L (ref 134–144)
eGFR: 71 mL/min/{1.73_m2} (ref >59)

## 2022-02-08 ENCOUNTER — Encounter (HOSPITAL_COMMUNITY): Payer: Self-pay | Admitting: Cardiology

## 2022-02-12 NOTE — Anesthesia Preprocedure Evaluation (Addendum)
Anesthesia Evaluation  Patient identified by MRN, date of birth, ID band Patient awake    Reviewed: Allergy & Precautions, NPO status , Patient's Chart, lab work & pertinent test results, reviewed documented beta blocker date and time   History of Anesthesia Complications Negative for: history of anesthetic complications  Airway Mallampati: II  TM Distance: >3 FB Neck ROM: Full    Dental  (+) Dental Advisory Given, Teeth Intact   Pulmonary asthma ,    Pulmonary exam normal        Cardiovascular hypertension, Pt. on medications and Pt. on home beta blockers +CHF  + dysrhythmias Atrial Fibrillation  Rhythm:Irregular Rate:Normal   '22 TTE - EF 60 to 65%. Right ventricular systolic function is mildly reduced. The right ventricular size is mildly enlarged. There is mildly elevated pulmonary artery systolic pressure. Left atrial size was mild to moderately dilated. Right atrial size was mildly dilated. Mild to moderate mitral valve regurgitation. Mild aortic valve sclerosis    Neuro/Psych  Neuromuscular disease negative psych ROS   GI/Hepatic negative GI ROS, Neg liver ROS,   Endo/Other  negative endocrine ROS  Renal/GU negative Renal ROS     Musculoskeletal  (+) Arthritis ,   Abdominal   Peds  Hematology  On eliquis    Anesthesia Other Findings   Reproductive/Obstetrics                            Anesthesia Physical Anesthesia Plan  ASA: 3  Anesthesia Plan: General   Post-op Pain Management: Minimal or no pain anticipated   Induction: Intravenous  PONV Risk Score and Plan: 2 and Treatment may vary due to age or medical condition and Propofol infusion  Airway Management Planned: Natural Airway and Mask  Additional Equipment: None  Intra-op Plan:   Post-operative Plan:   Informed Consent: I have reviewed the patients History and Physical, chart, labs and discussed the procedure  including the risks, benefits and alternatives for the proposed anesthesia with the patient or authorized representative who has indicated his/her understanding and acceptance.       Plan Discussed with: CRNA and Anesthesiologist  Anesthesia Plan Comments:        Anesthesia Quick Evaluation

## 2022-02-13 ENCOUNTER — Ambulatory Visit (HOSPITAL_BASED_OUTPATIENT_CLINIC_OR_DEPARTMENT_OTHER): Payer: Medicare Other | Admitting: Anesthesiology

## 2022-02-13 ENCOUNTER — Other Ambulatory Visit: Payer: Self-pay

## 2022-02-13 ENCOUNTER — Encounter (HOSPITAL_COMMUNITY)
Admission: RE | Disposition: A | Discharge status: Home / Self Care | Payer: Self-pay | Source: Home / Self Care | Attending: Cardiology

## 2022-02-13 ENCOUNTER — Encounter (HOSPITAL_COMMUNITY): Payer: Self-pay | Admitting: Cardiology

## 2022-02-13 ENCOUNTER — Ambulatory Visit (HOSPITAL_COMMUNITY)
Admission: RE | Admit: 2022-02-13 | Discharge: 2022-02-13 | Disposition: A | Discharge status: Home / Self Care | Payer: Medicare Other | Attending: Cardiology | Admitting: Cardiology

## 2022-02-13 ENCOUNTER — Ambulatory Visit (HOSPITAL_COMMUNITY): Payer: Medicare Other | Admitting: Anesthesiology

## 2022-02-13 DIAGNOSIS — I509 Heart failure, unspecified: Secondary | ICD-10-CM | POA: Insufficient documentation

## 2022-02-13 DIAGNOSIS — I4891 Unspecified atrial fibrillation: Secondary | ICD-10-CM | POA: Diagnosis not present

## 2022-02-13 DIAGNOSIS — J45909 Unspecified asthma, uncomplicated: Secondary | ICD-10-CM | POA: Insufficient documentation

## 2022-02-13 DIAGNOSIS — Z79899 Other long term (current) drug therapy: Secondary | ICD-10-CM | POA: Diagnosis not present

## 2022-02-13 DIAGNOSIS — I4819 Other persistent atrial fibrillation: Secondary | ICD-10-CM | POA: Diagnosis not present

## 2022-02-13 DIAGNOSIS — I428 Other cardiomyopathies: Secondary | ICD-10-CM | POA: Insufficient documentation

## 2022-02-13 DIAGNOSIS — M1990 Unspecified osteoarthritis, unspecified site: Secondary | ICD-10-CM | POA: Diagnosis not present

## 2022-02-13 DIAGNOSIS — I110 Hypertensive heart disease with heart failure: Secondary | ICD-10-CM | POA: Insufficient documentation

## 2022-02-13 DIAGNOSIS — R0609 Other forms of dyspnea: Secondary | ICD-10-CM | POA: Diagnosis not present

## 2022-02-13 HISTORY — PX: CARDIOVERSION: SHX1299

## 2022-02-13 LAB — POCT I-STAT, CHEM 8
BUN: 20 mg/dL (ref 8–23)
Calcium, Ion: 1.11 mmol/L — ABNORMAL LOW (ref 1.15–1.40)
Chloride: 103 mmol/L (ref 98–111)
Creatinine, Ser: 0.9 mg/dL (ref 0.61–1.24)
Glucose, Bld: 103 mg/dL — ABNORMAL HIGH (ref 70–99)
HCT: 40 % (ref 39.0–52.0)
Hemoglobin: 13.6 g/dL (ref 13.0–17.0)
Potassium: 4.3 mmol/L (ref 3.5–5.1)
Sodium: 136 mmol/L (ref 135–145)
TCO2: 22 mmol/L (ref 22–32)

## 2022-02-13 SURGERY — CARDIOVERSION
Anesthesia: General

## 2022-02-13 MED ORDER — SODIUM CHLORIDE 0.9 % IV SOLN: INTRAVENOUS | Status: DC

## 2022-02-13 MED ORDER — LIDOCAINE 2% (20 MG/ML) 5 ML SYRINGE
INTRAMUSCULAR | Status: DC | PRN
  Administered 2022-02-13: 60 mg via INTRAVENOUS

## 2022-02-13 MED ORDER — PROPOFOL 10 MG/ML IV BOLUS
INTRAVENOUS | Status: DC | PRN
  Administered 2022-02-13: 60 mg via INTRAVENOUS

## 2022-02-13 NOTE — H&P (Signed)
Office Visit  01/06/2022 [facility] Dondra Prader, MD Cardiology NICM (nonischemic cardiomyopathy) [facility]) +4 more Dx Follow-up  Atrial Fibrillation; Referred by Kirby Funk, MD Reason for Visit   Additional Documentation  Vitals:  BP 148/90 Important   Pulse 75 Ht 5\' 11"  (1.803 m) Wt 91.8 kg SpO2 94% BMI 28.23 kg/m BSA 2.14 m  More Vitals  Flowsheets:  Anthropometrics, NEWS, MEWS Score   Encounter Info:  Billing Info, History, Allergies, Detailed Report    All Notes    Progress Notes by Lewayne Bunting, MD at 01/06/2022 9:40 AM  Author: Lewayne Bunting, MD Author Type: Physician Filed: 01/06/2022 10:35 AM  Note Status: Signed Cosign: Cosign Not Required Encounter Date: 01/06/2022  Editor: Lewayne Bunting, MD (Physician)                        [id number] atrial fibrillation. Patient found to have atrial fibrillation in June 2018. Echocardiogram showed ejection fraction 45-50%, mild mitral and tricuspid regurgitation. At that time he was found to have acute cholecystitis and underwent cholecystectomy. Patient had successful TEE guided cardioversion on 02/14/2017. Nuclear study July 2018 showed ejection fraction 42%. There was no ischemia. Underwent repeat cardioversion April 2021.  Echocardiogram September 2022 showed normal LV function, mild right ventricular enlargement, mild RV dysfunction, biatrial enlargement, mild to moderate mitral regurgitation.  Patient contacted the office with increased dyspnea on exertion and was added to my schedule today.  Since last seen, patient notes that since the fall he has had increased dyspnea with more vigorous activities.  He had to stop while walking to the [facility].  He does not have dyspnea with routine activities.  No orthopnea, PND, pedal edema, chest pain, palpitations, syncope or bleeding.         Current Outpatient Medications  Medication Sig Dispense Refill   cetirizine (ZYRTEC) 10 MG  tablet Take 10 mg by mouth daily.       cholecalciferol (VITAMIN D3) 25 MCG (1000 UNIT) tablet Take 1,000 Units by mouth daily.       COVID-19 mRNA bivalent vaccine, Pfizer, (PFIZER COVID-19 VAC BIVALENT) injection Inject into the muscle. 0.3 mL 0   COVID-19 mRNA Vac-TriS, Pfizer, (PFIZER-BIONT COVID-19 VAC-TRIS) SUSP injection Inject into the muscle. 0.3 mL 0   diltiazem (CARTIA XT) 120 MG 24 hr capsule Take 1 capsule (120 mg total) by mouth daily. 90 capsule 3   ELIQUIS 5 MG TABS tablet TAKE 1 TABLET(5 MG) BY MOUTH TWICE DAILY 180 tablet 1   fluticasone (FLOVENT HFA) 110 MCG/ACT inhaler Inhale 1 puff into the lungs 2 (two) times daily.        losartan (COZAAR) 50 MG tablet TAKE 1 TABLET(50 MG) BY MOUTH DAILY 90 tablet 3   metoprolol tartrate (LOPRESSOR) 50 MG tablet Take 1 tablet (50 mg total) by mouth 2 (two) times daily. 180 tablet 3    No current facility-administered medications for this visit.            Past Medical History:  Diagnosis Date   Arthritis      right knee oa   Asthma     Carpal tunnel syndrome      BILATERAL   Cholecystitis     Complication of anesthesia      adverse reaction to the spinal   Hypertension     PAF (paroxysmal atrial fibrillation) (HCC)     SBO (small bowel obstruction) (HCC) 12/2015  Past Surgical History:  Procedure Laterality Date   CARDIOVERSION N/A 02/14/2017    Procedure: CARDIOVERSION;  Surgeon: Wendall Stade, MD;  Location: [facility] ENDOSCOPY;  Service: Cardiovascular;  Laterality: N/A;   CARDIOVERSION N/A 12/11/2019    Procedure: CARDIOVERSION;  Surgeon: Lewayne Bunting, MD;  Location: [facility] ENDOSCOPY;  Service: Cardiovascular;  Laterality: N/A;   CHOLECYSTECTOMY   02/09/2017   CHOLECYSTECTOMY N/A 02/09/2017    Procedure: LAPAROSCOPIC CHOLECYSTECTOMY WITH INTRAOPERATIVE CHOLANGIOGRAM;  Surgeon: Manus Rudd, MD;  Location: [facility] OR;  Service: General;  Laterality: N/A;   COLONOSCOPY WITH PROPOFOL N/A 11/09/2014    Procedure:  COLONOSCOPY WITH PROPOFOL;  Surgeon: Charolett Bumpers, MD;  Location: [facility] ENDOSCOPY;  Service: Endoscopy;  Laterality: N/A;   HERNIA REPAIR        2013   QUADRICEPS TENDON REPAIR Left 06/07/2015    Procedure: REPAIR LEFT QUADRICEP TENDON;  Surgeon: Ollen Gross, MD;  Location: [facility] ORS;  Service: Orthopedics;  Laterality: Left;   TEE WITHOUT CARDIOVERSION N/A 02/14/2017    Procedure: TRANSESOPHAGEAL ECHOCARDIOGRAM (TEE);  Surgeon: Wendall Stade, MD;  Location: [facility] ENDOSCOPY;  Service: Cardiovascular;  Laterality: N/A;   TOTAL KNEE ARTHROPLASTY Right 12/27/2015    Procedure: RIGHT TOTAL KNEE ARTHROPLASTY;  Surgeon: Ollen Gross, MD;  Location: [facility] ORS;  Service: Orthopedics;  Laterality: Right;      Social History         Socioeconomic History   Marital status: Married      Spouse name: Not on file   Number of children: Not on file   Years of education: Not on file   Highest education level: Not on file  Occupational History   Not on file  Tobacco Use   Smoking status: Never   Smokeless tobacco: Never  Vaping Use   Vaping Use: Never used  Substance and Sexual Activity   Alcohol use: Yes      Comment:  2 beers daily   Drug use: No   Sexual activity: Not on file  Other Topics Concern   Not on file  Social History Narrative   Not on file    Social Determinants of Health    Financial Resource Strain: Not on file  Food Insecurity: Not on file  Transportation Needs: Not on file  Physical Activity: Not on file  Stress: Not on file  Social Connections: Not on file  Intimate Partner Violence: Not on file           Family History  Problem Relation Age of Onset   Hypertension Father        ROS: no fevers or chills, productive cough, hemoptysis, dysphasia, odynophagia, melena, hematochezia, dysuria, hematuria, rash, seizure activity, orthopnea, PND, pedal edema, claudication. Remaining systems are negative.   Physical Exam: Well-developed well-nourished in no acute distress.   Skin is warm and dry.  HEENT is normal.  Neck is supple.  Chest is clear to auscultation with normal expansion.  Cardiovascular exam is irregular Abdominal exam nontender or distended. No masses palpated. Extremities show no edema. neuro grossly intact   ECG-atrial fibrillation at a rate of 75, no ST changes.  Personally reviewed   A/P   1 dyspnea on exertion-etiology unclear but he dates this back to the fall.  I wonder if his atrial fibrillation is contributing.  We had previously made the decision to treat with rate control and anticoagulation.  However given his increased dyspnea on exertion I think we should try to reestablish sinus rhythm.  We will  continue Cardizem but decrease metoprolol to 25 mg twice daily.  Continue apixaban (he has missed no doses).  Add amiodarone 200 mg twice daily for 2 weeks then 200 mg daily thereafter.  Schedule cardioversion in approximately 6 weeks.  Hopefully reestablishing sinus rhythm will help with his dyspnea.  Note most recent echocardiogram showed normal LV function.  Also given increased dyspnea on exertion I will arrange a PET scan to exclude ischemia.  Check BNP though he is not volume overloaded on examination.   2 persistent atrial fibrillation-plan as outlined above.   3 hypertension-blood pressure elevated; however it typically is controlled at home.  We will follow and adjust as needed.   4 history of elevated troponin-occurred at the time of previous atrial fibrillation and follow-up nuclear study showed no ischemia.   5 history of cardiomyopathy-LV function improved on most recent echocardiogram.   Olga Millers, MD      As above, patient seen and examined.  Patient has dyspnea on exertion but denies chest pain.  His symptoms are unchanged since previous office visit.  I agree with past medical history, social history, review of systems.    Physical exam shows lungs clear to auscultation.  Cardiovascular exam reveals an irregular  rhythm.  Abdominal exam is benign.  Extremities show no edema.  Persistent atrial fibrillation-patient has been initiated on amiodarone.  Plan is to proceed with elective cardioversion.  He has been compliant with apixaban and has missed no doses. Olga Millers, MD

## 2022-02-13 NOTE — Anesthesia Postprocedure Evaluation (Signed)
Anesthesia Post Note  Patient: [name]  Procedure(s) Performed: CARDIOVERSION     Patient location during evaluation: PACU Anesthesia Type: General Level of consciousness: awake and alert Pain management: pain level controlled Vital Signs Assessment: post-procedure vital signs reviewed and stable Respiratory status: spontaneous breathing, nonlabored ventilation and respiratory function stable Cardiovascular status: stable and blood pressure returned to baseline Anesthetic complications: no   No notable events documented.  Last Vitals:  Vitals:   02/13/22 0900 02/13/22 0908  BP: 108/63 118/64  Pulse: (!) 59 (!) 58  Resp:  17  Temp:    SpO2: 94% 94%    Last Pain:  Vitals:   02/13/22 0846  TempSrc: Temporal  PainSc: 0-No pain                 Beryle Lathe

## 2022-02-14 ENCOUNTER — Encounter (HOSPITAL_COMMUNITY): Payer: Self-pay | Admitting: Cardiology

## 2022-02-14 ENCOUNTER — Telehealth: Payer: Self-pay | Admitting: Cardiology

## 2022-02-15 NOTE — Telephone Encounter (Signed)
Returned call to patient's wife left message on personal voice mail to call back. 

## 2022-02-15 NOTE — Telephone Encounter (Signed)
Patient's wife calling back. °

## 2022-02-15 NOTE — Telephone Encounter (Signed)
Spoke to wife all questions answered. She will call back if pt goes out of rhythm. He is fine at this time.

## 2022-02-16 NOTE — H&P (Signed)
Patient seen and examined.  Patient has dyspnea on exertion but denies chest pain.  His symptoms are unchanged since previous office visit.  I agree with past medical history, social history, review of systems.     Physical exam shows lungs clear to auscultation.  Cardiovascular exam reveals an irregular rhythm.  Abdominal exam is benign.  Extremities show no edema.   Persistent atrial fibrillation-patient has been initiated on amiodarone.  Plan is to proceed with elective cardioversion.  He has been compliant with apixaban and has missed no doses. Kirk Ruths, MD    Lelon Perla, MD Physician Cardiology H&P    Signed Date of Service:  02/13/2022  8:21 AM   Signed                                        Office Visit  01/06/2022 [facility] Wendy Poet, MD Cardiology NICM (nonischemic cardiomyopathy) [facility]) +4 more Dx Follow-up  Atrial Fibrillation; Referred by Lavone Orn, MD Reason for Visit    Additional Documentation   Vitals:  BP 148/90 Important   Pulse 75 Ht '5\' 11"'$  (1.803 m) Wt 91.8 kg SpO2 94% BMI 28.23 kg/m BSA 2.14 m   More Vitals  Flowsheets:  Anthropometrics, NEWS, MEWS Score    Encounter Info:  Billing Info, History, Allergies, Detailed Report      All Notes      Progress Notes by Lelon Perla, MD at 01/06/2022 9:40 AM           Author: Lelon Perla, MD Author Type: Physician Filed: 01/06/2022 10:35 AM   Note Status: Signed Cosign: Cosign Not Required Encounter Date: 01/06/2022   Editor: Lelon Perla, MD (Physician)                                    [id number] atrial fibrillation. Patient found to have atrial fibrillation in June 2018. Echocardiogram showed ejection fraction 45-50%, mild mitral and tricuspid regurgitation. At that time he was found to have acute cholecystitis and underwent cholecystectomy. Patient had successful TEE guided cardioversion on 02/14/2017. Nuclear study July  2018 showed ejection fraction 42%. There was no ischemia. Underwent repeat cardioversion April 2021.  Echocardiogram September 2022 showed normal LV function, mild right ventricular enlargement, mild RV dysfunction, biatrial enlargement, mild to moderate mitral regurgitation.  Patient contacted the office with increased dyspnea on exertion and was added to my schedule today.  Since last seen, patient notes that since the fall he has had increased dyspnea with more vigorous activities.  He had to stop while walking to the [facility].  He does not have dyspnea with routine activities.  No orthopnea, PND, pedal edema, chest pain, palpitations, syncope or bleeding.              Current Outpatient Medications  Medication Sig Dispense Refill   cetirizine (ZYRTEC) 10 MG tablet Take 10 mg by mouth daily.       cholecalciferol (VITAMIN D3) 25 MCG (1000 UNIT) tablet Take 1,000 Units by mouth daily.       COVID-19 mRNA bivalent vaccine, Pfizer, (PFIZER COVID-19 VAC BIVALENT) injection Inject into the muscle. 0.3 mL 0   COVID-19 mRNA Vac-TriS, Pfizer, (PFIZER-BIONT COVID-19 VAC-TRIS) SUSP injection Inject into the muscle. 0.3 mL 0   diltiazem (CARTIA XT) 120 MG  24 hr capsule Take 1 capsule (120 mg total) by mouth daily. 90 capsule 3   ELIQUIS 5 MG TABS tablet TAKE 1 TABLET(5 MG) BY MOUTH TWICE DAILY 180 tablet 1   fluticasone (FLOVENT HFA) 110 MCG/ACT inhaler Inhale 1 puff into the lungs 2 (two) times daily.        losartan (COZAAR) 50 MG tablet TAKE 1 TABLET(50 MG) BY MOUTH DAILY 90 tablet 3   metoprolol tartrate (LOPRESSOR) 50 MG tablet Take 1 tablet (50 mg total) by mouth 2 (two) times daily. 180 tablet 3    No current facility-administered medications for this visit.               Past Medical History:  Diagnosis Date   Arthritis      right knee oa   Asthma     Carpal tunnel syndrome      BILATERAL   Cholecystitis     Complication of anesthesia      adverse reaction to the spinal    Hypertension     PAF (paroxysmal atrial fibrillation) (HCC)     SBO (small bowel obstruction) ([place]) 12/2015               Past Surgical History:  Procedure Laterality Date   CARDIOVERSION N/A 02/14/2017    Procedure: CARDIOVERSION;  Surgeon: Josue Hector, MD;  Location: [place];  Service: Cardiovascular;  Laterality: N/A;   CARDIOVERSION N/A 12/11/2019    Procedure: CARDIOVERSION;  Surgeon: Lelon Perla, MD;  Location: [facility] ENDOSCOPY;  Service: Cardiovascular;  Laterality: N/A;   CHOLECYSTECTOMY   02/09/2017   CHOLECYSTECTOMY N/A 02/09/2017    Procedure: LAPAROSCOPIC CHOLECYSTECTOMY WITH INTRAOPERATIVE CHOLANGIOGRAM;  Surgeon: Donnie Mesa, MD;  Location: [place];  Service: General;  Laterality: N/A;   COLONOSCOPY WITH PROPOFOL N/A 11/09/2014    Procedure: COLONOSCOPY WITH PROPOFOL;  Surgeon: Garlan Fair, MD;  Location: [facility] ENDOSCOPY;  Service: Endoscopy;  Laterality: N/A;   HERNIA REPAIR        2013   QUADRICEPS TENDON REPAIR Left 06/07/2015    Procedure: REPAIR LEFT QUADRICEP TENDON;  Surgeon: Gaynelle Arabian, MD;  Location: [facility];  Service: Orthopedics;  Laterality: Left;   TEE WITHOUT CARDIOVERSION N/A 02/14/2017    Procedure: TRANSESOPHAGEAL ECHOCARDIOGRAM (TEE);  Surgeon: Josue Hector, MD;  Location[facility]COPY;  Service: Cardiovascular;  Laterality: N/A;   TOTAL KNEE ARTHROPLASTY Right 12/27/2015    Procedure: RIGHT TOTAL KNEE ARTHROPLASTY;  Surgeon: Gaynelle Arabian, MD;  Location: [facility];  Service: Orthopedics;  Laterality: Right;      Social History             Socioeconomic History   Marital status: Married      Spouse name: Not on file   Number of children: Not on file   Years of education: Not on file   Highest education level: Not on file  Occupational History   Not on file  Tobacco Use   Smoking status: Never   Smokeless tobacco: Never  Vaping Use   Vaping Use: Never used  Substance and Sexual Activity   Alcohol use: Yes      Comment:  2 beers daily    Drug use: No   Sexual activity: Not on file  Other Topics Concern   Not on file  Social History Narrative   Not on file    Social Determinants of Health    Financial Resource Strain: Not on file  Food Insecurity: Not on file  Transportation Needs: Not on file  Physical Activity: Not on file  Stress: Not on file  Social Connections: Not on file  Intimate Partner Violence: Not on file               Family History  Problem Relation Age of Onset   Hypertension Father        ROS: no fevers or chills, productive cough, hemoptysis, dysphasia, odynophagia, melena, hematochezia, dysuria, hematuria, rash, seizure activity, orthopnea, PND, pedal edema, claudication. Remaining systems are negative.   Physical Exam: Well-developed well-nourished in no acute distress.  Skin is warm and dry.  HEENT is normal.  Neck is supple.  Chest is clear to auscultation with normal expansion.  Cardiovascular exam is irregular Abdominal exam nontender or distended. No masses palpated. Extremities show no edema. neuro grossly intact   ECG-atrial fibrillation at a rate of 75, no ST changes.  Personally reviewed   A/P   1 dyspnea on exertion-etiology unclear but he dates this back to the fall.  I wonder if his atrial fibrillation is contributing.  We had previously made the decision to treat with rate control and anticoagulation.  However given his increased dyspnea on exertion I think we should try to reestablish sinus rhythm.  We will continue Cardizem but decrease metoprolol to 25 mg twice daily.  Continue apixaban (he has missed no doses).  Add amiodarone 200 mg twice daily for 2 weeks then 200 mg daily thereafter.  Schedule cardioversion in approximately 6 weeks.  Hopefully reestablishing sinus rhythm will help with his dyspnea.  Note most recent echocardiogram showed normal LV function.  Also given increased dyspnea on exertion I will arrange a PET scan to exclude ischemia.  Check BNP though he is  not volume overloaded on examination.   2 persistent atrial fibrillation-plan as outlined above.   3 hypertension-blood pressure elevated; however it typically is controlled at home.  We will follow and adjust as needed.   4 history of elevated troponin-occurred at the time of previous atrial fibrillation and follow-up nuclear study showed no ischemia.   5 history of cardiomyopathy-LV function improved on most recent echocardiogram.   Kirk Ruths, MD

## 2022-02-17 ENCOUNTER — Encounter (HOSPITAL_COMMUNITY): Payer: Self-pay

## 2022-02-17 ENCOUNTER — Other Ambulatory Visit: Payer: Self-pay

## 2022-02-17 ENCOUNTER — Emergency Department (HOSPITAL_COMMUNITY)
Admission: EM | Admit: 2022-02-17 | Discharge: 2022-02-17 | Disposition: A | Discharge status: Home / Self Care | Payer: Medicare Other | Attending: Emergency Medicine | Admitting: Emergency Medicine

## 2022-02-17 ENCOUNTER — Telehealth: Payer: Self-pay | Admitting: Cardiology

## 2022-02-17 ENCOUNTER — Emergency Department (HOSPITAL_COMMUNITY): Payer: Medicare Other

## 2022-02-17 DIAGNOSIS — Z79899 Other long term (current) drug therapy: Secondary | ICD-10-CM | POA: Diagnosis not present

## 2022-02-17 DIAGNOSIS — R790 Abnormal level of blood mineral: Secondary | ICD-10-CM | POA: Insufficient documentation

## 2022-02-17 DIAGNOSIS — R42 Dizziness and giddiness: Secondary | ICD-10-CM | POA: Insufficient documentation

## 2022-02-17 DIAGNOSIS — J90 Pleural effusion, not elsewhere classified: Secondary | ICD-10-CM | POA: Diagnosis not present

## 2022-02-17 DIAGNOSIS — R0609 Other forms of dyspnea: Secondary | ICD-10-CM

## 2022-02-17 DIAGNOSIS — R0602 Shortness of breath: Secondary | ICD-10-CM | POA: Diagnosis not present

## 2022-02-17 DIAGNOSIS — I110 Hypertensive heart disease with heart failure: Secondary | ICD-10-CM | POA: Diagnosis not present

## 2022-02-17 DIAGNOSIS — I509 Heart failure, unspecified: Secondary | ICD-10-CM | POA: Diagnosis not present

## 2022-02-17 DIAGNOSIS — J811 Chronic pulmonary edema: Secondary | ICD-10-CM | POA: Diagnosis not present

## 2022-02-17 LAB — CBC
HCT: 42 % (ref 39.0–52.0)
Hemoglobin: 14.5 g/dL (ref 13.0–17.0)
MCH: 35.1 pg — ABNORMAL HIGH (ref 26.0–34.0)
MCHC: 34.5 g/dL (ref 30.0–36.0)
MCV: 101.7 fL — ABNORMAL HIGH (ref 80.0–100.0)
Platelets: 248 10*3/uL (ref 150–400)
RBC: 4.13 MIL/uL — ABNORMAL LOW (ref 4.22–5.81)
RDW: 12.9 % (ref 11.5–15.5)
WBC: 7.8 10*3/uL (ref 4.0–10.5)
nRBC: 0 % (ref 0.0–0.2)

## 2022-02-17 LAB — BASIC METABOLIC PANEL
Anion gap: 10 (ref 5–15)
BUN: 18 mg/dL (ref 8–23)
CO2: 24 mmol/L (ref 22–32)
Calcium: 8.8 mg/dL — ABNORMAL LOW (ref 8.9–10.3)
Chloride: 100 mmol/L (ref 98–111)
Creatinine, Ser: 1.21 mg/dL (ref 0.61–1.24)
GFR, Estimated: 58 mL/min — ABNORMAL LOW (ref >60)
Glucose, Bld: 110 mg/dL — ABNORMAL HIGH (ref 70–99)
Potassium: 4.7 mmol/L (ref 3.5–5.1)
Sodium: 134 mmol/L — ABNORMAL LOW (ref 135–145)

## 2022-02-17 LAB — URINALYSIS, ROUTINE W REFLEX MICROSCOPIC
Bacteria, UA: NONE SEEN
Bilirubin Urine: NEGATIVE
Glucose, UA: NEGATIVE mg/dL
Ketones, ur: NEGATIVE mg/dL
Leukocytes,Ua: NEGATIVE
Nitrite: NEGATIVE
Protein, ur: NEGATIVE mg/dL
Specific Gravity, Urine: 1.008 (ref 1.005–1.030)
pH: 6 (ref 5.0–8.0)

## 2022-02-17 LAB — TROPONIN I (HIGH SENSITIVITY)
Troponin I (High Sensitivity): 32 ng/L — ABNORMAL HIGH (ref <18)
Troponin I (High Sensitivity): 27 ng/L — ABNORMAL HIGH (ref <18)

## 2022-02-17 LAB — CBG MONITORING, ED: Glucose-Capillary: 95 mg/dL (ref 70–99)

## 2022-02-17 LAB — BRAIN NATRIURETIC PEPTIDE: B Natriuretic Peptide: 363.3 pg/mL — ABNORMAL HIGH (ref 0.0–100.0)

## 2022-02-17 MED ORDER — FUROSEMIDE 10 MG/ML IJ SOLN
40.0000 mg | Freq: Once | INTRAMUSCULAR | Status: AC
  Administered 2022-02-17: 40 mg via INTRAVENOUS
  Filled 2022-02-17: qty 4

## 2022-02-17 NOTE — ED Triage Notes (Signed)
Pt arrives POV For eval of dizziness and bradycardia. Wife reports pt had cardioversion on Monday for Afib, and noted that his HR has been in the 40s all week. Wife reports non-positional dizziness and shortness of breath. Pt denies chest pain, denies N/V, denies diaphoresis.

## 2022-02-17 NOTE — Telephone Encounter (Signed)
STAT if HR is under 50 or over 120 (normal HR is 60-100 beats per minute)  What is your heart rate? 42  Do you have a log of your heart rate readings (document readings)? no  Do you have any other symptoms? SOB, lightheaded, dizziness

## 2022-02-17 NOTE — Telephone Encounter (Signed)
Call came in to triage. Spoke with wife who stated patient had cardioversion thsi past Monday. Today, patient's monitor shows pulse in 40s. BP 100/54 with SOB and dizziness. Denies chest pain. Wife unable to take a radial pulse. Recommended patient be taken to the ED. Wife voiced understanding.

## 2022-02-17 NOTE — Discharge Instructions (Addendum)
Increase the Lasix to 1 tablet twice daily for the next 5 days. (You can take 2 tablets once daily if you prefer) follow-up with your cardiologist to make sure your symptoms are improving.  Return to the ER for chest pain shortness of breath other concerning symptoms

## 2022-02-17 NOTE — ED Provider Notes (Signed)
[facility] EMERGENCY DEPARTMENT Provider Note   CSN: [id number] Arrival date & time: 02/17/22  1219     History  Chief Complaint  Patient presents with  . Shortness of Breath  . Dizziness    Daniel Yu is a 88 y.o. male.   Shortness of Breath Dizziness Associated symptoms: shortness of breath    Pt had a cardioversion on Monday.  Pt states he has been having episodes of feeling lightheaded and short of breath off and on this past week since the procedure.  It does not occur with any specific activity. Pt states he has been taking his pulse and it has been low in the 40s.  Family called cardiology today and was told to come to the ED.  Pt denies any chest pain.  No leg swelling.   No cough.  No fevers or chills. Pt states he has been having these episodes off and on prior to the cardioversion over the years.  Pt was able to walk since and feels well.    Home Medications Prior to Admission medications   Medication Sig Start Date End Date Taking? Authorizing Provider  amiodarone (PACERONE) 200 MG tablet 1 tablet twice daily x 2 weeks Patient not taking: Reported on 02/08/2022 01/06/22   Lelon Perla, MD  amiodarone (PACERONE) 200 MG tablet Take 1 tablet (200 mg total) by mouth daily. 01/06/22   Lelon Perla, MD  Ascorbic Acid (VITAMIN C) 1000 MG tablet Take 1,000 mg by mouth in the morning.    [provider]  cetirizine (ZYRTEC) 10 MG tablet Take 10 mg by mouth in the morning.    [provider]  cholecalciferol (VITAMIN D3) 25 MCG (1000 UNIT) tablet Take 1,000 Units by mouth every evening.    [provider]  COVID-19 mRNA bivalent vaccine, Pfizer, (PFIZER COVID-19 VAC BIVALENT) injection Inject into the muscle. 07/05/21   Carlyle Basques, MD  COVID-19 mRNA Vac-TriS, Pfizer, (PFIZER-BIONT COVID-19 VAC-TRIS) SUSP injection Inject into the muscle. 12/08/20   Carlyle Basques, MD  diltiazem (CARDIZEM CD) 120 MG 24 hr capsule TAKE  1 CAPSULE(120 MG) BY MOUTH DAILY 01/18/22   Lelon Perla, MD  ELIQUIS 5 MG TABS tablet TAKE 1 TABLET(5 MG) BY MOUTH TWICE DAILY 10/27/21   Lelon Perla, MD  fluticasone (FLOVENT HFA) 110 MCG/ACT inhaler Inhale 1 puff into the lungs 2 (two) times daily.     [provider]  furosemide (LASIX) 20 MG tablet Take 1 tablet (20 mg total) by mouth daily. 01/09/22   Lelon Perla, MD  losartan (COZAAR) 50 MG tablet TAKE 1 TABLET(50 MG) BY MOUTH DAILY 09/22/21   Lelon Perla, MD  metoprolol tartrate (LOPRESSOR) 25 MG tablet Take 1 tablet (25 mg total) by mouth 2 (two) times daily. 01/06/22   Lelon Perla, MD  neomycin-bacitracin-polymyxin (NEOSPORIN) [phone] ointment Apply 1 Application topically 2 (two) times daily as needed (wound care).    [provider]      Allergies    Zestril [lisinopril] and Bee venom    Review of Systems   Review of Systems  Respiratory:  Positive for shortness of breath.   Neurological:  Positive for dizziness.    Physical Exam Updated Vital Signs BP (!) 163/83   Pulse 72   Temp 98.7 F (37.1 C) (Oral)   Resp (!) 24   Ht 1.803 m ('5\' 11"'$ )   Wt 88.5 kg   SpO2 95%   BMI 27.20  kg/m  Physical Exam Vitals and nursing note reviewed.  Constitutional:      General: He is not in acute distress.    Appearance: He is well-developed.  HENT:     Head: Normocephalic and atraumatic.     Right Ear: External ear normal.     Left Ear: External ear normal.  Eyes:     General: No scleral icterus.       Right eye: No discharge.        Left eye: No discharge.     Conjunctiva/sclera: Conjunctivae normal.  Neck:     Trachea: No tracheal deviation.  Cardiovascular:     Rate and Rhythm: Normal rate and regular rhythm.  Pulmonary:     Effort: Pulmonary effort is normal. No respiratory distress.     Breath sounds: Normal breath sounds. No stridor. No wheezing or rales.  Abdominal:     General: Bowel sounds are normal. There is no  distension.     Palpations: Abdomen is soft.     Tenderness: There is no abdominal tenderness. There is no guarding or rebound.  Musculoskeletal:        General: No tenderness or deformity.     Cervical back: Neck supple.  Skin:    General: Skin is warm and dry.     Findings: No rash.  Neurological:     General: No focal deficit present.     Mental Status: He is alert.     Cranial Nerves: No cranial nerve deficit (no facial droop, extraocular movements intact, no slurred speech).     Sensory: No sensory deficit.     Motor: No abnormal muscle tone or seizure activity.     Coordination: Coordination normal.  Psychiatric:        Mood and Affect: Mood normal.     ED Results / Procedures / Treatments   Labs (all labs ordered are listed, but only abnormal results are displayed) Labs Reviewed  BASIC METABOLIC PANEL - Abnormal; Notable for the following components:      Result Value   Sodium 134 (*)    Glucose, Bld 110 (*)    Calcium 8.8 (*)    GFR, Estimated 58 (*)    All other components within normal limits  CBC - Abnormal; Notable for the following components:   RBC 4.13 (*)    MCV 101.7 (*)    MCH 35.1 (*)    All other components within normal limits  URINALYSIS, ROUTINE W REFLEX MICROSCOPIC - Abnormal; Notable for the following components:   Color, Urine STRAW (*)    Hgb urine dipstick SMALL (*)    All other components within normal limits  BRAIN NATRIURETIC PEPTIDE - Abnormal; Notable for the following components:   B Natriuretic Peptide 363.3 (*)    All other components within normal limits  TROPONIN I (HIGH SENSITIVITY) - Abnormal; Notable for the following components:   Troponin I (High Sensitivity) 32 (*)    All other components within normal limits  TROPONIN I (HIGH SENSITIVITY) - Abnormal; Notable for the following components:   Troponin I (High Sensitivity) 27 (*)    All other components within normal limits  CBG MONITORING, ED    EKG None  Radiology DG  Chest 2 View  Result Date: 02/17/2022 CLINICAL DATA:  Dyspnea.  Shortness of breath and dizziness. EXAM: CHEST - 2 VIEW COMPARISON:  Chest radiograph 02/09/2017 FINDINGS: The heart is enlarged. Stable mediastinal contours. Diffuse interstitial and peribronchial thickening typical of pulmonary edema.  Small bilateral pleural effusions and fluid in the fissures. No confluent consolidation. No pneumothorax. Grossly stable osseous structures. IMPRESSION: Cardiomegaly with pulmonary edema and small pleural effusions consistent with CHF. Electronically Signed   By: Keith Rake M.D.   On: 02/17/2022 18:55    Procedures Procedures    Medications Ordered in ED Medications  furosemide (LASIX) injection 40 mg (40 mg Intravenous Given 02/17/22 2022)    ED Course/ Medical Decision Making/ A&P Clinical Course as of 02/17/22 2224  Fri Feb 17, 2022  1822 CBC(!) nl [JK]  1928 DG Chest 2 View Chest x-ray shows cardiomegaly and small pleural effusions consistent with CHF [JK]  2202 Patient had good urine output.  He is feeling much better. [JK]  2202 Brain natriuretic peptide(!) BNP elevated consistent with acute CHF exacerbation [JK]  9562 Basic metabolic panel(!) No significant electrolyte abnormalities [JK]  2221 Troponin I (High Sensitivity)(!) Troponin slightly elevated but stable [JK]    Clinical Course User Index [JK] Dorie Rank, MD                           Medical Decision Making Problems Addressed: Acute on chronic congestive heart failure, unspecified heart failure type [facility]): acute illness or injury that poses a threat to life or bodily functions  Amount and/or Complexity of Data Reviewed Labs: ordered. Decision-making details documented in ED Course. Radiology: ordered and independent interpretation performed. Decision-making details documented in ED Course.  Risk Prescription drug management. Drug therapy requiring intensive monitoring for toxicity. Decision regarding  hospitalization.   Patient presented to the ER for evaluation of intermittent episodes of shortness of breath.  Patient does have history of A-fib.  He also has history of CHF.  In the ER the patient was not having any acute difficulty with his breathing.  He was not having any chest pain.  Patient's laboratory work-up was suggestive of an acute CHF exacerbation.  Patient had slightly elevated troponins but they were stable.  Patient also has an elevated BNP and his chest x-ray suggested pulmonary edema.  I discussed the findings with the patient.  He does have a known history of CHF.  Patient is breathing easily and does not have an oxygen requirement.  Offered hospital admission for further treatment of a CHF exacerbation.  Also discussed the option of giving a dose of Lasix and seeing he if he responds.  Patient stated he preferred to go home.  He was given an IV dose of Lasix and had good urine output.  Patient states he is feeling much better right now.   He is not having any chest pain.  I have low suspicion for ACS.  He seems to be responding well to increased diuretic doses.  I think is reasonable to try discharge with close outpatient cardiology follow-up.  Warning signs and precautions discussed.         Final Clinical Impression(s) / ED Diagnoses Final diagnoses:  Acute on chronic congestive heart failure, unspecified heart failure type ([place])    Rx / DC Orders ED Discharge Orders          Ordered    Ambulatory referral to Cardiology       Comments: If you have not heard from the Cardiology office within the next 72 hours please call [phone].   02/17/22 2220              Dorie Rank, MD 02/17/22 2224

## 2022-02-17 NOTE — ED Provider Triage Note (Signed)
Emergency Medicine Provider Triage Evaluation Note  Daniel Yu , a 88 y.o. male  was evaluated in triage.  Pt complains of shortness of breath, dizziness, lightheadedness for the last 2 days on and off.  Patient had cardioversion on Monday due to A-fib.  He has had persistent A-fib versus paroxysmal A-fib for 2 years.  He takes amiodarone, diltiazem, Eliquis, furosemide, losartan, metoprolol.  He denies missing any doses or doubling any doses.  He denies any chest pain.  He denies any nausea, vomiting, fever, chills.  His shortness of breath, lightheadedness does not seem to be associated with exertion.  Review of Systems  Positive: Shortness of breath, lightheadedness, dizziness Negative: Chest pain, fever, chills  Physical Exam  BP 116/67 (BP Location: Right Arm)   Pulse (!) 45   Temp 98.7 F (37.1 C) (Oral)   Resp 18   Ht '5\' 11"'$  (1.803 m)   Wt 88 kg   SpO2 92%   BMI 27.06 kg/m  Gen:   Awake, no distress   Resp:  Normal effort  MSK:   Moves extremities without difficulty  Other:  Marked sinus bradycardia with occasional PVCs by auscultation.  Overall well-appearing patient with no diaphoresis, no pallor.  Medical Decision Making  Medically screening exam initiated at 12:50 PM.  Appropriate orders placed.  [name] was informed that the remainder of the evaluation will be completed by another provider, this initial triage assessment does not replace that evaluation, and the importance of remaining in the ED until their evaluation is complete.  Workup initiated   Anselmo Pickler, [place] 02/17/22 1253

## 2022-02-20 NOTE — Telephone Encounter (Signed)
Patient was seen in the ER

## 2022-02-22 ENCOUNTER — Telehealth: Payer: Self-pay | Admitting: Cardiology

## 2022-02-22 DIAGNOSIS — R0609 Other forms of dyspnea: Secondary | ICD-10-CM

## 2022-02-22 DIAGNOSIS — I480 Paroxysmal atrial fibrillation: Secondary | ICD-10-CM | POA: Diagnosis not present

## 2022-02-22 DIAGNOSIS — I10 Essential (primary) hypertension: Secondary | ICD-10-CM | POA: Diagnosis not present

## 2022-02-22 NOTE — Telephone Encounter (Signed)
Pt c/o medication issue:  1. Name of Medication:   furosemide (LASIX) 20 MG tablet    2. How are you currently taking this medication (dosage and times per day)?   3. Are you having a reaction (difficulty breathing--STAT)? NO  4. What is your medication issue? Pt's wife states that pt is done with the "double: dose of medication and wants to know where does he go from there.  Pt as following up from being In the hospital. Please advise

## 2022-02-22 NOTE — Telephone Encounter (Signed)
Left message for patient with Dr Youlanda Roys recommendations.   Order placed for bmp

## 2022-02-22 NOTE — Telephone Encounter (Signed)
Spoke with pt, he was on 40 mg of furosemide since discharge from the hospital. He was given the choice of being admitted or going home. He went home and his dose was doubled. He reports his weight is down 7-8 lbs since Friday. He reports feeling good today. His bp today was 103/49/43. Medications confirmed. The patient is not having any lightheadedness now but did prior to going into the hospital. Aware will forward to dr Stanford Breed to make him aware.

## 2022-02-23 DIAGNOSIS — R0609 Other forms of dyspnea: Secondary | ICD-10-CM | POA: Diagnosis not present

## 2022-02-23 LAB — BASIC METABOLIC PANEL
BUN/Creatinine Ratio: 21 (ref 10–24)
BUN: 26 mg/dL (ref 8–27)
CO2: 19 mmol/L — ABNORMAL LOW (ref 20–29)
Calcium: 9.5 mg/dL (ref 8.6–10.2)
Chloride: 100 mmol/L (ref 96–106)
Creatinine, Ser: 1.26 mg/dL (ref 0.76–1.27)
Glucose: 67 mg/dL — ABNORMAL LOW (ref 70–99)
Potassium: 4.8 mmol/L (ref 3.5–5.2)
Sodium: 135 mmol/L (ref 134–144)
eGFR: 55 mL/min/{1.73_m2} — ABNORMAL LOW (ref >59)

## 2022-02-28 DIAGNOSIS — I5189 Other ill-defined heart diseases: Secondary | ICD-10-CM | POA: Diagnosis not present

## 2022-02-28 DIAGNOSIS — I480 Paroxysmal atrial fibrillation: Secondary | ICD-10-CM | POA: Diagnosis not present

## 2022-02-28 DIAGNOSIS — I10 Essential (primary) hypertension: Secondary | ICD-10-CM | POA: Diagnosis not present

## 2022-03-02 ENCOUNTER — Telehealth: Payer: Self-pay

## 2022-03-02 ENCOUNTER — Telehealth: Payer: Self-pay | Admitting: Cardiology

## 2022-03-02 NOTE — Telephone Encounter (Signed)
Spoke to patient's wife.Stated she is worried about her husband.For the past 1 week he has been feeling lightheaded,sob.Stated he went to [facility] ED 6/30.Stated his B/P has been low.B/P 1 hour ago 91/49 94/53 pulse 43,40.Stated husband is taking a nap at present.She wanted to ask Dr.Crenshaw if he needs to decrease one of his medications.Appointment scheduled with Almyra Deforest PA 7/17 at 2:45 pm.I will send message to [facility] for advice.

## 2022-03-02 NOTE — Telephone Encounter (Signed)
Spoke to patient advised Laurann Montana NP has had a cancellation on 7/14.Appointment scheduled with her at 2:20 pm at [place] [facility] location.

## 2022-03-02 NOTE — Telephone Encounter (Signed)
Pt c/o BP issue: STAT if pt c/o blurred vision, one-sided weakness or slurred speech  1. What are your last 5 BP readings?  7/5 2:30pm  103/49  43 7/10 3:30pm  138/62  48 7/12 11:30am  91/63    43 103/59  46 100/57  40 (she took it three times) 7/12 3:30pm 116/66  44 7/13  am 121/55  42 7/13  pm 119/60  40    2. Are you having any other symptoms (ex. Dizziness, headache, blurred vision, passed out)? lightheaded  3. What is your BP issue? Wife stated his BP is running low.  She states he was in the emergency room for the saw thing back on June 30th.

## 2022-03-03 ENCOUNTER — Ambulatory Visit (HOSPITAL_BASED_OUTPATIENT_CLINIC_OR_DEPARTMENT_OTHER): Payer: Medicare Other | Admitting: Family

## 2022-03-03 ENCOUNTER — Encounter (HOSPITAL_BASED_OUTPATIENT_CLINIC_OR_DEPARTMENT_OTHER): Payer: Self-pay | Admitting: Family

## 2022-03-03 VITALS — BP 115/71 | HR 67 | Ht 71.0 in | Wt 195.8 lb

## 2022-03-03 DIAGNOSIS — I428 Other cardiomyopathies: Secondary | ICD-10-CM

## 2022-03-03 DIAGNOSIS — I10 Essential (primary) hypertension: Secondary | ICD-10-CM | POA: Diagnosis not present

## 2022-03-03 DIAGNOSIS — R42 Dizziness and giddiness: Secondary | ICD-10-CM | POA: Diagnosis not present

## 2022-03-03 DIAGNOSIS — D6859 Other primary thrombophilia: Secondary | ICD-10-CM | POA: Diagnosis not present

## 2022-03-03 DIAGNOSIS — I4819 Other persistent atrial fibrillation: Secondary | ICD-10-CM | POA: Diagnosis not present

## 2022-03-03 NOTE — Patient Instructions (Addendum)
Medication Instructions:  Continue your current medications.   Loel Dubonnet, NP will send you a MyChart message on Thursday to check in on your blood pressure. If it is still low (top number consistently less than 110) we may consider reducing your Losartan.   *If you need a refill on your cardiac medications before your next appointment, please call your pharmacy*   Lab Work: None ordered today.   Testing/Procedures: Your EKG today showed normal sinus rhythm.    Follow-Up: At [facility], you and your health needs are our priority.  As part of our continuing mission to provide you with exceptional heart care, we have created designated Provider Care Teams.  These Care Teams include your primary Cardiologist (physician) and Advanced Practice Providers (APPs -  Physician Assistants and Nurse Practitioners) who all work together to provide you with the care you need, when you need it.  We recommend signing up for the patient portal called "MyChart".  Sign up information is provided on this After Visit Summary.  MyChart is used to connect with patients for Virtual Visits (Telemedicine).  Patients are able to view lab/test results, encounter notes, upcoming appointments, etc.  Non-urgent messages can be sent to your provider as well.   To learn more about what you can do with MyChart, go to [email or web address].    Your next appointment:   As scheduled with Dr. Stanford Breed   Other Instructions  Tips to Measure your Blood Pressure Correctly  Here's what you can do to ensure a correct reading:  Don't drink a caffeinated beverage or smoke during the 30 minutes before the test.  Sit quietly for five minutes before the test begins.  During the measurement, sit in a chair with your feet on the floor and your arm supported so your elbow is at about heart level.  The inflatable part of the cuff should completely cover at least 80% of your upper arm, and the cuff should be placed on bare  skin, not over a shirt.  Don't talk during the measurement.  Blood pressure categories  Blood pressure category SYSTOLIC (upper number)  DIASTOLIC (lower number)  Normal Less than 120 mm Hg and Less than 80 mm Hg  Elevated 120-129 mm Hg and Less than 80 mm Hg  High blood pressure: Stage 1 hypertension 130-139 mm Hg or 80-89 mm Hg  High blood pressure: Stage 2 hypertension 140 mm Hg or higher or 90 mm Hg or higher  Hypertensive crisis (consult your doctor immediately) Higher than 180 mm Hg and/or Higher than 120 mm Hg  Source: American Heart Association and American Stroke Association. For more on getting your blood pressure under control, buy Controlling Your Blood Pressure, a Special Health Report from [facility].   Blood Pressure Log   Date   Time  Blood Pressure  Example: Nov 1 9 AM 124/78                                               Heart Healthy Diet Recommendations: A low-salt diet is recommended. Meats should be grilled, baked, or boiled. Avoid fried foods. Focus on lean protein sources like fish or chicken with vegetables and fruits. The American Heart Association is a Microbiologist!  American Heart Association Diet and Lifeystyle Recommendations   Exercise recommendations: The American Heart Association recommends 150 minutes of moderate intensity  exercise weekly. Try 30 minutes of moderate intensity exercise 4-5 times per week. This could include walking, jogging, or swimming.

## 2022-03-03 NOTE — Progress Notes (Signed)
Office Visit    Patient Name: Daniel Yu Date of Encounter: 03/03/2022  PCP:  Lavone Orn, MD   [facility]  Cardiologist:  Kirk Ruths, MD  Advanced Practice Provider:  No care team member to display Electrophysiologist:  None     Chief Complaint    Daniel Yu is a 88 y.o. male  presents today for lightheadedness   Past Medical History    Past Medical History:  Diagnosis Date   Arthritis    right knee oa   Asthma    Carpal tunnel syndrome    BILATERAL   Cholecystitis    Complication of anesthesia    adverse reaction to the spinal   Hypertension    PAF (paroxysmal atrial fibrillation) ([place])    SBO (small bowel obstruction) ([place]) 12/2015   Past Surgical History:  Procedure Laterality Date   CARDIOVERSION N/A 02/14/2017   Procedure: CARDIOVERSION;  Surgeon: Josue Hector, MD;  Location: [place];  Service: Cardiovascular;  Laterality: N/A;   CARDIOVERSION N/A 12/11/2019   Procedure: CARDIOVERSION;  Surgeon: Lelon Perla, MD;  Location: [place];  Service: Cardiovascular;  Laterality: N/A;   CARDIOVERSION N/A 02/13/2022   Procedure: CARDIOVERSION;  Surgeon: Lelon Perla, MD;  Location: [facility] ENDOSCOPY;  Service: Cardiovascular;  Laterality: N/A;   CHOLECYSTECTOMY  02/09/2017   CHOLECYSTECTOMY N/A 02/09/2017   Procedure: LAPAROSCOPIC CHOLECYSTECTOMY WITH INTRAOPERATIVE CHOLANGIOGRAM;  Surgeon: Donnie Mesa, MD;  Location: [place];  Service: General;  Laterality: N/A;   COLONOSCOPY WITH PROPOFOL N/A 11/09/2014   Procedure: COLONOSCOPY WITH PROPOFOL;  Surgeon: Garlan Fair, MD;  Location: [facility];  Service: Endoscopy;  Laterality: N/A;   HERNIA REPAIR     2013   QUADRICEPS TENDON REPAIR Left 06/07/2015   Procedure: REPAIR LEFT QUADRICEP TENDON;  Surgeon: Gaynelle Arabian, MD;  Location: [facility];  Service: Orthopedics;  Laterality: Left;   TEE WITHOUT CARDIOVERSION N/A 02/14/2017   Procedure: TRANSESOPHAGEAL  ECHOCARDIOGRAM (TEE);  Surgeon: Josue Hector, MD;  Location: [facility] ENDOSCOPY;  Service: Cardiovascular;  Laterality: N/A;   TOTAL KNEE ARTHROPLASTY Right 12/27/2015   Procedure: RIGHT TOTAL KNEE ARTHROPLASTY;  Surgeon: Gaynelle Arabian, MD;  Location: [facility];  Service: Orthopedics;  Laterality: Right;    Allergies  Allergies  Allergen Reactions   Zestril [Lisinopril] Other (See Comments)    Unknown reaction type   Bee Venom Swelling and Rash    History of Present Illness    Daniel Yu is a 88 y.o. male with a hx of atrial fibrillation, HFrEF, MR, cholecystitis last seen for cardioversion..  Initial diagnosis atrial fib June 2018. Echo EF 45-50%, mild MR/TR. Successful TEE guided cardioversion 02/14/17. Myoview 02/2017 EF 42%, no ischemia. Repeat cardioversion 11/2019. Echo 04/2021 normal LVEF, mild RV enlargement, mild RV dysfunction, biatrial enlargement, mild-mod MR.   Seen 01/06/22 with worsening dyspnea. Due to concern atrial fib was contributory, was initiated on Amiodarone and underwent cardioversion 02/13/22. ED visit 02/17/22 elevated BNP and CXR with pulmonary edema. He was given IV Lasix.   Presents today for follow up with his wife. Tells me the first week after cardioversion felt well. Then had some lightheadedness with heart rate and BP were low. Dyspnea has improved. Was recommended to stop Diltaizem, Metoprolol which he just did this morning. BP at home often 110s/60s. His heart rate at home is often reading in the 40s.  He is weighing himself every day. Works out at [employer]. Drinks less than 2L fluid  per day. Following a low sodium diet.   EKGs/Labs/Other Studies Reviewed:   The following studies were reviewed today:  EKG:  EKG is ordered today.  The ekg ordered today demonstrates NSR 67 bpm with no acute ST/T wave changes.   Recent Labs: 01/06/2022: NT-Pro BNP 1,882 02/17/2022: B Natriuretic Peptide 363.3; Hemoglobin 14.5; Platelets 248 02/23/2022: BUN 26; Creatinine, Ser 1.26;  Potassium 4.8; Sodium 135  Recent Lipid Panel No results found for: "CHOL", "TRIG", "HDL", "CHOLHDL", "VLDL", "LDLCALC", "LDLDIRECT"  Risk Assessment/Calculations:   CHA2DS2-VASc Score = 4   This indicates a 4.8% annual risk of stroke. The patient's score is based upon: CHF History: 1 HTN History: 1 Diabetes History: 0 Stroke History: 0 Vascular Disease History: 0 Age Score: 2 Gender Score: 0     Home Medications   Current Meds  Medication Sig   amiodarone (PACERONE) 200 MG tablet Take 1 tablet (200 mg total) by mouth daily.   Ascorbic Acid (VITAMIN C) 1000 MG tablet Take 1,000 mg by mouth in the morning.   cetirizine (ZYRTEC) 10 MG tablet Take 10 mg by mouth in the morning.   cholecalciferol (VITAMIN D3) 25 MCG (1000 UNIT) tablet Take 1,000 Units by mouth every evening.   COVID-19 mRNA bivalent vaccine, Pfizer, (PFIZER COVID-19 VAC BIVALENT) injection Inject into the muscle.   COVID-19 mRNA Vac-TriS, Pfizer, (PFIZER-BIONT COVID-19 VAC-TRIS) SUSP injection Inject into the muscle.   ELIQUIS 5 MG TABS tablet TAKE 1 TABLET(5 MG) BY MOUTH TWICE DAILY   fluticasone (FLOVENT HFA) 110 MCG/ACT inhaler Inhale 1 puff into the lungs 2 (two) times daily.    furosemide (LASIX) 20 MG tablet Take 1 tablet (20 mg total) by mouth daily.   losartan (COZAAR) 50 MG tablet TAKE 1 TABLET(50 MG) BY MOUTH DAILY   neomycin-bacitracin-polymyxin (NEOSPORIN) [id number] ointment Apply 1 Application topically 2 (two) times daily as needed (wound care).     Review of Systems      All other systems reviewed and are otherwise negative except as noted above.  Physical Exam    VS:  BP 115/71 (BP Location: Left Arm, Patient Position: Sitting, Cuff Size: Normal)   Pulse 67   Ht '5\' 11"'$  (1.803 m)   Wt 195 lb 12.8 oz (88.8 kg)   BMI 27.31 kg/m  , BMI Body mass index is 27.31 kg/m.  Wt Readings from Last 3 Encounters:  03/03/22 195 lb 12.8 oz (88.8 kg)  02/17/22 195 lb (88.5 kg)  02/13/22 192 lb (87.1  kg)     GEN: Well nourished, well developed, in no acute distress. HEENT: normal. Neck: Supple, no JVD, carotid bruits, or masses. Cardiac: RRR, no murmurs, rubs, or gallops. No clubbing, cyanosis, edema.  Radials/PT 2+ and equal bilaterally.  Respiratory:  Respirations regular and unlabored, clear to auscultation bilaterally. GI: Soft, nontender, nondistended. MS: No deformity or atrophy. Skin: Warm and dry, no rash. Neuro:  Strength and sensation are intact. Psych: Normal affect.  Assessment & Plan    Persistent atrial fib / Hypercoagulable state / On Amiodarone therapy - maintaining SR post cardioversion. Had bradycardia and hypotension at home - Metoprolol and Diltaizem just stopped this morning. Continue Amiodarone '200mg'$  QD. Tolerating well. Consider amiodarone labs at follow up. Continue Eliquis '5mg'$  BID. Does not meet dose reduction criteria.   HFpEF - Euvolemic and well compensated on exam. Continue Lasix '20mg'$  QD. Low sodium diet, fluid restriction <2L, and daily weights encouraged. Educated to contact our office for weight gain of 2 lbs overnight or  5 lbs in one week.   HTN - Now with hypotension. Metoprolol and Diltaizem have been discontinued. He will monitor BP once per day. Check in via MyChart next week. If BP persistently low, reduce Losartan dose.   Cardiomyopathy - EF improved by most recent echo. Euvolemic and well compensated on exam.   Disposition: Follow up  as scheduled  with Kirk Ruths, MD   Signed, Loel Dubonnet, NP 03/03/2022, 9:27 PM [place]

## 2022-03-03 NOTE — Telephone Encounter (Signed)
Spoke with pt wife, Aware of dr crenshaw's recommendations.  

## 2022-03-06 ENCOUNTER — Ambulatory Visit: Payer: Medicare Other | Admitting: Physician Assistant

## 2022-03-07 NOTE — Progress Notes (Signed)
HPI: Follow-up atrial fibrillation. Patient with history of PAF. Nuclear study July 2018 showed ejection fraction 42%. There was no ischemia. Echocardiogram September 2022 showed normal LV function, mild right ventricular enlargement, mild RV dysfunction, biatrial enlargement, mild to moderate mitral regurgitation.   Underwent successful cardioversion February 13, 2022 on amiodarone.  Patient subsequently seen in the emergency room on June 30 with complaints of dyspnea and lightheadedness.  Heart rate was in the 40s.  Patient was given IV Lasix with improvement in symptoms.  Cardizem and metoprolol discontinued.  Since last seen, there is no dyspnea, chest pain, palpitations or syncope.  Current Outpatient Medications  Medication Sig Dispense Refill   amiodarone (PACERONE) 200 MG tablet Take 1 tablet (200 mg total) by mouth daily. 90 tablet 3   Ascorbic Acid (VITAMIN C) 1000 MG tablet Take 1,000 mg by mouth in the morning.     cetirizine (ZYRTEC) 10 MG tablet Take 10 mg by mouth in the morning.     cholecalciferol (VITAMIN D3) 25 MCG (1000 UNIT) tablet Take 1,000 Units by mouth every evening.     COVID-19 mRNA bivalent vaccine, Pfizer, (PFIZER COVID-19 VAC BIVALENT) injection Inject into the muscle. 0.3 mL 0   COVID-19 mRNA Vac-TriS, Pfizer, (PFIZER-BIONT COVID-19 VAC-TRIS) SUSP injection Inject into the muscle. 0.3 mL 0   ELIQUIS 5 MG TABS tablet TAKE 1 TABLET(5 MG) BY MOUTH TWICE DAILY 180 tablet 1   fluticasone (FLOVENT HFA) 110 MCG/ACT inhaler Inhale 1 puff into the lungs 2 (two) times daily.      furosemide (LASIX) 20 MG tablet Take 1 tablet (20 mg total) by mouth daily. 90 tablet 3   losartan (COZAAR) 50 MG tablet TAKE 1 TABLET(50 MG) BY MOUTH DAILY 90 tablet 3   neomycin-bacitracin-polymyxin (NEOSPORIN) [phone] ointment Apply 1 Application topically 2 (two) times daily as needed (wound care).     No current facility-administered medications for this visit.     Past Medical  History:  Diagnosis Date   Arthritis    right knee oa   Asthma    Carpal tunnel syndrome    BILATERAL   Cholecystitis    Complication of anesthesia    adverse reaction to the spinal   Hypertension    PAF (paroxysmal atrial fibrillation) ([facility])    SBO (small bowel obstruction) ([place]) 12/2015    Past Surgical History:  Procedure Laterality Date   CARDIOVERSION N/A 02/14/2017   Procedure: CARDIOVERSION;  Surgeon: Josue Hector, MD;  Location: [place];  Service: Cardiovascular;  Laterality: N/A;   CARDIOVERSION N/A 12/11/2019   Procedure: CARDIOVERSION;  Surgeon: Lelon Perla, MD;  Location: [place];  Service: Cardiovascular;  Laterality: N/A;   CARDIOVERSION N/A 02/13/2022   Procedure: CARDIOVERSION;  Surgeon: Lelon Perla, MD;  Location[facility] ENDOSCOPY;  Service: Cardiovascular;  Laterality: N/A;   CHOLECYSTECTOMY  02/09/2017   CHOLECYSTECTOMY N/A 02/09/2017   Procedure: LAPAROSCOPIC CHOLECYSTECTOMY WITH INTRAOPERATIVE CHOLANGIOGRAM;  Surgeon: Donnie Mesa, MD;  Location: [place];  Service: General;  Laterality: N/A;   COLONOSCOPY WITH PROPOFOL N/A 11/09/2014   Procedure: COLONOSCOPY WITH PROPOFOL;  Surgeon: Garlan Fair, MD;  [facility];  Service: Endoscopy;  Laterality: N/A;   HERNIA REPAIR     2013   QUADRICEPS TENDON REPAIR Left 06/07/2015   Procedure: REPAIR LEFT QUADRICEP TENDON;  Surgeon: Gaynelle Arabian, MD;  Location: [facility];  Service: Orthopedics;  Laterality: Left;   TEE WITHOUT CARDIOVERSION N/A 02/14/2017   Procedure: TRANSESOPHAGEAL ECHOCARDIOGRAM (TEE);  Surgeon: Josue Hector, MD;  [facility] ENDOSCOPY;  Service: Cardiovascular;  Laterality: N/A;   TOTAL KNEE ARTHROPLASTY Right 12/27/2015   Procedure: RIGHT TOTAL KNEE ARTHROPLASTY;  Surgeon: Gaynelle Arabian, MD;  Location: [facility];  Service: Orthopedics;  Laterality: Right;    Social History   Socioeconomic History   Marital status: Married    Spouse name: Not on file   Number of  children: Not on file   Years of education: Not on file   Highest education level: Not on file  Occupational History   Not on file  Tobacco Use   Smoking status: Never   Smokeless tobacco: Never  Vaping Use   Vaping Use: Never used  Substance and Sexual Activity   Alcohol use: Yes    Comment:  2 beers daily   Drug use: No   Sexual activity: Not on file  Other Topics Concern   Not on file  Social History Narrative   Not on file   Social Determinants of Health   Financial Resource Strain: Not on file  Food Insecurity: Not on file  Transportation Needs: Not on file  Physical Activity: Not on file  Stress: Not on file  Social Connections: Not on file  Intimate Partner Violence: Not on file    Family History  Problem Relation Age of Onset   Hypertension Father     ROS: no fevers or chills, productive cough, hemoptysis, dysphasia, odynophagia, melena, hematochezia, dysuria, hematuria, rash, seizure activity, orthopnea, PND, pedal edema, claudication. Remaining systems are negative.  Physical Exam: Well-developed well-nourished in no acute distress.  Skin is warm and dry.  HEENT is normal.  Neck is supple.  Chest is clear to auscultation with normal expansion.  Cardiovascular exam is regular rate and rhythm.  Abdominal exam nontender or distended. No masses palpated. Extremities show no edema. neuro grossly intact   A/P  1 paroxysmal atrial fibrillation-patient is maintaining sinus rhythm on exam following cardioversion.  We will continue amiodarone at present dose.  He was seen in follow-up and noted to be bradycardic.  Heart rate is improved and we will continue off of metoprolol and Cardizem.  Continue apixaban.  When I see him back in 4 to 6 months we will check liver functions, TSH and chest x-ray.  2 chronic diastolic congestive heart failure-he is euvolemic on examination.  We will continue Lasix at present dose.  3 hypertension-his blood pressure appears to be  running high at home.  However today it is controlled.  They will follow and I have asked him to increase his losartan to 100 mg daily if it runs in the 150 range.  They will bring his cuff to next office visit to correlate with ours.  4 history of cardiomyopathy-LV function has normalized on most recent echocardiogram.  Kirk Ruths, MD

## 2022-03-10 ENCOUNTER — Telehealth: Payer: Self-pay | Admitting: Cardiology

## 2022-03-10 NOTE — Telephone Encounter (Signed)
Spoke with with Wife-she was worried about pt's BP/HR numbers informed her that these numbers are ok and not to be worried. Verbalized understanding.

## 2022-03-10 NOTE — Telephone Encounter (Signed)
Pt c/o BP issue: STAT if pt c/o blurred vision, one-sided weakness or slurred speech  1. What are your last 5 BP readings? 135/82 hr 82;  128/89 HR 86; 105/64 hr 91; 107/72 hr 89; 134/70hr 77; 132/109  2. Are you having any other symptoms (ex. Dizziness, headache, blurred vision, passed out)? no  3. What is your BP issue?

## 2022-03-12 ENCOUNTER — Other Ambulatory Visit: Payer: Self-pay | Admitting: Cardiology

## 2022-03-13 NOTE — Telephone Encounter (Signed)
Prescription refill request for Eliquis received.  Indication: afib  Last office visit: Daniel Yu, 03/03/2022 Scr: 1.26, 02/23/2022 Age: 88 yo  Weight: 88.8 kg    Refill sent.

## 2022-03-15 ENCOUNTER — Ambulatory Visit: Payer: Medicare Other | Attending: Internal Medicine

## 2022-03-15 DIAGNOSIS — Z23 Encounter for immunization: Secondary | ICD-10-CM

## 2022-03-20 ENCOUNTER — Ambulatory Visit: Payer: Medicare Other | Admitting: Cardiology

## 2022-03-20 ENCOUNTER — Encounter: Payer: Self-pay | Admitting: Cardiology

## 2022-03-20 VITALS — BP 118/70 | HR 77 | Ht 71.0 in | Wt 193.6 lb

## 2022-03-20 DIAGNOSIS — I428 Other cardiomyopathies: Secondary | ICD-10-CM

## 2022-03-20 DIAGNOSIS — I480 Paroxysmal atrial fibrillation: Secondary | ICD-10-CM

## 2022-03-20 DIAGNOSIS — I10 Essential (primary) hypertension: Secondary | ICD-10-CM

## 2022-03-20 NOTE — Patient Instructions (Signed)
  Follow-Up: At [facility], you and your health needs are our priority.  As part of our continuing mission to provide you with exceptional heart care, we have created designated Provider Care Teams.  These Care Teams include your primary Cardiologist (physician) and Advanced Practice Providers (APPs -  Physician Assistants and Nurse Practitioners) who all work together to provide you with the care you need, when you need it.  We recommend signing up for the patient portal called "MyChart".  Sign up information is provided on this After Visit Summary.  MyChart is used to connect with patients for Virtual Visits (Telemedicine).  Patients are able to view lab/test results, encounter notes, upcoming appointments, etc.  Non-urgent messages can be sent to your provider as well.   To learn more about what you can do with MyChart, go to [email or web address].    Your next appointment:   4-6 month(s)  The format for your next appointment:   In Person  Provider:   Kirk Ruths, MD      Important Information About Sugar

## 2022-03-22 DIAGNOSIS — I10 Essential (primary) hypertension: Secondary | ICD-10-CM | POA: Diagnosis not present

## 2022-03-22 DIAGNOSIS — I480 Paroxysmal atrial fibrillation: Secondary | ICD-10-CM | POA: Diagnosis not present

## 2022-03-28 ENCOUNTER — Other Ambulatory Visit: Payer: Self-pay

## 2022-03-28 ENCOUNTER — Telehealth: Payer: Self-pay | Admitting: Cardiology

## 2022-03-28 MED ORDER — AMLODIPINE BESYLATE 5 MG PO TABS: 5.0000 mg | ORAL_TABLET | Freq: Every day | ORAL | 3 refills | Status: DC

## 2022-03-28 NOTE — Telephone Encounter (Signed)
Called patient wife back- advised of message below.  RX sent to pharmacy.  They will monitor BP and call back with an update.   Thanks!

## 2022-03-28 NOTE — Telephone Encounter (Signed)
Left message for pts wife to call back. 

## 2022-03-28 NOTE — Telephone Encounter (Signed)
Pt c/o BP issue: STAT if pt c/o blurred vision, one-sided weakness or slurred speech  1. What are your last 5 BP readings?  08/08 AM - 159/87  HR 82   08/07 AM - 125/73  HR 79  08/06 AM - 120/77  HR 81 PM - 155/86 HR 81 08/05 1 PM - 137/74 HR 74 /5 PM 154/85 HR 70  2. Are you having any other symptoms (ex. Dizziness, headache, blurred vision, passed out)? No  3. What is your BP issue? Pt was taken off some medication due to having low bp, but now the wife says that his bp seems to be spiking in the evening time.

## 2022-03-28 NOTE — Telephone Encounter (Signed)
Called patient wife back- advised that they seen Dr.Crenshaw on 07/31- the Metoprolol and Cardizem were discontinued. Per note- Dr.Crenshaw did advise if the BP increased to over 150- they could go to Losartan 100 mg. Patient wife states they were having high BP readings in the morning before medications, but then after he takes his medications it will bring it down during the day. However around 4:00-5:00 PM they are noticing the BP increases to 150/80's- this has been consistent. She did not want to call until they tried to Losartan for around 1 week (which is today). She would like to know if Dr.Crenshaw recommends anything else.   Patient denies any symptoms.   Will route to MD. Thanks!

## 2022-03-28 NOTE — Telephone Encounter (Signed)
Pt's wife is returning call. Requesting call back.  

## 2022-03-30 DIAGNOSIS — H0100B Unspecified blepharitis left eye, upper and lower eyelids: Secondary | ICD-10-CM | POA: Diagnosis not present

## 2022-03-30 DIAGNOSIS — H02831 Dermatochalasis of right upper eyelid: Secondary | ICD-10-CM | POA: Diagnosis not present

## 2022-03-30 DIAGNOSIS — H0100A Unspecified blepharitis right eye, upper and lower eyelids: Secondary | ICD-10-CM | POA: Diagnosis not present

## 2022-03-30 DIAGNOSIS — H10413 Chronic giant papillary conjunctivitis, bilateral: Secondary | ICD-10-CM | POA: Diagnosis not present

## 2022-04-05 ENCOUNTER — Telehealth: Payer: Self-pay | Admitting: Cardiology

## 2022-04-05 NOTE — Telephone Encounter (Signed)
Spouse calling back with pt's BP reading as requested by nurse. Please advise   8/11  153/87 - Morning 147/80 - Afternoon  8/13 143/79 - Morning  8/15 147/85 - Morning 155/77 - Afternoon  8/16 145/75 - This morning

## 2022-04-05 NOTE — Telephone Encounter (Signed)
Returned the call to the patient. He was sending in his blood pressure readings after adding the Amlodipine 5 mg once daily. The patient has been checking his blood pressure immediately after taking his medications. He has been advised to check his blood pressure 1-2 hours after taking his medication and not to continuously take it after the first reading. His blood pressure this morning, pre meds, was 145/70 at 7:30. While on the phone it was 126/70.   Patient is taking: Losartan 100 mg (increased 8/1) Amlodipine 5 mg (started 8/8) Amiodarone 200 mg once daily Furosemide 20 mg once daily  The patient is concerned about his elevated blood pressures in the afternoon.

## 2022-04-05 NOTE — Telephone Encounter (Signed)
The patient's wife has been made aware. They will keep a log and call back in 2 weeks   Continue present dose of amlodipine and continue to monitor for 2 weeks.  We can increase amlodipine to 10 mg daily if his blood pressure runs high.  Kirk Ruths

## 2022-04-25 ENCOUNTER — Telehealth: Payer: Self-pay | Admitting: Cardiology

## 2022-04-25 NOTE — Telephone Encounter (Signed)
Wife stated afternoon Bps are 163/85, 146/76, 148/82. P 69-74. Per C. Pavero, patient can take amlodipine in the evenings. Spouse advised and will continue with BP diary.

## 2022-04-25 NOTE — Telephone Encounter (Signed)
  Pt c/o BP issue: STAT if pt c/o blurred vision, one-sided weakness or slurred speech  1. What are your last 5 BP readings?   163/85 hr 74 146/76 hr 74 148/82 hr 69   2. Are you having any other symptoms (ex. Dizziness, headache, blurred vision, passed out)? No symptoms  3. What is your BP issue? Wife is calling stating that his BP is fluctuating. She has been giving him 2 of his Losartan a day, 2 - 50 mg pills. She was told they could double his amlodipine but she has not done that. She would like to discuss the options.

## 2022-04-26 NOTE — Telephone Encounter (Signed)
Wife stated that yesterday morning, patient took amlodipine '5mg'$  and in the evening '5mg'$ .  Patient's SBP this morning was 160, then later in the morning was in the "normal range."  Patient will begin amlodipine '10mg'$  at bedtime this evening. She wanted to change the time for the eliquis (6pm) to give with amlodipine. I explained eliquis should be taken 12 hours apart; she will keep current eliquis schedule. Wife will contact clinic with BP readings either tomorrow or Friday.

## 2022-04-27 ENCOUNTER — Telehealth: Payer: Self-pay | Admitting: Cardiology

## 2022-04-27 NOTE — Telephone Encounter (Signed)
Patient's wife called to give BP readings for today:  This morning:  148/78 73 Noon:   153/?? He didn't write it down 3:30pm: 148/78 65

## 2022-04-27 NOTE — Telephone Encounter (Signed)
Wife stated patient took amlodipine '10mg'$  last night. Patient's BP this AM was 148/78, P 73; noon 153/??; 3:30 PM 148/78, P 65. Denies headache, chest pain, dizziness.

## 2022-04-28 NOTE — Telephone Encounter (Signed)
Left message for patient with Dr Creshaw's recommendations.   

## 2022-05-09 ENCOUNTER — Other Ambulatory Visit (HOSPITAL_BASED_OUTPATIENT_CLINIC_OR_DEPARTMENT_OTHER): Payer: Self-pay

## 2022-05-09 MED ORDER — INFLUENZA VAC A&B SA ADJ QUAD 0.5 ML IM PRSY
PREFILLED_SYRINGE | INTRAMUSCULAR | 0 refills | Status: DC
  Filled 2022-05-09: qty 0.5, 1d supply, fill #0

## 2022-05-11 ENCOUNTER — Other Ambulatory Visit (HOSPITAL_COMMUNITY): Payer: Self-pay | Admitting: *Deleted

## 2022-05-11 ENCOUNTER — Telehealth: Payer: Self-pay | Admitting: Cardiology

## 2022-05-11 ENCOUNTER — Other Ambulatory Visit: Payer: Self-pay | Admitting: *Deleted

## 2022-05-11 DIAGNOSIS — R0609 Other forms of dyspnea: Secondary | ICD-10-CM

## 2022-05-11 NOTE — Telephone Encounter (Signed)
Spouse would like a callback regarding pt's BP readings as requested. Please advise

## 2022-05-11 NOTE — Telephone Encounter (Signed)
Left message for pt to call.

## 2022-05-11 NOTE — Telephone Encounter (Signed)
Spoke with pt wife, she called to report his blood pressure is running in the 150's average most of the time. He is currently taking 10 mg of amlodipine and 100 mg of losartan and furosemide 20 mg once daily. Will forward for dr Stanford Breed review

## 2022-05-12 ENCOUNTER — Telehealth (HOSPITAL_COMMUNITY): Payer: Self-pay | Admitting: *Deleted

## 2022-05-12 MED ORDER — HYDRALAZINE HCL 25 MG PO TABS: 25.0000 mg | ORAL_TABLET | Freq: Two times a day (BID) | ORAL | 4 refills | Status: DC

## 2022-05-12 MED ORDER — LOSARTAN POTASSIUM 100 MG PO TABS: 100.0000 mg | ORAL_TABLET | Freq: Every day | ORAL | 3 refills | Status: DC

## 2022-05-12 MED ORDER — AMLODIPINE BESYLATE 10 MG PO TABS: 10.0000 mg | ORAL_TABLET | Freq: Every day | ORAL | 3 refills | Status: DC

## 2022-05-12 NOTE — Telephone Encounter (Signed)
Returning patient's wife's call regarding upcoming cardiac imaging study; pt's wife verbalizes understanding of appt date/time, parking situation and where to check in, pre-test NPO status verified current allergies; name and call back number provided for further questions should they arise  Gordy Clement RN Navigator Card[facility] [phone] office [phone] cell  Patient wife is aware the patient is to avoid caffeine 12 hours prior his cardiac PET scan.

## 2022-05-12 NOTE — Telephone Encounter (Signed)
Follow Up:    Wife is returning Daniel Yu's call from yesterday.

## 2022-05-12 NOTE — Telephone Encounter (Signed)
Per Dr Stanford Breed--   Add hydralazine 25 mg BID and follow BP.  Kirk Ruths      Spoke to patient's wife. Information given - wife verbalized understanding.  Patient's wife requests  refill  Losartan 100 mg , Amlodipine 10 mg . She states medication were  increased  previously but a new prescription is needed.   Refills done.

## 2022-05-16 ENCOUNTER — Encounter (HOSPITAL_COMMUNITY)
Admission: RE | Admit: 2022-05-16 | Discharge: 2022-05-16 | Disposition: A | Discharge status: Home / Self Care | Payer: Medicare Other | Source: Ambulatory Visit | Attending: Cardiology | Admitting: Cardiology

## 2022-05-16 DIAGNOSIS — R0609 Other forms of dyspnea: Secondary | ICD-10-CM | POA: Diagnosis not present

## 2022-05-16 MED ORDER — REGADENOSON 0.4 MG/5ML IV SOLN
INTRAVENOUS | Status: AC
  Administered 2022-05-16: 0.4 mg via INTRAVENOUS
  Filled 2022-05-16: qty 5

## 2022-05-16 MED ORDER — REGADENOSON 0.4 MG/5ML IV SOLN
0.4000 mg | Freq: Once | INTRAVENOUS | Status: AC
Start: 2022-05-16 — End: 2022-05-16

## 2022-05-16 MED ORDER — RUBIDIUM RB82 GENERATOR (RUBYFILL)
22.7000 | PACK | Freq: Once | INTRAVENOUS | Status: AC
  Administered 2022-05-16: 22.7 via INTRAVENOUS

## 2022-05-16 NOTE — Progress Notes (Signed)
Patient presents for a cardiac stress PET.  He tolerated procedure without any symptoms. However, his BP dropped to the high 80's during the test. Upon finishing the test, patient was sat up and his BP was re-checked and found to be 94/66. Patient denies any dizziness or lightheadedness. Dr. Gardiner Rhyme was called and he was ok with patient going home as long as he didn't have symptoms.  Patient was offered caffeine and ambulated out of the department with a steady gait.

## 2022-05-17 LAB — NM PET CT CARDIAC PERFUSION MULTI W/ABSOLUTE BLOODFLOW
MBFR: 1.18
Nuc Rest EF: 42 %
Nuc Stress EF: 34 %
Rest MBF: 0.55 ml/g/min
Rest Nuclear Isotope Dose: 22.7 mCi
ST Depression (mm): 0 mm
Stress MBF: 0.65 ml/g/min
Stress Nuclear Isotope Dose: 22.7 mCi
TID: 1.18

## 2022-05-18 NOTE — H&P (View-Only) (Signed)
Cardiology Clinic Note   Patient Name: Daniel Yu Date of Encounter: 05/19/2022  Primary Care Provider:  Lavone Orn, MD Primary Cardiologist:  Daniel Ruths, MD  Patient Profile    Daniel Yu is here for follow-up with known history of paroxysmal atrial fibrillation, echocardiogram September 2020 revealed normal LV function mild right ventricular enlargement mild RV dysfunction, biatrial enlargement, and mild to moderate mitral regurgitation.  Underwent successful cardioversion on February 13, 2022, and is on amiodarone.  He was seen in the emergency room on February 17, 2022 with complaints of lightheadedness and dyspnea.    He was found to have decompensated CHF and was given IV Lasix with improvement in symptoms.  Diltiazem and metoprolol were discontinued in the setting of bradycardia with heart rate in the 40s.  He was seen on follow-up by Daniel Yu on 03/20/2022.  He was to continue apixaban CHA2DS2-VASc score of 2 (age hypertension).  Losartan was increased to 100 mg daily.  Past Medical History    Past Medical History:  Diagnosis Date   Arthritis    right knee oa   Asthma    Carpal tunnel syndrome    BILATERAL   Cholecystitis    Complication of anesthesia    adverse reaction to the spinal   Hypertension    PAF (paroxysmal atrial fibrillation) (HCC)    SBO (small bowel obstruction) ([place]) 12/2015   Past Surgical History:  Procedure Laterality Date   CARDIOVERSION N/A 02/14/2017   Procedure: CARDIOVERSION;  Surgeon: Daniel Hector, MD;  Location: [place];  Service: Cardiovascular;  Laterality: N/A;   CARDIOVERSION N/A 12/11/2019   Procedure: CARDIOVERSION;  Surgeon: Daniel Perla, MD;  Location: [place];  Service: Cardiovascular;  Laterality: N/A;   CARDIOVERSION N/A 02/13/2022   Procedure: CARDIOVERSION;  Surgeon: Daniel Perla, MD;  Location[facility] ENDOSCOPY;  Service: Cardiovascular;  Laterality: N/A;   CHOLECYSTECTOMY  02/09/2017   CHOLECYSTECTOMY N/A  02/09/2017   Procedure: LAPAROSCOPIC CHOLECYSTECTOMY WITH INTRAOPERATIVE CHOLANGIOGRAM;  Surgeon: Daniel Mesa, MD;  Location: [place];  Service: General;  Laterality: N/A;   COLONOSCOPY WITH PROPOFOL N/A 11/09/2014   Procedure: COLONOSCOPY WITH PROPOFOL;  Surgeon: Daniel Fair, MD;  Location: [facility] ENDOSCOPY;  Service: Endoscopy;  Laterality: N/A;   HERNIA REPAIR     2013   QUADRICEPS TENDON REPAIR Left 06/07/2015   Procedure: REPAIR LEFT QUADRICEP TENDON;  Surgeon: Daniel Arabian, MD;  Location: [facility];  Service: Orthopedics;  Laterality: Left;   TEE WITHOUT CARDIOVERSION N/A 02/14/2017   Procedure: TRANSESOPHAGEAL ECHOCARDIOGRAM (TEE);  Surgeon: Daniel Hector, MD;  Location: [facility] ENDOSCOPY;  Service: Cardiovascular;  Laterality: N/A;   TOTAL KNEE ARTHROPLASTY Right 12/27/2015   Procedure: RIGHT TOTAL KNEE ARTHROPLASTY;  Surgeon: Daniel Arabian, MD;  Location: [facility];  Service: Orthopedics;  Laterality: Right;    Allergies  Allergies  Allergen Reactions   Zestril [Lisinopril] Other (See Comments)    Unknown reaction type   Bee Venom Swelling and Rash    History of Present Illness    Daniel Yu returns to the office today for ongoing assessment and management of paroxysmal atrial fibrillation, status post ablation in June 2023, and hypertension.  On last office visit losartan was increased to 100 mg daily.  He was to take his blood pressure at home and bring recordings to our office on follow-up.  Patient did have a nuclear medicine PET CT scan on 05/16/2022.  The results of the PET cardiac perfusion scan revealed findings consistent with ischemia  and found to be high risk study.  The findings were concerning for balanced ischemia given 3 times daily present LV EF decreased during stress from 42% to 34% and there was severe coronary artery calcifications.    Coronary calcium was present on attenuation correction CT images revealing severe coronary calcifications being present coronary  calcifications were present in the left anterior descending artery left circumflex artery and right coronary artery distribution.  We will need to discuss cardiac catheterization today with patient.  Daniel Yu comes today with his wife Daniel Yu, to discuss his cardiac PET scan results.  They are both members of [employer] where they know Dr. [place].  The patient works out every day riding a bicycle in the gym at [employer].  His main complaint is dyspnea which is new for him.  He denies any chest pain, chest pressure, or palpitations.  Home Medications    Current Outpatient Medications  Medication Sig Dispense Refill   amiodarone (PACERONE) 200 MG tablet Take 1 tablet (200 mg total) by mouth daily. 90 tablet 3   amLODipine (NORVASC) 10 MG tablet Take 1 tablet (10 mg total) by mouth daily. 90 tablet 3   Ascorbic Acid (VITAMIN C) 1000 MG tablet Take 1,000 mg by mouth in the morning.     cetirizine (ZYRTEC) 10 MG tablet Take 10 mg by mouth in the morning.     COVID-19 mRNA bivalent vaccine, Pfizer, (PFIZER COVID-19 VAC BIVALENT) injection Inject into the muscle. 0.3 mL 0   COVID-19 mRNA Vac-TriS, Pfizer, (PFIZER-BIONT COVID-19 VAC-TRIS) SUSP injection Inject into the muscle. 0.3 mL 0   ELIQUIS 5 MG TABS tablet TAKE 1 TABLET(5 MG) BY MOUTH TWICE DAILY 180 tablet 1   fluticasone (FLOVENT HFA) 110 MCG/ACT inhaler Inhale 1 puff into the lungs 2 (two) times daily.      furosemide (LASIX) 20 MG tablet Take 1 tablet (20 mg total) by mouth daily. 90 tablet 3   hydrALAZINE (APRESOLINE) 25 MG tablet Take 1 tablet (25 mg total) by mouth 2 (two) times daily. 60 tablet 4   influenza vaccine adjuvanted (FLUAD) 0.5 ML injection Inject into the muscle. 0.5 mL 0   losartan (COZAAR) 100 MG tablet Take 1 tablet (100 mg total) by mouth daily. 90 tablet 3   neomycin-bacitracin-polymyxin (NEOSPORIN) [phone] ointment Apply 1 Application topically 2 (two) times daily as needed (wound care).      No current facility-administered medications for this visit.     Family History    Family History  Problem Relation Age of Onset   Hypertension Father    He indicated that his mother is deceased. He indicated that his father is deceased. He indicated that his maternal grandmother is deceased. He indicated that his maternal grandfather is deceased. He indicated that his paternal grandmother is deceased. He indicated that his paternal grandfather is deceased.  Social History    Social History   Socioeconomic History   Marital status: Married    Spouse name: Not on file   Number of children: Not on file   Years of education: Not on file   Highest education level: Not on file  Occupational History   Not on file  Tobacco Use   Smoking status: Never   Smokeless tobacco: Never  Vaping Use   Vaping Use: Never used  Substance and Sexual Activity   Alcohol use: Yes    Comment:  2 beers daily   Drug use: No   Sexual activity: Not on  file  Other Topics Concern   Not on file  Social History Narrative   Not on file   Social Determinants of Health   Financial Resource Strain: Not on file  Food Insecurity: Not on file  Transportation Needs: Not on file  Physical Activity: Not on file  Stress: Not on file  Social Connections: Not on file  Intimate Partner Violence: Not on file     Review of Systems    General:  No chills, fever, night sweats or weight changes.  Cardiovascular:  No chest pain, positive for dyspnea on exertion, edema, orthopnea, palpitations, paroxysmal nocturnal dyspnea. Dermatological: No rash, lesions/masses Respiratory: No cough, dyspnea Urologic: No hematuria, dysuria Abdominal:   No nausea, vomiting, diarrhea, bright red blood per rectum, melena, or hematemesis Neurologic:  No visual changes, wkns, changes in mental status. All other systems reviewed and are otherwise negative except as noted above.     Physical Exam    VS:  BP 130/72   Pulse  63   Ht '5\' 11"'$  (1.803 m)   Wt 197 lb 3.2 oz (89.4 kg)   SpO2 100%   BMI 27.50 kg/m  , BMI Body mass index is 27.5 kg/m.     GEN: Well nourished, well developed, in no acute distress. HEENT: normal. Neck: Supple, no JVD, carotid bruits, or masses. Cardiac: RRR, 2/6 systolic murmurs, heard best at the apex, no rubs, or gallops. No clubbing, cyanosis, edema.  Radials/DP/PT 2+ and equal bilaterally.  Respiratory:  Respirations regular and unlabored, clear to auscultation bilaterally. GI: Soft, nontender, nondistended, BS + x 4. MS: no deformity or atrophy. Skin: warm and dry, no rash. Neuro:  Strength and sensation are intact. Psych: Normal affect.  Accessory Clinical Findings    ECG personally reviewed by me today-normal sinus rhythm, heart rate of 63 bpm- No acute changes  Lab Results  Component Value Date   WBC 7.8 02/17/2022   HGB 14.5 02/17/2022   HCT 42.0 02/17/2022   MCV 101.7 (H) 02/17/2022   PLT 248 02/17/2022   Lab Results  Component Value Date   CREATININE 1.26 02/23/2022   BUN 26 02/23/2022   NA 135 02/23/2022   K 4.8 02/23/2022   CL 100 02/23/2022   CO2 19 (L) 02/23/2022   Lab Results  Component Value Date   ALT 35 02/11/2017   AST 24 02/11/2017   ALKPHOS 63 02/11/2017   BILITOT 0.5 02/11/2017   No results found for: "CHOL", "HDL", "LDLCALC", "LDLDIRECT", "TRIG", "CHOLHDL"  Lab Results  Component Value Date   HGBA1C 5.4 01/03/2016    Review of Prior Studies: NM PET CT, Cardiac Perfusion. 02/13/2022.  Findings are consistent with ischemia. The study is high risk.  Perfusion images are normal.  However,findings are concerning for balanced ischemia given TID is present, LVEF decreases during stress (from 42% to 34%), and there are severe coronary calcifications.  In addition, flows are markedly abnormal, with almost no increase in flows from rest to stress globally and in each coronary distribution.  While this could represent nonresponse to vasodilator,  given TID and drop in EF with stress this is concerning for severe multivessel disease.  Recommend cardiac catheterization.   LV perfusion is normal.   Rest left ventricular function is abnormal. Rest global function is mildly reduced. Rest EF: 42 %. Stress left ventricular function is abnormal. Stress global function is moderately reduced. Stress EF: 34 %.   Coronary calcium was present on the attenuation correction CT images. Severe coronary  calcifications were present. Coronary calcifications were present in the left anterior descending artery, left circumflex artery and right coronary artery distribution(s).   Electronically signed by Oswaldo Milian, MD  Echocardiogram 04/21/2021 1. Left ventricular ejection fraction, by estimation, is 60 to 65%. The  left ventricle has normal function. The left ventricle has no regional  wall motion abnormalities. Left ventricular diastolic function could not  be evaluated.   2. Right ventricular systolic function is mildly reduced. The right  ventricular size is mildly enlarged. There is mildly elevated pulmonary  artery systolic pressure.   3. Left atrial size was mild to moderately dilated.   4. Right atrial size was mildly dilated.   5. The mitral valve is normal in structure. Mild to moderate mitral valve  regurgitation. No evidence of mitral stenosis.   6. The aortic valve is calcified. Aortic valve regurgitation is not  visualized. Mild aortic valve sclerosis is present, with no evidence of  aortic valve stenosis.   7. The inferior vena cava is normal in size with greater than 50%  respiratory variability, suggesting right atrial pressure of 3 mmHg.   Assessment & Plan   1.  Abnormal cardiac PET scan: This revealed severe coronary calcifications present in the left anterior descending artery left circumflex artery right coronary artery distribution.  Daniel Yu asking to be seen today to be scheduled for cardiac catheterization.  I have  discussed the results of the PET scan, answered multiple questions, given explanation of cardiac catheterization, risks and benefits.  The patient understands that risks include but are not limited to stroke (1 in 1000), death (1 in 26), kidney failure [usually temporary] (1 in 500), bleeding (1 in 200), allergic reaction [possibly serious] (1 in 200), and agrees to proceed.    The patient is willing to proceed.  He is requesting Dr. Peter Daniel Yu, and he has been scheduled on Tuesday, October third, 2023, at [place] labs are ordered and drawn today.  He is asked to stop taking Eliquis on Sunday, May 21, 2022.  He verbalizes understanding.  Will likely be placed on high intensity statin due to significant coronary artery calcifications.  I do not have recent labs to review.  2.  Paroxysmal atrial fibrillation: He is currently in normal sinus rhythm per EKG done today.  Remains on amiodarone 200 mg daily, Eliquis 5 mg twice daily (which was being held prior to catheterization for 48 hours).  The patient is asymptomatic and offers no complaints of recurrent palpitations or elevated heart rate.  3.  Hypertension: Blood pressures currently controlled.  He will remain on losartan 100 mg daily and hydralazine 25 mg twice daily.     Current medicines are reviewed at length with the patient today.  I have spent 45 min's  dedicated to the care of this patient on the date of this encounter to include pre-visit review of records, assessment, management and diagnostic testing,with shared decision making.  Signed, Phill Myron. West Pugh, ANP, [place]   05/19/2022 4:27 PM      Office [phone] Fax [phone]  Notice: This dictation was prepared with Dragon dictation along with smaller phrase technology. Any transcriptional errors that result from this process are unintentional and may not be corrected upon review.

## 2022-05-18 NOTE — Progress Notes (Signed)
Cardiology Clinic Note   Patient Name: Daniel Yu Date of Encounter: 05/19/2022  Primary Care Provider:  Lavone Orn, MD Primary Cardiologist:  Kirk Ruths, MD  Patient Profile    Daniel Yu is here for follow-up with known history of paroxysmal atrial fibrillation, echocardiogram September 2020 revealed normal LV function mild right ventricular enlargement mild RV dysfunction, biatrial enlargement, and mild to moderate mitral regurgitation.  Underwent successful cardioversion on February 13, 2022, and is on amiodarone.  He was seen in the emergency room on February 17, 2022 with complaints of lightheadedness and dyspnea.    He was found to have decompensated CHF and was given IV Lasix with improvement in symptoms.  Diltiazem and metoprolol were discontinued in the setting of bradycardia with heart rate in the 40s.  He was seen on follow-up by Dr. Stanford Breed on 03/20/2022.  He was to continue apixaban CHA2DS2-VASc score of 2 (age hypertension).  Losartan was increased to 100 mg daily.  Past Medical History    Past Medical History:  Diagnosis Date   Arthritis    right knee oa   Asthma    Carpal tunnel syndrome    BILATERAL   Cholecystitis    Complication of anesthesia    adverse reaction to the spinal   Hypertension    PAF (paroxysmal atrial fibrillation) (HCC)    SBO (small bowel obstruction) ([place]) 12/2015   Past Surgical History:  Procedure Laterality Date   CARDIOVERSION N/A 02/14/2017   Procedure: CARDIOVERSION;  Surgeon: Josue Hector, MD;  Location: [place];  Service: Cardiovascular;  Laterality: N/A;   CARDIOVERSION N/A 12/11/2019   Procedure: CARDIOVERSION;  Surgeon: Lelon Perla, MD;  Location: [place];  Service: Cardiovascular;  Laterality: N/A;   CARDIOVERSION N/A 02/13/2022   Procedure: CARDIOVERSION;  Surgeon: Lelon Perla, MD;  Location: [facility] ENDOSCOPY;  Service: Cardiovascular;  Laterality: N/A;   CHOLECYSTECTOMY  02/09/2017   CHOLECYSTECTOMY N/A  02/09/2017   Procedure: LAPAROSCOPIC CHOLECYSTECTOMY WITH INTRAOPERATIVE CHOLANGIOGRAM;  Surgeon: Donnie Mesa, MD;  Location: [place];  Service: General;  Laterality: N/A;   COLONOSCOPY WITH PROPOFOL N/A 11/09/2014   Procedure: COLONOSCOPY WITH PROPOFOL;  Surgeon: Garlan Fair, MD;  Location: [facility];  Service: Endoscopy;  Laterality: N/A;   HERNIA REPAIR     2013   QUADRICEPS TENDON REPAIR Left 06/07/2015   Procedure: REPAIR LEFT QUADRICEP TENDON;  Surgeon: Gaynelle Arabian, MD;  Location: [facility];  Service: Orthopedics;  Laterality: Left;   TEE WITHOUT CARDIOVERSION N/A 02/14/2017   Procedure: TRANSESOPHAGEAL ECHOCARDIOGRAM (TEE);  Surgeon: Josue Hector, MD;  Location: [facility] ENDOSCOPY;  Service: Cardiovascular;  Laterality: N/A;   TOTAL KNEE ARTHROPLASTY Right 12/27/2015   Procedure: RIGHT TOTAL KNEE ARTHROPLASTY;  Surgeon: Gaynelle Arabian, MD;  Location: [facility];  Service: Orthopedics;  Laterality: Right;    Allergies  Allergies  Allergen Reactions   Zestril [Lisinopril] Other (See Comments)    Unknown reaction type   Bee Venom Swelling and Rash    History of Present Illness    Daniel Yu returns to the office today for ongoing assessment and management of paroxysmal atrial fibrillation, status post ablation in June 2023, and hypertension.  On last office visit losartan was increased to 100 mg daily.  He was to take his blood pressure at home and bring recordings to our office on follow-up.  Patient did have a nuclear medicine PET CT scan on 05/16/2022.  The results of the PET cardiac perfusion scan revealed findings consistent with ischemia  and found to be high risk study.  The findings were concerning for balanced ischemia given 3 times daily present LV EF decreased during stress from 42% to 34% and there was severe coronary artery calcifications.    Coronary calcium was present on attenuation correction CT images revealing severe coronary calcifications being present coronary  calcifications were present in the left anterior descending artery left circumflex artery and right coronary artery distribution.  We will need to discuss cardiac catheterization today with patient.  Daniel Yu comes today with his wife [name], to discuss his cardiac PET scan results.  They are both members of [employer] where they know Dr. Martinique.  The patient works out every day riding a bicycle in the gym at [employer].  His main complaint is dyspnea which is new for him.  He denies any chest pain, chest pressure, or palpitations.  Home Medications    Current Outpatient Medications  Medication Sig Dispense Refill   amiodarone (PACERONE) 200 MG tablet Take 1 tablet (200 mg total) by mouth daily. 90 tablet 3   amLODipine (NORVASC) 10 MG tablet Take 1 tablet (10 mg total) by mouth daily. 90 tablet 3   Ascorbic Acid (VITAMIN C) 1000 MG tablet Take 1,000 mg by mouth in the morning.     cetirizine (ZYRTEC) 10 MG tablet Take 10 mg by mouth in the morning.     COVID-19 mRNA bivalent vaccine, Pfizer, (PFIZER COVID-19 VAC BIVALENT) injection Inject into the muscle. 0.3 mL 0   COVID-19 mRNA Vac-TriS, Pfizer, (PFIZER-BIONT COVID-19 VAC-TRIS) SUSP injection Inject into the muscle. 0.3 mL 0   ELIQUIS 5 MG TABS tablet TAKE 1 TABLET(5 MG) BY MOUTH TWICE DAILY 180 tablet 1   fluticasone (FLOVENT HFA) 110 MCG/ACT inhaler Inhale 1 puff into the lungs 2 (two) times daily.      furosemide (LASIX) 20 MG tablet Take 1 tablet (20 mg total) by mouth daily. 90 tablet 3   hydrALAZINE (APRESOLINE) 25 MG tablet Take 1 tablet (25 mg total) by mouth 2 (two) times daily. 60 tablet 4   influenza vaccine adjuvanted (FLUAD) 0.5 ML injection Inject into the muscle. 0.5 mL 0   losartan (COZAAR) 100 MG tablet Take 1 tablet (100 mg total) by mouth daily. 90 tablet 3   neomycin-bacitracin-polymyxin (NEOSPORIN) [phone] ointment Apply 1 Application topically 2 (two) times daily as needed (wound care).      No current facility-administered medications for this visit.     Family History    Family History  Problem Relation Age of Onset   Hypertension Father    He indicated that his mother is deceased. He indicated that his father is deceased. He indicated that his maternal grandmother is deceased. He indicated that his maternal grandfather is deceased. He indicated that his paternal grandmother is deceased. He indicated that his paternal grandfather is deceased.  Social History    Social History   Socioeconomic History   Marital status: Married    Spouse name: Not on file   Number of children: Not on file   Years of education: Not on file   Highest education level: Not on file  Occupational History   Not on file  Tobacco Use   Smoking status: Never   Smokeless tobacco: Never  Vaping Use   Vaping Use: Never used  Substance and Sexual Activity   Alcohol use: Yes    Comment:  2 beers daily   Drug use: No   Sexual activity: Not on  file  Other Topics Concern   Not on file  Social History Narrative   Not on file   Social Determinants of Health   Financial Resource Strain: Not on file  Food Insecurity: Not on file  Transportation Needs: Not on file  Physical Activity: Not on file  Stress: Not on file  Social Connections: Not on file  Intimate Partner Violence: Not on file     Review of Systems    General:  No chills, fever, night sweats or weight changes.  Cardiovascular:  No chest pain, positive for dyspnea on exertion, edema, orthopnea, palpitations, paroxysmal nocturnal dyspnea. Dermatological: No rash, lesions/masses Respiratory: No cough, dyspnea Urologic: No hematuria, dysuria Abdominal:   No nausea, vomiting, diarrhea, bright red blood per rectum, melena, or hematemesis Neurologic:  No visual changes, wkns, changes in mental status. All other systems reviewed and are otherwise negative except as noted above.     Physical Exam    VS:  BP 130/72   Pulse  63   Ht '5\' 11"'$  (1.803 m)   Wt 197 lb 3.2 oz (89.4 kg)   SpO2 100%   BMI 27.50 kg/m  , BMI Body mass index is 27.5 kg/m.     GEN: Well nourished, well developed, in no acute distress. HEENT: normal. Neck: Supple, no JVD, carotid bruits, or masses. Cardiac: RRR, 2/6 systolic murmurs, heard best at the apex, no rubs, or gallops. No clubbing, cyanosis, edema.  Radials/DP/PT 2+ and equal bilaterally.  Respiratory:  Respirations regular and unlabored, clear to auscultation bilaterally. GI: Soft, nontender, nondistended, BS + x 4. MS: no deformity or atrophy. Skin: warm and dry, no rash. Neuro:  Strength and sensation are intact. Psych: Normal affect.  Accessory Clinical Findings    ECG personally reviewed by me today-normal sinus rhythm, heart rate of 63 bpm- No acute changes  Lab Results  Component Value Date   WBC 7.8 02/17/2022   HGB 14.5 02/17/2022   HCT 42.0 02/17/2022   MCV 101.7 (H) 02/17/2022   PLT 248 02/17/2022   Lab Results  Component Value Date   CREATININE 1.26 02/23/2022   BUN 26 02/23/2022   NA 135 02/23/2022   K 4.8 02/23/2022   CL 100 02/23/2022   CO2 19 (L) 02/23/2022   Lab Results  Component Value Date   ALT 35 02/11/2017   AST 24 02/11/2017   ALKPHOS 63 02/11/2017   BILITOT 0.5 02/11/2017   No results found for: "CHOL", "HDL", "LDLCALC", "LDLDIRECT", "TRIG", "CHOLHDL"  Lab Results  Component Value Date   HGBA1C 5.4 01/03/2016    Review of Prior Studies: NM PET CT, Cardiac Perfusion. 02/13/2022.  Findings are consistent with ischemia. The study is high risk.  Perfusion images are normal.  However,findings are concerning for balanced ischemia given TID is present, LVEF decreases during stress (from 42% to 34%), and there are severe coronary calcifications.  In addition, flows are markedly abnormal, with almost no increase in flows from rest to stress globally and in each coronary distribution.  While this could represent nonresponse to vasodilator,  given TID and drop in EF with stress this is concerning for severe multivessel disease.  Recommend cardiac catheterization.   LV perfusion is normal.   Rest left ventricular function is abnormal. Rest global function is mildly reduced. Rest EF: 42 %. Stress left ventricular function is abnormal. Stress global function is moderately reduced. Stress EF: 34 %.   Coronary calcium was present on the attenuation correction CT images. Severe coronary  calcifications were present. Coronary calcifications were present in the left anterior descending artery, left circumflex artery and right coronary artery distribution(s).   Electronically signed by Oswaldo Milian, MD  Echocardiogram 04/21/2021 1. Left ventricular ejection fraction, by estimation, is 60 to 65%. The  left ventricle has normal function. The left ventricle has no regional  wall motion abnormalities. Left ventricular diastolic function could not  be evaluated.   2. Right ventricular systolic function is mildly reduced. The right  ventricular size is mildly enlarged. There is mildly elevated pulmonary  artery systolic pressure.   3. Left atrial size was mild to moderately dilated.   4. Right atrial size was mildly dilated.   5. The mitral valve is normal in structure. Mild to moderate mitral valve  regurgitation. No evidence of mitral stenosis.   6. The aortic valve is calcified. Aortic valve regurgitation is not  visualized. Mild aortic valve sclerosis is present, with no evidence of  aortic valve stenosis.   7. The inferior vena cava is normal in size with greater than 50%  respiratory variability, suggesting right atrial pressure of 3 mmHg.   Assessment & Plan   1.  Abnormal cardiac PET scan: This revealed severe coronary calcifications present in the left anterior descending artery left circumflex artery right coronary artery distribution.  Dr. Stanford Breed asking to be seen today to be scheduled for cardiac catheterization.  I have  discussed the results of the PET scan, answered multiple questions, given explanation of cardiac catheterization, risks and benefits.  The patient understands that risks include but are not limited to stroke (1 in 1000), death (1 in 46), kidney failure [usually temporary] (1 in 500), bleeding (1 in 200), allergic reaction [possibly serious] (1 in 200), and agrees to proceed.    The patient is willing to proceed.  He is requesting Dr. Peter Martinique, and he has been scheduled on Tuesday, October third, 2023, at [place] labs are ordered and drawn today.  He is asked to stop taking Eliquis on Sunday, May 21, 2022.  He verbalizes understanding.  Will likely be placed on high intensity statin due to significant coronary artery calcifications.  I do not have recent labs to review.  2.  Paroxysmal atrial fibrillation: He is currently in normal sinus rhythm per EKG done today.  Remains on amiodarone 200 mg daily, Eliquis 5 mg twice daily (which was being held prior to catheterization for 48 hours).  The patient is asymptomatic and offers no complaints of recurrent palpitations or elevated heart rate.  3.  Hypertension: Blood pressures currently controlled.  He will remain on losartan 100 mg daily and hydralazine 25 mg twice daily.     Current medicines are reviewed at length with the patient today.  I have spent 45 min's  dedicated to the care of this patient on the date of this encounter to include pre-visit review of records, assessment, management and diagnostic testing,with shared decision making.  Signed, Phill Myron. [place] Pugh, ANP, [place]   05/19/2022 4:27 PM      Office [phone] Fax [phone]  Notice: This dictation was prepared with Dragon dictation along with smaller phrase technology. Any transcriptional errors that result from this process are unintentional and may not be corrected upon review.

## 2022-05-19 ENCOUNTER — Encounter: Payer: Self-pay | Admitting: Adult Health

## 2022-05-19 ENCOUNTER — Ambulatory Visit: Payer: Medicare Other | Attending: Cardiovascular Disease | Admitting: Adult Health

## 2022-05-19 ENCOUNTER — Other Ambulatory Visit: Payer: Self-pay | Admitting: Adult Health

## 2022-05-19 VITALS — BP 130/72 | HR 63 | Ht 71.0 in | Wt 197.2 lb

## 2022-05-19 DIAGNOSIS — I2510 Atherosclerotic heart disease of native coronary artery without angina pectoris: Secondary | ICD-10-CM

## 2022-05-19 DIAGNOSIS — I10 Essential (primary) hypertension: Secondary | ICD-10-CM

## 2022-05-19 DIAGNOSIS — I480 Paroxysmal atrial fibrillation: Secondary | ICD-10-CM | POA: Diagnosis not present

## 2022-05-19 DIAGNOSIS — R0609 Other forms of dyspnea: Secondary | ICD-10-CM | POA: Diagnosis not present

## 2022-05-19 DIAGNOSIS — Z01818 Encounter for other preprocedural examination: Secondary | ICD-10-CM | POA: Diagnosis not present

## 2022-05-19 NOTE — Patient Instructions (Signed)
Medication Instructions:  Your physician recommends that you continue on your current medications as directed. Please refer to the Current Medication list given to you today.  *If you need a refill on your cardiac medications before your next appointment, please call your pharmacy*   Lab Work: Your physician recommends that you have the following labs drawn: CBC, and BMET If you have labs (blood work) drawn today and your tests are completely normal, you will receive your results only by: [place] (if you have MyChart) OR A paper copy in the mail If you have any lab test that is abnormal or we need to change your treatment, we will call you to review the results.   Testing/Procedures:  RODGERS LIKES  05/19/2022  You are scheduled for a Cardiac Catheterization on Tuesday, October 3 with Dr. Peter Martinique.  1. Please arrive at the [facility] (Main Entrance A) at [facility]: [address] [place], [place] [address] at 7:00 AM (This time is two hours before your procedure to ensure your preparation). Free valet parking service is available.   Special note: Every effort is made to have your procedure done on time. Please understand that emergencies sometimes delay scheduled procedures.  2. Diet: Do not eat solid foods after midnight.  The patient may have clear liquids until 5am upon the day of the procedure.  3. Labs will be drawn today in office.  4. Medication instructions in preparation for your procedure:   Contrast Allergy: No  Stop taking Eliquis (Apixiban) on Sunday, October 1.  On the morning of your procedure, take your Aspirin 81 mg and any morning medicines NOT listed above.  You may use sips of water.  5. Plan for one night stay--bring personal belongings. 6. Bring a current list of your medications and current insurance cards. 7. You MUST have a responsible person to drive you home. 8. Someone MUST be with you the first 24 hours after you arrive home or  your discharge will be delayed. 9. Please wear clothes that are easy to get on and off and wear slip-on shoes.  Thank you for allowing [place] to care for you!   -- [place] [place] [facility]    Follow-Up: At [facility], you and your health needs are our priority.  As part of our continuing mission to provide you with exceptional heart care, we have created designated Provider Care Teams.  These Care Teams include your primary Cardiologist (physician) and Advanced Practice Providers (APPs -  Physician Assistants and Nurse Practitioners) who all work together to provide you with the care you need, when you need it.  We recommend signing up for the patient portal called "MyChart".  Sign up information is provided on this After Visit Summary.  MyChart is used to connect with patients for Virtual Visits (Telemedicine).  Patients are able to view lab/test results, encounter notes, upcoming appointments, etc.  Non-urgent messages can be sent to your provider as well.   To learn more about what you can do with MyChart, go to [email or web address].    Your next appointment:   2 week(s)  The format for your next appointment:   In Person  Provider:   Jory Sims, DNP, ANP OR Kirk Ruths, MD

## 2022-05-20 LAB — BASIC METABOLIC PANEL
BUN/Creatinine Ratio: 17 (ref 10–24)
BUN: 16 mg/dL (ref 8–27)
CO2: 23 mmol/L (ref 20–29)
Calcium: 9.2 mg/dL (ref 8.6–10.2)
Chloride: 96 mmol/L (ref 96–106)
Creatinine, Ser: 0.93 mg/dL (ref 0.76–1.27)
Glucose: 91 mg/dL (ref 70–99)
Potassium: 5.4 mmol/L — ABNORMAL HIGH (ref 3.5–5.2)
Sodium: 135 mmol/L (ref 134–144)
eGFR: 79 mL/min/{1.73_m2} (ref >59)

## 2022-05-20 LAB — CBC
Hematocrit: 41.3 % (ref 37.5–51.0)
Hemoglobin: 14.4 g/dL (ref 13.0–17.7)
MCH: 34.5 pg — ABNORMAL HIGH (ref 26.6–33.0)
MCHC: 34.9 g/dL (ref 31.5–35.7)
MCV: 99 fL — ABNORMAL HIGH (ref 79–97)
Platelets: 277 10*3/uL (ref 150–450)
RBC: 4.17 x10E6/uL (ref 4.14–5.80)
RDW: 11.9 % (ref 11.6–15.4)
WBC: 5.9 10*3/uL (ref 3.4–10.8)

## 2022-05-22 ENCOUNTER — Telehealth: Payer: Self-pay | Admitting: *Deleted

## 2022-05-22 NOTE — Telephone Encounter (Signed)
Cardiac Catheterization scheduled at [facility] for: Tuesday May 23, 2022 9 AM Arrival time and place: [place] Entrance A at: 7 AM  Nothing to eat after midnight prior to procedure, clear liquids until 5 AM day of procedure.  Medication instructions: -Hold:  Eliquis-none 05/21/22 until post procedure  Lasix-AM of procedure -Except hold medications usual morning medications can be taken with sips of water including aspirin 81 mg.  Confirmed patient has responsible adult to drive home post procedure and be with patient first 24 hours after arriving home.  Patient reports no new symptoms concerning for COVID-19 in the past 10 days.  Reviewed procedure instructions with patient's wife (DPR).

## 2022-05-23 ENCOUNTER — Ambulatory Visit (HOSPITAL_COMMUNITY)
Admission: RE | Admit: 2022-05-23 | Discharge: 2022-05-23 | Disposition: A | Discharge status: Home / Self Care | Payer: Medicare Other | Attending: Cardiology | Admitting: Cardiology

## 2022-05-23 ENCOUNTER — Encounter (HOSPITAL_COMMUNITY)
Admission: RE | Disposition: A | Discharge status: Home / Self Care | Payer: Self-pay | Source: Home / Self Care | Attending: Cardiology

## 2022-05-23 ENCOUNTER — Other Ambulatory Visit: Payer: Self-pay

## 2022-05-23 ENCOUNTER — Encounter (HOSPITAL_COMMUNITY): Payer: Self-pay | Admitting: Cardiology

## 2022-05-23 DIAGNOSIS — Z79899 Other long term (current) drug therapy: Secondary | ICD-10-CM | POA: Diagnosis not present

## 2022-05-23 DIAGNOSIS — Z7901 Long term (current) use of anticoagulants: Secondary | ICD-10-CM | POA: Diagnosis not present

## 2022-05-23 DIAGNOSIS — R9439 Abnormal result of other cardiovascular function study: Secondary | ICD-10-CM | POA: Diagnosis not present

## 2022-05-23 DIAGNOSIS — I2510 Atherosclerotic heart disease of native coronary artery without angina pectoris: Secondary | ICD-10-CM | POA: Insufficient documentation

## 2022-05-23 DIAGNOSIS — I10 Essential (primary) hypertension: Secondary | ICD-10-CM | POA: Diagnosis not present

## 2022-05-23 DIAGNOSIS — I2584 Coronary atherosclerosis due to calcified coronary lesion: Secondary | ICD-10-CM | POA: Insufficient documentation

## 2022-05-23 DIAGNOSIS — I480 Paroxysmal atrial fibrillation: Secondary | ICD-10-CM | POA: Diagnosis not present

## 2022-05-23 HISTORY — PX: LEFT HEART CATH AND CORONARY ANGIOGRAPHY: CATH118249

## 2022-05-23 SURGERY — LEFT HEART CATH AND CORONARY ANGIOGRAPHY
Anesthesia: LOCAL

## 2022-05-23 MED ORDER — ASPIRIN 81 MG PO CHEW: 81.0000 mg | CHEWABLE_TABLET | ORAL | Status: DC

## 2022-05-23 MED ORDER — ACETAMINOPHEN 325 MG PO TABS: 650.0000 mg | ORAL_TABLET | ORAL | Status: DC | PRN

## 2022-05-23 MED ORDER — SODIUM CHLORIDE 0.9 % IV SOLN: 250.0000 mL | INTRAVENOUS | Status: DC | PRN

## 2022-05-23 MED ORDER — LABETALOL HCL 5 MG/ML IV SOLN: 10.0000 mg | INTRAVENOUS | Status: DC | PRN

## 2022-05-23 MED ORDER — HEPARIN SODIUM (PORCINE) 1000 UNIT/ML IJ SOLN
INTRAMUSCULAR | Status: DC | PRN
  Administered 2022-05-23: 4500 [IU] via INTRAVENOUS

## 2022-05-23 MED ORDER — IOHEXOL 350 MG/ML SOLN
INTRAVENOUS | Status: DC | PRN
  Administered 2022-05-23: 80 mL

## 2022-05-23 MED ORDER — FENTANYL CITRATE (PF) 100 MCG/2ML IJ SOLN
INTRAMUSCULAR | Status: AC
  Filled 2022-05-23: qty 2

## 2022-05-23 MED ORDER — MIDAZOLAM HCL 2 MG/2ML IJ SOLN
INTRAMUSCULAR | Status: AC
  Filled 2022-05-23: qty 2

## 2022-05-23 MED ORDER — SODIUM CHLORIDE 0.9 % WEIGHT BASED INFUSION: 1.0000 mL/kg/h | INTRAVENOUS | Status: DC

## 2022-05-23 MED ORDER — MIDAZOLAM HCL 2 MG/2ML IJ SOLN
INTRAMUSCULAR | Status: DC | PRN
  Administered 2022-05-23: 1 mg via INTRAVENOUS

## 2022-05-23 MED ORDER — FENTANYL CITRATE (PF) 100 MCG/2ML IJ SOLN
INTRAMUSCULAR | Status: DC | PRN
  Administered 2022-05-23: 25 ug via INTRAVENOUS

## 2022-05-23 MED ORDER — LIDOCAINE HCL (PF) 1 % IJ SOLN
INTRAMUSCULAR | Status: DC | PRN
  Administered 2022-05-23: 2 mL via INTRADERMAL

## 2022-05-23 MED ORDER — ONDANSETRON HCL 4 MG/2ML IJ SOLN: 4.0000 mg | Freq: Four times a day (QID) | INTRAMUSCULAR | Status: DC | PRN

## 2022-05-23 MED ORDER — LIDOCAINE HCL (PF) 1 % IJ SOLN
INTRAMUSCULAR | Status: AC
  Filled 2022-05-23: qty 30

## 2022-05-23 MED ORDER — HEPARIN (PORCINE) IN NACL 1000-0.9 UT/500ML-% IV SOLN
INTRAVENOUS | Status: AC
  Filled 2022-05-23: qty 1000

## 2022-05-23 MED ORDER — HYDRALAZINE HCL 20 MG/ML IJ SOLN: 10.0000 mg | INTRAMUSCULAR | Status: DC | PRN

## 2022-05-23 MED ORDER — VERAPAMIL HCL 2.5 MG/ML IV SOLN
INTRAVENOUS | Status: DC | PRN
  Administered 2022-05-23: 10 mL via INTRA_ARTERIAL

## 2022-05-23 MED ORDER — SODIUM CHLORIDE 0.9% FLUSH: 3.0000 mL | INTRAVENOUS | Status: DC | PRN

## 2022-05-23 MED ORDER — SODIUM CHLORIDE 0.9 % WEIGHT BASED INFUSION
3.0000 mL/kg/h | INTRAVENOUS | Status: AC
  Administered 2022-05-23: 3 mL/kg/h via INTRAVENOUS

## 2022-05-23 MED ORDER — SODIUM CHLORIDE 0.9% FLUSH: 3.0000 mL | Freq: Two times a day (BID) | INTRAVENOUS | Status: DC

## 2022-05-23 MED ORDER — SODIUM CHLORIDE 0.9 % IV SOLN: INTRAVENOUS | Status: DC

## 2022-05-23 MED ORDER — HEPARIN (PORCINE) IN NACL 1000-0.9 UT/500ML-% IV SOLN
INTRAVENOUS | Status: DC | PRN
  Administered 2022-05-23 (×2): 500 mL

## 2022-05-23 SURGICAL SUPPLY — 10 items
BAND ZEPHYR COMPRESS 30 LONG (HEMOSTASIS) IMPLANT
CATH 5FR JL3.5 JR4 ANG PIG MP (CATHETERS) IMPLANT
CATH INFINITI 5 FR MPA2 (CATHETERS) IMPLANT
GLIDESHEATH SLEND SS 6F .021 (SHEATH) IMPLANT
GUIDEWIRE INQWIRE 1.5J.035X260 (WIRE) IMPLANT
INQWIRE 1.5J .035X260CM (WIRE) ×1
KIT HEART LEFT (KITS) ×1 IMPLANT
PACK CARDIAC CATHETERIZATION (CUSTOM PROCEDURE TRAY) ×1 IMPLANT
TRANSDUCER W/STOPCOCK (MISCELLANEOUS) ×1 IMPLANT
TUBING CIL FLEX 10 FLL-RA (TUBING) ×1 IMPLANT

## 2022-05-23 NOTE — Progress Notes (Signed)
TR BAND REMOVAL  LOCATION:    right radial  DEFLATED PER PROTOCOL:    Yes.    TIME BAND OFF / DRESSING APPLIED:    1200 gauze dressing applied   SITE UPON ARRIVAL:    Level 0  SITE AFTER BAND REMOVAL:    Level 0  CIRCULATION SENSATION AND MOVEMENT:    Within Normal Limits   Yes.    COMMENTS:   no new issues noted

## 2022-05-23 NOTE — Interval H&P Note (Signed)
History and Physical Interval Note:  05/23/2022 8:57 AM  Daniel Yu  has presented today for surgery, with the diagnosis of abnormal pet scan.  The various methods of treatment have been discussed with the patient and family. After consideration of risks, benefits and other options for treatment, the patient has consented to  Procedure(s): LEFT HEART CATH AND CORONARY ANGIOGRAPHY (N/A) as a surgical intervention.  The patient's history has been reviewed, patient examined, no change in status, stable for surgery.  I have reviewed the patient's chart and labs.  Questions were answered to the patient's satisfaction.    Cath Lab Visit (complete for each Cath Lab visit)  Clinical Evaluation Leading to the Procedure:   ACS: No.  Non-ACS:    Anginal Classification: CCS II  Anti-ischemic medical therapy: Minimal Therapy (1 class of medications)  Non-Invasive Test Results: High-risk stress test findings: cardiac mortality >3%/year  Prior CABG: No previous CABG       Daniel Yu [facility] 05/23/2022 8:57 AM

## 2022-05-23 NOTE — Progress Notes (Signed)
While removing 3cc of air from TR band pt began bleeding after 2.5cc removed, 1 cc of air returned to band, bleeding stopped. Will resume air removal at 1045 and will continue to monitor

## 2022-05-23 NOTE — Discharge Instructions (Addendum)
May resume Eliquis this evening  Radial Site Care Drink plenty of fluid for the next 3 days  Keep arm elevated for the next 24 hours The following information offers guidance on how to care for yourself after your procedure. Your health care provider may also give you more specific instructions. If you have problems or questions, contact your health care provider. What can I expect after the procedure? After the procedure, it is common to have bruising and tenderness in the incision area. Follow these instructions at home: Incision site care  Follow instructions from your health care provider about how to take care of your incision site. Make sure you:  Remove your dressing 24 hours after discharge.  Do not take baths, swim, or use a hot tub for 1 week. You may shower 24 hours after the procedure or as told by your health care provider. Remove the dressing and gently wash the incision area with plain soap and water. Pat the area dry with a clean towel. Do not rub the site. That could cause bleeding. Do not apply powder or lotion to the site. Check your incision site every day for signs of infection. Check for: Redness, swelling, or pain. Fluid or blood. Warmth. Pus or a bad smell. Activity For 24 hours after the procedure, or as directed by your health care provider: Do not flex or bend the affected arm. Do not push or pull heavy objects with the affected arm. Do not operate machinery or power tools. Do not drive. You should not drive yourself home from the hospital or clinic if you go home during that time period. You may drive 24 hours after the procedure unless your health care provider tells you not to. Do not lift anything that is heavier than 10 lb (4.5 kg), or the limit that you are told, until your health care provider says that it is safe. Return to your normal activities as told by your health care provider. Ask your health care provider what activities are safe for you and  when you can return to work. If you were given a sedative during the procedure, it can affect you for several hours. Do not drive or operate machinery until your health care provider says that it is safe. General instructions Take over-the-counter and prescription medicines only as told by your health care provider. If you will be going home right after the procedure, plan to have a responsible adult care for you for the time you are told. This is important. Keep all follow-up visits. This is important. Contact a health care provider if: You have a fever or chills. You have any of these signs of infection at your incision site: Redness, swelling, or pain. Fluid or blood. Warmth. Pus or a bad smell. Get help right away if: The incision area swells very fast. The incision area is bleeding, and the bleeding does not stop when you hold steady pressure on the area. Your arm or hand becomes pale, cool, tingly, or numb. These symptoms may represent a serious problem that is an emergency. Do not wait to see if the symptoms will go away. Get medical help right away. Call your local emergency services (911 in the [place] Do not drive yourself to the hospital. Summary After the procedure, it is common to have bruising and tenderness at the incision site. Follow instructions from your health care provider about how to take care of your radial site incision. Check the incision every day for signs of infection.  Do not lift anything that is heavier than 10 lb (4.5 kg), or the limit that you are told, until your health care provider says that it is safe. Get help right away if the incision area swells very fast, you have bleeding at the incision site that will not stop, or your arm or hand becomes pale, cool, or numb. This information is not intended to replace advice given to you by your health care provider. Make sure you discuss any questions you have with your health care provider. Document Revised:  09/26/2020 Document Reviewed: 09/26/2020 Elsevier Patient Education  [place].

## 2022-05-24 ENCOUNTER — Telehealth: Payer: Self-pay | Admitting: Cardiology

## 2022-05-24 NOTE — Telephone Encounter (Signed)
Pt's wife called pt has wt gain of 3 lbs since cath and lower ext edema despite taking the lasix today.  No SOB.  I gave them option of taking another 20 mg now vs waiting until am and if still with swelling to take 2 of the 20 mg.  They were instructed to call if despite taking extra lasix they would call us back.   His cath with non obstructive disease

## 2022-06-02 ENCOUNTER — Other Ambulatory Visit: Payer: Self-pay | Admitting: *Deleted

## 2022-06-02 ENCOUNTER — Telehealth: Payer: Self-pay | Admitting: Cardiology

## 2022-06-02 DIAGNOSIS — R0609 Other forms of dyspnea: Secondary | ICD-10-CM

## 2022-06-02 DIAGNOSIS — I428 Other cardiomyopathies: Secondary | ICD-10-CM

## 2022-06-02 MED ORDER — FUROSEMIDE 20 MG PO TABS: 40.0000 mg | ORAL_TABLET | Freq: Every day | ORAL | 3 refills | Status: DC

## 2022-06-02 NOTE — Telephone Encounter (Signed)
  Pt c/o Shortness Of Breath: STAT if SOB developed within the last 24 hours or pt is noticeably SOB on the phone  1. Are you currently SOB (can you hear that pt is SOB on the phone)? No   2. How long have you been experiencing SOB?   3. Are you SOB when sitting or when up moving around? Mild exertion   4. Are you currently experiencing any other symptoms? Pt's wife said, since pt had his heart cath pt is gets more SOB with mild exertion. She is a little concern and would like to get recommendation from Dr. Stanford Breed

## 2022-06-02 NOTE — Telephone Encounter (Addendum)
Spoke with pt's wife, Daniel Yu (ok per DPR) regarding pt having increased shortness of breath over last couple of weeks. Wife explains that pt has been dealing with shortness of breath over the last year or so, but since pt's heart cath (05/23/22) he is having more difficulty with mild exertion such as watering the lawn. Heart cath showed mild non-obstructive disease. Wife states that pt was placed on new blood pressure medication before leaving the hospital, hydralazine '25mg'$  BID, she reports that his blood pressure does seem to be doing better. Pt also called after hours provider on 10/4 and spoke to Daniel Kicks, NP regarding weight gain and was instructed to take additional dose of lasix. Wife states that pt is weighing daily and did see that this fluid had come off and does not feel like shortness of breath is associated with an increase in fluid. Wife states weights have been stable. Wife did report blood pressure readings taken about 30 minutes after taking medication. 102/60, 119/63, 112/68 consecutively taken. Wife would like for Daniel Yu to advise on next steps.

## 2022-06-02 NOTE — Telephone Encounter (Signed)
Spoke with pt wife, Aware of dr crenshaw's recommendations.  New script sent to the pharmacy  

## 2022-06-04 ENCOUNTER — Emergency Department (HOSPITAL_COMMUNITY): Payer: Medicare Other

## 2022-06-04 ENCOUNTER — Other Ambulatory Visit: Payer: Self-pay

## 2022-06-04 ENCOUNTER — Encounter (HOSPITAL_COMMUNITY): Payer: Self-pay | Admitting: Emergency Medicine

## 2022-06-04 ENCOUNTER — Inpatient Hospital Stay (HOSPITAL_COMMUNITY)
Admission: EM | Admit: 2022-06-04 | Discharge: 2022-06-07 | DRG: 291 | Disposition: A | Discharge status: Home / Self Care | Payer: Medicare Other | Attending: Cardiology | Admitting: Cardiology

## 2022-06-04 DIAGNOSIS — I509 Heart failure, unspecified: Secondary | ICD-10-CM | POA: Diagnosis not present

## 2022-06-04 DIAGNOSIS — I5033 Acute on chronic diastolic (congestive) heart failure: Secondary | ICD-10-CM | POA: Diagnosis present

## 2022-06-04 DIAGNOSIS — I10 Essential (primary) hypertension: Secondary | ICD-10-CM

## 2022-06-04 DIAGNOSIS — I2510 Atherosclerotic heart disease of native coronary artery without angina pectoris: Secondary | ICD-10-CM | POA: Diagnosis present

## 2022-06-04 DIAGNOSIS — J45909 Unspecified asthma, uncomplicated: Secondary | ICD-10-CM | POA: Diagnosis present

## 2022-06-04 DIAGNOSIS — Z1152 Encounter for screening for COVID-19: Secondary | ICD-10-CM

## 2022-06-04 DIAGNOSIS — Z8249 Family history of ischemic heart disease and other diseases of the circulatory system: Secondary | ICD-10-CM

## 2022-06-04 DIAGNOSIS — R0609 Other forms of dyspnea: Secondary | ICD-10-CM | POA: Diagnosis not present

## 2022-06-04 DIAGNOSIS — J90 Pleural effusion, not elsewhere classified: Secondary | ICD-10-CM | POA: Diagnosis not present

## 2022-06-04 DIAGNOSIS — R0603 Acute respiratory distress: Secondary | ICD-10-CM | POA: Diagnosis not present

## 2022-06-04 DIAGNOSIS — I77810 Thoracic aortic ectasia: Secondary | ICD-10-CM | POA: Diagnosis present

## 2022-06-04 DIAGNOSIS — I110 Hypertensive heart disease with heart failure: Secondary | ICD-10-CM | POA: Diagnosis not present

## 2022-06-04 DIAGNOSIS — I480 Paroxysmal atrial fibrillation: Secondary | ICD-10-CM | POA: Diagnosis not present

## 2022-06-04 DIAGNOSIS — I5030 Unspecified diastolic (congestive) heart failure: Secondary | ICD-10-CM | POA: Diagnosis not present

## 2022-06-04 DIAGNOSIS — N179 Acute kidney failure, unspecified: Secondary | ICD-10-CM | POA: Diagnosis not present

## 2022-06-04 DIAGNOSIS — Z79899 Other long term (current) drug therapy: Secondary | ICD-10-CM | POA: Diagnosis not present

## 2022-06-04 DIAGNOSIS — Z96651 Presence of right artificial knee joint: Secondary | ICD-10-CM | POA: Diagnosis not present

## 2022-06-04 DIAGNOSIS — R778 Other specified abnormalities of plasma proteins: Secondary | ICD-10-CM | POA: Diagnosis not present

## 2022-06-04 DIAGNOSIS — R7989 Other specified abnormal findings of blood chemistry: Secondary | ICD-10-CM

## 2022-06-04 DIAGNOSIS — R0602 Shortness of breath: Secondary | ICD-10-CM | POA: Diagnosis not present

## 2022-06-04 DIAGNOSIS — Z7901 Long term (current) use of anticoagulants: Secondary | ICD-10-CM | POA: Diagnosis not present

## 2022-06-04 DIAGNOSIS — I340 Nonrheumatic mitral (valve) insufficiency: Secondary | ICD-10-CM | POA: Diagnosis not present

## 2022-06-04 DIAGNOSIS — I429 Cardiomyopathy, unspecified: Secondary | ICD-10-CM | POA: Diagnosis present

## 2022-06-04 DIAGNOSIS — Z888 Allergy status to other drugs, medicaments and biological substances status: Secondary | ICD-10-CM | POA: Diagnosis not present

## 2022-06-04 DIAGNOSIS — T502X5A Adverse effect of carbonic-anhydrase inhibitors, benzothiadiazides and other diuretics, initial encounter: Secondary | ICD-10-CM | POA: Diagnosis not present

## 2022-06-04 DIAGNOSIS — R0902 Hypoxemia: Secondary | ICD-10-CM | POA: Diagnosis present

## 2022-06-04 LAB — CBC
HCT: 39.5 % (ref 39.0–52.0)
Hemoglobin: 13.6 g/dL (ref 13.0–17.0)
MCH: 34.9 pg — ABNORMAL HIGH (ref 26.0–34.0)
MCHC: 34.4 g/dL (ref 30.0–36.0)
MCV: 101.3 fL — ABNORMAL HIGH (ref 80.0–100.0)
Platelets: 275 10*3/uL (ref 150–400)
RBC: 3.9 MIL/uL — ABNORMAL LOW (ref 4.22–5.81)
RDW: 12.6 % (ref 11.5–15.5)
WBC: 7.3 10*3/uL (ref 4.0–10.5)
nRBC: 0 % (ref 0.0–0.2)

## 2022-06-04 LAB — RESP PANEL BY RT-PCR (FLU A&B, COVID) ARPGX2
Influenza A by PCR: NEGATIVE
Influenza B by PCR: NEGATIVE
SARS Coronavirus 2 by RT PCR: NEGATIVE

## 2022-06-04 LAB — BASIC METABOLIC PANEL
Anion gap: 9 (ref 5–15)
BUN: 16 mg/dL (ref 8–23)
CO2: 23 mmol/L (ref 22–32)
Calcium: 8.8 mg/dL — ABNORMAL LOW (ref 8.9–10.3)
Chloride: 106 mmol/L (ref 98–111)
Creatinine, Ser: 1.13 mg/dL (ref 0.61–1.24)
GFR, Estimated: >60 mL/min (ref >60)
Glucose, Bld: 112 mg/dL — ABNORMAL HIGH (ref 70–99)
Potassium: 4.1 mmol/L (ref 3.5–5.1)
Sodium: 138 mmol/L (ref 135–145)

## 2022-06-04 LAB — BRAIN NATRIURETIC PEPTIDE: B Natriuretic Peptide: 317.8 pg/mL — ABNORMAL HIGH (ref 0.0–100.0)

## 2022-06-04 LAB — TROPONIN I (HIGH SENSITIVITY)
Troponin I (High Sensitivity): 102 ng/L (ref <18)
Troponin I (High Sensitivity): 204 ng/L (ref <18)
Troponin I (High Sensitivity): 473 ng/L (ref <18)

## 2022-06-04 MED ORDER — FUROSEMIDE 10 MG/ML IJ SOLN
40.0000 mg | Freq: Once | INTRAMUSCULAR | Status: AC
  Administered 2022-06-04: 40 mg via INTRAVENOUS
  Filled 2022-06-04: qty 4

## 2022-06-04 MED ORDER — ATORVASTATIN CALCIUM 40 MG PO TABS
40.0000 mg | ORAL_TABLET | Freq: Every evening | ORAL | Status: DC
  Administered 2022-06-04 – 2022-06-05 (×2): 40 mg via ORAL
  Filled 2022-06-04 (×2): qty 1

## 2022-06-04 MED ORDER — FUROSEMIDE 10 MG/ML IJ SOLN
40.0000 mg | Freq: Two times a day (BID) | INTRAMUSCULAR | Status: DC
  Administered 2022-06-05 – 2022-06-06 (×3): 40 mg via INTRAVENOUS
  Filled 2022-06-04 (×3): qty 4

## 2022-06-04 MED ORDER — SODIUM CHLORIDE 0.9% FLUSH
3.0000 mL | Freq: Two times a day (BID) | INTRAVENOUS | Status: DC
  Administered 2022-06-04 – 2022-06-07 (×6): 3 mL via INTRAVENOUS

## 2022-06-04 MED ORDER — AMIODARONE HCL 200 MG PO TABS
200.0000 mg | ORAL_TABLET | Freq: Every day | ORAL | Status: DC
  Administered 2022-06-05 – 2022-06-07 (×3): 200 mg via ORAL
  Filled 2022-06-04 (×3): qty 1

## 2022-06-04 MED ORDER — ONDANSETRON HCL 4 MG/2ML IJ SOLN: 4.0000 mg | Freq: Four times a day (QID) | INTRAMUSCULAR | Status: DC | PRN

## 2022-06-04 MED ORDER — LORATADINE 10 MG PO TABS
10.0000 mg | ORAL_TABLET | Freq: Every day | ORAL | Status: DC
  Administered 2022-06-05 – 2022-06-07 (×3): 10 mg via ORAL
  Filled 2022-06-04 (×3): qty 1

## 2022-06-04 MED ORDER — HYDRALAZINE HCL 25 MG PO TABS: 25.0000 mg | ORAL_TABLET | Freq: Two times a day (BID) | ORAL | Status: DC

## 2022-06-04 MED ORDER — SODIUM CHLORIDE 0.9 % IV SOLN: 250.0000 mL | INTRAVENOUS | Status: DC | PRN

## 2022-06-04 MED ORDER — AMLODIPINE BESYLATE 10 MG PO TABS
10.0000 mg | ORAL_TABLET | Freq: Every day | ORAL | Status: DC
  Administered 2022-06-05: 10 mg via ORAL
  Filled 2022-06-04: qty 1
  Filled 2022-06-04: qty 2

## 2022-06-04 MED ORDER — ACETAMINOPHEN 325 MG PO TABS: 650.0000 mg | ORAL_TABLET | ORAL | Status: DC | PRN

## 2022-06-04 MED ORDER — ASPIRIN 81 MG PO CHEW
324.0000 mg | CHEWABLE_TABLET | Freq: Once | ORAL | Status: AC
  Administered 2022-06-04: 324 mg via ORAL
  Filled 2022-06-04: qty 4

## 2022-06-04 MED ORDER — LOSARTAN POTASSIUM 50 MG PO TABS
100.0000 mg | ORAL_TABLET | Freq: Every day | ORAL | Status: DC
  Administered 2022-06-05 – 2022-06-07 (×3): 100 mg via ORAL
  Filled 2022-06-04 (×3): qty 2

## 2022-06-04 MED ORDER — SPIRONOLACTONE 25 MG PO TABS
25.0000 mg | ORAL_TABLET | Freq: Every day | ORAL | Status: DC
  Administered 2022-06-05 – 2022-06-07 (×3): 25 mg via ORAL
  Filled 2022-06-04 (×3): qty 1

## 2022-06-04 MED ORDER — SODIUM CHLORIDE 0.9% FLUSH
3.0000 mL | INTRAVENOUS | Status: DC | PRN
  Administered 2022-06-05: 3 mL via INTRAVENOUS

## 2022-06-04 MED ORDER — IOHEXOL 350 MG/ML SOLN
80.0000 mL | Freq: Once | INTRAVENOUS | Status: AC | PRN
  Administered 2022-06-04: 80 mL via INTRAVENOUS

## 2022-06-04 MED ORDER — APIXABAN 5 MG PO TABS
5.0000 mg | ORAL_TABLET | Freq: Two times a day (BID) | ORAL | Status: DC
  Administered 2022-06-04 – 2022-06-07 (×6): 5 mg via ORAL
  Filled 2022-06-04 (×6): qty 1

## 2022-06-04 NOTE — H&P (Signed)
Cardiology Admission History and Physical   Patient ID: KHALEE MAZO MRN: [id number]; DOB: 17-Aug-1932   Admission date: 06/04/2022  PCP:  Lavone Orn, MD   [facility] Cardiologist:  Kirk Ruths, MD        Chief Complaint: dyspnea  Patient Profile:   [phone] with paroxysmal AF (diagnosed 01/2017, s/p DCCV 2018 and 2021, on amiodarone and apixaban), moderate non obstructive CAD by [facility] 05/23/22 (10% LM, 40% pLAD), HFpEF, HTN who presents with worsening dyspnea on exertion and lower extremity edema.  History of Present Illness:   58M with paroxysmal AF (diagnosed 01/2017, s/p DCCV 2018 and 2021, on amiodarone and apixaban), moderate non obstructive CAD by [facility] 05/23/22 (10% LM, 40% pLAD), HFpEF, HTN who presents with worsening dyspnea on exertion and lower extremity edema. Four months ago he was seen in ED for similar symptoms and diagnosed with HFpEF exacerbation. He received lasix and discharged on lasix 20 mg daily. Metoprolol and diltiazem were discontinued due to bradycardia and lightheadedness. He followed up in cardiology clinic and underwent stress PET / CT which was positive. He underwent LHC on 05/23/22 which showed moderate non obstructive CAD and high LVEDP 24 mmHg. Lasix was increased to 40 mg daily. Over the past 1-2 weeks has has noticed worsening dyspnea with exertion. Also with mild lower extremity edema and mild dry cough. No orthopnea, palpitations, lightheadedness, dizziness, falls, chest pain, fevers, chills, sick contacts. He is compliant with his medications.  In ED he underwent CT PE which was negative for embolus but did show signs of pulmonary edema. BNP elevated at 317, similar to his ED admission in June at which time it was 363. Troponin 102 - 204 - 473. EKG NSR. Normal Hb. Cr slightly elevated at 1.13 from normal baseline. Covid / flu negative   Past Medical History:  Diagnosis Date   Arthritis    right knee oa   Asthma    Carpal tunnel  syndrome    BILATERAL   Cholecystitis    Complication of anesthesia    adverse reaction to the spinal   Hypertension    PAF (paroxysmal atrial fibrillation) (HCC)    SBO (small bowel obstruction) ([place]) 12/2015    Past Surgical History:  Procedure Laterality Date   CARDIOVERSION N/A 02/14/2017   Procedure: CARDIOVERSION;  Surgeon: Josue Hector, MD;  Location: [place];  Service: Cardiovascular;  Laterality: N/A;   CARDIOVERSION N/A 12/11/2019   Procedure: CARDIOVERSION;  Surgeon: Lelon Perla, MD;  Location: [place];  Service: Cardiovascular;  Laterality: N/A;   CARDIOVERSION N/A 02/13/2022   Procedure: CARDIOVERSION;  Surgeon: Lelon Perla, MD;  Location: [facility] ENDOSCOPY;  Service: Cardiovascular;  Laterality: N/A;   CHOLECYSTECTOMY  02/09/2017   CHOLECYSTECTOMY N/A 02/09/2017   Procedure: LAPAROSCOPIC CHOLECYSTECTOMY WITH INTRAOPERATIVE CHOLANGIOGRAM;  Surgeon: Donnie Mesa, MD;  Location: [place];  Service: General;  Laterality: N/A;   COLONOSCOPY WITH PROPOFOL N/A 11/09/2014   Procedure: COLONOSCOPY WITH PROPOFOL;  Surgeon: Garlan Fair, MD;  Location: [facility] ENDOSCOPY;  Service: Endoscopy;  Laterality: N/A;   HERNIA REPAIR     2013   LEFT HEART CATH AND CORONARY ANGIOGRAPHY N/A 05/23/2022   Procedure: LEFT HEART CATH AND CORONARY ANGIOGRAPHY;  Surgeon: Martinique, Peter M, MD;  Location: [place] CV LAB;  Service: Cardiovascular;  Laterality: N/A;   QUADRICEPS TENDON REPAIR Left 06/07/2015   Procedure: REPAIR LEFT QUADRICEP TENDON;  Surgeon: Gaynelle Arabian, MD;  Location: [facility] ORS;  Service: Orthopedics;  Laterality:  Left;   TEE WITHOUT CARDIOVERSION N/A 02/14/2017   Procedure: TRANSESOPHAGEAL ECHOCARDIOGRAM (TEE);  Surgeon: Josue Hector, MD;  Location: [facility] ENDOSCOPY;  Service: Cardiovascular;  Laterality: N/A;   TOTAL KNEE ARTHROPLASTY Right 12/27/2015   Procedure: RIGHT TOTAL KNEE ARTHROPLASTY;  Surgeon: Gaynelle Arabian, MD;  Location: [facility] OR[facility];  Service: Orthopedics;   Laterality: Right;     Medications Prior to Admission: Prior to Admission medications   Medication Sig Start Date End Date Taking? Authorizing Provider  amiodarone (PACERONE) 200 MG tablet Take 1 tablet (200 mg total) by mouth daily. 01/06/22   Lelon Perla, MD  amLODipine (NORVASC) 10 MG tablet Take 1 tablet (10 mg total) by mouth daily. 05/12/22   Lelon Perla, MD  Ascorbic Acid (VITAMIN C) 1000 MG tablet Take 1,000 mg by mouth in the morning.    [provider]  cetirizine (ZYRTEC) 10 MG tablet Take 10 mg by mouth in the morning.    [provider]  COVID-19 mRNA bivalent vaccine, Pfizer, (PFIZER COVID-19 VAC BIVALENT) injection Inject into the muscle. 07/05/21   Carlyle Basques, MD  COVID-19 mRNA Vac-TriS, Pfizer, (PFIZER-BIONT COVID-19 VAC-TRIS) SUSP injection Inject into the muscle. 12/08/20   Carlyle Basques, MD  ELIQUIS 5 MG TABS tablet TAKE 1 TABLET(5 MG) BY MOUTH TWICE DAILY 03/13/22   Lelon Perla, MD  fluticasone (FLOVENT HFA) 110 MCG/ACT inhaler Inhale 1 puff into the lungs 2 (two) times daily.     [provider]  furosemide (LASIX) 20 MG tablet Take 2 tablets (40 mg total) by mouth daily. 06/02/22   Lelon Perla, MD  hydrALAZINE (APRESOLINE) 25 MG tablet Take 1 tablet (25 mg total) by mouth 2 (two) times daily. 05/12/22   Lelon Perla, MD  influenza vaccine adjuvanted (FLUAD) 0.5 ML injection Inject into the muscle. 05/09/22     losartan (COZAAR) 100 MG tablet Take 1 tablet (100 mg total) by mouth daily. 05/12/22   Lelon Perla, MD     Allergies:    Allergies  Allergen Reactions   Zestril [Lisinopril] Other (See Comments)    Unknown reaction type   Bee Venom Swelling and Rash    Social History:   Social History   Socioeconomic History   Marital status: Married    Spouse name: Not on file   Number of children: Not on file   Years of education: Not on file   Highest education level: Not on file  Occupational History    Not on file  Tobacco Use   Smoking status: Never   Smokeless tobacco: Never  Vaping Use   Vaping Use: Never used  Substance and Sexual Activity   Alcohol use: Yes    Comment:  2 beers daily   Drug use: No   Sexual activity: Not on file  Other Topics Concern   Not on file  Social History Narrative   Not on file   Social Determinants of Health   Financial Resource Strain: Not on file  Food Insecurity: Not on file  Transportation Needs: Not on file  Physical Activity: Not on file  Stress: Not on file  Social Connections: Not on file  Intimate Partner Violence: Not on file    Family History:   The patient's family history includes Hypertension in his father.    ROS:  Please see the history of present illness.  All other ROS reviewed and negative.     Physical Exam/Data:   Vitals:   06/04/22 1615 06/04/22  1630 06/04/22 1715 06/04/22 1745  BP: 118/69 115/69 125/73 111/72  Pulse: 70 70 70 65  Resp: 19 (!) 24 (!) 23 (!) 24  Temp:      TempSrc:      SpO2: 97% 97% 96% 98%   No intake or output data in the 24 hours ending 06/04/22 1810    05/23/2022    7:37 AM 05/19/2022    2:40 PM 03/20/2022   11:23 AM  Last 3 Weights  Weight (lbs) 191 lb 197 lb 3.2 oz 193 lb 9.6 oz  Weight (kg) 86.637 kg 89.449 kg 87.816 kg     There is no height or weight on file to calculate BMI.  General:  Well nourished, well developed, in no acute distress HEENT: normal Neck: JVP 8 cm Vascular: No carotid bruits; Distal pulses 2+ bilaterally   Cardiac:  normal S1, S2; RRR; no murmur Lungs:  mild bibasilar crackles, no wheezing, breathing comfortably on room air Abd: soft, nontender, no hepatomegaly  Ext: trace pitting edema bilaterally Musculoskeletal:  No deformities Skin: warm and dry  Neuro:  Alert and oriented Psych:  Normal affect    EKG:  The ECG that was done was personally reviewed and demonstrates NSR  Laboratory Data:  High Sensitivity Troponin:   Recent Labs  Lab  06/04/22 0932 06/04/22 1143 06/04/22 1539  TROPONINIHS 102* 204* 473*      Chemistry Recent Labs  Lab 06/04/22 0932  NA 138  K 4.1  CL 106  CO2 23  GLUCOSE 112*  BUN 16  CREATININE 1.13  CALCIUM 8.8*  GFRNONAA >60  ANIONGAP 9    No results for input(s): "PROT", "ALBUMIN", "AST", "ALT", "ALKPHOS", "BILITOT" in the last 168 hours. Lipids No results for input(s): "CHOL", "TRIG", "HDL", "LABVLDL", "LDLCALC", "CHOLHDL" in the last 168 hours. Hematology Recent Labs  Lab 06/04/22 0932  WBC 7.3  RBC 3.90*  HGB 13.6  HCT 39.5  MCV 101.3*  MCH 34.9*  MCHC 34.4  RDW 12.6  PLT 275   Thyroid No results for input(s): "TSH", "FREET4" in the last 168 hours. BNP Recent Labs  Lab 06/04/22 0932  BNP 317.8*    DDimer No results for input(s): "DDIMER" in the last 168 hours.   Radiology/Studies:  CT Angio Chest PE W and/or Wo Contrast  Result Date: 06/04/2022 CLINICAL DATA:  Short of breath beginning approximately 1 week ago after cardiac catheterization. EXAM: CT ANGIOGRAPHY CHEST WITH CONTRAST TECHNIQUE: Multidetector CT imaging of the chest was performed using the standard protocol during bolus administration of intravenous contrast. Multiplanar CT image reconstructions and MIPs were obtained to evaluate the vascular anatomy. RADIATION DOSE REDUCTION: This exam was performed according to the departmental dose-optimization program which includes automated exposure control, adjustment of the mA and/or kV according to patient size and/or use of iterative reconstruction technique. CONTRAST:  91m OMNIPAQUE IOHEXOL 350 MG/ML SOLN COMPARISON:  Current chest radiograph. FINDINGS: Cardiovascular: Pulmonary arteries are well opacified. There is no evidence of a pulmonary embolism. Heart is mildly enlarged. Three-vessel coronary artery calcifications. No pericardial effusion. Aorta is not opacified. Normal caliber great vessels. Mild aortic atherosclerotic calcifications. Mediastinum/Nodes: No  neck base, mediastinal or hilar masses. Prominent mediastinal lymph nodes, largest a right paratracheal node 1.2 cm in short axis. Trachea and esophagus are unremarkable. Lungs/Pleura: Small bilateral pleural effusions. Bilateral interstitial thickening, most evident at the lung bases. Bronchial wall thickening noted in both lower lobes. Mild ground-glass type opacity also noted in the lower lobes and dependent upper  lobes. No pneumothorax. Upper Abdomen: No acute abnormality. Musculoskeletal: No fracture or acute finding. No bone lesion. No chest wall mass. Review of the MIP images confirms the above findings. IMPRESSION: 1. No evidence of a pulmonary embolism. 2. Findings consistent with congestive heart failure, with mild cardiomegaly, small effusions, bilateral interstitial thickening as well as hazy dependent lung opacities consistent with edema. Aortic Atherosclerosis (ICD10-I70.0). Electronically Signed   By: Lajean Manes M.D.   On: 06/04/2022 14:00   DG Chest 2 View  Result Date: 06/04/2022 CLINICAL DATA:  Short of breath. EXAM: CHEST - 2 VIEW COMPARISON:  02/17/2022 and prior studies. FINDINGS: Cardiac silhouette is normal in size. No mediastinal or hilar masses. No evidence of adenopathy. Bilateral irregular interstitial thickening, stable from the prior exam. No lung consolidation. No pleural effusion and no pneumothorax. Skeletal structures are demineralized, grossly intact. IMPRESSION: 1. No acute cardiopulmonary disease. 2. Bilateral interstitial thickening suspicious for interstitial lung disease. Consider follow-up high-resolution chest CT for further assessment. Electronically Signed   By: Lajean Manes M.D.   On: 06/04/2022 09:57     Assessment and Plan:  21M with paroxysmal AF (diagnosed 01/2017, s/p DCCV 2018 and 2021, on amiodarone and apixaban), moderate non obstructive CAD by [facility] 05/23/22 (10% LM, 40% pLAD), HFpEF, HTN who presents with worsening dyspnea on exertion consistent with HF  exacerbation.  HFpEF exacerbation - IV lasix 40 mg bid - start spironolactone 25 mg daily - price check empagliflozin and prescribe on discharge if affordable - repeat TTE  Troponin elevation - likely in setting of HF exacerbation - trend troponin / EKG until peak  CAD - not on aspirin since on anticoagulation - order lipid profile and start atorvastatin 20 mg  Paroxysmal AF - CHADS-VASc 5 (age, HTN, HF, CAD) - continue apixaban 5 mg bid - continue amiodarone 200 mg daily    - obtain baseline TSH and LFTs  - recommend outpatient PFTs and outpatient referral to ophtho for baseline eye exam  HTN - BP at goal - continue losartan 100 mg daily and amlodipine 10 mg daily - d/c hydralazine - spironolactone 25 mg as above  Diet: cardiac Code: full DVT px: apixaban  Risk Assessment/Risk Scores:       New York Heart Association (NYHA) Functional Class NYHA Class II  CHA2DS2-VASc Score = 5      Severity of Illness: The appropriate patient status for this patient is INPATIENT. Inpatient status is judged to be reasonable and necessary in order to provide the required intensity of service to ensure the patient's safety. The patient's presenting symptoms, physical exam findings, and initial radiographic and laboratory data in the context of their chronic comorbidities is felt to place them at high risk for further clinical deterioration. Furthermore, it is not anticipated that the patient will be medically stable for discharge from the hospital within 2 midnights of admission.   * I certify that at the point of admission it is my clinical judgment that the patient will require inpatient hospital care spanning beyond 2 midnights from the point of admission due to high intensity of service, high risk for further deterioration and high frequency of surveillance required.*   For questions or updates, please contact [place] Please consult [email or web address] for contact info  under     Signed, Georgette Shell  06/04/2022 6:10 PM

## 2022-06-04 NOTE — ED Triage Notes (Signed)
Patient here with complaint of shortness of breath that started after approximately one week ago after a cardiac catheterization. Patient states shortness of breath was worse than it has been while taking a shower this morning. Patient denies shortness of breath at rest, reports shortness of breath occurs with minimal exertion. Patient is alert, oriented, speaking in complete sentences,a and is in no apparent distress.

## 2022-06-04 NOTE — Consult Note (Deleted)
Cardiology Admission History and Physical   Patient ID: Daniel Yu MRN: [id number]; DOB: 25-Sep-1931   Admission date: 06/04/2022  PCP:  Lavone Orn, MD   [place] Prov[facility] Cardiologist:  Kirk Ruths, MD        Chief Complaint: dyspnea  Patient Profile:   [phone] with paroxysmal AF (diagnosed 01/2017, s/p DCCV 2018 and 2021, on amiodarone and apixaban), moderate non obstructive CAD by [facility] 05/23/22 (10% LM, 40% pLAD), HFpEF, HTN who presents with worsening dyspnea on exertion and lower extremity edema.  History of Present Illness:   76M with paroxysmal AF (diagnosed 01/2017, s/p DCCV 2018 and 2021, on amiodarone and apixaban), moderate non obstructive CAD by [facility] 05/23/22 (10% LM, 40% pLAD), HFpEF, HTN who presents with worsening dyspnea on exertion and lower extremity edema. Four months ago he was seen in ED for similar symptoms and diagnosed with HFpEF exacerbation. He received lasix and discharged on lasix 20 mg daily. Metoprolol and diltiazem were discontinued due to bradycardia and lightheadedness. He followed up in cardiology clinic and underwent stress PET / CT which was positive. He underwent LHC on 05/23/22 which showed moderate non obstructive CAD and high LVEDP 24 mmHg. Lasix was increased to 40 mg daily. Over the past 1-2 weeks has has noticed worsening dyspnea with exertion. Also with mild lower extremity edema and mild dry cough. No orthopnea, palpitations, lightheadedness, dizziness, falls, chest pain, fevers, chills, sick contacts. He is compliant with his medications.  In ED he underwent CT PE which was negative for embolus but did show signs of pulmonary edema. BNP elevated at 317, similar to his ED admission in June at which time it was 363. Troponin 102 - 204 - 473. EKG NSR. Normal Hb. Cr slightly elevated at 1.13 from normal baseline. Covid / flu negative   Past Medical History:  Diagnosis Date   Arthritis    right knee oa   Asthma    Carpal tunnel  syndrome    BILATERAL   Cholecystitis    Complication of anesthesia    adverse reaction to the spinal   Hypertension    PAF (paroxysmal atrial fibrillation) (HCC)    SBO (small bowel obstruction) ([place]) 12/2015    Past Surgical History:  Procedure Laterality Date   CARDIOVERSION N/A 02/14/2017   Procedure: CARDIOVERSION;  Surgeon: Josue Hector, MD;  Location: [place];  Service: Cardiovascular;  Laterality: N/A;   CARDIOVERSION N/A 12/11/2019   Procedure: CARDIOVERSION;  Surgeon: Lelon Perla, MD;  Location: [place];  Service: Cardiovascular;  Laterality: N/A;   CARDIOVERSION N/A 02/13/2022   Procedure: CARDIOVERSION;  Surgeon: Lelon Perla, MD;  Location: [facility] END[facility]COPY;  Service: Cardiovascular;  Laterality: N/A;   CHOLECYSTECTOMY  02/09/2017   CHOLECYSTECTOMY N/A 02/09/2017   Procedure: LAPAROSCOPIC CHOLECYSTECTOMY WITH INTRAOPERATIVE CHOLANGIOGRAM;  Surgeon: Donnie [place], MD;  Location: [place];  Service: General;  Laterality: N/A;   COLONOSCOPY WITH PROPOFOL N/A 11/09/2014   Procedure: COLONOSCOPY WITH PROPOFOL;  Surgeon: Garlan Fair, MD;  Location: [facility] ENDOSCOPY;  Service: Endoscopy;  Laterality: N/A;   HERNIA REPAIR     2013   LEFT HEART CATH AND CORONARY ANGIOGRAPHY N/A 05/23/2022   Procedure: LEFT HEART CATH AND CORONARY ANGIOGRAPHY;  Surgeon: Martinique, Peter M, MD;  Location: [place] CV LAB;  Service: Cardiovascular;  Laterality: N/A;   QUADRICEPS TENDON REPAIR Left 06/07/2015   Procedure: REPAIR LEFT QUADRICEP TENDON;  Surgeon: Gaynelle Arabian, MD;  Location: [facility];  Service: Orthopedics;  Laterality:  Left;   TEE WITHOUT CARDIOVERSION N/A 02/14/2017   Procedure: TRANSESOPHAGEAL ECHOCARDIOGRAM (TEE);  Surgeon: Josue Hector, MD;  Location: [facility] ENDOSCOPY;  Service: Cardiovascular;  Laterality: N/A;   TOTAL KNEE ARTHROPLASTY Right 12/27/2015   Procedure: RIGHT TOTAL KNEE ARTHROPLASTY;  Surgeon: Gaynelle Arabian, MD;  Location: [facility];  Service: Orthopedics;   Laterality: Right;     Medications Prior to Admission: Prior to Admission medications   Medication Sig Start Date End Date Taking? Authorizing Provider  amiodarone (PACERONE) 200 MG tablet Take 1 tablet (200 mg total) by mouth daily. 01/06/22   Lelon Perla, MD  amLODipine (NORVASC) 10 MG tablet Take 1 tablet (10 mg total) by mouth daily. 05/12/22   Lelon Perla, MD  Ascorbic Acid (VITAMIN C) 1000 MG tablet Take 1,000 mg by mouth in the morning.    [provider]  cetirizine (ZYRTEC) 10 MG tablet Take 10 mg by mouth in the morning.    [provider]  COVID-19 mRNA bivalent vaccine, Pfizer, (PFIZER COVID-19 VAC BIVALENT) injection Inject into the muscle. 07/05/21   Carlyle Basques, MD  COVID-19 mRNA Vac-TriS, Pfizer, (PFIZER-BIONT COVID-19 VAC-TRIS) SUSP injection Inject into the muscle. 12/08/20   Carlyle Basques, MD  ELIQUIS 5 MG TABS tablet TAKE 1 TABLET(5 MG) BY MOUTH TWICE DAILY 03/13/22   Lelon Perla, MD  fluticasone (FLOVENT HFA) 110 MCG/ACT inhaler Inhale 1 puff into the lungs 2 (two) times daily.     [provider]  furosemide (LASIX) 20 MG tablet Take 2 tablets (40 mg total) by mouth daily. 06/02/22   Lelon Perla, MD  hydrALAZINE (APRESOLINE) 25 MG tablet Take 1 tablet (25 mg total) by mouth 2 (two) times daily. 05/12/22   Lelon Perla, MD  influenza vaccine adjuvanted (FLUAD) 0.5 ML injection Inject into the muscle. 05/09/22     losartan (COZAAR) 100 MG tablet Take 1 tablet (100 mg total) by mouth daily. 05/12/22   Lelon Perla, MD     Allergies:    Allergies  Allergen Reactions   Zestril [Lisinopril] Other (See Comments)    Unknown reaction type   Bee Venom Swelling and Rash    Social History:   Social History   Socioeconomic History   Marital status: Married    Spouse name: Not on file   Number of children: Not on file   Years of education: Not on file   Highest education level: Not on file  Occupational History    Not on file  Tobacco Use   Smoking status: Never   Smokeless tobacco: Never  Vaping Use   Vaping Use: Never used  Substance and Sexual Activity   Alcohol use: Yes    Comment:  2 beers daily   Drug use: No   Sexual activity: Not on file  Other Topics Concern   Not on file  Social History Narrative   Not on file   Social Determinants of Health   Financial Resource Strain: Not on file  Food Insecurity: Not on file  Transportation Needs: Not on file  Physical Activity: Not on file  Stress: Not on file  Social Connections: Not on file  Intimate Partner Violence: Not on file    Family History:   The patient's family history includes Hypertension in his father.    ROS:  Please see the history of present illness.  All other ROS reviewed and negative.     Physical Exam/Data:   Vitals:   06/04/22 1615 06/04/22  1630 06/04/22 1715 06/04/22 1745  BP: 118/69 115/69 125/73 111/72  Pulse: 70 70 70 65  Resp: 19 (!) 24 (!) 23 (!) 24  Temp:      TempSrc:      SpO2: 97% 97% 96% 98%   No intake or output data in the 24 hours ending 06/04/22 1810    05/23/2022    7:37 AM 05/19/2022    2:40 PM 03/20/2022   11:23 AM  Last 3 Weights  Weight (lbs) 191 lb 197 lb 3.2 oz 193 lb 9.6 oz  Weight (kg) 86.637 kg 89.449 kg 87.816 kg     There is no height or weight on file to calculate BMI.  General:  Well nourished, well developed, in no acute distress HEENT: normal Neck: JVP 8 cm Vascular: No carotid bruits; Distal pulses 2+ bilaterally   Cardiac:  normal S1, S2; RRR; no murmur Lungs:  mild bibasilar crackles, no wheezing, breathing comfortably on room air Abd: soft, nontender, no hepatomegaly  Ext: trace pitting edema bilaterally Musculoskeletal:  No deformities Skin: warm and dry  Neuro:  Alert and oriented Psych:  Normal affect    EKG:  The ECG that was done was personally reviewed and demonstrates NSR  Laboratory Data:  High Sensitivity Troponin:   Recent Labs  Lab  06/04/22 0932 06/04/22 1143 06/04/22 1539  TROPONINIHS 102* 204* 473*      Chemistry Recent Labs  Lab 06/04/22 0932  NA 138  K 4.1  CL 106  CO2 23  GLUCOSE 112*  BUN 16  CREATININE 1.13  CALCIUM 8.8*  GFRNONAA >60  ANIONGAP 9    No results for input(s): "PROT", "ALBUMIN", "AST", "ALT", "ALKPHOS", "BILITOT" in the last 168 hours. Lipids No results for input(s): "CHOL", "TRIG", "HDL", "LABVLDL", "LDLCALC", "CHOLHDL" in the last 168 hours. Hematology Recent Labs  Lab 06/04/22 0932  WBC 7.3  RBC 3.90*  HGB 13.6  HCT 39.5  MCV 101.3*  MCH 34.9*  MCHC 34.4  RDW 12.6  PLT 275   Thyroid No results for input(s): "TSH", "FREET4" in the last 168 hours. BNP Recent Labs  Lab 06/04/22 0932  BNP 317.8*    DDimer No results for input(s): "DDIMER" in the last 168 hours.   Radiology/Studies:  CT Angio Chest PE W and/or Wo Contrast  Result Date: 06/04/2022 CLINICAL DATA:  Short of breath beginning approximately 1 week ago after cardiac catheterization. EXAM: CT ANGIOGRAPHY CHEST WITH CONTRAST TECHNIQUE: Multidetector CT imaging of the chest was performed using the standard protocol during bolus administration of intravenous contrast. Multiplanar CT image reconstructions and MIPs were obtained to evaluate the vascular anatomy. RADIATION DOSE REDUCTION: This exam was performed according to the departmental dose-optimization program which includes automated exposure control, adjustment of the mA and/or kV according to patient size and/or use of iterative reconstruction technique. CONTRAST:  9m OMNIPAQUE IOHEXOL 350 MG/ML SOLN COMPARISON:  Current chest radiograph. FINDINGS: Cardiovascular: Pulmonary arteries are well opacified. There is no evidence of a pulmonary embolism. Heart is mildly enlarged. Three-vessel coronary artery calcifications. No pericardial effusion. Aorta is not opacified. Normal caliber great vessels. Mild aortic atherosclerotic calcifications. Mediastinum/Nodes: No  neck base, mediastinal or hilar masses. Prominent mediastinal lymph nodes, largest a right paratracheal node 1.2 cm in short axis. Trachea and esophagus are unremarkable. Lungs/Pleura: Small bilateral pleural effusions. Bilateral interstitial thickening, most evident at the lung bases. Bronchial wall thickening noted in both lower lobes. Mild ground-glass type opacity also noted in the lower lobes and dependent upper  lobes. No pneumothorax. Upper Abdomen: No acute abnormality. Musculoskeletal: No fracture or acute finding. No bone lesion. No chest wall mass. Review of the MIP images confirms the above findings. IMPRESSION: 1. No evidence of a pulmonary embolism. 2. Findings consistent with congestive heart failure, with mild cardiomegaly, small effusions, bilateral interstitial thickening as well as hazy dependent lung opacities consistent with edema. Aortic Atherosclerosis (ICD10-I70.0). Electronically Signed   By: Lajean Manes M.D.   On: 06/04/2022 14:00   DG Chest 2 View  Result Date: 06/04/2022 CLINICAL DATA:  Short of breath. EXAM: CHEST - 2 VIEW COMPARISON:  02/17/2022 and prior studies. FINDINGS: Cardiac silhouette is normal in size. No mediastinal or hilar masses. No evidence of adenopathy. Bilateral irregular interstitial thickening, stable from the prior exam. No lung consolidation. No pleural effusion and no pneumothorax. Skeletal structures are demineralized, grossly intact. IMPRESSION: 1. No acute cardiopulmonary disease. 2. Bilateral interstitial thickening suspicious for interstitial lung disease. Consider follow-up high-resolution chest CT for further assessment. Electronically Signed   By: Lajean Manes M.D.   On: 06/04/2022 09:57     Assessment and Plan:  5M with paroxysmal AF (diagnosed 01/2017, s/p DCCV 2018 and 2021, on amiodarone and apixaban), moderate non obstructive CAD by [facility] 05/23/22 (10% LM, 40% pLAD), HFpEF, HTN who presents with worsening dyspnea on exertion consistent with HF  exacerbation.  HFpEF exacerbation - IV lasix 40 mg bid - start spironolactone 25 mg daily - price check empagliflozin and prescribe on discharge if affordable - repeat TTE  Troponin elevation - likely in setting of HF exacerbation - trend troponin / EKG until peak  CAD - not on aspirin since on anticoagulation - order lipid profile and start atorvastatin 20 mg  Paroxysmal AF - CHADS-VASc 5 (age, HTN, HF, CAD) - continue apixaban 5 mg bid - continue amiodarone 200 mg daily    - obtain baseline TSH and LFTs  - recommend outpatient PFTs and outpatient referral to ophtho for baseline eye exam  HTN - BP at goal - continue losartan 100 mg daily and amlodipine 10 mg daily - d/c hydralazine - spironolactone 25 mg as above  Diet: cardiac Code: full DVT px: apixaban  Risk Assessment/Risk Scores:       New York Heart Association (NYHA) Functional Class NYHA Class II  CHA2DS2-VASc Score = 5      Severity of Illness: The appropriate patient status for this patient is INPATIENT. Inpatient status is judged to be reasonable and necessary in order to provide the required intensity of service to ensure the patient's safety. The patient's presenting symptoms, physical exam findings, and initial radiographic and laboratory data in the context of their chronic comorbidities is felt to place them at high risk for further clinical deterioration. Furthermore, it is not anticipated that the patient will be medically stable for discharge from the hospital within 2 midnights of admission.   * I certify that at the point of admission it is my clinical judgment that the patient will require inpatient hospital care spanning beyond 2 midnights from the point of admission due to high intensity of service, high risk for further deterioration and high frequency of surveillance required.*   For questions or updates, please contact [place] Please consult [email or web address] for contact info  under     Signed, Georgette Shell  06/04/2022 6:10 PM

## 2022-06-04 NOTE — ED Notes (Signed)
RN stood pt up and O2 was 86% on room air, pt walking in room O2 was 94%, Pt resting O2 99%. MD notified.

## 2022-06-04 NOTE — ED Provider Notes (Signed)
[facility] EMERGENCY DEPARTMENT Provider Note   CSN: [id number] Arrival date & time: 06/04/22  8242     History  Chief Complaint  Patient presents with   Shortness of Breath    Daniel Yu is a 88 y.o. male.  Patient as above with significant medical history as below, including asthma, PAF, SBO, hypertension who presents to the ED with complaint of difficulty breathing, lower extremity swelling.  Patient with [place] 10/3 he has been having exertional dyspnea worsened from his baseline since the [facility] Lasix was increased to 40 mg daily by Dr. Stanford Breed without much improvement.  Patient reports he was attempting to take a shower this morning and felt worsening dyspnea than he had felt over the past 2 weeks.  He has had been having ongoing dyspnea over the past year but has worsened following the the cath.  Difficulty with short walks and brief episodes of activity.  He also has had a cough for the past day, nonproductive.  No postnasal drip, rhinorrhea, congestion reported.  No chest pain, no nausea or vomiting, no lightheadedness or LOC, no numbness or tingling that seems new, no change in bowel or bladder function, mild swelling to his feet and ankles bilateral.  No falls.  He has no dyspnea at rest, no chest pain   Left heart cath 10/3 Dr. Martinique LVEF 50-55%, nonobstructive CAD  Past Medical History:  Diagnosis Date   Arthritis    right knee oa   Asthma    Carpal tunnel syndrome    BILATERAL   Cholecystitis    Complication of anesthesia    adverse reaction to the spinal   Hypertension    PAF (paroxysmal atrial fibrillation) (HCC)    SBO (small bowel obstruction) ([place]) 12/2015    Past Surgical History:  Procedure Laterality Date   CARDIOVERSION N/A 02/14/2017   Procedure: CARDIOVERSION;  Surgeon: Josue Hector, MD;  Location: [place];  Service: Cardiovascular;  Laterality: N/A;   CARDIOVERSION N/A 12/11/2019   Procedure: CARDIOVERSION;  Surgeon:  Lelon Perla, MD;  Location: [place];  Service: Cardiovascular;  Laterality: N/A;   CARDIOVERSION N/A 02/13/2022   Procedure: CARDIOVERSION;  Surgeon: Lelon Perla, MD;  Location: [facility] ENDOSCOPY;  Service: Cardiovascular;  Laterality: N/A;   CHOLECYSTECTOMY  02/09/2017   CHOLECYSTECTOMY N/A 02/09/2017   Procedure: LAPAROSCOPIC CHOLECYSTECTOMY WITH INTRAOPERATIVE CHOLANGIOGRAM;  Surgeon: Donnie Mesa, MD;  Location: [place];  Service: General;  Laterality: N/A;   COLONOSCOPY WITH PROPOFOL N/A 11/09/2014   Procedure: COLONOSCOPY WITH PROPOFOL;  Surgeon: Garlan Fair, MD;  Location: [facility] ENDOSCOPY;  Service: Endoscopy;  Laterality: N/A;   HERNIA REPAIR     2013   LEFT HEART CATH AND CORONARY ANGIOGRAPHY N/A 05/23/2022   Procedure: LEFT HEART CATH AND CORONARY ANGIOGRAPHY;  Surgeon: Martinique, Peter M, MD;  Location: [place] [facility];  Service: Cardiovascular;  Laterality: N/A;   QUADRICEPS TENDON REPAIR Left 06/07/2015   Procedure: REPAIR LEFT QUADRICEP TENDON;  Surgeon: Gaynelle Arabian, MD;  Location: [facility];  Service: Orthopedics;  Laterality: Left;   TEE WITHOUT CARDIOVERSION N/A 02/14/2017   Procedure: TRANSESOPHAGEAL ECHOCARDIOGRAM (TEE);  Surgeon: Josue Hector, MD;  Location[facility]COPY;  Service: Cardiovascular;  Laterality: N/A;   TOTAL KNEE ARTHROPLASTY Right 12/27/2015   Procedure: RIGHT TOTAL KNEE ARTHROPLASTY;  Surgeon: Gaynelle Arabian, MD;  Location: [facility];  Service: Orthopedics;  Laterality: Right;     The history is provided by the patient. No language interpreter was  used.  Shortness of Breath Associated symptoms: cough   Associated symptoms: no abdominal pain, no chest pain, no fever, no headaches, no rash and no vomiting        Home Medications Prior to Admission medications   Medication Sig Start Date End Date Taking? Authorizing Provider  amiodarone (PACERONE) 200 MG tablet Take 1 tablet (200 mg total) by mouth daily. 01/06/22   Lelon Perla, MD   amLODipine (NORVASC) 10 MG tablet Take 1 tablet (10 mg total) by mouth daily. 05/12/22   Lelon Perla, MD  Ascorbic Acid (VITAMIN C) 1000 MG tablet Take 1,000 mg by mouth in the morning.    [provider]  cetirizine (ZYRTEC) 10 MG tablet Take 10 mg by mouth in the morning.    [provider]  COVID-19 mRNA bivalent vaccine, Pfizer, (PFIZER COVID-19 VAC BIVALENT) injection Inject into the muscle. 07/05/21   Carlyle Basques, MD  COVID-19 mRNA Vac-TriS, Pfizer, (PFIZER-BIONT COVID-19 VAC-TRIS) SUSP injection Inject into the muscle. 12/08/20   Carlyle Basques, MD  ELIQUIS 5 MG TABS tablet TAKE 1 TABLET(5 MG) BY MOUTH TWICE DAILY 03/13/22   Lelon Perla, MD  fluticasone (FLOVENT HFA) 110 MCG/ACT inhaler Inhale 1 puff into the lungs 2 (two) times daily.     [provider]  furosemide (LASIX) 20 MG tablet Take 2 tablets (40 mg total) by mouth daily. 06/02/22   Lelon Perla, MD  hydrALAZINE (APRESOLINE) 25 MG tablet Take 1 tablet (25 mg total) by mouth 2 (two) times daily. 05/12/22   Lelon Perla, MD  influenza vaccine adjuvanted (FLUAD) 0.5 ML injection Inject into the muscle. 05/09/22     losartan (COZAAR) 100 MG tablet Take 1 tablet (100 mg total) by mouth daily. 05/12/22   Lelon Perla, MD      Allergies    Zestril [lisinopril] and Bee venom    Review of Systems   Review of Systems  Constitutional:  Negative for chills and fever.  HENT:  Negative for facial swelling and trouble swallowing.   Eyes:  Negative for photophobia and visual disturbance.  Respiratory:  Positive for cough and shortness of breath.   Cardiovascular:  Positive for leg swelling. Negative for chest pain and palpitations.  Gastrointestinal:  Negative for abdominal pain, nausea and vomiting.  Endocrine: Negative for polydipsia and polyuria.  Genitourinary:  Negative for difficulty urinating and hematuria.  Musculoskeletal:  Negative for gait problem and joint swelling.  Skin:   Negative for pallor and rash.  Neurological:  Negative for syncope and headaches.  Psychiatric/Behavioral:  Negative for agitation and confusion.     Physical Exam Updated Vital Signs BP 125/73   Pulse 70   Temp 98.2 F (36.8 C) (Oral)   Resp (!) 23   SpO2 96%  Physical Exam Vitals and nursing note reviewed.  Constitutional:      General: He is not in acute distress.    Appearance: He is well-developed. He is not ill-appearing, toxic-appearing or diaphoretic.  HENT:     Head: Normocephalic and atraumatic.     Right Ear: External ear normal.     Left Ear: External ear normal.     Mouth/Throat:     Mouth: Mucous membranes are moist.  Eyes:     General: No scleral icterus. Cardiovascular:     Rate and Rhythm: Normal rate and regular rhythm.     Pulses: Normal pulses.     Heart sounds: Normal heart sounds.  Pulmonary:  Effort: Pulmonary effort is normal. Tachypnea present. No accessory muscle usage or respiratory distress.     Breath sounds: Normal breath sounds. Decreased air movement present. No decreased breath sounds or wheezing.  Abdominal:     General: Abdomen is flat.     Palpations: Abdomen is soft.     Tenderness: There is no abdominal tenderness.  Musculoskeletal:        General: Normal range of motion.     Cervical back: Normal range of motion.     Right lower leg: Edema present.     Left lower leg: Edema present.     Comments: Ankle and pedal edema trace bilateral lower extremities symmetric  Skin:    General: Skin is warm and dry.     Capillary Refill: Capillary refill takes less than 2 seconds.  Neurological:     Mental Status: He is alert and oriented to person, place, and time.  Psychiatric:        Mood and Affect: Mood normal.        Behavior: Behavior normal.     ED Results / Procedures / Treatments   Labs (all labs ordered are listed, but only abnormal results are displayed) Labs Reviewed  BASIC METABOLIC PANEL - Abnormal; Notable for the  following components:      Result Value   Glucose, Bld 112 (*)    Calcium 8.8 (*)    All other components within normal limits  CBC - Abnormal; Notable for the following components:   RBC 3.90 (*)    MCV 101.3 (*)    MCH 34.9 (*)    All other components within normal limits  BRAIN NATRIURETIC PEPTIDE - Abnormal; Notable for the following components:   B Natriuretic Peptide 317.8 (*)    All other components within normal limits  TROPONIN I (HIGH SENSITIVITY) - Abnormal; Notable for the following components:   Troponin I (High Sensitivity) 102 (*)    All other components within normal limits  TROPONIN I (HIGH SENSITIVITY) - Abnormal; Notable for the following components:   Troponin I (High Sensitivity) 204 (*)    All other components within normal limits  TROPONIN I (HIGH SENSITIVITY) - Abnormal; Notable for the following components:   Troponin I (High Sensitivity) 473 (*)    All other components within normal limits  RESP PANEL BY RT-PCR (FLU A&B, COVID) [id number]    EKG EKG Interpretation  Date/Time:  Sunday June 04 2022 09:17:55 EDT Ventricular Rate:  79 PR Interval:  164 QRS Duration: 100 QT Interval:  428 QTC Calculation: 490 R Axis:   25 Text Interpretation: Normal sinus rhythm Prolonged QT Abnormal ECG When compared with ECG of 23-May-2022 08:00, PREVIOUS ECG IS PRESENT similar to prior tracing  no stemi Interpretation limited secondary to artifact Confirmed by Wynona Dove ([id number]) on 06/04/2022 12:03:07 PM  Radiology CT Angio Chest PE W and/or Wo Contrast  Result Date: 06/04/2022 CLINICAL DATA:  Short of breath beginning approximately 1 week ago after cardiac catheterization. EXAM: CT ANGIOGRAPHY CHEST WITH CONTRAST TECHNIQUE: Multidetector CT imaging of the chest was performed using the standard protocol during bolus administration of intravenous contrast. Multiplanar CT image reconstructions and MIPs were obtained to evaluate the vascular anatomy. RADIATION DOSE  REDUCTION: This exam was performed according to the departmental dose-optimization program which includes automated exposure control, adjustment of the mA and/or kV according to patient size and/or use of iterative reconstruction technique. CONTRAST:  87m OMNIPAQUE IOHEXOL 350 MG/ML SOLN COMPARISON:  Current chest radiograph.  FINDINGS: Cardiovascular: Pulmonary arteries are well opacified. There is no evidence of a pulmonary embolism. Heart is mildly enlarged. Three-vessel coronary artery calcifications. No pericardial effusion. Aorta is not opacified. Normal caliber great vessels. Mild aortic atherosclerotic calcifications. Mediastinum/Nodes: No neck base, mediastinal or hilar masses. Prominent mediastinal lymph nodes, largest a right paratracheal node 1.2 cm in short axis. Trachea and esophagus are unremarkable. Lungs/Pleura: Small bilateral pleural effusions. Bilateral interstitial thickening, most evident at the lung bases. Bronchial wall thickening noted in both lower lobes. Mild ground-glass type opacity also noted in the lower lobes and dependent upper lobes. No pneumothorax. Upper Abdomen: No acute abnormality. Musculoskeletal: No fracture or acute finding. No bone lesion. No chest wall mass. Review of the MIP images confirms the above findings. IMPRESSION: 1. No evidence of a pulmonary embolism. 2. Findings consistent with congestive heart failure, with mild cardiomegaly, small effusions, bilateral interstitial thickening as well as hazy dependent lung opacities consistent with edema. Aortic Atherosclerosis (ICD10-I70.0). Electronically Signed   By: Lajean Manes M.D.   On: 06/04/2022 14:00   DG Chest 2 View  Result Date: 06/04/2022 CLINICAL DATA:  Short of breath. EXAM: CHEST - 2 VIEW COMPARISON:  02/17/2022 and prior studies. FINDINGS: Cardiac silhouette is normal in size. No mediastinal or hilar masses. No evidence of adenopathy. Bilateral irregular interstitial thickening, stable from the prior  exam. No lung consolidation. No pleural effusion and no pneumothorax. Skeletal structures are demineralized, grossly intact. IMPRESSION: 1. No acute cardiopulmonary disease. 2. Bilateral interstitial thickening suspicious for interstitial lung disease. Consider follow-up high-resolution chest CT for further assessment. Electronically Signed   By: Lajean Manes M.D.   On: 06/04/2022 09:57    Procedures .Critical Care  Performed by: Jeanell Sparrow, DO Authorized by: Jeanell Sparrow, DO   Critical care provider statement:    Critical care time (minutes):  55   Critical care time was exclusive of:  Separately billable procedures and treating other patients   Critical care was necessary to treat or prevent imminent or life-threatening deterioration of the following conditions:  Cardiac failure and respiratory failure   Critical care was time spent personally by me on the following activities:  Development of treatment plan with patient or surrogate, discussions with consultants, evaluation of patient's response to treatment, examination of patient, ordering and review of laboratory studies, ordering and review of radiographic studies, ordering and performing treatments and interventions, pulse oximetry, re-evaluation of patient's condition, review of old charts and obtaining history from patient or surrogate   Care discussed with: admitting provider       Medications Ordered in ED Medications  aspirin chewable tablet 324 mg (324 mg Oral Given 06/04/22 1310)  iohexol (OMNIPAQUE) 350 MG/ML injection 80 mL (80 mLs Intravenous Contrast Given 06/04/22 1342)  furosemide (LASIX) injection 40 mg (40 mg Intravenous Given 06/04/22 1537)    ED Course/ Medical Decision Making/ A&P Clinical Course as of 06/04/22 1721  Sun Jun 04, 2022  1435 Spoke with Dr. Quentin Ore of cardiology, recommends giving IV Lasix, reassess.  If still symptomatic will reconsult [SG]    Clinical Course User Index [SG] Jeanell Sparrow,  DO                           Medical Decision Making Amount and/or Complexity of Data Reviewed Labs: ordered. Radiology: ordered.  Risk OTC drugs. Prescription drug management. Decision regarding hospitalization.   This patient presents to the ED with chief complaint(s) of  dyspnea cough with pertinent past medical history of PAF, CAD, LHC which further complicates the presenting complaint. The complaint involves an extensive differential diagnosis and also carries with it a high risk of complications and morbidity.    In my evaluation of this patient's dyspnea my DDx includes, but is not limited to, pneumonia, pulmonary embolism, pneumothorax, pulmonary edema, metabolic acidosis, asthma, COPD, cardiac cause, anemia, anxiety, etc.   . Serious etiologies were considered.   The initial plan is to screening labs ordered in triage, cxr.    Additional history obtained: Additional history obtained from spouse Records reviewed Primary Care Documents and prior LHC, home meds/prior labs and imaging   Independent labs interpretation:  The following labs were independently interpreted:  BMP with stable Cr, CBC stable Trop is elevated at 102, he has no chest pain, will get delta >> Repeat troponins over 200 > 460's; no cp  Independent visualization of imaging: - I independently visualized the following imaging with scope of interpretation limited to determining acute life threatening conditions related to emergency care: CXR CTPE, which revealed pulm vasc congestion c/w CHF  Cardiac monitoring was reviewed and interpreted by myself which shows NSR  Treatment and Reassessment: Lasix Asa >> minimal improvement  Consultation: - Consulted or discussed management/test interpretation w/ external professional: Dr Quentin Ore cardiology, recommends giving IV Lasix, reassess; patient without sniffing improvement following IV Lasix, hypoxic with ambulation, discussed plan for  admission.  Consideration for admission or further workup: Admission was considered   Pt with likely CHF exacerbation, hypoxia with exertion, no hypoxia at rest and no chest pain, trop is elevated >400, ECG stable on arrival, started on lasix w/ good UOP, given asa, cards was consulted, will admit to cardiology service per Dr Quentin Ore who advised hold heparin for now.     Social Determinants of health: Social History   Tobacco Use   Smoking status: Never   Smokeless tobacco: Never  Vaping Use   Vaping Use: Never used  Substance Use Topics   Alcohol use: Yes    Comment:  2 beers daily   Drug use: No            Final Clinical Impression(s) / ED Diagnoses Final diagnoses:  Acute congestive heart failure, unspecified heart failure type ([place])  Elevated troponin  DOE (dyspnea on exertion)    Rx / DC Orders ED Discharge Orders     None         Jeanell Sparrow, DO 06/04/22 1721

## 2022-06-05 ENCOUNTER — Inpatient Hospital Stay (HOSPITAL_COMMUNITY): Payer: Medicare Other

## 2022-06-05 ENCOUNTER — Other Ambulatory Visit (HOSPITAL_COMMUNITY): Payer: Self-pay

## 2022-06-05 DIAGNOSIS — R0603 Acute respiratory distress: Secondary | ICD-10-CM

## 2022-06-05 DIAGNOSIS — I480 Paroxysmal atrial fibrillation: Secondary | ICD-10-CM

## 2022-06-05 DIAGNOSIS — I2510 Atherosclerotic heart disease of native coronary artery without angina pectoris: Secondary | ICD-10-CM

## 2022-06-05 DIAGNOSIS — R7989 Other specified abnormal findings of blood chemistry: Secondary | ICD-10-CM

## 2022-06-05 DIAGNOSIS — I509 Heart failure, unspecified: Secondary | ICD-10-CM

## 2022-06-05 DIAGNOSIS — I10 Essential (primary) hypertension: Secondary | ICD-10-CM

## 2022-06-05 LAB — HEPATIC FUNCTION PANEL
ALT: 17 U/L (ref 0–44)
AST: 31 U/L (ref 15–41)
Albumin: 2.9 g/dL — ABNORMAL LOW (ref 3.5–5.0)
Alkaline Phosphatase: 55 U/L (ref 38–126)
Bilirubin, Direct: 0.4 mg/dL — ABNORMAL HIGH (ref 0.0–0.2)
Indirect Bilirubin: 0.9 mg/dL (ref 0.3–0.9)
Total Bilirubin: 1.3 mg/dL — ABNORMAL HIGH (ref 0.3–1.2)
Total Protein: 5.8 g/dL — ABNORMAL LOW (ref 6.5–8.1)

## 2022-06-05 LAB — LIPID PANEL
Cholesterol: 147 mg/dL (ref 0–200)
HDL: 52 mg/dL (ref >40)
LDL Cholesterol: 83 mg/dL (ref 0–99)
Total CHOL/HDL Ratio: 2.8 RATIO
Triglycerides: 59 mg/dL (ref <150)
VLDL: 12 mg/dL (ref 0–40)

## 2022-06-05 LAB — ECHOCARDIOGRAM COMPLETE
AR max vel: 2.48 cm2
AV Area VTI: 2.5 cm2
AV Area mean vel: 2.86 cm2
AV Mean grad: 3 mmHg
AV Peak grad: 5.9 mmHg
Ao pk vel: 1.21 m/s
Area-P 1/2: 3.53 cm2
S' Lateral: 3.3 cm

## 2022-06-05 LAB — BASIC METABOLIC PANEL
Anion gap: 11 (ref 5–15)
BUN: 18 mg/dL (ref 8–23)
CO2: 20 mmol/L — ABNORMAL LOW (ref 22–32)
Calcium: 7.8 mg/dL — ABNORMAL LOW (ref 8.9–10.3)
Chloride: 107 mmol/L (ref 98–111)
Creatinine, Ser: 1.16 mg/dL (ref 0.61–1.24)
GFR, Estimated: >60 mL/min (ref >60)
Glucose, Bld: 109 mg/dL — ABNORMAL HIGH (ref 70–99)
Potassium: 4 mmol/L (ref 3.5–5.1)
Sodium: 138 mmol/L (ref 135–145)

## 2022-06-05 LAB — TSH: TSH: 0.963 u[IU]/mL (ref 0.350–4.500)

## 2022-06-05 NOTE — ED Notes (Signed)
Transport request placed for primary [employer]. Pt transfer from [address] to [address].

## 2022-06-05 NOTE — Progress Notes (Signed)
Rounding Note    Patient Name: Daniel Yu Date of Encounter: 06/05/2022  [place] Cardiologist: Kirk Ruths, MD   Subjective   Feeling better but he notes if he was walking he would be SOB.    Inpatient Medications    Scheduled Meds:  amiodarone  200 mg Oral Daily   amLODipine  10 mg Oral Daily   apixaban  5 mg Oral BID   atorvastatin  40 mg Oral QPM   furosemide  40 mg Intravenous BID   loratadine  10 mg Oral Daily   losartan  100 mg Oral Daily   sodium chloride flush  3 mL Intravenous Q12H   spironolactone  25 mg Oral Daily   Continuous Infusions:  sodium chloride     PRN Meds: sodium chloride, acetaminophen, ondansetron (ZOFRAN) IV, sodium chloride flush   Vital Signs    Vitals:   06/05/22 0630 06/05/22 0955 06/05/22 0956 06/05/22 1030  BP: (!) 109/56  (!) 101/55 (!) 95/59  Pulse: 70  72 66  Resp: (!) '21 19 19 '$ (!) 22  Temp:      TempSrc:      SpO2: 92%  96% 95%    Intake/Output Summary (Last 24 hours) at 06/05/2022 1116 Last data filed at 06/04/2022 2322 Gross per 24 hour  Intake 3 ml  Output 1000 ml  Net -997 ml      05/23/2022    7:37 AM 05/19/2022    2:40 PM 03/20/2022   11:23 AM  Last 3 Weights  Weight (lbs) 191 lb 197 lb 3.2 oz 193 lb 9.6 oz  Weight (kg) 86.637 kg 89.449 kg 87.816 kg      Telemetry    SR - Personally Reviewed  ECG    No new - Personally Reviewed  Physical Exam   GEN: No acute distress.   Neck: No JVD Cardiac: RRR, no murmurs, rubs, or gallops.  Respiratory: few basilar rales to auscultation bilaterally. GI: Soft, nontender, non-distended  MS: No edema; No deformity. Neuro:  Nonfocal  Psych: Normal affect   Labs    High Sensitivity Troponin:   Recent Labs  Lab 06/04/22 0932 06/04/22 1143 06/04/22 1539  TROPONINIHS 102* 204* 473*     Chemistry Recent Labs  Lab 06/04/22 0932 06/05/22 0430  NA 138 138  K 4.1 4.0  CL 106 107  CO2 23 20*  GLUCOSE 112* 109*  BUN 16 18   CREATININE 1.13 1.16  CALCIUM 8.8* 7.8*  PROT  --  5.8*  ALBUMIN  --  2.9*  AST  --  31  ALT  --  17  ALKPHOS  --  55  BILITOT  --  1.3*  GFRNONAA >60 >60  ANIONGAP 9 11    Lipids  Recent Labs  Lab 06/05/22 0430  CHOL 147  TRIG 59  HDL 52  LDLCALC 83  CHOLHDL 2.8    Hematology Recent Labs  Lab 06/04/22 0932  WBC 7.3  RBC 3.90*  HGB 13.6  HCT 39.5  MCV 101.3*  MCH 34.9*  MCHC 34.4  RDW 12.6  PLT 275   Thyroid  Recent Labs  Lab 06/05/22 0430  TSH 0.963    BNP Recent Labs  Lab 06/04/22 0932  BNP 317.8*    DDimer No results for input(s): "DDIMER" in the last 168 hours.   Radiology    CT Angio Chest PE W and/or Wo Contrast  Result Date: 06/04/2022 CLINICAL DATA:  Short of breath beginning approximately  1 week ago after cardiac catheterization. EXAM: CT ANGIOGRAPHY CHEST WITH CONTRAST TECHNIQUE: Multidetector CT imaging of the chest was performed using the standard protocol during bolus administration of intravenous contrast. Multiplanar CT image reconstructions and MIPs were obtained to evaluate the vascular anatomy. RADIATION DOSE REDUCTION: This exam was performed according to the departmental dose-optimization program which includes automated exposure control, adjustment of the mA and/or kV according to patient size and/or use of iterative reconstruction technique. CONTRAST:  23m OMNIPAQUE IOHEXOL 350 MG/ML SOLN COMPARISON:  Current chest radiograph. FINDINGS: Cardiovascular: Pulmonary arteries are well opacified. There is no evidence of a pulmonary embolism. Heart is mildly enlarged. Three-vessel coronary artery calcifications. No pericardial effusion. Aorta is not opacified. Normal caliber great vessels. Mild aortic atherosclerotic calcifications. Mediastinum/Nodes: No neck base, mediastinal or hilar masses. Prominent mediastinal lymph nodes, largest a right paratracheal node 1.2 cm in short axis. Trachea and esophagus are unremarkable. Lungs/Pleura: Small  bilateral pleural effusions. Bilateral interstitial thickening, most evident at the lung bases. Bronchial wall thickening noted in both lower lobes. Mild ground-glass type opacity also noted in the lower lobes and dependent upper lobes. No pneumothorax. Upper Abdomen: No acute abnormality. Musculoskeletal: No fracture or acute finding. No bone lesion. No chest wall mass. Review of the MIP images confirms the above findings. IMPRESSION: 1. No evidence of a pulmonary embolism. 2. Findings consistent with congestive heart failure, with mild cardiomegaly, small effusions, bilateral interstitial thickening as well as hazy dependent lung opacities consistent with edema. Aortic Atherosclerosis (ICD10-I70.0). Electronically Signed   By: DLajean ManesM.D.   On: 06/04/2022 14:00   DG Chest 2 View  Result Date: 06/04/2022 CLINICAL DATA:  Short of breath. EXAM: CHEST - 2 VIEW COMPARISON:  02/17/2022 and prior studies. FINDINGS: Cardiac silhouette is normal in size. No mediastinal or hilar masses. No evidence of adenopathy. Bilateral irregular interstitial thickening, stable from the prior exam. No lung consolidation. No pleural effusion and no pneumothorax. Skeletal structures are demineralized, grossly intact. IMPRESSION: 1. No acute cardiopulmonary disease. 2. Bilateral interstitial thickening suspicious for interstitial lung disease. Consider follow-up high-resolution chest CT for further assessment. Electronically Signed   By: DLajean ManesM.D.   On: 06/04/2022 09:57    Cardiac Studies  Echo this admit pending  Cardiac cath 05/23/22   Ost LM to Dist LM lesion is 10% stenosed.   Prox LAD lesion is 40% stenosed.   There is mild left ventricular systolic dysfunction.   LV end diastolic pressure is moderately elevated.   The left ventricular ejection fraction is 50-55% by visual estimate.   Nonobstructive CAD. The left main and LAD are calcified but no obstructive disease LV function appears low normal  50% Moderately elevated LVEDP 24 mm Hg   Plan: medical management. I would suggest evaluating LV function with Echo.   Echo 04/21/21 IMPRESSIONS     1. Left ventricular ejection fraction, by estimation, is 60 to 65%. The  left ventricle has normal function. The left ventricle has no regional  wall motion abnormalities. Left ventricular diastolic function could not  be evaluated.   2. Right ventricular systolic function is mildly reduced. The right  ventricular size is mildly enlarged. There is mildly elevated pulmonary  artery systolic pressure.   3. Left atrial size was mild to moderately dilated.   4. Right atrial size was mildly dilated.   5. The mitral valve is normal in structure. Mild to moderate mitral valve  regurgitation. No evidence of mitral stenosis.   6. The aortic  valve is calcified. Aortic valve regurgitation is not  visualized. Mild aortic valve sclerosis is present, with no evidence of  aortic valve stenosis.   7. The inferior vena cava is normal in size with greater than 50%  respiratory variability, suggesting right atrial pressure of 3 mmHg.   Comparison(s): 05/25/17 EF 50-55%.   FINDINGS   Left Ventricle: Left ventricular ejection fraction, by estimation, is 60  to 65%. The left ventricle has normal function. The left ventricle has no  regional wall motion abnormalities. The left ventricular internal cavity  size was normal in size. There is   no left ventricular hypertrophy. Left ventricular diastolic function  could not be evaluated due to atrial fibrillation. Left ventricular  diastolic function could not be evaluated.   Right Ventricle: The right ventricular size is mildly enlarged. No  increase in right ventricular wall thickness. Right ventricular systolic  function is mildly reduced. There is mildly elevated pulmonary artery  systolic pressure. The tricuspid regurgitant   velocity is 2.74 m/s, and with an assumed right atrial pressure of 8  mmHg, the  estimated right ventricular systolic pressure is 94.4 mmHg.   Left Atrium: Left atrial size was mild to moderately dilated.   Right Atrium: Right atrial size was mildly dilated.   Pericardium: There is no evidence of pericardial effusion.   Mitral Valve: The mitral valve is normal in structure. Mild to moderate  mitral valve regurgitation. No evidence of mitral valve stenosis.   Tricuspid Valve: The tricuspid valve is normal in structure. Tricuspid  valve regurgitation is mild . No evidence of tricuspid stenosis.   Aortic Valve: The aortic valve is calcified. Aortic valve regurgitation is  not visualized. Mild aortic valve sclerosis is present, with no evidence  of aortic valve stenosis.   Pulmonic Valve: The pulmonic valve was normal in structure. Pulmonic valve  regurgitation is trivial. No evidence of pulmonic stenosis.   Aorta: The aortic root is normal in size and structure.   Venous: The inferior vena cava is normal in size with greater than 50%  respiratory variability, suggesting right atrial pressure of 3 mmHg.   IAS/Shunts: No atrial level shunt detected by color flow Doppler.   Patient Profile     88 y.o. male paroxysmal AF (diagnosed 01/2017, s/p DCCV 2018 and 2021, on amiodarone and apixaban), moderate non obstructive CAD by [facility] 05/23/22 (10% LM, 40% pLAD), HFpEF, HTN and hx mild to mod MR in 2022 who presents with worsening dyspnea on exertion and lower extremity edema.  Assessment & Plan    HFpEF exacerbation dyspnea has increased since cath - IV lasix 40 mg bid neg 997 if correct - start spironolactone 25 mg daily - price check empagliflozin and prescribe on discharge if affordable - repeat TTE with hx of mild to mod MR in 2022 -2V CXR- Bilateral interstitial thickening suspicious for interstitial lung disease. Consider follow-up high-resolution chest CT for further assessment otherwise NAD --CTA of chest --neg PE, Findings consistent with congestive heart  failure, with mild cardiomegaly, small effusions, bilateral interstitial thickening as well as hazy dependent lung opacities consistent with edema. Also Prominent mediastinal lymph nodes, largest a right paratracheal node 1.2 cm in short axis   Troponin elevation - likely in setting of HF exacerbation (102; 204;473) - trend troponin / EKG until peak   CAD-non obstructive this month with cath - not on aspirin since on anticoagulation - order lipid profile and start atorvastatin 20 mg (HDL 52, LDL 83)   Paroxysmal  AF -maintaining SR - CHADS-VASc 5 (age, HTN, HF, CAD) - continue apixaban 5 mg bid - continue amiodarone 200 mg daily               - obtain baseline TSH and LFTs             - recommend outpatient PFTs and outpatient referral to ophtho for baseline eye exam --TSH 0.963   HTN - BP at goal - continue losartan 100 mg daily and amlodipine 10 mg daily - d/c hydralazine - spironolactone 25 mg as above  Hx Bradycardia in 40s in summer with stopping of po dilt and BB' Hx of cardiomyopathy 40% on TEE with atrial fib 2018 back to 60-65% in 2022.       For questions or updates, please contact [place] Please consult [email or web address] for contact info under        Signed, Cecilie Kicks, NP  06/05/2022, 11:16 AM

## 2022-06-05 NOTE — Progress Notes (Signed)
Heart Failure Stewardship Pharmacist Progress Note   PCP: Lavone Orn, MD PCP-Cardiologist: Kirk Ruths, MD    HPI:  88 yo M with PMH of afib, moderate nonobstructive CAD, HFpEF, and HTN.  He presented to the ED on 10/15 with shortness of breath and LE edema. He underwent LHC on 05/23/22 which showed moderate non obstructive CAD and high LVEDP 24 mmHg. Lasix was increased to 40 mg daily. CXR with no acute cardiopulmonary disease. CTA negative for PE, findings consistent with CHF. ECHO on 10/16 showed LVEF 55-60%, no regional wall motion abnormalities, mild LVH, G2DD, RV mildly reduced, trivial MR.  Current HF Medications: Diuretic: furosemide 40 mg IV BID ACE/ARB/ARNI: losartan 100 mg daily MRA: spironolactone 25 mg daily  Prior to admission HF Medications: Diuretic: furosemide 40 mg daily ACE/ARB/ARNI: losartan 100 mg daily Other: hydralazine 25 mg BID  Pertinent Lab Values: Serum creatinine 1.16, BUN 18, Potassium 4.0 (hemolysis), Sodium 138, BNP 317.8  Vital Signs: Weight: last collected 191 lbs Blood pressure: 90-100/50s  Heart rate: 70s  I/O: -1L yesterday  Medication Assistance / Insurance Benefits Check: Does the patient have prescription insurance?  Yes Type of insurance plan: [facility] Medicare  Outpatient Pharmacy:  Prior to admission outpatient pharmacy: [facility] Is the patient willing to use [facility] at discharge? Yes Is the patient willing to transition their outpatient pharmacy to utilize a [facility] outpatient pharmacy?   Pending    Assessment: 1. Acute on chronic diastolic CHF (LVEF 59-45%), due to NICM. NYHA class III symptoms. - Continue furosemide 40 mg IV BID. Strict I/Os and daily weights. Keep K>4. Need to check magnesium with AM labs - Continue losartan 100 mg daily, may need to be reduced pending BP trends - Continue spironolactone 25 mg daily - Consider adding SGLT2i prior to discharge   Plan: 1) Medication changes recommended at this  time: - Check magnesium tomorrow AM - Add Farxiga 10 mg daily  2) Patient assistance: - In the coverage gap (donut hole) - Can complete patient assistance application for [id number] if added  3)  Education  - To be completed prior to discharge  Kerby Nora, PharmD, BCPS Heart Failure Cytogeneticist Phone [phone]

## 2022-06-06 ENCOUNTER — Encounter (HOSPITAL_COMMUNITY): Payer: Self-pay | Admitting: Internal Medicine

## 2022-06-06 DIAGNOSIS — N179 Acute kidney failure, unspecified: Secondary | ICD-10-CM

## 2022-06-06 LAB — BASIC METABOLIC PANEL
Anion gap: 8 (ref 5–15)
BUN: 26 mg/dL — ABNORMAL HIGH (ref 8–23)
CO2: 22 mmol/L (ref 22–32)
Calcium: 8.4 mg/dL — ABNORMAL LOW (ref 8.9–10.3)
Chloride: 105 mmol/L (ref 98–111)
Creatinine, Ser: 1.81 mg/dL — ABNORMAL HIGH (ref 0.61–1.24)
GFR, Estimated: 36 mL/min — ABNORMAL LOW (ref >60)
Glucose, Bld: 100 mg/dL — ABNORMAL HIGH (ref 70–99)
Potassium: 3.6 mmol/L (ref 3.5–5.1)
Sodium: 135 mmol/L (ref 135–145)

## 2022-06-06 LAB — MRSA NEXT GEN BY PCR, NASAL: MRSA by PCR Next Gen: NOT DETECTED

## 2022-06-06 LAB — MAGNESIUM: Magnesium: 1.9 mg/dL (ref 1.7–2.4)

## 2022-06-06 MED ORDER — DIPHENHYDRAMINE HCL 50 MG/ML IJ SOLN
12.5000 mg | Freq: Once | INTRAMUSCULAR | Status: AC
  Administered 2022-06-06: 12.5 mg via INTRAVENOUS
  Filled 2022-06-06: qty 1

## 2022-06-06 MED ORDER — HYDROCORTISONE 1 % EX CREA
TOPICAL_CREAM | Freq: Four times a day (QID) | CUTANEOUS | Status: DC
  Filled 2022-06-06: qty 28

## 2022-06-06 NOTE — Progress Notes (Addendum)
Heart Failure Stewardship Pharmacist Progress Note   PCP: Lavone Orn, MD PCP-Cardiologist: Kirk Ruths, MD    HPI:  88 yo M with PMH of afib, moderate nonobstructive CAD, HFpEF, and HTN.  He presented to the ED on 10/15 with shortness of breath and LE edema. He underwent LHC on 05/23/22 which showed moderate non obstructive CAD and high LVEDP 24 mmHg. Lasix was increased to 40 mg daily. CXR with no acute cardiopulmonary disease. CTA negative for PE, findings consistent with CHF. ECHO on 10/16 showed LVEF 55-60%, no regional wall motion abnormalities, mild LVH, G2DD, RV mildly reduced, trivial MR.  Current HF Medications: Diuretic: furosemide 40 mg IV BID ACE/ARB/ARNI: losartan 100 mg daily MRA: spironolactone 25 mg daily  Prior to admission HF Medications: Diuretic: furosemide 40 mg daily ACE/ARB/ARNI: losartan 100 mg daily Other: hydralazine 25 mg BID  Pertinent Lab Values: Serum creatinine 1.81, BUN 26, Potassium 3.6, Sodium 135, BNP 317.8, Magnesium 1.9  Vital Signs: Weight: 190 lbs Blood pressure: 110/60s  Heart rate: 60-70s  I/O: incomplete  Medication Assistance / Insurance Benefits Check: Does the patient have prescription insurance?  Yes Type of insurance plan: [facility] Medicare  Outpatient Pharmacy:  Prior to admission outpatient pharmacy[facility] Is the patient willing to use [place] at discharge? Yes Is the patient willing to transition their outpatient pharmacy to utilize a [facility] outpatient pharmacy?   Pending    Assessment: 1. Acute on chronic diastolic CHF (LVEF 28-76%), due to NICM. NYHA class III symptoms. - Continue furosemide 40 mg IV BID. Consider transitioning to PO if euvolemic. SCr 1.1>1.8. Strict I/Os and daily weights. Recommend KCl 40 mEq x1 and Magnesium 2g IV x 1. Keep K>4 and Mag>2. - Continue losartan 100 mg daily - Continue spironolactone 25 mg daily - Consider adding SGLT2i prior to discharge, SCr up today   Plan: 1)  Medication changes recommended at this time: - KCl 40 mEq x 1 - Magnesium 2g IV x 1 - Add Farxiga 10 mg daily tomorrow if SCr stable  2) Patient assistance: - In the coverage gap (donut hole) - Does not currently qualify for patient assistance since household income too high. Copays are affordable for patient.  3)  Education  - Patient has been educated on current HF medications and potential additions to HF medication regimen - Patient verbalizes understanding that over the next few months, these medication doses may change and more medications may be added to optimize HF regimen - Patient has been educated on basic disease state pathophysiology and goals of therapy   Kerby Nora, PharmD, BCPS Heart Failure Stewardship Pharmacist Phone [phone]

## 2022-06-06 NOTE — TOC Progression Note (Signed)
Transition of Care [facility]) - Progression Note    Patient Details  Name: Daniel Yu MRN: [id number] Date of Birth: 1932/04/10  Transition of Care [facility]) CM/SW Contact  Zenon Mayo, RN Phone Number: 06/06/2022, 4:01 PM  Clinical Narrative:     from home, acute CHF EX conts on IV lasix.  TOC following.        Expected Discharge Plan and Services                                                 Social Determinants of Health (SDOH) Interventions Food Insecurity Interventions: Intervention Not Indicated Housing Interventions: Intervention Not Indicated Transportation Interventions: Intervention Not Indicated Utilities Interventions: Intervention Not Indicated Alcohol Usage Interventions: Intervention Not Indicated (Score <7) Financial Strain Interventions: Intervention Not Indicated  Readmission Risk Interventions     No data to display

## 2022-06-06 NOTE — Plan of Care (Signed)
  Problem: Activity: Goal: Capacity to carry out activities will improve Outcome: Progressing   Problem: Cardiac: Goal: Ability to achieve and maintain adequate cardiopulmonary perfusion will improve Outcome: Progressing   

## 2022-06-06 NOTE — Progress Notes (Signed)
Heart Failure Nurse Navigator Progress Note  PCP: Lavone Orn, MD PCP-Cardiologist: Stanford Breed Admission Diagnosis: Acute congestive heart failure, Elevated troponins, Dyspnea on exertion. Admitted from: Home  Presentation:   Daniel Yu presented with shortness of breath x 1 week after heart cath 05/23/2022, showed moderate  non obstructive CAD, bilateral lower extremity edema. Lasix was increased not much improvement. IV lasix given. BP 125/73, HR 70, BNP 317.8, Troponin 102. EKG normal sinus rhythm.  Patient and wife were educated on the sign and symptoms of heart failure, daily weights, when to call his doctor or go to the ER. Diet/ fluid restrictions, taking all medications as prescribed and attending all medical appointments.  Patient and wife asked a number of questions and verbalized their understanding of given education. A hospital HF TOC appointment was scheduled for 06/13/2022 '@c10'$  am.   ECHO/ LVEF: 60-65%  G2DD  Clinical Course:  Past Medical History:  Diagnosis Date   Arthritis    right knee oa   Asthma    Carpal tunnel syndrome    BILATERAL   Cholecystitis    Complication of anesthesia    adverse reaction to the spinal   Hypertension    PAF (paroxysmal atrial fibrillation) (HCC)    SBO (small bowel obstruction) ([place]) 12/2015     Social History   Socioeconomic History   Marital status: Married    Spouse name: [name]   Number of children: 2   Years of education: Not on file   Highest education level: Master's degree (e.g., MA, MS, MEng, MEd, MSW, MBA)  Occupational History   Occupation: retired  Tobacco Use   Smoking status: Never   Smokeless tobacco: Never  Vaping Use   Vaping Use: Never used  Substance and Sexual Activity   Alcohol use: Yes    Comment: 1 beer daily   Drug use: No   Sexual activity: Not on file  Other Topics Concern   Not on file  Social History Narrative   Not on file   Social Determinants of Health   Financial Resource  Strain: Low Risk  (06/06/2022)   Overall Financial Resource Strain (CARDIA)    Difficulty of Paying Living Expenses: Not hard at all  Food Insecurity: No Food Insecurity (06/06/2022)   Hunger Vital Sign    Worried About [place] in the Last Year: Never true    Ran Out of Food in the Last Year: Never true  Transportation Needs: No Transportation Needs (06/06/2022)   PRAPARE - Hydrologist (Medical): No    Lack of Transportation (Non-Medical): No  Physical Activity: Not on file  Stress: Not on file  Social Connections: Not on file   Education Assessment and Provision:  Detailed education and instructions provided on heart failure disease management including the following:  Signs and symptoms of Heart Failure When to call the physician Importance of daily weights Low sodium diet Fluid restriction Medication management Anticipated future follow-up appointments  Patient education given on each of the above topics.  Patient acknowledges understanding via teach back method and acceptance of all instructions.  Education Materials:  "Living Better With Heart Failure" Booklet, HF zone tool, & Daily Weight Tracker Tool.  Patient has scale at home: yes Patient has pill box at home: NA    High Risk Criteria for Readmission and/or Poor Patient Outcomes: Heart failure hospital admissions (last 6 months): 2  No Show rate: 0 Difficult social situation: No Demonstrates medication adherence: Yes Primary  Language: English Literacy level: Reading, writing, and comprehension.  Barriers of Care:   Diet/ fluid restrictions Daily weights Continued Hf education  Considerations/Referrals:   Referral made to Heart Failure Pharmacist Stewardship: Yes Referral made to Heart Failure CSW/NCM TOC: NO Referral made to Heart & Vascular TOC clinic: Yes, 06/13/2022 @ 10 am  Items for Follow-up on DC/TOC: Diet/ fluid restrictions Daily weights Continued HF  education   Earnestine Leys, BSN, RN Heart Failure Leisure centre manager Chat Only

## 2022-06-06 NOTE — Progress Notes (Addendum)
Rounding Note    Patient Name: Daniel Yu Date of Encounter: 06/06/2022  [place] Cardiologist: Kirk Ruths, MD   Subjective   Feeling well.  Itching and rash overnight.  Breathing is much better.   Inpatient Medications    Scheduled Meds:  amiodarone  200 mg Oral Daily   amLODipine  10 mg Oral Daily   apixaban  5 mg Oral BID   hydrocortisone cream   Topical QID   loratadine  10 mg Oral Daily   losartan  100 mg Oral Daily   sodium chloride flush  3 mL Intravenous Q12H   spironolactone  25 mg Oral Daily   Continuous Infusions:  sodium chloride     PRN Meds: sodium chloride, acetaminophen, ondansetron (ZOFRAN) IV, sodium chloride flush   Vital Signs    Vitals:   06/06/22 0601 06/06/22 0700 06/06/22 0738 06/06/22 0939  BP:  119/60 117/65 98/63  Pulse:  72 71 77  Resp:  '16 19 18  '$ Temp:   97.6 F (36.4 C)   TempSrc:   Oral   SpO2:  90% 97% 97%  Weight: 86.5 kg     Height:       No intake or output data in the 24 hours ending 06/06/22 1111    06/06/2022    6:01 AM 05/23/2022    7:37 AM 05/19/2022    2:40 PM  Last 3 Weights  Weight (lbs) 190 lb 11.2 oz 191 lb 197 lb 3.2 oz  Weight (kg) 86.5 kg 86.637 kg 89.449 kg      Telemetry    Sinus rhythm.  - Personally Reviewed  ECG    N/a - Personally Reviewed  Physical Exam   VS:  BP 98/63   Pulse 77   Temp 97.6 F (36.4 C) (Oral)   Resp 18   Ht '5\' 10"'$  (1.778 m)   Wt 86.5 kg   SpO2 97%   BMI 27.36 kg/m  , BMI Body mass index is 27.36 kg/m. GENERAL:  Well appearing HEENT: Pupils equal round and reactive, fundi not visualized, oral mucosa unremarkable NECK:  No jugular venous distention, waveform within normal limits, carotid upstroke brisk and symmetric, no bruits, no thyromegaly LUNGS:  Clear to auscultation bilaterally HEART:  RRR.  PMI not displaced or sustained,S1 and S2 within normal limits, no S3, no S4, no clicks, no rubs, no murmurs ABD:  Flat, positive bowel sounds normal  in frequency in pitch, no bruits, no rebound, no guarding, no midline pulsatile mass, no hepatomegaly, no splenomegaly EXT:  2 plus pulses throughout, no edema, no cyanosis no clubbing SKIN:  No rashes no nodules NEURO:  Cranial nerves II through XII grossly intact, motor grossly intact throughout PSYCH:  Cognitively intact, oriented to person place and time   Labs    High Sensitivity Troponin:   Recent Labs  Lab 06/04/22 0932 06/04/22 1143 06/04/22 1539  TROPONINIHS 102* 204* 473*     Chemistry Recent Labs  Lab 06/04/22 0932 06/05/22 0430 06/06/22 0013  NA 138 138 135  K 4.1 4.0 3.6  CL 106 107 105  CO2 23 20* 22  GLUCOSE 112* 109* 100*  BUN 16 18 26*  CREATININE 1.13 1.16 1.81*  CALCIUM 8.8* 7.8* 8.4*  MG  --   --  1.9  PROT  --  5.8*  --   ALBUMIN  --  2.9*  --   AST  --  31  --   ALT  --  17  --   ALKPHOS  --  55  --   BILITOT  --  1.3*  --   GFRNONAA >60 >60 36*  ANIONGAP '9 11 8    '$ Lipids  Recent Labs  Lab 06/05/22 0430  CHOL 147  TRIG 59  HDL 52  LDLCALC 83  CHOLHDL 2.8    Hematology Recent Labs  Lab 06/04/22 0932  WBC 7.3  RBC 3.90*  HGB 13.6  HCT 39.5  MCV 101.3*  MCH 34.9*  MCHC 34.4  RDW 12.6  PLT 275   Thyroid  Recent Labs  Lab 06/05/22 0430  TSH 0.963    BNP Recent Labs  Lab 06/04/22 0932  BNP 317.8*    DDimer No results for input(s): "DDIMER" in the last 168 hours.   Radiology    ECHOCARDIOGRAM COMPLETE  Result Date: 06/05/2022    ECHOCARDIOGRAM REPORT   Patient Name:   Daniel Yu Date of Exam: 06/05/2022 Medical Rec #:  [id number]      Height:       70.0 in Accession #:    [id number]     Weight:       191.0 lb Date of Birth:  1932-02-18       BSA:          2.047 m Patient Age:    88 years       BP:           109/56 mmHg Patient Gender: M              HR:           67 bpm. Exam Location:  Inpatient Procedure: 2D Echo, Cardiac Doppler and Color Doppler Indications:    [place] respiratory distress R06.03  History:         Patient has prior history of Echocardiogram examinations, most                 recent 04/21/2021. CHF, Arrythmias:Atrial Fibrillation; Risk                 Factors:Hypertension.  Sonographer:    Ronny Flurry Referring Phys: [id number] [place]  1. Left ventricular ejection fraction, by estimation, is 55 to 60%. The left ventricle has normal function. The left ventricle has no regional wall motion abnormalities. There is mild concentric left ventricular hypertrophy. Left ventricular diastolic parameters are consistent with Grade II diastolic dysfunction (pseudonormalization).  2. Right ventricular systolic function is mildly reduced. The right ventricular size is mildly enlarged.  3. Left atrial size was mildly dilated.  4. Right atrial size was mildly dilated.  5. The mitral valve is grossly normal. Trivial mitral valve regurgitation.  6. The aortic valve is tricuspid. There is mild calcification of the aortic valve. There is mild thickening of the aortic valve. Aortic valve regurgitation is not visualized. Aortic valve sclerosis/calcification is present, without any evidence of aortic stenosis.  7. Aortic dilatation noted. There is borderline dilatation of the ascending aorta, measuring 38 mm.  8. The inferior vena cava is normal in size with greater than 50% respiratory variability, suggesting right atrial pressure of 3 mmHg. Comparison(s): Prior images reviewed side by side. Compared to prior TTE on 04/2021, the MR appears less on current study. Otherwise there is no significant change. FINDINGS  Left Ventricle: Left ventricular ejection fraction, by estimation, is 55 to 60%. The left ventricle has normal function. The left ventricle has no regional wall motion abnormalities. The left ventricular internal  cavity size was normal in size. There is  mild concentric left ventricular hypertrophy. Left ventricular diastolic parameters are consistent with Grade II diastolic dysfunction  (pseudonormalization). Right Ventricle: The right ventricular size is mildly enlarged. No increase in right ventricular wall thickness. Right ventricular systolic function is mildly reduced. Left Atrium: Left atrial size was mildly dilated. Right Atrium: Right atrial size was mildly dilated. Pericardium: There is no evidence of pericardial effusion. Mitral Valve: The mitral valve is grossly normal. There is mild thickening of the mitral valve leaflet(s). There is mild calcification of the mitral valve leaflet(s). Trivial mitral valve regurgitation. Tricuspid Valve: The tricuspid valve is normal in structure. Tricuspid valve regurgitation is trivial. Aortic Valve: The aortic valve is tricuspid. There is mild calcification of the aortic valve. There is mild thickening of the aortic valve. Aortic valve regurgitation is not visualized. Aortic valve sclerosis/calcification is present, without any evidence of aortic stenosis. Aortic valve mean gradient measures 3.0 mmHg. Aortic valve peak gradient measures 5.9 mmHg. Aortic valve area, by VTI measures 2.50 cm. Pulmonic Valve: The pulmonic valve was normal in structure. Pulmonic valve regurgitation is trivial. Aorta: Aortic dilatation noted. There is borderline dilatation of the ascending aorta, measuring 38 mm. Venous: The inferior vena cava was not well visualized. The inferior vena cava is normal in size with greater than 50% respiratory variability, suggesting right atrial pressure of 3 mmHg. IAS/Shunts: The atrial septum is grossly normal.  LEFT VENTRICLE PLAX 2D LVIDd:         4.60 cm   Diastology LVIDs:         3.30 cm   LV e' medial:    2.81 cm/s LV PW:         1.20 cm   LV E/e' medial:  24.4 LV IVS:        1.30 cm   LV e' lateral:   7.50 cm/s LVOT diam:     2.20 cm   LV E/e' lateral: 9.1 LV SV:         55 LV SV Index:   27 LVOT Area:     3.80 cm  RIGHT VENTRICLE RV S prime:     7.65 cm/s TAPSE (M-mode): 1.3 cm LEFT ATRIUM             Index        RIGHT ATRIUM            Index LA diam:        3.70 cm 1.81 cm/m   RA Area:     24.70 cm LA Vol (A2C):   71.5 ml 34.93 ml/m  RA Volume:   79.10 ml  38.64 ml/m LA Vol (A4C):   65.4 ml 31.95 ml/m LA Biplane Vol: 69.8 ml 34.10 ml/m  AORTIC VALVE AV Area (Vmax):    2.48 cm AV Area (Vmean):   2.86 cm AV Area (VTI):     2.50 cm AV Vmax:           121.00 cm/s AV Vmean:          80.100 cm/s AV VTI:            0.219 m AV Peak Grad:      5.9 mmHg AV Mean Grad:      3.0 mmHg LVOT Vmax:         78.90 cm/s LVOT Vmean:        60.300 cm/s LVOT VTI:          0.144 m LVOT/AV  VTI ratio: 0.66  AORTA Ao Root diam: 3.40 cm Ao Asc diam:  3.80 cm MITRAL VALVE MV Area (PHT): 3.53 cm    SHUNTS MV Decel Time: 215 msec    Systemic VTI:  0.14 m MV E velocity: 68.60 cm/s  Systemic Diam: 2.20 cm MV A velocity: 54.20 cm/s MV E/A ratio:  1.27 Gwyndolyn Kaufman MD Electronically signed by Gwyndolyn Kaufman MD Signature Date/Time: 06/05/2022/12:03:10 PM    Final    CT Angio Chest PE W and/or Wo Contrast  Result Date: 06/04/2022 CLINICAL DATA:  Short of breath beginning approximately 1 week ago after cardiac catheterization. EXAM: CT ANGIOGRAPHY CHEST WITH CONTRAST TECHNIQUE: Multidetector CT imaging of the chest was performed using the standard protocol during bolus administration of intravenous contrast. Multiplanar CT image reconstructions and MIPs were obtained to evaluate the vascular anatomy. RADIATION DOSE REDUCTION: This exam was performed according to the departmental dose-optimization program which includes automated exposure control, adjustment of the mA and/or kV according to patient size and/or use of iterative reconstruction technique. CONTRAST:  59m OMNIPAQUE IOHEXOL 350 MG/ML SOLN COMPARISON:  Current chest radiograph. FINDINGS: Cardiovascular: Pulmonary arteries are well opacified. There is no evidence of a pulmonary embolism. Heart is mildly enlarged. Three-vessel coronary artery calcifications. No pericardial effusion. Aorta is not  opacified. Normal caliber great vessels. Mild aortic atherosclerotic calcifications. Mediastinum/Nodes: No neck base, mediastinal or hilar masses. Prominent mediastinal lymph nodes, largest a right paratracheal node 1.2 cm in short axis. Trachea and esophagus are unremarkable. Lungs/Pleura: Small bilateral pleural effusions. Bilateral interstitial thickening, most evident at the lung bases. Bronchial wall thickening noted in both lower lobes. Mild ground-glass type opacity also noted in the lower lobes and dependent upper lobes. No pneumothorax. Upper Abdomen: No acute abnormality. Musculoskeletal: No fracture or acute finding. No bone lesion. No chest wall mass. Review of the MIP images confirms the above findings. IMPRESSION: 1. No evidence of a pulmonary embolism. 2. Findings consistent with congestive heart failure, with mild cardiomegaly, small effusions, bilateral interstitial thickening as well as hazy dependent lung opacities consistent with edema. Aortic Atherosclerosis (ICD10-I70.0). Electronically Signed   By: DLajean ManesM.D.   On: 06/04/2022 14:00    Cardiac Studies   Echo 06/05/22: 1. Left ventricular ejection fraction, by estimation, is 55 to 60%. The  left ventricle has normal function. The left ventricle has no regional  wall motion abnormalities. There is mild concentric left ventricular  hypertrophy. Left ventricular diastolic  parameters are consistent with Grade II diastolic dysfunction  (pseudonormalization).   2. Right ventricular systolic function is mildly reduced. The right  ventricular size is mildly enlarged.   3. Left atrial size was mildly dilated.   4. Right atrial size was mildly dilated.   5. The mitral valve is grossly normal. Trivial mitral valve  regurgitation.   6. The aortic valve is tricuspid. There is mild calcification of the  aortic valve. There is mild thickening of the aortic valve. Aortic valve  regurgitation is not visualized. Aortic valve  sclerosis/calcification is  present, without any evidence of  aortic stenosis.   7. Aortic dilatation noted. There is borderline dilatation of the  ascending aorta, measuring 38 mm.   8. The inferior vena cava is normal in size with greater than 50%  respiratory variability, suggesting right atrial pressure of 3 mmHg.   LHC 05/23/22:   Ost LM to Dist LM lesion is 10% stenosed.   Prox LAD lesion is 40% stenosed.   There is mild left ventricular  systolic dysfunction.   LV end diastolic pressure is moderately elevated.   The left ventricular ejection fraction is 50-55% by visual estimate.   Nonobstructive CAD. The left main and LAD are calcified but no obstructive disease LV function appears low normal 50% Moderately elevated LVEDP 24 mm Hg   Plan: medical management. I would suggest evaluating LV function with Echo.     Patient Profile     Mr. Ducharme is an 71M with non-obstructive CAD, HTN, HL, PAF, and mitral regurgitation admitted with acute on chronic diastolic heart failure.   Assessment & Plan    #Acute on chronic diastolic heart failure: #Mitral regurgitation: # AKI: Patient was admitted with volume overload.  He is feeling better after diuresis but now with AKI.  Hold lasix, losartan and spironolactone.  May be able to d/c home tomorrow if renal function improvingl. We discussed the importance of weighing himself daily and if his weight goes up by 2 pounds in 1 day or 5 pounds over the course of the week we will need to double his Lasix to 80 mg.  Jardiance/Farxiga once renal function improves.   # Hypertension:  Blood pressures have been labile at home.  Better since he was started on hydralazine but still not quite at goal.  In the hospital spironolactone was added and pressure seems stable.  Continue amlodipine.  Hold losartan, and spironolactone as above 2/2 AKI from overdiuresis. Hydralazine is on hold.   #Nonobstructive CAD: Started atorvastatin 2/2 3v CAD on chest CT  this admission.  Now with new rash.  WIll hold.  Consider lower dose of rosuvasatin once rash resolves.  # PAF:  Maintaining sinus rhythm on amiodarone.  Continue Eliquis.  For questions or updates, please contact [place] Please consult [email or web address] for contact info under        Signed, Skeet Latch, MD  06/06/2022, 11:11 AM

## 2022-06-06 NOTE — Plan of Care (Signed)
  Problem: Education: Goal: Understanding of CV disease, CV risk reduction, and recovery process will improve Outcome: Progressing   Problem: Activity: Goal: Ability to return to baseline activity level will improve Outcome: Progressing   Problem: Cardiovascular: Goal: Ability to achieve and maintain adequate cardiovascular perfusion will improve Outcome: Progressing   Problem: Health Behavior/Discharge Planning: Goal: Ability to safely manage health-related needs after discharge will improve Outcome: Progressing   Problem: Education: Goal: Knowledge of General Education information will improve Description: Including pain rating scale, medication(s)/side effects and non-pharmacologic comfort measures Outcome: Progressing   Problem: Clinical Measurements: Goal: Ability to maintain clinical measurements within normal limits will improve Outcome: Progressing Goal: Will remain free from infection Outcome: Progressing Goal: Cardiovascular complication will be avoided Outcome: Progressing   Problem: Activity: Goal: Risk for activity intolerance will decrease Outcome: Progressing

## 2022-06-07 ENCOUNTER — Other Ambulatory Visit (HOSPITAL_COMMUNITY): Payer: Self-pay

## 2022-06-07 ENCOUNTER — Telehealth (HOSPITAL_COMMUNITY): Payer: Self-pay | Admitting: Pharmacy Technician

## 2022-06-07 DIAGNOSIS — R0609 Other forms of dyspnea: Secondary | ICD-10-CM

## 2022-06-07 LAB — BASIC METABOLIC PANEL
Anion gap: 8 (ref 5–15)
BUN: 29 mg/dL — ABNORMAL HIGH (ref 8–23)
CO2: 24 mmol/L (ref 22–32)
Calcium: 8.3 mg/dL — ABNORMAL LOW (ref 8.9–10.3)
Chloride: 105 mmol/L (ref 98–111)
Creatinine, Ser: 1.44 mg/dL — ABNORMAL HIGH (ref 0.61–1.24)
GFR, Estimated: 47 mL/min — ABNORMAL LOW (ref >60)
Glucose, Bld: 91 mg/dL (ref 70–99)
Potassium: 3.7 mmol/L (ref 3.5–5.1)
Sodium: 137 mmol/L (ref 135–145)

## 2022-06-07 MED ORDER — FUROSEMIDE 40 MG PO TABS: 40.0000 mg | ORAL_TABLET | Freq: Every day | ORAL | Status: DC

## 2022-06-07 MED ORDER — AMLODIPINE BESYLATE 5 MG PO TABS
5.0000 mg | ORAL_TABLET | Freq: Every day | ORAL | 3 refills | Status: DC
  Filled 2022-06-07: qty 90, 90d supply, fill #0

## 2022-06-07 MED ORDER — HYDROCORTISONE 1 % EX CREA
TOPICAL_CREAM | Freq: Four times a day (QID) | CUTANEOUS | 0 refills | Status: DC
  Filled 2022-06-07: qty 28, 28d supply, fill #0

## 2022-06-07 MED ORDER — DAPAGLIFLOZIN PROPANEDIOL 10 MG PO TABS
10.0000 mg | ORAL_TABLET | Freq: Every day | ORAL | 11 refills | Status: DC
  Filled 2022-06-07: qty 30, 30d supply, fill #0

## 2022-06-07 MED ORDER — DAPAGLIFLOZIN PROPANEDIOL 10 MG PO TABS
10.0000 mg | ORAL_TABLET | Freq: Every day | ORAL | Status: DC
  Administered 2022-06-07: 10 mg via ORAL
  Filled 2022-06-07: qty 1

## 2022-06-07 MED ORDER — AMLODIPINE BESYLATE 5 MG PO TABS: 5.0000 mg | ORAL_TABLET | Freq: Every day | ORAL | Status: DC

## 2022-06-07 MED ORDER — SPIRONOLACTONE 25 MG PO TABS
25.0000 mg | ORAL_TABLET | Freq: Every day | ORAL | 3 refills | Status: DC
  Filled 2022-06-07: qty 90, 90d supply, fill #0

## 2022-06-07 NOTE — Telephone Encounter (Signed)
Pharmacy Patient Advocate Encounter  Insurance verification completed.    The patient is insured through [employer] Medicare Part D   The patient is currently admitted and ran test claims for the following: Farxiga, Jardiance.  Copays and coinsurance results were relayed to Inpatient clinical team.  

## 2022-06-07 NOTE — TOC Transition Note (Signed)
Transition of Care [facility]) - CM/SW Discharge Note   Patient Details  Name: Daniel Yu MRN: [id number] Date of Birth: 1932/08/15  Transition of Care [facility]) CM/SW Contact:  Zenon Mayo, RN Phone Number: 06/07/2022, 11:56 AM   Clinical Narrative:     For dc today, he has no needs.        Patient Goals and CMS Choice        Discharge Placement                       Discharge Plan and Services                                     Social Determinants of Health (SDOH) Interventions Food Insecurity Interventions: Intervention Not Indicated Housing Interventions: Intervention Not Indicated Transportation Interventions: Intervention Not Indicated Utilities Interventions: Intervention Not Indicated Alcohol Usage Interventions: Intervention Not Indicated (Score <7) Financial Strain Interventions: Intervention Not Indicated   Readmission Risk Interventions     No data to display

## 2022-06-07 NOTE — Discharge Summary (Addendum)
Discharge Summary    Patient ID: Daniel Yu MRN: [id number]; DOB: Sep 04, 1931  Admit date: 06/04/2022 Discharge date: 06/07/2022  PCP:  Lavone Orn, MD   [facility] Cardiologist:  Kirk Ruths, MD     Discharge Diagnoses    Principal Problem:   Acute exacerbation of CHF (congestive heart failure) [facility]) Active Problems:   Essential hypertension   Elevated troponin   AKI (acute kidney injury) ([place])   DOE (dyspnea on exertion)    Diagnostic Studies/Procedures    Echocardiogram 06/05/22 1. Left ventricular ejection fraction, by estimation, is 55 to 60%. The  left ventricle has normal function. The left ventricle has no regional  wall motion abnormalities. There is mild concentric left ventricular  hypertrophy. Left ventricular diastolic  parameters are consistent with Grade II diastolic dysfunction  (pseudonormalization).   2. Right ventricular systolic function is mildly reduced. The right  ventricular size is mildly enlarged.   3. Left atrial size was mildly dilated.   4. Right atrial size was mildly dilated.   5. The mitral valve is grossly normal. Trivial mitral valve  regurgitation.   6. The aortic valve is tricuspid. There is mild calcification of the  aortic valve. There is mild thickening of the aortic valve. Aortic valve  regurgitation is not visualized. Aortic valve sclerosis/calcification is  present, without any evidence of  aortic stenosis.   7. Aortic dilatation noted. There is borderline dilatation of the  ascending aorta, measuring 38 mm.   8. The inferior vena cava is normal in size with greater than 50%  respiratory variability, suggesting right atrial pressure of 3 mmHg.   Comparison(s): Prior images reviewed side by side. Compared to prior TTE  on 04/2021, the MR appears less on current study. Otherwise there is no  significant change.   LHC 05/23/22:   Ost LM to Dist LM lesion is 10% stenosed.   Prox LAD lesion is  40% stenosed.   There is mild left ventricular systolic dysfunction.   LV end diastolic pressure is moderately elevated.   The left ventricular ejection fraction is 50-55% by visual estimate.   Nonobstructive CAD. The left main and LAD are calcified but no obstructive disease LV function appears low normal 50% Moderately elevated LVEDP 24 mm Hg   Plan: medical management. I would suggest evaluating LV function with Echo.  _____________   History of Present Illness     Daniel Yu is a 88 y.o. male with history of paroxysmal atrial fibrillation (diagnosed 01/2017, s/p DCCV 2018 and 2021, now on amiodarone and Eliquis), moderate nonobstructive CAD by left heart catheterization on 05/23/2022 (10% left main, 40% proximal LAD), HFpEF, HTN. Patient presented on 10/15 with worsening dypsnea on exertion and lower extremity edema.   4 months ago, patient was seen in the ED for similar symptoms and was diagnosed with a CHF exacerbation.  Patient received Lasix and was discharged on p.o. Lasix 20 mg daily.  His metoprolol and diltiazem were discontinued due to bradycardia and lightheadedness.  Patient followed up in the cardiology clinic, underwent a cardiac PET scan on 05/16/2022.  PET scan findings were consistent with ischemia and the study was high risk.  Because of these findings, patient underwent cardiac catheterization on 05/23/2022.  Cath showed nonobstructive CAD with 10% stenosis in LM, 40% stenosis in proximal LAD.  Also showed a high LVEDP of 24 mmHg.  Lasix was increased to 40 mg daily.  Over the past 2 weeks, patient noticed worsening  dyspnea on exertion.  Also complained of mild lower extremity edema, mild dry cough.  He denied orthopnea, palpitations, lightheadedness, dizziness, falls, chest pain, fever, chills, sick contacts.  He did report compliance with his medications.In ED he underwent CT PE which was negative for embolus but did show signs of pulmonary edema. BNP elevated at 317,  similar to his ED admission in June at which time it was 363. Troponin 102 - 204 - 473. EKG NSR. Normal Hb. Cr slightly elevated at 1.13 from normal baseline. Covid / flu negative  Hospital Course     Consultants: None  Acute on chronic diastolic heart failure Mitral valve regurgitation AKI, clinically significant  -Patient was admitted with volume overload.  He was diuresed with IV Lasix with improvement in symptoms - Unfortunately, patient did develop AKI due to overdiuresis on 10/17 with creatinine reaching 1.81 (up from 1.16 the day prior)  Lasix, spironolactone, losartan were all held. - Creatinine improved to 1.44 on 10/18 prior to DC  - Continue losartan 100 mg daily - Resume home Lasix 40 mg daily tomorrow. Discussed the importance of weighing himself daily, and if his weight goes up by 2 pounds in 1 day or 5 pounds over the course of the week we will need to double his Lasix to 80 mg - Started spironolactone 25 mg daily, Farxiga 10 mg daily this admission  - Patient needs a BMP in 1 week-- will be drawn at [facility] TOC appointment on 10/24 - Patient has a follow up appointment with general cardiology on 10/27  HTN  - Note that BP was labile at home prior to admission. Start spironolactone this admission and BP seemed stable.  - Decrease amlodipine to 5 mg daily  - Continue losartan 100 mg daily, spironolactone 25 mg daily   Nonobstructive CAD  - Cath on 10/3 showed nonobstructive disease with 10% stenosis in LM, 40% stenosis in proximal LAD  - Attempted to start atorvastatin this admission, but patient developed a rash - Will hold off on statin for now until rash resolves - Can attempt a lower dose of crestor at outpatient follow up  Paroxysmal Atrial Fibrillation  - Patient is maintaining normal sinus rhythm  - Continue amiodarone 200 mg daily  - Continue eliquis 5 mg BID   Patient was seen and examined by Dr. Oval Linsey and deemed stable for discharge   Did the  patient have an acute coronary syndrome (MI, NSTEMI, STEMI, etc) this admission?:  No                               Did the patient have a percutaneous coronary intervention (stent / angioplasty)?:  No.          _____________  Discharge Vitals Blood pressure 99/62, pulse 66, temperature 97.9 F (36.6 C), resp. rate 19, height '5\' 10"'$  (1.778 m), weight 86.7 kg, SpO2 95 %.  Filed Weights   06/06/22 0601 06/07/22 0427  Weight: 86.5 kg 86.7 kg    Labs & Radiologic Studies    CBC No results for input(s): "WBC", "NEUTROABS", "HGB", "HCT", "MCV", "PLT" in the last 72 hours. Basic Metabolic Panel Recent Labs    06/06/22 0013 06/07/22 0015  NA 135 137  K 3.6 3.7  CL 105 105  CO2 22 24  GLUCOSE 100* 91  BUN 26* 29*  CREATININE 1.81* 1.44*  CALCIUM 8.4* 8.3*  MG 1.9  --  Liver Function Tests Recent Labs    06/05/22 0430  AST 31  ALT 17  ALKPHOS 55  BILITOT 1.3*  PROT 5.8*  ALBUMIN 2.9*   No results for input(s): "LIPASE", "AMYLASE" in the last 72 hours. High Sensitivity Troponin:   Recent Labs  Lab 06/04/22 0932 06/04/22 1143 06/04/22 1539  TROPONINIHS 102* 204* 473*    BNP Invalid input(s): "POCBNP" D-Dimer No results for input(s): "DDIMER" in the last 72 hours. Hemoglobin A1C No results for input(s): "HGBA1C" in the last 72 hours. Fasting Lipid Panel Recent Labs    06/05/22 0430  CHOL 147  HDL 52  LDLCALC 83  TRIG 59  CHOLHDL 2.8   Thyroid Function Tests Recent Labs    06/05/22 0430  TSH 0.963   _____________  ECHOCARDIOGRAM COMPLETE  Result Date: 06/05/2022    ECHOCARDIOGRAM REPORT   Patient Name:   Radley PRIDE GONZALES Date of Exam: 06/05/2022 Medical Rec #:  [id number]      Height:       70.0 in Accession #:    [id number]     Weight:       191.0 lb Date of Birth:  1931-11-03       BSA:          2.047 m Patient Age:    12 years       BP:           109/56 mmHg Patient Gender: M              HR:           67 bpm. Exam Location:  Inpatient Procedure: 2D  Echo, Cardiac Doppler and Color Doppler Indications:    [place] respiratory distress R06.03  History:        Patient has prior history of Echocardiogram examinations, most                 recent 04/21/2021. CHF, Arrythmias:Atrial Fibrillation; Risk                 Factors:Hypertension.  Sonographer:    Ronny Flurry Referring Phys: [id number] [place]  1. Left ventricular ejection fraction, by estimation, is 55 to 60%. The left ventricle has normal function. The left ventricle has no regional wall motion abnormalities. There is mild concentric left ventricular hypertrophy. Left ventricular diastolic parameters are consistent with Grade II diastolic dysfunction (pseudonormalization).  2. Right ventricular systolic function is mildly reduced. The right ventricular size is mildly enlarged.  3. Left atrial size was mildly dilated.  4. Right atrial size was mildly dilated.  5. The mitral valve is grossly normal. Trivial mitral valve regurgitation.  6. The aortic valve is tricuspid. There is mild calcification of the aortic valve. There is mild thickening of the aortic valve. Aortic valve regurgitation is not visualized. Aortic valve sclerosis/calcification is present, without any evidence of aortic stenosis.  7. Aortic dilatation noted. There is borderline dilatation of the ascending aorta, measuring 38 mm.  8. The inferior vena cava is normal in size with greater than 50% respiratory variability, suggesting right atrial pressure of 3 mmHg. Comparison(s): Prior images reviewed side by side. Compared to prior TTE on 04/2021, the MR appears less on current study. Otherwise there is no significant change. FINDINGS  Left Ventricle: Left ventricular ejection fraction, by estimation, is 55 to 60%. The left ventricle has normal function. The left ventricle has no regional wall motion abnormalities. The left ventricular internal cavity size was normal in size.  There is  mild concentric left ventricular hypertrophy.  Left ventricular diastolic parameters are consistent with Grade II diastolic dysfunction (pseudonormalization). Right Ventricle: The right ventricular size is mildly enlarged. No increase in right ventricular wall thickness. Right ventricular systolic function is mildly reduced. Left Atrium: Left atrial size was mildly dilated. Right Atrium: Right atrial size was mildly dilated. Pericardium: There is no evidence of pericardial effusion. Mitral Valve: The mitral valve is grossly normal. There is mild thickening of the mitral valve leaflet(s). There is mild calcification of the mitral valve leaflet(s). Trivial mitral valve regurgitation. Tricuspid Valve: The tricuspid valve is normal in structure. Tricuspid valve regurgitation is trivial. Aortic Valve: The aortic valve is tricuspid. There is mild calcification of the aortic valve. There is mild thickening of the aortic valve. Aortic valve regurgitation is not visualized. Aortic valve sclerosis/calcification is present, without any evidence of aortic stenosis. Aortic valve mean gradient measures 3.0 mmHg. Aortic valve peak gradient measures 5.9 mmHg. Aortic valve area, by VTI measures 2.50 cm. Pulmonic Valve: The pulmonic valve was normal in structure. Pulmonic valve regurgitation is trivial. Aorta: Aortic dilatation noted. There is borderline dilatation of the ascending aorta, measuring 38 mm. Venous: The inferior vena cava was not well visualized. The inferior vena cava is normal in size with greater than 50% respiratory variability, suggesting right atrial pressure of 3 mmHg. IAS/Shunts: The atrial septum is grossly normal.  LEFT VENTRICLE PLAX 2D LVIDd:         4.60 cm   Diastology LVIDs:         3.30 cm   LV e' medial:    2.81 cm/s LV PW:         1.20 cm   LV E/e' medial:  24.4 LV IVS:        1.30 cm   LV e' lateral:   7.50 cm/s LVOT diam:     2.20 cm   LV E/e' lateral: 9.1 LV SV:         55 LV SV Index:   27 LVOT Area:     3.80 cm  RIGHT VENTRICLE RV S prime:      7.65 cm/s TAPSE (M-mode): 1.3 cm LEFT ATRIUM             Index        RIGHT ATRIUM           Index LA diam:        3.70 cm 1.81 cm/m   RA Area:     24.70 cm LA Vol (A2C):   71.5 ml 34.93 ml/m  RA Volume:   79.10 ml  38.64 ml/m LA Vol (A4C):   65.4 ml 31.95 ml/m LA Biplane Vol: 69.8 ml 34.10 ml/m  AORTIC VALVE AV Area (Vmax):    2.48 cm AV Area (Vmean):   2.86 cm AV Area (VTI):     2.50 cm AV Vmax:           121.00 cm/s AV Vmean:          80.100 cm/s AV VTI:            0.219 m AV Peak Grad:      5.9 mmHg AV Mean Grad:      3.0 mmHg LVOT Vmax:         78.90 cm/s LVOT Vmean:        60.300 cm/s LVOT VTI:          0.144 m LVOT/AV VTI ratio: 0.66  AORTA Ao  Root diam: 3.40 cm Ao Asc diam:  3.80 cm MITRAL VALVE MV Area (PHT): 3.53 cm    SHUNTS MV Decel Time: 215 msec    Systemic VTI:  0.14 m MV E velocity: 68.60 cm/s  Systemic Diam: 2.20 cm MV A velocity: 54.20 cm/s MV E/A ratio:  1.27 Gwyndolyn Kaufman MD Electronically signed by Gwyndolyn Kaufman MD Signature Date/Time: 06/05/2022/12:03:10 PM    Final    CT Angio Chest PE W and/or Wo Contrast  Result Date: 06/04/2022 CLINICAL DATA:  Short of breath beginning approximately 1 week ago after cardiac catheterization. EXAM: CT ANGIOGRAPHY CHEST WITH CONTRAST TECHNIQUE: Multidetector CT imaging of the chest was performed using the standard protocol during bolus administration of intravenous contrast. Multiplanar CT image reconstructions and MIPs were obtained to evaluate the vascular anatomy. RADIATION DOSE REDUCTION: This exam was performed according to the departmental dose-optimization program which includes automated exposure control, adjustment of the mA and/or kV according to patient size and/or use of iterative reconstruction technique. CONTRAST:  58m OMNIPAQUE IOHEXOL 350 MG/ML SOLN COMPARISON:  Current chest radiograph. FINDINGS: Cardiovascular: Pulmonary arteries are well opacified. There is no evidence of a pulmonary embolism. Heart is mildly  enlarged. Three-vessel coronary artery calcifications. No pericardial effusion. Aorta is not opacified. Normal caliber great vessels. Mild aortic atherosclerotic calcifications. Mediastinum/Nodes: No neck base, mediastinal or hilar masses. Prominent mediastinal lymph nodes, largest a right paratracheal node 1.2 cm in short axis. Trachea and esophagus are unremarkable. Lungs/Pleura: Small bilateral pleural effusions. Bilateral interstitial thickening, most evident at the lung bases. Bronchial wall thickening noted in both lower lobes. Mild ground-glass type opacity also noted in the lower lobes and dependent upper lobes. No pneumothorax. Upper Abdomen: No acute abnormality. Musculoskeletal: No fracture or acute finding. No bone lesion. No chest wall mass. Review of the MIP images confirms the above findings. IMPRESSION: 1. No evidence of a pulmonary embolism. 2. Findings consistent with congestive heart failure, with mild cardiomegaly, small effusions, bilateral interstitial thickening as well as hazy dependent lung opacities consistent with edema. Aortic Atherosclerosis (ICD10-I70.0). Electronically Signed   By: DLajean ManesM.D.   On: 06/04/2022 14:00   DG Chest 2 View  Result Date: 06/04/2022 CLINICAL DATA:  Short of breath. EXAM: CHEST - 2 VIEW COMPARISON:  02/17/2022 and prior studies. FINDINGS: Cardiac silhouette is normal in size. No mediastinal or hilar masses. No evidence of adenopathy. Bilateral irregular interstitial thickening, stable from the prior exam. No lung consolidation. No pleural effusion and no pneumothorax. Skeletal structures are demineralized, grossly intact. IMPRESSION: 1. No acute cardiopulmonary disease. 2. Bilateral interstitial thickening suspicious for interstitial lung disease. Consider follow-up high-resolution chest CT for further assessment. Electronically Signed   By: DLajean ManesM.D.   On: 06/04/2022 09:57   CARDIAC CATHETERIZATION  Result Date: 05/23/2022   Ost LM to  Dist LM lesion is 10% stenosed.   Prox LAD lesion is 40% stenosed.   There is mild left ventricular systolic dysfunction.   LV end diastolic pressure is moderately elevated.   The left ventricular ejection fraction is 50-55% by visual estimate. Nonobstructive CAD. The left main and LAD are calcified but no obstructive disease LV function appears low normal 50% Moderately elevated LVEDP 24 mm Hg Plan: medical management. I would suggest evaluating LV function with Echo.   NM PET CT CARDIAC PERFUSION MULTI W/ABSOLUTE BLOODFLOW  Result Date: 05/17/2022   Findings are consistent with ischemia. The study is high risk.  Perfusion images are normal.  However,findings are concerning for  balanced ischemia given TID is present, LVEF decreases during stress (from 42% to 34%), and there are severe coronary calcifications.  In addition, flows are markedly abnormal, with almost no increase in flows from rest to stress globally and in each coronary distribution.  While this could represent nonresponse to vasodilator, given TID and drop in EF with stress this is concerning for severe multivessel disease.  Recommend cardiac catheterization.   LV perfusion is normal.   Rest left ventricular function is abnormal. Rest global function is mildly reduced. Rest EF: 42 %. Stress left ventricular function is abnormal. Stress global function is moderately reduced. Stress EF: 34 %.   Coronary calcium was present on the attenuation correction CT images. Severe coronary calcifications were present. Coronary calcifications were present in the left anterior descending artery, left circumflex artery and right coronary artery distribution(s).   Electronically signed by Oswaldo Milian, MD CLINICAL DATA:  This over-read does not include interpretation of cardiac or coronary anatomy or pathology. The Cardiac PET CT interpretation by the cardiologist is attached. COMPARISON:  None available FINDINGS: Vascular: Please see dedicated report for  cardiovascular details. There is calcified coronary artery disease and aortic atherosclerosis. Mediastinum/Nodes: No adenopathy or acute process in the mediastinum. Lungs/Pleura: Mild subpleural reticulation and signs of basilar atelectasis. No consolidation. No pleural effusion. Upper Abdomen: Incidental imaging of upper abdominal contents without acute process. Musculoskeletal: No acute or destructive bone findings. Spinal degenerative changes. IMPRESSION: No acute or significant extracardiac findings. Aortic atherosclerosis and coronary artery disease. Aortic Atherosclerosis (ICD10-I70.0). Electronically Signed   By: Zetta Bills M.D.   On: 05/16/2022 10:36  Disposition   Pt is being discharged home today in good condition.  Follow-up Plans & Appointments     Follow-up Information     [facility]. Go in 5 day(s).   Specialty: Cardiology Why: Hospital follow up PLEASE bring a current medication list to appointment FREE valet parking, Entrance C, off [place] information: [address] [id number] [place]        Kathalene Frames, MD Follow up on 06/21/2022.   Specialty: Internal Medicine Why: 2:30 for hospital followup Contact information: [address], [address] [place] [address] [phone]         Lendon Colonel, NP Follow up on 06/16/2022.   Specialties: Nurse Practitioner, Radiology, Cardiology Why: Appointment at 8:25 AM Contact information: [address] [place] [address] [phone]                Discharge Instructions     Diet - low sodium heart healthy   Complete by: As directed    Discharge instructions   Complete by: As directed    Resume lasix (furosemide) tomorrow 10/19   Increase activity slowly   Complete by: As directed         Discharge Medications   Allergies as of 06/07/2022        Reactions   Zestril [lisinopril] Other (See Comments)   Unknown reaction type   Bee Venom Swelling, Rash        Medication List     STOP taking these medications    Fluad Quadrivalent 0.5 ML injection Generic drug: influenza vaccine adjuvanted   hydrALAZINE 25 MG tablet Commonly known as: APRESOLINE   Pfizer COVID-19 Vac Bivalent injection Generic drug: COVID-19 mRNA bivalent vaccine Therapist, music)   Pfizer-BioNT COVID-19 Vac-TriS Susp injection Generic drug: COVID-19 mRNA Vac-TriS [employer])  TAKE these medications    amiodarone 200 MG tablet Commonly known as: PACERONE Take 1 tablet (200 mg total) by mouth daily.   amLODipine 5 MG tablet Commonly known as: NORVASC Take 1 tablet (5 mg total) by mouth daily. Start taking on: June 08, 2022 What changed:  medication strength how much to take   cetirizine 10 MG tablet Commonly known as: ZYRTEC Take 10 mg by mouth in the morning.   dapagliflozin propanediol 10 MG Tabs tablet Commonly known as: FARXIGA Take 1 tablet (10 mg total) by mouth daily. Start taking on: June 08, 2022   Eliquis 5 MG Tabs tablet Generic drug: apixaban TAKE 1 TABLET(5 MG) BY MOUTH TWICE DAILY What changed: See the new instructions.   fluticasone 110 MCG/ACT inhaler Commonly known as: FLOVENT HFA Inhale 1 puff into the lungs 2 (two) times daily.   furosemide 20 MG tablet Commonly known as: LASIX Take 2 tablets (40 mg total) by mouth daily.   hydrocortisone cream 1 % Apply topically 4 (four) times daily.   losartan 100 MG tablet Commonly known as: COZAAR Take 1 tablet (100 mg total) by mouth daily.   spironolactone 25 MG tablet Commonly known as: ALDACTONE Take 1 tablet (25 mg total) by mouth daily. Start taking on: June 08, 2022   vitamin C 1000 MG tablet Take 1,000 mg by mouth in the morning.           Outstanding Labs/Studies   BMP in 1 week   Duration of Discharge Encounter   Greater than 30 minutes  including physician time.  Signed, Margie Billet, PA-C 06/07/2022, 11:26 AM

## 2022-06-07 NOTE — TOC Benefit Eligibility Note (Signed)
Patient Teacher, English as a foreign language completed.    The patient is currently admitted and upon discharge could be taking Jardiance 10 mg.  The current 30 day co-pay is $158.66 due to being in Coverage Gap (donut hole).   The patient is currently admitted and upon discharge could be taking Farxiga 10 mg due to being in Coverage Gap (donut hole).  The current 30 day co-pay is $151.18.   The patient is insured through [place] [place] Patient Advocate Specialist [place] Patient Advocate Team Direct Number: [phone]  Fax: [phone]

## 2022-06-07 NOTE — Progress Notes (Signed)
Mobility Specialist Progress Note    06/07/22 0917  Mobility  Activity Ambulated independently in hallway  Level of Assistance Standby assist, set-up cues, supervision of patient - no hands on  Assistive Device None  Distance Ambulated (ft) 420 ft  Activity Response Tolerated well  Mobility Referral Yes  $Mobility charge 1 Mobility   Pre-Mobility: 66 HR, 97% SpO2 During Mobility: 92 HR Post-Mobility: 80 HR  Pt received sitting EOB and agreeable. No complaints on walk. Returned to sitting EOB with call bell in reach.    Hildred Alamin Mobility Specialist  Secure Chat Only

## 2022-06-07 NOTE — Care Management Important Message (Signed)
Important Message  Patient Details  Name: Daniel Yu MRN: [id number] Date of Birth: 1931/11/16   Medicare Important Message Given:  Yes Patient left prior to IM delivery will mail to the patient home address.     [name] 06/07/2022, 2:19 PM

## 2022-06-07 NOTE — Discharge Instructions (Signed)

## 2022-06-07 NOTE — Care Management Important Message (Signed)
Important Message  Patient Details  Name: Daniel Yu MRN: [id number] Date of Birth: 1931/12/22   Medicare Important Message Given:  Yes     Orbie Pyo 06/07/2022, 2:11 PM

## 2022-06-07 NOTE — Care Management Important Message (Signed)
Important Message  Patient Details  Name: Daniel Yu MRN: [id number] Date of Birth: [birth year]   Medicare Important Message Given:  Yes     Azhar Yogi 06/07/2022, 2:16 PM

## 2022-06-07 NOTE — Progress Notes (Signed)
Heart Failure Stewardship Pharmacist Progress Note   PCP: Lavone Orn, MD PCP-Cardiologist: Kirk Ruths, MD    HPI:  88 yo M with PMH of afib, moderate nonobstructive CAD, HFpEF, and HTN.  He presented to the ED on 10/15 with shortness of breath and LE edema. He underwent LHC on 05/23/22 which showed moderate non obstructive CAD and high LVEDP 24 mmHg. Lasix was increased to 40 mg daily. CXR with no acute cardiopulmonary disease. CTA negative for PE, findings consistent with CHF. ECHO on 10/16 showed LVEF 55-60%, no regional wall motion abnormalities, mild LVH, G2DD, RV mildly reduced, trivial MR.  Current HF Medications: ACE/ARB/ARNI: losartan 100 mg daily MRA: spironolactone 25 mg daily  Prior to admission HF Medications: Diuretic: furosemide 40 mg daily ACE/ARB/ARNI: losartan 100 mg daily Other: hydralazine 25 mg BID  Pertinent Lab Values: Serum creatinine 1.44, BUN 29, Potassium 3.7, Sodium 137, BNP 317.8, Magnesium 1.9  Vital Signs: Weight: 191 lbs Blood pressure: 100/60s  Heart rate: 60-70s  I/O: incomplete  Medication Assistance / Insurance Benefits Check: Does the patient have prescription insurance?  Yes Type of insurance plan: [facility] Medicare  Outpatient Pharmacy:  Prior to admission outpatient pharmacy: [facility] Is the patient willing to use [facility] at discharge? Yes Is the patient willing to transition their outpatient pharmacy to utilize a [facility] outpatient pharmacy?   Pending    Assessment: 1. Acute on chronic diastolic CHF (LVEF 17-49%), due to NICM. NYHA class II symptoms. - Off lasix. SCr improved today. Strict I/Os and daily weights. Keep K>4 and Mag>2. - Continue losartan 100 mg daily - Continue spironolactone 25 mg daily - Consider adding Farxiga 10 mg daily today - BP on the lower side, consider reducing amlodipine to 5 mg daily   Plan: 1) Medication changes recommended at this time: - Start Farixga 10 mg daily - Reduce amlodipine  to 5 mg  2) Patient assistance: - In the coverage gap (donut hole) - Does not currently qualify for patient assistance since household income too high. Copays are affordable for patient.  3)  Education  - Patient has been educated on current HF medications and potential additions to HF medication regimen - Patient verbalizes understanding that over the next few months, these medication doses may change and more medications may be added to optimize HF regimen - Patient has been educated on basic disease state pathophysiology and goals of therapy   Kerby Nora, PharmD, BCPS Heart Failure Stewardship Pharmacist Phone [phone]

## 2022-06-07 NOTE — Progress Notes (Signed)
Rounding Note    Patient Name: Daniel Yu Date of Encounter: 06/07/2022  [place] Cardiologist: Kirk Ruths, MD   Subjective   Feeling well.  Eager to go home.  Appreciates the staff.   Inpatient Medications    Scheduled Meds:  amiodarone  200 mg Oral Daily   amLODipine  10 mg Oral Daily   apixaban  5 mg Oral BID   hydrocortisone cream   Topical QID   loratadine  10 mg Oral Daily   losartan  100 mg Oral Daily   sodium chloride flush  3 mL Intravenous Q12H   spironolactone  25 mg Oral Daily   Continuous Infusions:  sodium chloride     PRN Meds: sodium chloride, acetaminophen, ondansetron (ZOFRAN) IV, sodium chloride flush   Vital Signs    Vitals:   06/07/22 0414 06/07/22 0427 06/07/22 0720 06/07/22 0735  BP: 102/61  (!) 86/64 99/62  Pulse: 62  63 66  Resp: '20  20 19  '$ Temp: 97.7 F (36.5 C)  97.9 F (36.6 C)   TempSrc: Oral     SpO2: 98%  96% 95%  Weight:  86.7 kg    Height:        Intake/Output Summary (Last 24 hours) at 06/07/2022 1021 Last data filed at 06/07/2022 0639 Gross per 24 hour  Intake 480 ml  Output --  Net 480 ml      06/07/2022    4:27 AM 06/06/2022    6:01 AM 05/23/2022    7:37 AM  Last 3 Weights  Weight (lbs) 191 lb 2.2 oz 190 lb 11.2 oz 191 lb  Weight (kg) 86.7 kg 86.5 kg 86.637 kg      Telemetry    Sinus rhythm.  - Personally Reviewed  ECG    N/a - Personally Reviewed  Physical Exam   VS:  BP 99/62   Pulse 66   Temp 97.9 F (36.6 C)   Resp 19   Ht '5\' 10"'$  (1.778 m)   Wt 86.7 kg   SpO2 95%   BMI 27.43 kg/m  , BMI Body mass index is 27.43 kg/m. GENERAL:  Well appearing HEENT: Pupils equal round and reactive, fundi not visualized, oral mucosa unremarkable NECK:  No jugular venous distention, waveform within normal limits, carotid upstroke brisk and symmetric, no bruits, no thyromegaly LUNGS:  Clear to auscultation bilaterally HEART:  RRR.  PMI not displaced or sustained,S1 and S2 within normal  limits, no S3, no S4, no clicks, no rubs, no murmurs ABD:  Flat, positive bowel sounds normal in frequency in pitch, no bruits, no rebound, no guarding, no midline pulsatile mass, no hepatomegaly, no splenomegaly EXT:  2 plus pulses throughout, no edema, no cyanosis no clubbing SKIN:  No rashes no nodules NEURO:  Cranial nerves II through XII grossly intact, motor grossly intact throughout PSYCH:  Cognitively intact, oriented to person place and time   Labs    High Sensitivity Troponin:   Recent Labs  Lab 06/04/22 0932 06/04/22 1143 06/04/22 1539  TROPONINIHS 102* 204* 473*     Chemistry Recent Labs  Lab 06/05/22 0430 06/06/22 0013 06/07/22 0015  NA 138 135 137  K 4.0 3.6 3.7  CL 107 105 105  CO2 20* 22 24  GLUCOSE 109* 100* 91  BUN 18 26* 29*  CREATININE 1.16 1.81* 1.44*  CALCIUM 7.8* 8.4* 8.3*  MG  --  1.9  --   PROT 5.8*  --   --  ALBUMIN 2.9*  --   --   AST 31  --   --   ALT 17  --   --   ALKPHOS 55  --   --   BILITOT 1.3*  --   --   GFRNONAA >60 36* 47*  ANIONGAP '11 8 8    '$ Lipids  Recent Labs  Lab 06/05/22 0430  CHOL 147  TRIG 59  HDL 52  LDLCALC 83  CHOLHDL 2.8    Hematology Recent Labs  Lab 06/04/22 0932  WBC 7.3  RBC 3.90*  HGB 13.6  HCT 39.5  MCV 101.3*  MCH 34.9*  MCHC 34.4  RDW 12.6  PLT 275   Thyroid  Recent Labs  Lab 06/05/22 0430  TSH 0.963    BNP Recent Labs  Lab 06/04/22 0932  BNP 317.8*    DDimer No results for input(s): "DDIMER" in the last 168 hours.   Radiology    ECHOCARDIOGRAM COMPLETE  Result Date: 06/05/2022    ECHOCARDIOGRAM REPORT   Patient Name:   Daniel Yu Date of Exam: 06/05/2022 Medical Rec #:  [id number]      Height:       70.0 in Accession #:    [id number]     Weight:       191.0 lb Date of Birth:  1932-08-10       BSA:          2.047 m Patient Age:    88 years       BP:           109/56 mmHg Patient Gender: M              HR:           67 bpm. Exam Location:  Inpatient Procedure: 2D Echo, Cardiac  Doppler and Color Doppler Indications:    [place] respiratory distress R06.03  History:        Patient has prior history of Echocardiogram examinations, most                 recent 04/21/2021. CHF, Arrythmias:Atrial Fibrillation; Risk                 Factors:Hypertension.  Sonographer:    Ronny Flurry Referring Phys: [id number] [address]  1. Left ventricular ejection fraction, by estimation, is 55 to 60%. The left ventricle has normal function. The left ventricle has no regional wall motion abnormalities. There is mild concentric left ventricular hypertrophy. Left ventricular diastolic parameters are consistent with Grade II diastolic dysfunction (pseudonormalization).  2. Right ventricular systolic function is mildly reduced. The right ventricular size is mildly enlarged.  3. Left atrial size was mildly dilated.  4. Right atrial size was mildly dilated.  5. The mitral valve is grossly normal. Trivial mitral valve regurgitation.  6. The aortic valve is tricuspid. There is mild calcification of the aortic valve. There is mild thickening of the aortic valve. Aortic valve regurgitation is not visualized. Aortic valve sclerosis/calcification is present, without any evidence of aortic stenosis.  7. Aortic dilatation noted. There is borderline dilatation of the ascending aorta, measuring 38 mm.  8. The inferior vena cava is normal in size with greater than 50% respiratory variability, suggesting right atrial pressure of 3 mmHg. Comparison(s): Prior images reviewed side by side. Compared to prior TTE on 04/2021, the MR appears less on current study. Otherwise there is no significant change. FINDINGS  Left Ventricle: Left ventricular ejection fraction, by estimation, is  55 to 60%. The left ventricle has normal function. The left ventricle has no regional wall motion abnormalities. The left ventricular internal cavity size was normal in size. There is  mild concentric left ventricular hypertrophy. Left  ventricular diastolic parameters are consistent with Grade II diastolic dysfunction (pseudonormalization). Right Ventricle: The right ventricular size is mildly enlarged. No increase in right ventricular wall thickness. Right ventricular systolic function is mildly reduced. Left Atrium: Left atrial size was mildly dilated. Right Atrium: Right atrial size was mildly dilated. Pericardium: There is no evidence of pericardial effusion. Mitral Valve: The mitral valve is grossly normal. There is mild thickening of the mitral valve leaflet(s). There is mild calcification of the mitral valve leaflet(s). Trivial mitral valve regurgitation. Tricuspid Valve: The tricuspid valve is normal in structure. Tricuspid valve regurgitation is trivial. Aortic Valve: The aortic valve is tricuspid. There is mild calcification of the aortic valve. There is mild thickening of the aortic valve. Aortic valve regurgitation is not visualized. Aortic valve sclerosis/calcification is present, without any evidence of aortic stenosis. Aortic valve mean gradient measures 3.0 mmHg. Aortic valve peak gradient measures 5.9 mmHg. Aortic valve area, by VTI measures 2.50 cm. Pulmonic Valve: The pulmonic valve was normal in structure. Pulmonic valve regurgitation is trivial. Aorta: Aortic dilatation noted. There is borderline dilatation of the ascending aorta, measuring 38 mm. Venous: The inferior vena cava was not well visualized. The inferior vena cava is normal in size with greater than 50% respiratory variability, suggesting right atrial pressure of 3 mmHg. IAS/Shunts: The atrial septum is grossly normal.  LEFT VENTRICLE PLAX 2D LVIDd:         4.60 cm   Diastology LVIDs:         3.30 cm   LV e' medial:    2.81 cm/s LV PW:         1.20 cm   LV E/e' medial:  24.4 LV IVS:        1.30 cm   LV e' lateral:   7.50 cm/s LVOT diam:     2.20 cm   LV E/e' lateral: 9.1 LV SV:         55 LV SV Index:   27 LVOT Area:     3.80 cm  RIGHT VENTRICLE RV S prime:      7.65 cm/s TAPSE (M-mode): 1.3 cm LEFT ATRIUM             Index        RIGHT ATRIUM           Index LA diam:        3.70 cm 1.81 cm/m   RA Area:     24.70 cm LA Vol (A2C):   71.5 ml 34.93 ml/m  RA Volume:   79.10 ml  38.64 ml/m LA Vol (A4C):   65.4 ml 31.95 ml/m LA Biplane Vol: 69.8 ml 34.10 ml/m  AORTIC VALVE AV Area (Vmax):    2.48 cm AV Area (Vmean):   2.86 cm AV Area (VTI):     2.50 cm AV Vmax:           121.00 cm/s AV Vmean:          80.100 cm/s AV VTI:            0.219 m AV Peak Grad:      5.9 mmHg AV Mean Grad:      3.0 mmHg LVOT Vmax:         78.90 cm/s LVOT Vmean:  60.300 cm/s LVOT VTI:          0.144 m LVOT/AV VTI ratio: 0.66  AORTA Ao Root diam: 3.40 cm Ao Asc diam:  3.80 cm MITRAL VALVE MV Area (PHT): 3.53 cm    SHUNTS MV Decel Time: 215 msec    Systemic VTI:  0.14 m MV E velocity: 68.60 cm/s  Systemic Diam: 2.20 cm MV A velocity: 54.20 cm/s MV E/A ratio:  1.27 Gwyndolyn Kaufman MD Electronically signed by Gwyndolyn Kaufman MD Signature Date/Time: 06/05/2022/12:03:10 PM    Final     Cardiac Studies   Echo 06/05/22: 1. Left ventricular ejection fraction, by estimation, is 55 to 60%. The  left ventricle has normal function. The left ventricle has no regional  wall motion abnormalities. There is mild concentric left ventricular  hypertrophy. Left ventricular diastolic  parameters are consistent with Grade II diastolic dysfunction  (pseudonormalization).   2. Right ventricular systolic function is mildly reduced. The right  ventricular size is mildly enlarged.   3. Left atrial size was mildly dilated.   4. Right atrial size was mildly dilated.   5. The mitral valve is grossly normal. Trivial mitral valve  regurgitation.   6. The aortic valve is tricuspid. There is mild calcification of the  aortic valve. There is mild thickening of the aortic valve. Aortic valve  regurgitation is not visualized. Aortic valve sclerosis/calcification is  present, without any evidence of   aortic stenosis.   7. Aortic dilatation noted. There is borderline dilatation of the  ascending aorta, measuring 38 mm.   8. The inferior vena cava is normal in size with greater than 50%  respiratory variability, suggesting right atrial pressure of 3 mmHg.   LHC 05/23/22:   Ost LM to Dist LM lesion is 10% stenosed.   Prox LAD lesion is 40% stenosed.   There is mild left ventricular systolic dysfunction.   LV end diastolic pressure is moderately elevated.   The left ventricular ejection fraction is 50-55% by visual estimate.   Nonobstructive CAD. The left main and LAD are calcified but no obstructive disease LV function appears low normal 50% Moderately elevated LVEDP 24 mm Hg   Plan: medical management. I would suggest evaluating LV function with Echo.     Patient Profile     Daniel Yu is an 75M with non-obstructive CAD, HTN, HL, PAF, and mitral regurgitation admitted with acute on chronic diastolic heart failure.   Assessment & Plan    #Acute on chronic diastolic heart failure: #Mitral regurgitation: # AKI: Patient was admitted with volume overload.  He is feeling better after diuresis but now with AKI.  Renal function better after holding lasix.  Will resume lasix '40mg'$  daily tomorrow. Continue losartan and spironolactone.  Add Farxiga '10mg'$  daily.  BMP in 1 week.  We discussed the importance of weighing himself daily and if his weight goes up by 2 pounds in 1 day or 5 pounds over the course of the week we will need to double his Lasix to 80 mg.     # Hypertension:  Blood pressures have been labile at home.  Better since he was started on hydralazine but still not quite at goal.  In the hospital spironolactone was added and pressure seems stable.  Continue amlodipine at reduced dose as above.     #Nonobstructive CAD: Started atorvastatin 2/2 3v CAD on chest CT this admission.  Now with new rash.  WIll hold.  Consider lower dose of rosuvasatin once rash resolves.  #  PAF:   Maintaining sinus rhythm on amiodarone.  Continue Eliquis.  For questions or updates, please contact [place] Please consult [email or web address] for contact info under        Signed, Skeet Latch, MD  06/07/2022, 10:21 AM

## 2022-06-12 NOTE — Progress Notes (Addendum)
HEART & VASCULAR TRANSITION OF CARE CONSULT NOTE     Referring Physician: Dr. Oval Linsey Primary Care: Dr. Koleen Nimrod Primary Cardiologist: Dr. Stanford Breed  HPI: Referred to clinic by Dr. Oval Linsey with [facility] for heart failure consultation. 88 y.o. male with history of paroxysmal AF (s/p DCCV 2018, 2021, June 2023 on amiodarone), nonobstructive CAD on [facility] 10/23, HFmEF with recovered EF, HTN.   Has hx of cardiomyopathy with EF 45-50% on echo in June 2018. This was in setting of afib and acute cholecystitis s/p cholecystectomy. He underwent cardioversion in 2018 and again in 2021. EF subsequently improved to 60-65% on echo in 09/22.   Had recurrent afib in June 2023 and underwent DCCV. Several days later he presented to the ED with lightheadedness and dyspnea. He was bradycardic with rates in the 40s. He diuresed with IV lasix and was discharged home. Metoprolol and diltiazem were later discontinued d/t bradycardia and hypotension.  Had outpatient [facility] 05/23/22 following an abnormal stress PET. He had moderate nonobstructive CAD and elevated LVEDP at 24 mmHg. Furosemide was increased. BP had been variable at home over the last few months and had several medication adjustments.   Presented to the ED 06/04/22 with a/c CHF. CTA chest negative for PE but did show evidence of pulmonary edema. HS troponin mildly elevated 102>204>473. He diuresed with IV lasix. Developed AKI d/t overdiuresis.   Echo during admit with EF 55-60%, G2 DD, RV mildly reduced, mild BAE  He has been doing well since discharge. Dyspnea significantly improved. No lower extremity edema, orthopnea or PND. Has lost another 6 lb, down to 184-185 lb on home scale. Has been adherent with all medications. His wife prepares meals, very diligent about following low-sodium diet. BP has been ranging from 110s-120s/60s. No dizziness. He exercises at the gym on a stationary bike 2-3 days a week. Wondering if it is safe to get back to  his routine. Previously walked several miles a day but limited by knee pain and back pain.    Review of Systems: [y] = yes, '[ ]'$  = no   General: Weight gain '[ ]'$ ; Weight loss '[ ]'$ ; Anorexia '[ ]'$ ; Fatigue '[ ]'$ ; Fever '[ ]'$ ; Chills '[ ]'$ ; Weakness '[ ]'$   Cardiac: Chest pain/pressure '[ ]'$ ; Resting SOB '[ ]'$ ; Exertional SOB '[ ]'$ ; Orthopnea '[ ]'$ ; Pedal Edema '[ ]'$ ; Palpitations '[ ]'$ ; Syncope '[ ]'$ ; Presyncope '[ ]'$ ; Paroxysmal nocturnal dyspnea'[ ]'$   Pulmonary: Cough '[ ]'$ ; Wheezing'[ ]'$ ; Hemoptysis'[ ]'$ ; Sputum '[ ]'$ ; Snoring '[ ]'$   GI: Vomiting'[ ]'$ ; Dysphagia'[ ]'$ ; Melena'[ ]'$ ; Hematochezia '[ ]'$ ; Heartburn'[ ]'$ ; Abdominal pain '[ ]'$ ; Constipation '[ ]'$ ; Diarrhea '[ ]'$ ; BRBPR '[ ]'$   GU: Hematuria'[ ]'$ ; Dysuria '[ ]'$ ; Nocturia'[ ]'$   Vascular: Pain in legs with walking '[ ]'$ ; Pain in feet with lying flat '[ ]'$ ; Non-healing sores '[ ]'$ ; Stroke '[ ]'$ ; TIA '[ ]'$ ; Slurred speech '[ ]'$ ;  Neuro: Headaches'[ ]'$ ; Vertigo'[ ]'$ ; Seizures'[ ]'$ ; Paresthesias'[ ]'$ ;Blurred vision '[ ]'$ ; Diplopia '[ ]'$ ; Vision changes '[ ]'$   Ortho/Skin: Arthritis '[ ]'$ ; Joint pain [Y]; Muscle pain '[ ]'$ ; Joint swelling '[ ]'$ ; Back Pain '[ ]'$ ; Rash '[ ]'$   Psych: Depression'[ ]'$ ; Anxiety'[ ]'$   Heme: Bleeding problems '[ ]'$ ; Clotting disorders '[ ]'$ ; Anemia '[ ]'$   Endocrine: Diabetes '[ ]'$ ; Thyroid dysfunction'[ ]'$    Past Medical History:  Diagnosis Date   Arthritis    right knee oa   Asthma    Carpal tunnel syndrome    BILATERAL  Cholecystitis    Complication of anesthesia    adverse reaction to the spinal   Hypertension    PAF (paroxysmal atrial fibrillation) (HCC)    SBO (small bowel obstruction) ([place]) 12/2015    Current Outpatient Medications  Medication Sig Dispense Refill   amiodarone (PACERONE) 200 MG tablet Take 1 tablet (200 mg total) by mouth daily. 90 tablet 3   amLODipine (NORVASC) 5 MG tablet Take 1 tablet (5 mg total) by mouth daily. 90 tablet 3   Ascorbic Acid (VITAMIN C) 1000 MG tablet Take 1,000 mg by mouth in the morning.     cetirizine (ZYRTEC) 10 MG tablet Take 10 mg by mouth in the morning.      dapagliflozin propanediol (FARXIGA) 10 MG TABS tablet Take 1 tablet (10 mg total) by mouth daily. 30 tablet 11   ELIQUIS 5 MG TABS tablet TAKE 1 TABLET(5 MG) BY MOUTH TWICE DAILY 180 tablet 1   fluticasone (FLOVENT HFA) 110 MCG/ACT inhaler Inhale 1 puff into the lungs 2 (two) times daily.      furosemide (LASIX) 20 MG tablet Take 2 tablets (40 mg total) by mouth daily. 180 tablet 3   hydrocortisone cream 1 % Apply topically 4 (four) times daily. 28 g 0   losartan (COZAAR) 100 MG tablet Take 1 tablet (100 mg total) by mouth daily. 90 tablet 3   spironolactone (ALDACTONE) 25 MG tablet Take 1 tablet (25 mg total) by mouth daily. 90 tablet 3   No current facility-administered medications for this encounter.    Allergies  Allergen Reactions   Zestril [Lisinopril] Other (See Comments)    Unknown reaction type   Bee Venom Swelling and Rash      Social History   Socioeconomic History   Marital status: Married    Spouse name: [name]   Number of children: 2   Years of education: Not on file   Highest education level: Master's degree (e.g., MA, MS, MEng, MEd, MSW, MBA)  Occupational History   Occupation: retired  Tobacco Use   Smoking status: Never   Smokeless tobacco: Never  Vaping Use   Vaping Use: Never used  Substance and Sexual Activity   Alcohol use: Yes    Comment: 1 beer daily   Drug use: No   Sexual activity: Not on file  Other Topics Concern   Not on file  Social History Narrative   Not on file   Social Determinants of Health   Financial Resource Strain: Low Risk  (06/06/2022)   Overall Financial Resource Strain (CARDIA)    Difficulty of Paying Living Expenses: Not hard at all  Food Insecurity: No Food Insecurity (06/06/2022)   Hunger Vital Sign    Worried About Running Out of Food in the Last Year: Never true    [place] in the Last Year: Never true  Transportation Needs: No Transportation Needs (06/06/2022)   PRAPARE - Radiographer, therapeutic (Medical): No    Lack of Transportation (Non-Medical): No  Physical Activity: Not on file  Stress: Not on file  Social Connections: Not on file  Intimate Partner Violence: Not on file      Family History  Problem Relation Age of Onset   Hypertension Father     Vitals:   06/13/22 1000  BP: 100/60  Pulse: 66  SpO2: 99%  Weight: 86.4 kg (190 lb 6.4 oz)    PHYSICAL EXAM: General:  Appears younger than stated age. HEENT: normal Neck:  supple. no JVD. Carotids 2+ bilat; no bruits.  Cor: PMI nondisplaced. Regular rate & rhythm. No rubs, gallops or murmurs. Lungs: clear Abdomen: soft, nontender, nondistended.  Extremities: no cyanosis, clubbing, rash, edema Neuro: alert & oriented x 3, cranial nerves grossly intact. moves all 4 extremities w/o difficulty. Affect pleasant.  ECG: SR 71 bpm, 1st degree AVB  ReDS 34%   ASSESSMENT & PLAN: HFpEF -Hx cardiomyopathy with recovered EF -EF 45-50% in 2018 in setting of new AF and acute cholecystitis -EF improved to 60-65% in 2022 -Echo 10/23: EF 55-60%, G2 DD, RV mildly reduced, mild BAE -Trigger for recent decompensation not certain. Has been maintaining SR. Adherent with fluid/sodium restriction as well as meds. Notes BP has been labile for several months and had multiple med adjustments. BP now better controlled. -NYHA II. Volume good on exam, ReDS 34%. Continue lasix 40 mg daily. Reviewed when to take an extra 40 mg lasix PRN for weight gain, dyspnea, etc. -Continue spiro 25 mg daily -Continue Farxiga 10 mg daily -Continue losartan 100 mg daily. Could consider switching to [facility] in the future but given his age would need to watch for hypotension and potentially stop amlodipine.  -Labs today -Encouraged him to continue with regular exercise  CAD -Moderate nonobstructive CAD on [facility] 10/23 -No angina -No aspirin since anticoagulated. -Developed rash shortly after atorvastatin added during recent admission. Defer  statin therapy to his primary cardiology team.  HTN -BP soft today. BP consistently 110s-120s at home since discharge. -Meds as above -Can consider stopping amlodipine and switching Losartan to Entresto especially if has recurrent trouble with volume overload  PAF -SR today -On amiodarone 200 mg daily. Recent TSH and LFTs okay. -Anticoagulated with Eliquis 5 mg BID  Recent AKI -AKI during recent admit d/t overdiuresis -Scr baseline around 1.1-1.2, up to 1.8. Improved to 1.4 on discharge. -Labs today   NYHA II GDMT  Diuretic-Lasix 40 mg daily BB-No, hx bradycardia Ace/ARB/ARNI-Losartan 100 mg daily (can consider Entresto in the future) MRA-Spiro 25 mg daily SGLT2i-Farxiga 10 mg daily    Referred to HFSW (PCP, Medications, Transportation, ETOH Abuse, Drug Abuse, Insurance, Museum/gallery curator ): No Refer to Pharmacy: No Refer to Home Health: No Refer to Advanced Heart Failure Clinic: No  Refer to General Cardiology: No, already established  Follow up: PRN, follow-up as scheduled with Cardiology later this week

## 2022-06-13 ENCOUNTER — Telehealth (HOSPITAL_COMMUNITY): Payer: Self-pay | Admitting: *Deleted

## 2022-06-13 ENCOUNTER — Encounter (HOSPITAL_COMMUNITY): Payer: Self-pay

## 2022-06-13 ENCOUNTER — Other Ambulatory Visit (HOSPITAL_COMMUNITY): Payer: Self-pay

## 2022-06-13 ENCOUNTER — Ambulatory Visit (HOSPITAL_COMMUNITY)
Admit: 2022-06-13 | Discharge: 2022-06-13 | Disposition: A | Discharge status: Home / Self Care | Payer: Medicare Other | Attending: Physician Assistant | Admitting: Physician Assistant

## 2022-06-13 VITALS — BP 100/60 | HR 66 | Wt 190.4 lb

## 2022-06-13 DIAGNOSIS — I480 Paroxysmal atrial fibrillation: Secondary | ICD-10-CM | POA: Insufficient documentation

## 2022-06-13 DIAGNOSIS — Z7182 Exercise counseling: Secondary | ICD-10-CM | POA: Insufficient documentation

## 2022-06-13 DIAGNOSIS — N179 Acute kidney failure, unspecified: Secondary | ICD-10-CM

## 2022-06-13 DIAGNOSIS — I2510 Atherosclerotic heart disease of native coronary artery without angina pectoris: Secondary | ICD-10-CM | POA: Insufficient documentation

## 2022-06-13 DIAGNOSIS — I5032 Chronic diastolic (congestive) heart failure: Secondary | ICD-10-CM

## 2022-06-13 DIAGNOSIS — I959 Hypotension, unspecified: Secondary | ICD-10-CM | POA: Insufficient documentation

## 2022-06-13 DIAGNOSIS — M549 Dorsalgia, unspecified: Secondary | ICD-10-CM | POA: Insufficient documentation

## 2022-06-13 DIAGNOSIS — Z9049 Acquired absence of other specified parts of digestive tract: Secondary | ICD-10-CM | POA: Diagnosis not present

## 2022-06-13 DIAGNOSIS — Z79899 Other long term (current) drug therapy: Secondary | ICD-10-CM | POA: Insufficient documentation

## 2022-06-13 DIAGNOSIS — R21 Rash and other nonspecific skin eruption: Secondary | ICD-10-CM | POA: Insufficient documentation

## 2022-06-13 DIAGNOSIS — Z7901 Long term (current) use of anticoagulants: Secondary | ICD-10-CM | POA: Insufficient documentation

## 2022-06-13 DIAGNOSIS — Z7984 Long term (current) use of oral hypoglycemic drugs: Secondary | ICD-10-CM | POA: Diagnosis not present

## 2022-06-13 DIAGNOSIS — R001 Bradycardia, unspecified: Secondary | ICD-10-CM | POA: Diagnosis not present

## 2022-06-13 DIAGNOSIS — I110 Hypertensive heart disease with heart failure: Secondary | ICD-10-CM | POA: Diagnosis not present

## 2022-06-13 DIAGNOSIS — M25569 Pain in unspecified knee: Secondary | ICD-10-CM | POA: Diagnosis not present

## 2022-06-13 DIAGNOSIS — I10 Essential (primary) hypertension: Secondary | ICD-10-CM

## 2022-06-13 LAB — BASIC METABOLIC PANEL
Anion gap: 8 (ref 5–15)
BUN: 28 mg/dL — ABNORMAL HIGH (ref 8–23)
CO2: 24 mmol/L (ref 22–32)
Calcium: 8.8 mg/dL — ABNORMAL LOW (ref 8.9–10.3)
Chloride: 102 mmol/L (ref 98–111)
Creatinine, Ser: 1.42 mg/dL — ABNORMAL HIGH (ref 0.61–1.24)
GFR, Estimated: 48 mL/min — ABNORMAL LOW (ref >60)
Glucose, Bld: 83 mg/dL (ref 70–99)
Potassium: 4.5 mmol/L (ref 3.5–5.1)
Sodium: 134 mmol/L — ABNORMAL LOW (ref 135–145)

## 2022-06-13 LAB — BRAIN NATRIURETIC PEPTIDE: B Natriuretic Peptide: 155.2 pg/mL — ABNORMAL HIGH (ref 0.0–100.0)

## 2022-06-13 NOTE — Patient Instructions (Addendum)
Good to see you today!  Labs done today, your results will be available in MyChart, we will contact you for abnormal readings.   Your physician recommends that you schedule a follow-up appointment in: as needed  If you have any questions or concerns before your next appointment please send [place] a message through [place] or call our office at [phone].    TO LEAVE A MESSAGE FOR THE NURSE SELECT OPTION 2, PLEASE LEAVE A MESSAGE INCLUDING: YOUR NAME DATE OF BIRTH CALL BACK NUMBER REASON FOR CALL**this is important as we prioritize the call backs  YOU WILL RECEIVE A CALL BACK THE SAME DAY AS LONG AS YOU CALL BEFORE 4:00 PM  At the [facility], you and your health needs are our priority. As part of our continuing mission to provide you with exceptional heart care, we have created designated Provider Care Teams. These Care Teams include your primary Cardiologist (physician) and Advanced Practice Providers (APPs- Physician Assistants and Nurse Practitioners) who all work together to provide you with the care you need, when you need it.   You may see any of the following providers on your designated Care Team at your next follow up: Dr Glori Bickers Dr Loralie Champagne Dr. Roxana Hires, NP Lyda Jester, [facility] [place], [place] Forestine Na, NP Audry Riles, PharmD   Please be sure to bring in all your medications bottles to every appointment.   Do the following things EVERYDAY: Weigh yourself in the morning before breakfast. Write it down and keep it in a log. Take your medicines as prescribed Eat low salt foods--Limit salt (sodium) to 2000 mg per day.  Stay as active as you can everyday Limit all fluids for the day to less than 2 liters

## 2022-06-13 NOTE — Progress Notes (Signed)
ReDS Vest / Clip - 06/13/22 1100       ReDS Vest / Clip   Station Marker D    Ruler Value 33    ReDS Value Range Low volume    ReDS Actual Value 34

## 2022-06-13 NOTE — Telephone Encounter (Signed)
Call attempted to confirm [facility] TOC appt 10 am on 06/13/22. HIPPA appropriate VM left with callback number.     Earnestine Leys, BSN, Clinical cytogeneticist Only

## 2022-06-13 NOTE — Addendum Note (Signed)
Encounter addended by: Joette Catching, PA-C on: 06/13/2022 12:34 PM  Actions taken: Clinical Note Signed

## 2022-06-15 NOTE — Progress Notes (Signed)
Cardiology Clinic Note   Patient Name: Daniel Yu Date of Encounter: 06/16/2022  Primary Care Provider:  Kathalene Frames, MD Primary Cardiologist:  Daniel Ruths, MD  Patient Profile    88 year old male with history of paroxysmal atrial fibrillation, CHA2DS2-VASc score of 2, this was diagnosed in June 2018, he is status post DCCV in 2018, with a repeat DCCV in 2021.  He is on amiodarone and apixaban.  He was initially admitted for left heart catheterization in the setting of abnormal coronary CTA with severe coronary calcifications noted in the left anterior descending artery left circumflex and right coronary artery distribution.  Moderate nonobstructive CAD per left heart cath dated 05/23/2022 (10% left main, 40% proximal LAD), HFpEF, hypertension, mild to moderate MR.    Recent admission to [facility] between 06/04/2022 and 06/07/2022, for worsening dyspnea, HFpEF exacerbation, with sequela of significant lower extremity edema.  Past Medical History    Past Medical History:  Diagnosis Date   Arthritis    right knee oa   Asthma    Carpal tunnel syndrome    BILATERAL   Cholecystitis    Complication of anesthesia    adverse reaction to the spinal   Hypertension    PAF (paroxysmal atrial fibrillation) (HCC)    SBO (small bowel obstruction) ([place]) 12/2015   Past Surgical History:  Procedure Laterality Date   CARDIOVERSION N/A 02/14/2017   Procedure: CARDIOVERSION;  Surgeon: Daniel Hector, MD;  Location: [place];  Service: Cardiovascular;  Laterality: N/A;   CARDIOVERSION N/A 12/11/2019   Procedure: CARDIOVERSION;  Surgeon: Daniel Perla, MD;  Location: [place];  Service: Cardiovascular;  Laterality: N/A;   CARDIOVERSION N/A 02/13/2022   Procedure: CARDIOVERSION;  Surgeon: Daniel Perla, MD;  Location: [facility] ENDOSCOPY;  Service: Cardiovascular;  Laterality: N/A;   CHOLECYSTECTOMY  02/09/2017   CHOLECYSTECTOMY N/A 02/09/2017   Procedure: LAPAROSCOPIC  CHOLECYSTECTOMY WITH INTRAOPERATIVE CHOLANGIOGRAM;  Surgeon: Daniel Mesa, MD;  Location: [place];  Service: General;  Laterality: N/A;   COLONOSCOPY WITH PROPOFOL N/A 11/09/2014   Procedure: COLONOSCOPY WITH PROPOFOL;  Surgeon: Daniel Fair, MD;  Location: [facility] ENDOSCOPY;  Service: Endoscopy;  Laterality: N/A;   HERNIA REPAIR     2013   LEFT HEART CATH AND CORONARY ANGIOGRAPHY N/A 05/23/2022   Procedure: LEFT HEART CATH AND CORONARY ANGIOGRAPHY;  Surgeon: Martinique, Peter M, MD;  Location: [place] CV LAB;  Service: Cardiovascular;  Laterality: N/A;   QUADRICEPS TENDON REPAIR Left 06/07/2015   Procedure: REPAIR LEFT QUADRICEP TENDON;  Surgeon: Daniel Arabian, MD;  Location: [facility] ORS;  Service: Orthopedics;  Laterality: Left;   TEE WITHOUT CARDIOVERSION N/A 02/14/2017   Procedure: TRANSESOPHAGEAL ECHOCARDIOGRAM (TEE);  Surgeon: Daniel Hector, MD;  Location[facility] ENDOSCOPY;  Service: Cardiovascular;  Laterality: N/A;   TOTAL KNEE ARTHROPLASTY Right 12/27/2015   Procedure: RIGHT TOTAL KNEE ARTHROPLASTY;  Surgeon: Daniel Arabian, MD;  Location: [facility] ORS;  Service: Orthopedics;  Laterality: Right;    Allergies  Allergies  Allergen Reactions   Asa [Aspirin] Hives   Atorvastatin Hives   Zestril [Lisinopril] Other (See Comments)    Unknown reaction type   Bee Venom Swelling and Rash    History of Present Illness    Mr.Daniel Yu is here on posthospitalization follow-up after admission for HFpEF exacerbation after admission between 06/04/2022 and 06/07/2022, with other history as outlined above.  He was diuresed with IV Lasix with 1 L urine output.  The patient was admitted on 05/23/2022 for left heart catheterization for  left heart catheterization, after coronary CTA on 05/17/2022 revealed severe coronary calcifications present in the left anterior descending artery left circumflex artery and right coronary artery distribution.  On discharge the patient was started on spironolactone 25 mg daily,  consideration for SGLT inhibition prescription depending upon price and insurance coverage, chest x-ray revealed bilateral interstitial thickening suspicious for interstitial lung disease.  Could follow-up with a high-resolution CT scan if necessary.  PET prior to catheterization revealed mild subpleural reticulation and signs of basilar atelectasis without consolidation, on  05/16/2022.  He was also started on atorvastatin 20 mg daily.  For A-fib he was continued on apixaban 5 mg twice daily amiodarone 200 mg daily.  TSH was 0.963.  Hydralazine was discontinued, and lieu of starting spironolactone.  He comes today without any complaints.  He states he feels enormously better after having diuresis.  He has lost 10 pounds, he would like to begin more exercise.  He is medically compliant.  During hospitalization he had allergic reaction to either aspirin or atorvastatin.  This has been added to his allergy list  Home Medications    Current Outpatient Medications  Medication Sig Dispense Refill   apixaban (ELIQUIS) 5 MG TABS tablet Take 1 tablet (5 mg total) by mouth 2 (two) times daily. 54 tablet 0   Ascorbic Acid (VITAMIN C) 1000 MG tablet Take 1,000 mg by mouth in the morning.     cetirizine (ZYRTEC) 10 MG tablet Take 10 mg by mouth in the morning.     dapagliflozin propanediol (FARXIGA) 10 MG TABS tablet Take 1 tablet (10 mg total) by mouth daily before breakfast. 28 tablet 0   fluticasone (FLOVENT HFA) 110 MCG/ACT inhaler Inhale 1 puff into the lungs 2 (two) times daily.      hydrocortisone cream 1 % Apply topically 4 (four) times daily. 28 g 0   amiodarone (PACERONE) 200 MG tablet Take 1 tablet (200 mg total) by mouth daily. 90 tablet 3   amLODipine (NORVASC) 5 MG tablet Take 1 tablet (5 mg total) by mouth daily. 90 tablet 3   apixaban (ELIQUIS) 5 MG TABS tablet TAKE 1 TABLET(5 MG) BY MOUTH TWICE DAILY 180 tablet 1   dapagliflozin propanediol (FARXIGA) 10 MG TABS tablet Take 1 tablet (10 mg total)  by mouth daily. 30 tablet 11   furosemide (LASIX) 20 MG tablet Take 2 tablets (40 mg total) by mouth daily. 180 tablet 3   losartan (COZAAR) 100 MG tablet Take 1 tablet (100 mg total) by mouth daily. 90 tablet 3   spironolactone (ALDACTONE) 25 MG tablet Take 1 tablet (25 mg total) by mouth daily. 90 tablet 3   No current facility-administered medications for this visit.     Family History    Family History  Problem Relation Age of Onset   Hypertension Father    He indicated that his mother is deceased. He indicated that his father is deceased. He indicated that his maternal grandmother is deceased. He indicated that his maternal grandfather is deceased. He indicated that his paternal grandmother is deceased. He indicated that his paternal grandfather is deceased.  Social History    Social History   Socioeconomic History   Marital status: Married    Spouse name: [name]   Number of children: 2   Years of education: Not on file   Highest education level: Master's degree (e.g., MA, MS, MEng, MEd, MSW, MBA)  Occupational History   Occupation: retired  Tobacco Use   Smoking status: Never  Smokeless tobacco: Never  Vaping Use   Vaping Use: Never used  Substance and Sexual Activity   Alcohol use: Yes    Comment: 1 beer daily   Drug use: No   Sexual activity: Not on file  Other Topics Concern   Not on file  Social History Narrative   Not on file   Social Determinants of Health   Financial Resource Strain: Low Risk  (06/06/2022)   Overall Financial Resource Strain (CARDIA)    Difficulty of Paying Living Expenses: Not hard at all  Food Insecurity: No Food Insecurity (06/06/2022)   Hunger Vital Sign    Worried About Running Out of Food in the Last Year: Never true    Ran Out of Food in the Last Year: Never true  Transportation Needs: No Transportation Needs (06/06/2022)   PRAPARE - Hydrologist (Medical): No    Lack of Transportation  (Non-Medical): No  Physical Activity: Not on file  Stress: Not on file  Social Connections: Not on file  Intimate Partner Violence: Not on file     Review of Systems    General:  No chills, fever, night sweats or weight changes.  Cardiovascular:  No chest pain, dyspnea on exertion, edema, orthopnea, palpitations, paroxysmal nocturnal dyspnea. Dermatological: No rash, lesions/masses Respiratory: No cough, dyspnea Urologic: No hematuria, dysuria Abdominal:   No nausea, vomiting, diarrhea, bright red blood per rectum, melena, or hematemesis Neurologic:  No visual changes, wkns, changes in mental status. All other systems reviewed and are otherwise negative except as noted above.     Physical Exam    VS:  BP (!) 115/53   Pulse 66   Ht 5' 10" (1.778 Yu)   Wt 192 lb 12.8 oz (87.5 kg)   SpO2 96%   BMI 27.66 kg/Yu  , BMI Body mass index is 27.66 kg/Yu.     GEN: Well nourished, well developed, in no acute distress. HEENT: normal. Neck: Supple, no JVD, carotid bruits, or masses. Cardiac: RRR, no murmurs, rubs, or gallops. No clubbing, cyanosis, edema.  Radials/DP/PT 2+ and equal bilaterally.  Respiratory:  Respirations regular and unlabored, clear to auscultation bilaterally. GI: Soft, nontender, nondistended, BS + x 4. MS: no deformity or atrophy.  Right wrist catheterization insertion site completely healed Skin: warm and dry, no rash. Neuro:  Strength and sensation are intact. Psych: Normal affect.  Accessory Clinical Findings    ECG personally reviewed by me today-normal sinus rhythm, rate of 66 bpm- No acute changes.   Lab Results  Component Value Date   WBC 7.3 06/04/2022   HGB 13.6 06/04/2022   HCT 39.5 06/04/2022   MCV 101.3 (H) 06/04/2022   PLT 275 06/04/2022   Lab Results  Component Value Date   CREATININE 1.42 (H) 06/13/2022   BUN 28 (H) 06/13/2022   NA 134 (L) 06/13/2022   K 4.5 06/13/2022   CL 102 06/13/2022   CO2 24 06/13/2022   Lab Results  Component  Value Date   ALT 17 06/05/2022   AST 31 06/05/2022   ALKPHOS 55 06/05/2022   BILITOT 1.3 (H) 06/05/2022   Lab Results  Component Value Date   CHOL 147 06/05/2022   HDL 52 06/05/2022   LDLCALC 83 06/05/2022   TRIG 59 06/05/2022   CHOLHDL 2.8 06/05/2022    Lab Results  Component Value Date   HGBA1C 5.4 01/03/2016    Review of Prior Studies: Echo 06/05/22: 1. Left ventricular ejection fraction, by estimation,  is 55 to 60%. The  left ventricle has normal function. The left ventricle has no regional  wall motion abnormalities. There is mild concentric left ventricular  hypertrophy. Left ventricular diastolic  parameters are consistent with Grade II diastolic dysfunction  (pseudonormalization).   2. Right ventricular systolic function is mildly reduced. The right  ventricular size is mildly enlarged.   3. Left atrial size was mildly dilated.   4. Right atrial size was mildly dilated.   5. The mitral valve is grossly normal. Trivial mitral valve  regurgitation.   6. The aortic valve is tricuspid. There is mild calcification of the  aortic valve. There is mild thickening of the aortic valve. Aortic valve  regurgitation is not visualized. Aortic valve sclerosis/calcification is  present, without any evidence of  aortic stenosis.   7. Aortic dilatation noted. There is borderline dilatation of the  ascending aorta, measuring 38 mm.   8. The inferior vena cava is normal in size with greater than 50%  respiratory variability, suggesting right atrial pressure of 3 mmHg.    LHC 05/23/22:   Ost LM to Dist LM lesion is 10% stenosed.   Prox LAD lesion is 40% stenosed.   There is mild left ventricular systolic dysfunction.   LV end diastolic pressure is moderately elevated.   The left ventricular ejection fraction is 50-55% by visual estimate.   Nonobstructive CAD. The left main and LAD are calcified but no obstructive disease LV function appears low normal 50% Moderately elevated  LVEDP 24 mm Hg   Plan: medical management. I would suggest evaluating LV function with Echo.   Assessment & Plan   1.  Chronic diastolic heart failure: Echocardiogram grade 2 diastolic dysfunction.  Recent hospitalization for decompensation with IV diuresis losing 10 pounds.  Spironolactone 25 mg has been added to his medication regimen, hydralazine 25 mg has been discontinued.  Farxiga 10 mg has also been added to his regimen.  He is doing well and I have reinforced low-sodium diet.  I have discussed grade 2 diastolic dysfunction.  They verbalized understanding.  2.  Coronary artery disease: Cardiac catheterization 05/23/2022 revealed 10% stenosis in the ostial LM, 40% proximal LAD.  Nonobstructive CAD.  Continue aggressive risk factor management with statin therapy, low-cholesterol diet, and increased activity.  3.  Hypertension: Excellent control of blood pressure today with medication adjustments during hospitalization.  Continue regimen.  He will need follow-up TSH, be met, lipids and LFTs in December 2024 prior to following up with Dr.Crenshaw    Current medicines are reviewed at length with the patient today.  I have spent 30 min's  dedicated to the care of this patient on the date of this encounter to include pre-visit review of records, assessment, management and diagnostic testing,with shared decision making. Signed, Phill Myron. Atziry Baranski DNP, ANP, AACC   06/16/2022 10:37 AM    We have 80 this person.  Usually is in the way around the Office [phone] Fax [phone]  Notice: This dictation was prepared with Dragon dictation along with smaller phrase technology. Any transcriptional errors that result from this process are unintentional and may not be corrected upon review.

## 2022-06-16 ENCOUNTER — Ambulatory Visit: Payer: Medicare Other | Attending: Adult Health | Admitting: Adult Health

## 2022-06-16 ENCOUNTER — Encounter: Payer: Self-pay | Admitting: Adult Health

## 2022-06-16 VITALS — BP 115/53 | HR 66 | Ht 70.0 in | Wt 192.8 lb

## 2022-06-16 DIAGNOSIS — I10 Essential (primary) hypertension: Secondary | ICD-10-CM | POA: Diagnosis not present

## 2022-06-16 DIAGNOSIS — I2510 Atherosclerotic heart disease of native coronary artery without angina pectoris: Secondary | ICD-10-CM

## 2022-06-16 DIAGNOSIS — E7800 Pure hypercholesterolemia, unspecified: Secondary | ICD-10-CM | POA: Diagnosis not present

## 2022-06-16 DIAGNOSIS — R0609 Other forms of dyspnea: Secondary | ICD-10-CM | POA: Diagnosis not present

## 2022-06-16 MED ORDER — SPIRONOLACTONE 25 MG PO TABS: 25.0000 mg | ORAL_TABLET | Freq: Every day | ORAL | 3 refills | Status: DC

## 2022-06-16 MED ORDER — LOSARTAN POTASSIUM 100 MG PO TABS: 100.0000 mg | ORAL_TABLET | Freq: Every day | ORAL | 3 refills | Status: DC

## 2022-06-16 MED ORDER — FUROSEMIDE 20 MG PO TABS: 40.0000 mg | ORAL_TABLET | Freq: Every day | ORAL | 3 refills | Status: DC

## 2022-06-16 MED ORDER — DAPAGLIFLOZIN PROPANEDIOL 10 MG PO TABS: 10.0000 mg | ORAL_TABLET | Freq: Every day | ORAL | 11 refills | Status: DC

## 2022-06-16 MED ORDER — APIXABAN 5 MG PO TABS: ORAL_TABLET | ORAL | 1 refills | Status: DC

## 2022-06-16 MED ORDER — APIXABAN 5 MG PO TABS: 5.0000 mg | ORAL_TABLET | Freq: Two times a day (BID) | ORAL | 0 refills | Status: DC

## 2022-06-16 MED ORDER — AMIODARONE HCL 200 MG PO TABS: 200.0000 mg | ORAL_TABLET | Freq: Every day | ORAL | 3 refills | Status: DC

## 2022-06-16 MED ORDER — AMLODIPINE BESYLATE 5 MG PO TABS: 5.0000 mg | ORAL_TABLET | Freq: Every day | ORAL | 3 refills | Status: DC

## 2022-06-16 MED ORDER — DAPAGLIFLOZIN PROPANEDIOL 10 MG PO TABS: 10.0000 mg | ORAL_TABLET | Freq: Every day | ORAL | 0 refills | Status: DC

## 2022-06-16 NOTE — Patient Instructions (Signed)
Medication Instructions:  No Changes *If you need a refill on your cardiac medications before your next appointment, please call your pharmacy*   Lab Work: CBC, CMET, TSH. Prior to Follow up visit. If you have labs (blood work) drawn today and your tests are completely normal, you will receive your results only by: [place] (if you have MyChart) OR A paper copy in the mail If you have any lab test that is abnormal or we need to change your treatment, we will call you to review the results.   Testing/Procedures: No Testing   Follow-Up: At [facility], you and your health needs are our priority.  As part of our continuing mission to provide you with exceptional heart care, we have created designated Provider Care Teams.  These Care Teams include your primary Cardiologist (physician) and Advanced Practice Providers (APPs -  Physician Assistants and Nurse Practitioners) who all work together to provide you with the care you need, when you need it.  We recommend signing up for the patient portal called "MyChart".  Sign up information is provided on this After Visit Summary.  MyChart is used to connect with patients for Virtual Visits (Telemedicine).  Patients are able to view lab/test results, encounter notes, upcoming appointments, etc.  Non-urgent messages can be sent to your provider as well.   To learn more about what you can do with MyChart, go to [email or web address].    Your next appointment:   Keep Scheduled Appointment  The format for your next appointment:   In Person  Provider:   Kirk Ruths, MD

## 2022-06-20 DIAGNOSIS — J45909 Unspecified asthma, uncomplicated: Secondary | ICD-10-CM | POA: Diagnosis not present

## 2022-06-20 DIAGNOSIS — I480 Paroxysmal atrial fibrillation: Secondary | ICD-10-CM | POA: Diagnosis not present

## 2022-06-20 DIAGNOSIS — I5189 Other ill-defined heart diseases: Secondary | ICD-10-CM | POA: Diagnosis not present

## 2022-06-20 DIAGNOSIS — I10 Essential (primary) hypertension: Secondary | ICD-10-CM | POA: Diagnosis not present

## 2022-06-20 DIAGNOSIS — Z0000 Encounter for general adult medical examination without abnormal findings: Secondary | ICD-10-CM | POA: Diagnosis not present

## 2022-06-22 DIAGNOSIS — L820 Inflamed seborrheic keratosis: Secondary | ICD-10-CM | POA: Diagnosis not present

## 2022-06-22 DIAGNOSIS — L821 Other seborrheic keratosis: Secondary | ICD-10-CM | POA: Diagnosis not present

## 2022-06-26 ENCOUNTER — Telehealth: Payer: Self-pay | Admitting: Cardiology

## 2022-06-26 NOTE — Telephone Encounter (Signed)
Pt c/o BP issue: STAT if pt c/o blurred vision, one-sided weakness or slurred speech  1. What are your last 5 BP readings? 115/58 HR 47; 106/61; 102/60  2. Are you having any other symptoms (ex. Dizziness, headache, blurred vision, passed out)? Lightheadedness   3. What is your BP issue?

## 2022-06-26 NOTE — Telephone Encounter (Signed)
Spoke with pt wife, medications confirmed. Today is the first time he has had the dizziness with the low blood pressure. He takes all of his medications in the am and just the eliquis in the pm. Discussed with dr Stanford Breed, patient is to stop amlodipine and track bp and symptoms.

## 2022-07-04 DIAGNOSIS — L570 Actinic keratosis: Secondary | ICD-10-CM | POA: Diagnosis not present

## 2022-07-04 DIAGNOSIS — Z85828 Personal history of other malignant neoplasm of skin: Secondary | ICD-10-CM | POA: Diagnosis not present

## 2022-07-04 DIAGNOSIS — L814 Other melanin hyperpigmentation: Secondary | ICD-10-CM | POA: Diagnosis not present

## 2022-07-04 DIAGNOSIS — L821 Other seborrheic keratosis: Secondary | ICD-10-CM | POA: Diagnosis not present

## 2022-07-04 DIAGNOSIS — C44619 Basal cell carcinoma of skin of left upper limb, including shoulder: Secondary | ICD-10-CM | POA: Diagnosis not present

## 2022-07-04 DIAGNOSIS — C44719 Basal cell carcinoma of skin of left lower limb, including hip: Secondary | ICD-10-CM | POA: Diagnosis not present

## 2022-07-09 ENCOUNTER — Emergency Department (HOSPITAL_COMMUNITY): Payer: Medicare Other

## 2022-07-09 ENCOUNTER — Other Ambulatory Visit: Payer: Self-pay

## 2022-07-09 ENCOUNTER — Emergency Department (HOSPITAL_COMMUNITY)
Admission: EM | Admit: 2022-07-09 | Discharge: 2022-07-09 | Disposition: A | Discharge status: Home / Self Care | Payer: Medicare Other | Attending: Emergency Medicine | Admitting: Emergency Medicine

## 2022-07-09 DIAGNOSIS — S0990XA Unspecified injury of head, initial encounter: Secondary | ICD-10-CM | POA: Diagnosis not present

## 2022-07-09 DIAGNOSIS — Z7901 Long term (current) use of anticoagulants: Secondary | ICD-10-CM | POA: Diagnosis not present

## 2022-07-09 DIAGNOSIS — W19XXXA Unspecified fall, initial encounter: Secondary | ICD-10-CM | POA: Diagnosis not present

## 2022-07-09 DIAGNOSIS — Z79899 Other long term (current) drug therapy: Secondary | ICD-10-CM | POA: Insufficient documentation

## 2022-07-09 DIAGNOSIS — I2510 Atherosclerotic heart disease of native coronary artery without angina pectoris: Secondary | ICD-10-CM | POA: Diagnosis not present

## 2022-07-09 DIAGNOSIS — J90 Pleural effusion, not elsewhere classified: Secondary | ICD-10-CM | POA: Diagnosis not present

## 2022-07-09 DIAGNOSIS — J323 Chronic sphenoidal sinusitis: Secondary | ICD-10-CM | POA: Diagnosis not present

## 2022-07-09 DIAGNOSIS — I509 Heart failure, unspecified: Secondary | ICD-10-CM | POA: Diagnosis not present

## 2022-07-09 DIAGNOSIS — J320 Chronic maxillary sinusitis: Secondary | ICD-10-CM | POA: Diagnosis not present

## 2022-07-09 DIAGNOSIS — I110 Hypertensive heart disease with heart failure: Secondary | ICD-10-CM | POA: Diagnosis not present

## 2022-07-09 DIAGNOSIS — R55 Syncope and collapse: Secondary | ICD-10-CM | POA: Diagnosis not present

## 2022-07-09 DIAGNOSIS — I10 Essential (primary) hypertension: Secondary | ICD-10-CM | POA: Diagnosis not present

## 2022-07-09 DIAGNOSIS — M4319 Spondylolisthesis, multiple sites in spine: Secondary | ICD-10-CM | POA: Diagnosis not present

## 2022-07-09 DIAGNOSIS — S199XXA Unspecified injury of neck, initial encounter: Secondary | ICD-10-CM | POA: Diagnosis not present

## 2022-07-09 DIAGNOSIS — M25551 Pain in right hip: Secondary | ICD-10-CM | POA: Diagnosis not present

## 2022-07-09 LAB — COMPREHENSIVE METABOLIC PANEL
ALT: 21 U/L (ref 0–44)
AST: 27 U/L (ref 15–41)
Albumin: 3.2 g/dL — ABNORMAL LOW (ref 3.5–5.0)
Alkaline Phosphatase: 68 U/L (ref 38–126)
Anion gap: 13 (ref 5–15)
BUN: 31 mg/dL — ABNORMAL HIGH (ref 8–23)
CO2: 22 mmol/L (ref 22–32)
Calcium: 8.8 mg/dL — ABNORMAL LOW (ref 8.9–10.3)
Chloride: 101 mmol/L (ref 98–111)
Creatinine, Ser: 1.32 mg/dL — ABNORMAL HIGH (ref 0.61–1.24)
GFR, Estimated: 52 mL/min — ABNORMAL LOW (ref >60)
Glucose, Bld: 81 mg/dL (ref 70–99)
Potassium: 5 mmol/L (ref 3.5–5.1)
Sodium: 136 mmol/L (ref 135–145)
Total Bilirubin: 0.4 mg/dL (ref 0.3–1.2)
Total Protein: 6.4 g/dL — ABNORMAL LOW (ref 6.5–8.1)

## 2022-07-09 LAB — CBC WITH DIFFERENTIAL/PLATELET
Abs Immature Granulocytes: 0.03 10*3/uL (ref 0.00–0.07)
Basophils Absolute: 0 10*3/uL (ref 0.0–0.1)
Basophils Relative: 1 %
Eosinophils Absolute: 0.2 10*3/uL (ref 0.0–0.5)
Eosinophils Relative: 4 %
HCT: 36.6 % — ABNORMAL LOW (ref 39.0–52.0)
Hemoglobin: 12.7 g/dL — ABNORMAL LOW (ref 13.0–17.0)
Immature Granulocytes: 1 %
Lymphocytes Relative: 14 %
Lymphs Abs: 0.9 10*3/uL (ref 0.7–4.0)
MCH: 35.4 pg — ABNORMAL HIGH (ref 26.0–34.0)
MCHC: 34.7 g/dL (ref 30.0–36.0)
MCV: 101.9 fL — ABNORMAL HIGH (ref 80.0–100.0)
Monocytes Absolute: 0.7 10*3/uL (ref 0.1–1.0)
Monocytes Relative: 11 %
Neutro Abs: 4.4 10*3/uL (ref 1.7–7.7)
Neutrophils Relative %: 69 %
Platelets: 223 10*3/uL (ref 150–400)
RBC: 3.59 MIL/uL — ABNORMAL LOW (ref 4.22–5.81)
RDW: 12.3 % (ref 11.5–15.5)
WBC: 6.3 10*3/uL (ref 4.0–10.5)
nRBC: 0 % (ref 0.0–0.2)

## 2022-07-09 LAB — CBG MONITORING, ED: Glucose-Capillary: 88 mg/dL (ref 70–99)

## 2022-07-09 LAB — APTT: aPTT: 33 seconds (ref 24–36)

## 2022-07-09 LAB — PROTIME-INR
INR: 1.3 — ABNORMAL HIGH (ref 0.8–1.2)
Prothrombin Time: 16.2 seconds — ABNORMAL HIGH (ref 11.4–15.2)

## 2022-07-09 NOTE — ED Triage Notes (Signed)
Pt here after falling.  He stood up from the couch to look at something and fell, landing on R hip first and then hitting head on carpet.  Brief loc when he stood up. Pt on eliquis.  AO x 4. No deficits noted.

## 2022-07-09 NOTE — ED Provider Triage Note (Signed)
Emergency Medicine Provider Triage Evaluation Note  Daniel Yu , a 88 y.o. male  was evaluated in triage.  Pt complains of fall onset prior to arrival.  Patient notes that he stood up and felt faint and had an episode of syncope.  Notes that he landed on his right hip first and then hit the back of his head on the carpet.  No neck pain or back pain.  Patient is on Eliquis with his last dose being this morning.  No chest pain or shortness of breath.  Wife notes that she took his blood pressure after the syncopal episode and noted to have an episode of hypotension at 619 systolically.  Notes that his blood pressure rose afterwards to 509 systolic.  Review of Systems  Positive:  Negative:   Physical Exam  There were no vitals taken for this visit. Gen:   Awake, no distress Resp:  Normal effort  MSK:   Moves extremities without difficulty  Other:    Medical Decision Making  Medically screening exam initiated at 5:02 PM.  Appropriate orders placed.  Daniel Yu was informed that the remainder of the evaluation will be completed by another provider, this initial triage assessment does not replace that evaluation, and the importance of remaining in the ED until their evaluation is complete.  5:02 PM - Discussed with RN that patient is in need of a room immediately.  Patient level as a trauma due to fall on thinners.  RN aware and working on room placement.    Maanya Hippert A, PA-C 07/09/22 1705

## 2022-07-09 NOTE — Progress Notes (Signed)
Orthopedic Tech Progress Note Patient Details:  Daniel Yu 1932/01/20 [id number]  Patient ID: Daniel Yu, male   DOB: December 01, 1931, 88 y.o.   MRN: [id number] Level II; not currently needed. Vernona Rieger 07/09/2022, 5:17 PM

## 2022-07-09 NOTE — ED Notes (Signed)
Patient transported to CT 

## 2022-07-09 NOTE — Discharge Instructions (Addendum)
Please make an appointment to follow-up with your primary doctor, and be sure to discuss that you are head imaging today showed an old stroke.  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  Please follow-up with your cardiologist as soon as possible to discuss your symptoms as they may have been caused by periodic atrial fibrillation.  They may need to do a cardiac monitor to see if you are going into A-fib periodically.  Please return to the emergency department if you notice any bleeding including coughing up blood, blood in your stool, black stool, chest pain, difficulty breathing, or new episodes of passing out.  We hope that you feel better soon.

## 2022-07-09 NOTE — ED Notes (Signed)
Patient transported to MRI 

## 2022-07-09 NOTE — ED Provider Notes (Signed)
Daniel Yu   CSN: [id number] Arrival date & time: 07/09/22  1622     History  Chief Complaint  Patient presents with   Fall    On blood thinners    Daniel Yu is an 88 year old male with a history of A-fib on Eliquis, CAD, HFpEF, hypertension, mild to moderate MR who presents to the emergency department for fall.  The patient states that he got up after watching a golf tournament and started walking towards the kitchen counter to see his wife's gingerbread dough, when he all of a sudden felt lightheaded and fell to the ground.  He landed on his buttocks, and then his head fell backwards and hit the carpet.  He believes he briefly lost consciousness, but quickly awakened and was acting normally right afterwards.  He had no chest pain or shortness of breath with the episode.  He states that he now feels very well, at his baseline.  He last took his Eliquis this morning.  His wife also reports that his blood pressure was a little bit low yesterday, around 107/50s, and his heart monitor said that he was in A-fib at that time.  The patient was previously cardioverted for A-fib, and does not know when it is present.  The history is provided by the patient, a relative and medical records.  Fall       Home Medications Prior to Admission medications   Medication Sig Start Date End Date Taking? Authorizing Provider  amiodarone (PACERONE) 200 MG tablet Take 1 tablet (200 mg total) by mouth daily. 06/16/22   Lendon Colonel, NP  apixaban (ELIQUIS) 5 MG TABS tablet TAKE 1 TABLET(5 MG) BY MOUTH TWICE DAILY 06/16/22   Lendon Colonel, NP  apixaban (ELIQUIS) 5 MG TABS tablet Take 1 tablet (5 mg total) by mouth 2 (two) times daily. 06/16/22   Lendon Colonel, NP  Ascorbic Acid (VITAMIN C) 1000 MG tablet Take 1,000 mg by mouth in the morning.    [provider]  cetirizine (ZYRTEC) 10 MG tablet Take 10 mg by mouth in the  morning.    [provider]  dapagliflozin propanediol (FARXIGA) 10 MG TABS tablet Take 1 tablet (10 mg total) by mouth daily. 06/16/22   Lendon Colonel, NP  dapagliflozin propanediol (FARXIGA) 10 MG TABS tablet Take 1 tablet (10 mg total) by mouth daily before breakfast. 06/16/22   Lendon Colonel, NP  fluticasone (FLOVENT HFA) 110 MCG/ACT inhaler Inhale 1 puff into the lungs 2 (two) times daily.     [provider]  furosemide (LASIX) 20 MG tablet Take 2 tablets (40 mg total) by mouth daily. 06/16/22   Lendon Colonel, NP  hydrocortisone cream 1 % Apply topically 4 (four) times daily. 06/07/22   Margie Billet, PA-C  losartan (COZAAR) 100 MG tablet Take 1 tablet (100 mg total) by mouth daily. 06/16/22   Lendon Colonel, NP  spironolactone (ALDACTONE) 25 MG tablet Take 1 tablet (25 mg total) by mouth daily. 06/16/22   Lendon Colonel, NP      Allergies    Asa [aspirin], Atorvastatin, Zestril [lisinopril], and Bee venom    Review of Systems   Review of Systems   See HPI.   Physical Exam Updated Vital Signs BP 138/67   Pulse 62   Temp 98.2 F (36.8 C) (Oral)   Resp 17   Ht '5\' 10"'$  (1.778 m)   Wt 87.5 kg  SpO2 100%   BMI 27.68 kg/m   Physical Exam Vitals and nursing Yu reviewed.  Constitutional:      General: He is not in acute distress.    Appearance: He is not toxic-appearing.  HENT:     Head: Normocephalic and atraumatic.     Mouth/Throat:     Pharynx: Oropharynx is clear.  Eyes:     Extraocular Movements: Extraocular movements intact.     Pupils: Pupils are equal, round, and reactive to light.  Neck:     Comments: No cervical spine midline tenderness palpation. Cardiovascular:     Rate and Rhythm: Normal rate and regular rhythm.     Pulses: Normal pulses.     Heart sounds: Normal heart sounds.  Pulmonary:     Effort: Pulmonary effort is normal.     Breath sounds: Normal breath sounds.  Abdominal:     General: There is  no distension.     Palpations: Abdomen is soft.     Tenderness: There is no abdominal tenderness. There is no guarding or rebound.  Musculoskeletal:     Cervical back: Normal range of motion. No tenderness.     Comments: No T/L-spine tenderness palpation. No musculoskeletal deformities.  Skin:    General: Skin is warm and dry.  Neurological:     General: No focal deficit present.     Mental Status: He is alert and oriented to person, place, and time.     Comments: Alert and oriented.   Speech is normal.   Pupils equal, round, reactive to light.   Cranial nerves II through XII intact. Strength 5/5 in bilateral upper and lower extremities. Sensation intact throughout.     ED Results / Procedures / Treatments   Labs (all labs ordered are listed, but only abnormal results are displayed) Labs Reviewed  COMPREHENSIVE METABOLIC PANEL - Abnormal; Notable for the following components:      Result Value   BUN 31 (*)    Creatinine, Ser 1.32 (*)    Calcium 8.8 (*)    Total Protein 6.4 (*)    Albumin 3.2 (*)    GFR, Estimated 52 (*)    All other components within normal limits  CBC WITH DIFFERENTIAL/PLATELET - Abnormal; Notable for the following components:   RBC 3.59 (*)    Hemoglobin 12.7 (*)    HCT 36.6 (*)    MCV 101.9 (*)    MCH 35.4 (*)    All other components within normal limits  PROTIME-INR - Abnormal; Notable for the following components:   Prothrombin Time 16.2 (*)    INR 1.3 (*)    All other components within normal limits  APTT  URINALYSIS, ROUTINE W REFLEX MICROSCOPIC  CBG MONITORING, ED    EKG EKG Interpretation  Date/Time:  Sunday July 09 2022 17:08:07 EST Ventricular Rate:  61 PR Interval:    QRS Duration: 105 QT Interval:  475 QTC Calculation: 479 R Axis:   61 Text Interpretation: Atrial fibrillation Low voltage, precordial leads Borderline prolonged QT interval No acute changes No significant change since last tracing Confirmed by Varney Biles  [phone]) on 07/09/2022 5:10:01 PM  Radiology MR BRAIN WO CONTRAST  Result Date: 07/09/2022 CLINICAL DATA:  Syncope and collapse EXAM: MRI HEAD WITHOUT CONTRAST TECHNIQUE: Multiplanar, multiecho pulse sequences of the brain and surrounding structures were obtained without intravenous contrast. COMPARISON:  None Available. FINDINGS: Brain: No acute infarct, mass effect or extra-axial collection. No acute or chronic hemorrhage. There is multifocal hyperintense T2-weighted  signal within the white matter. Generalized volume loss. 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 The midline structures are normal. Vascular: Major flow voids are preserved. Skull and upper cervical spine: Normal calvarium and skull base. Visualized upper cervical spine and soft tissues are normal. Sinuses/Orbits:No paranasal sinus fluid levels or advanced mucosal thickening. No mastoid or middle ear effusion. Normal orbits. IMPRESSION: 1. No acute intracranial abnormality. 2. 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 Electronically Signed   By: Ulyses Jarred M.D.   On: 07/09/2022 21:04   DG Hip Unilat W or Wo Pelvis 2-3 Views Right  Result Date: 07/09/2022 CLINICAL DATA:  right hip pain EXAM: DG HIP (WITH OR WITHOUT PELVIS) 2-3V RIGHT COMPARISON:  None Available. FINDINGS: Osteopenia. No acute fracture or dislocation. Degenerative changes of the lower lumbar spine. Mild degenerative changes of the hip. Sacrum is obscured by overlapping bowel contents. No area of erosion or osseous destruction. No unexpected radiopaque foreign body. Vascular calcifications. IMPRESSION: No acute fracture or dislocation. If persistent concern for occult fracture, recommend dedicated cross-sectional imaging. Electronically Signed   By: Valentino Saxon M.D.   On: 07/09/2022 18:26   DG Chest Portable 1 View  Result Date: 07/09/2022 CLINICAL DATA:  fall EXAM: PORTABLE CHEST 1 VIEW COMPARISON:  June 04, 2022  FINDINGS: The cardiomediastinal silhouette is unchanged in contour.Atherosclerotic calcifications of the aorta. Diffuse reticular markings bilaterally, similar in comparison to prior. Trace bilateral pleural effusions. No pneumothorax. No acute pleuroparenchymal abnormality. Visualized abdomen is unremarkable. IMPRESSION: Similar appearance of diffuse reticular markings with trace bilateral pleural effusions likely reflecting underlying mild pulmonary edema. Electronically Signed   By: Valentino Saxon M.D.   On: 07/09/2022 18:24   CT Head Wo Contrast  Result Date: 07/09/2022 CLINICAL DATA:  Trauma EXAM: CT HEAD WITHOUT CONTRAST CT CERVICAL SPINE WITHOUT CONTRAST TECHNIQUE: Multidetector CT imaging of the head and cervical spine was performed following the standard protocol without intravenous contrast. Multiplanar CT image reconstructions of the cervical spine were also generated. RADIATION DOSE REDUCTION: This exam was performed according to the departmental dose-optimization program which includes automated exposure control, adjustment of the mA and/or kV according to patient size and/or use of iterative reconstruction technique. COMPARISON:  None Available. FINDINGS: CT HEAD FINDINGS Brain: Likely chronic, but technically age indeterminate infarct posterior right frontal lobe series 3). No hemorrhage. No extra-axial fluid collection. No hydrocephalus. Vascular: No hyperdense vessel or unexpected calcification. Skull: Normal. Negative for fracture or focal lesion. Sinuses/Orbits: Bilateral lens replacement. Mild mucosal thickening in maxillary sinuses. There are osseous changes of bilateral sphenoid and maxillary chronic sinusitis Other: None. CT CERVICAL SPINE FINDINGS Alignment: Grade 1 anterolisthesis of C3 on C4, C4 on C5 C6 on C7, C7 on T1, T1 on T2, T2 on T3. Skull base and vertebrae: No acute fracture. No primary bone lesion or focal pathologic process. Soft tissues and spinal canal: No prevertebral  fluid or swelling. No visible canal hematoma. Disc levels:  No evidence high-grade stenosis. Upper chest: Negative. Other: None IMPRESSION: 1. Likely chronic, but technically age indeterminate infarct in the posterior right frontal lobe. No CT evidence of intracranial injury. 2. No acute fracture or traumatic listhesis of the cervical spine. Electronically Signed   By: Marin Roberts M.D.   On: 07/09/2022 17:41   CT Cervical Spine Wo Contrast  Result Date: 07/09/2022 CLINICAL DATA:  Trauma EXAM: CT HEAD WITHOUT CONTRAST CT CERVICAL SPINE WITHOUT CONTRAST TECHNIQUE: Multidetector CT imaging of the head and cervical spine was performed  following the standard protocol without intravenous contrast. Multiplanar CT image reconstructions of the cervical spine were also generated. RADIATION DOSE REDUCTION: This exam was performed according to the departmental dose-optimization program which includes automated exposure control, adjustment of the mA and/or kV according to patient size and/or use of iterative reconstruction technique. COMPARISON:  None Available. FINDINGS: CT HEAD FINDINGS Brain: Likely chronic, but technically age indeterminate infarct posterior right frontal lobe series 3). No hemorrhage. No extra-axial fluid collection. No hydrocephalus. Vascular: No hyperdense vessel or unexpected calcification. Skull: Normal. Negative for fracture or focal lesion. Sinuses/Orbits: Bilateral lens replacement. Mild mucosal thickening in maxillary sinuses. There are osseous changes of bilateral sphenoid and maxillary chronic sinusitis Other: None. CT CERVICAL SPINE FINDINGS Alignment: Grade 1 anterolisthesis of C3 on C4, C4 on C5 C6 on C7, C7 on T1, T1 on T2, T2 on T3. Skull base and vertebrae: No acute fracture. No primary bone lesion or focal pathologic process. Soft tissues and spinal canal: No prevertebral fluid or swelling. No visible canal hematoma. Disc levels:  No evidence high-grade stenosis. Upper chest:  Negative. Other: None IMPRESSION: 1. Likely chronic, but technically age indeterminate infarct in the posterior right frontal lobe. No CT evidence of intracranial injury. 2. No acute fracture or traumatic listhesis of the cervical spine. Electronically Signed   By: Marin Roberts M.D.   On: 07/09/2022 17:41    Procedures Procedures    Medications Ordered in ED Medications - No data to display  ED Course/ Medical Decision Making/ A&P Clinical Course as of 07/09/22 2149  Sun Jul 09, 2022  1755 4h tele, ambulatory trial, DC w/ cardiology f/u for poss symptomatic Afib.  [DG]    Clinical Course User Index [DG] Renard Matter, MD                           Medical Decision Making Problems Addressed: Fall, initial encounter: acute illness or injury that poses a threat to life or bodily functions  Amount and/or Complexity of Data Reviewed Independent Historian: spouse External Data Reviewed: labs and notes. Labs: ordered. Decision-making details documented in ED Course. Radiology: ordered and independent interpretation performed. Decision-making details documented in ED Course. ECG/medicine tests: ordered and independent interpretation performed. Decision-making details documented in ED Course.   Daniel Yu is a 88 y.o. male with PMHx of A-fib on Eliquis, CAD, HFpEF, hypertension, mild to moderate MR who presented to the ED by EMS as an activated Level 2 trauma for fall on Eliquis.  On initial examination, patient well-appearing and protecting his airway.  ABCs intact.  Patient has no focal neurologic deficits on exam.  Differential diagnosis includes arrhythmia, acute intracranial pathology, orthostatic syncope, cervical spine fracture dislocation, among others. Patient denies any chest pain or shortness of breath, do not suspect ACS although will obtain EKG to further evaluate.  No shortness of breath, tachycardia, or hypoxia or signs of DVT on exam to suggest PE.  No focal  neurologic deficits on exam to suggest high likelihood of acute intracranial pathology, however will obtain CT scan to further evaluate.  Workup:  EKG on my independent review demonstrates atrial fibrillation per computer read, however it appears to be a regular rhythm and I do think it may see small P waves.  Labs: CBC with hemoglobin 12.7, no significant anemia.  No leukocytosis to suggest significant infection.  CMP with creatinine 1.32, at patient's baseline.  No significant electrolyte derangements.  Glucose within normal limits at  81.  Imaging: Chest x-ray on my independent review demonstrates signs of mild pulmonary edema including small bilateral pleural effusions and diffuse reticular markings.  X-ray of bilateral hips/pelvis without evidence of fracture or dislocation.  CT head on my independent review as well as review by radiology demonstrates likely chronic but technically age-indeterminate infarct in the posterior right frontal lobe.  No acute fracture or dislocation on CT cervical spine.  MRI obtained to further evaluate age of infarct noted on head CT.  This demonstrated no acute intracranial normalities, old right frontal subcortical infarct and findings of chronic small vessel disease.  ED Course:  Patient remained overall well-appearing and hemodynamically stable on multiple serial reassessments.  No episodes of atrial fibrillation noted on the monitor on my review while in the room on several occasions.  Patient denied any episodes of symptoms while here, and states that he feels great and ready for discharge home at this time.  Given overall reassuring work-up today and reassuring serial reassessments, I feel that this is appropriate.  I discussed the importance of following up with cardiology as an outpatient as patient may be periodically going into atrial fibrillation causing his symptoms.  We also discussed following up with his PCP, specifically to discuss his old stroke to  potentially optimize medications.  Patient and his wife were given strict return precautions, and patient was discharged in stable condition.         Final Clinical Impression(s) / ED Diagnoses Final diagnoses:  Fall, initial encounter    Rx / DC Orders ED Discharge Orders     None         Renard Matter, MD 07/09/22 2149    Varney Biles, MD 07/09/22 2355

## 2022-07-10 ENCOUNTER — Telehealth: Payer: Self-pay | Admitting: Cardiology

## 2022-07-10 NOTE — Telephone Encounter (Signed)
Pt c/o BP issue: STAT if pt c/o blurred vision, one-sided weakness or slurred speech  1. What are your last 5 BP readings?  108/55  104/57 107/63   2. Are you having any other symptoms (ex. Dizziness, headache, blurred vision, passed out)? Dizziness  3. What is your BP issue? Pt spouse calling because pt bp has been low. Pt was seen in the ER last night because he passed out. Please advise

## 2022-07-10 NOTE — Telephone Encounter (Signed)
Called an spoke with Jerrel Ivory (spouse) (DPR) pt has been having low BP and dizziness, sometimes feeling like he might pass out. " He has passed out once, he went to the ER at that time yesterday. Her BP machine says that sometimes he has AFIB when she is taking the BP, was 108/59. Denies any other sx no SOB, DOE chest pain. Then his BP will be 118/75. Then today about 130 pm 105/54. No more AFIB today, he does not "feel" when he is in AFIB. Wife states he has been to the hospital a lot in the last 6 weeks. His BP is still, and has been running low. He has not passed out since ER visit. Intermittent BP today has been 108/55, 104/57, 107/63 this was after medication. 118/75, 131/73 before taking his medication today he took his medication at about 8am. Pt is taking all his medication as directed. No swelling, he weighs daily and his weight is good at this time. He will take care changing positions, and wait to walk if he feels dizzy.   At discharge from the ER they were told to make an appt with cardiologist ASAP his appointment 07-27-21. And has a PCP appt PCP 07-12-22. Ok to wait for scheduled appointment.

## 2022-07-11 MED ORDER — LOSARTAN POTASSIUM 50 MG PO TABS: 50.0000 mg | ORAL_TABLET | Freq: Every day | ORAL | 3 refills | Status: DC

## 2022-07-11 NOTE — Addendum Note (Signed)
Addended by: Cristopher Estimable on: 07/11/2022 03:04 PM   Modules accepted: Orders

## 2022-07-11 NOTE — Telephone Encounter (Addendum)
Left detailed message for patients wife to cut the losartan down to 50 mg once daily. He can take 1/2 of the 100 mg tablet he has. New script sent to the pharmacy. She is to call with amy questions.

## 2022-07-11 NOTE — Telephone Encounter (Signed)
Pt informed of providers result & recommendations. Pt verbalized understanding. No further questions . ? ?

## 2022-07-12 DIAGNOSIS — I5189 Other ill-defined heart diseases: Secondary | ICD-10-CM | POA: Diagnosis not present

## 2022-07-12 DIAGNOSIS — I10 Essential (primary) hypertension: Secondary | ICD-10-CM | POA: Diagnosis not present

## 2022-07-12 DIAGNOSIS — I480 Paroxysmal atrial fibrillation: Secondary | ICD-10-CM | POA: Diagnosis not present

## 2022-07-17 ENCOUNTER — Telehealth: Payer: Self-pay

## 2022-07-17 NOTE — Telephone Encounter (Signed)
     Patient  visit on 11/19 at [facility]   Have you been able to follow up with your primary care physician? Yes   The patient was or was not able to obtain any needed medicine or equipment. Yes   Are there diet recommendations that you are having difficulty following? Na   Patient expresses understanding of discharge instructions and education provided has no other needs at this time.  Yes     [place], Care Management  [phone] [address], [place], [place] [address] Phone: [phone] Email: [email or web address]

## 2022-07-17 NOTE — Progress Notes (Signed)
HPI:  Follow-up atrial fibrillation. Patient with history of PAF. Underwent successful cardioversion February 13, 2022 on amiodarone.  Patient subsequently seen in the emergency room on June 30 with complaints of dyspnea and lightheadedness.  Heart rate was in the 40s.  Patient was given IV Lasix with improvement in symptoms.  Cardizem and metoprolol discontinued.  PET scan September 2023 showed 3 times daily with concern for balanced ischemia but perfusion was normal, severe coronary calcification and flow was markedly abnormal suggestive of multivessel disease.  Ejection fraction 42% which decreased to 34% with exercise.  No Dr. Audie Box felt it was also potentially consistent with amyloid.  Cardiac catheterization October 2023 showed 10% left main, 40% LAD and ejection fraction 50 to 55%; left ventricular end-diastolic pressure 24 mmHg.  Echocardiogram October 2023 showed normal LV function, grade 2 diastolic dysfunction, mild biatrial enlargement.  Patient again admitted with CHF October 2023 which improved with diuresis.  Since last seen, he is symptomatically improved.  He denies increased dyspnea on exertion, orthopnea, PND, pedal edema, chest pain or syncope.  Occasional dizziness with standing.  Current Outpatient Medications  Medication Sig Dispense Refill   amiodarone (PACERONE) 200 MG tablet Take 1 tablet (200 mg total) by mouth daily. 90 tablet 3   apixaban (ELIQUIS) 5 MG TABS tablet TAKE 1 TABLET(5 MG) BY MOUTH TWICE DAILY 180 tablet 1   apixaban (ELIQUIS) 5 MG TABS tablet Take 1 tablet (5 mg total) by mouth 2 (two) times daily. 54 tablet 0   Ascorbic Acid (VITAMIN C) 1000 MG tablet Take 1,000 mg by mouth in the morning.     cetirizine (ZYRTEC) 10 MG tablet Take 10 mg by mouth in the morning.     dapagliflozin propanediol (FARXIGA) 10 MG TABS tablet Take 1 tablet (10 mg total) by mouth daily. 30 tablet 11   fluticasone (FLOVENT HFA) 110 MCG/ACT inhaler Inhale 1 puff into the lungs 2 (two)  times daily.      furosemide (LASIX) 20 MG tablet Take 2 tablets (40 mg total) by mouth daily. 180 tablet 3   hydrocortisone cream 1 % Apply topically 4 (four) times daily. 28 g 0   losartan (COZAAR) 50 MG tablet Take 1 tablet (50 mg total) by mouth daily. 90 tablet 3   spironolactone (ALDACTONE) 25 MG tablet Take 1 tablet (25 mg total) by mouth daily. 90 tablet 3   dapagliflozin propanediol (FARXIGA) 10 MG TABS tablet Take 1 tablet (10 mg total) by mouth daily before breakfast. 28 tablet 0   No current facility-administered medications for this visit.     Past Medical History:  Diagnosis Date   Arthritis    right knee oa   Asthma    Carpal tunnel syndrome    BILATERAL   Cholecystitis    Complication of anesthesia    adverse reaction to the spinal   Hypertension    PAF (paroxysmal atrial fibrillation) (HCC)    SBO (small bowel obstruction) ([place]) 12/2015    Past Surgical History:  Procedure Laterality Date   CARDIOVERSION N/A 02/14/2017   Procedure: CARDIOVERSION;  Surgeon: Josue Hector, MD;  Location: [facility];  Service: Cardiovascular;  Laterality: N/A;   CARDIOVERSION N/A 12/11/2019   Procedure: CARDIOVERSION;  Surgeon: Lelon Perla, MD;  Location: [facility];  Service: Cardiovascular;  Laterality: N/A;   CARDIOVERSION N/A 02/13/2022   Procedure: CARDIOVERSION;  Surgeon: Lelon Perla, MD;  Location: [facility];  Service: Cardiovascular;  Laterality: N/A;   CHOLECYSTECTOMY  02/09/2017   CHOLECYSTECTOMY N/A 02/09/2017   Procedure: LAPAROSCOPIC CHOLECYSTECTOMY WITH INTRAOPERATIVE CHOLANGIOGRAM;  Surgeon: Donnie Mesa, MD;  Location: [facility];  Service: General;  Laterality: N/A;   COLONOSCOPY WITH PROPOFOL N/A 11/09/2014   Procedure: COLONOSCOPY WITH PROPOFOL;  Surgeon: Garlan Fair, MD;  [facility];  Service: Endoscopy;  Laterality: N/A;   HERNIA REPAIR     2013   LEFT HEART CATH AND CORONARY ANGIOGRAPHY N/A 05/23/2022   Procedure: LEFT HEART  CATH AND CORONARY ANGIOGRAPHY;  Surgeon: Martinique, Peter M, MD;  Location: [facility];  Service: Cardiovascular;  Laterality: N/A;   QUADRICEPS TENDON REPAIR Left 06/07/2015   Procedure: REPAIR LEFT QUADRICEP TENDON;  Surgeon: Gaynelle Arabian, MD;  [facility];  Service: Orthopedics;  Laterality: Left;   TEE WITHOUT CARDIOVERSION N/A 02/14/2017   Procedure: TRANSESOPHAGEAL ECHOCARDIOGRAM (TEE);  Surgeon: Josue Hector, MD;  Location: [facility] ENDOSCOPY;  Service: Cardiovascular;  Laterality: N/A;   TOTAL KNEE ARTHROPLASTY Right 12/27/2015   Procedure: RIGHT TOTAL KNEE ARTHROPLASTY;  Surgeon: Gaynelle Arabian, MD;  [facility];  Service: Orthopedics;  Laterality: Right;    Social History   Socioeconomic History   Marital status: Married    Spouse name: [name]   Number of children: 2   Years of education: Not on file   Highest education level: Master's degree (e.g., MA, MS, MEng, MEd, MSW, MBA)  Occupational History   Occupation: retired  Tobacco Use   Smoking status: Never   Smokeless tobacco: Never  Vaping Use   Vaping Use: Never used  Substance and Sexual Activity   Alcohol use: Yes    Comment: 1 beer daily   Drug use: No   Sexual activity: Not on file  Other Topics Concern   Not on file  Social History Narrative   Not on file   Social Determinants of Health   Financial Resource Strain: Low Risk  (06/06/2022)   Overall Financial Resource Strain (CARDIA)    Difficulty of Paying Living Expenses: Not hard at all  Food Insecurity: No Food Insecurity (06/06/2022)   Hunger Vital Sign    Worried About Running Out of Food in the Last Year: Never true    Ran Out of Food in the Last Year: Never true  Transportation Needs: No Transportation Needs (06/06/2022)   PRAPARE - Hydrologist (Medical): No    Lack of Transportation (Non-Medical): No  Physical Activity: Not on file  Stress: Not on file  Social Connections: Not on file  Intimate Partner  Violence: Not on file    Family History  Problem Relation Age of Onset   Hypertension Father     ROS: no fevers or chills, productive cough, hemoptysis, dysphasia, odynophagia, melena, hematochezia, dysuria, hematuria, rash, seizure activity, orthopnea, PND, pedal edema, claudication. Remaining systems are negative.  Physical Exam: Well-developed well-nourished in no acute distress.  Skin is warm and dry.  HEENT is normal.  Neck is supple.  Chest is clear to auscultation with normal expansion.  Cardiovascular exam is regular rate and rhythm.  Abdominal exam nontender or distended. No masses palpated. Extremities show no edema. neuro grossly intact   A/P  1 chronic diastolic congestive heart failure-patient appears to be euvolemic on examination.  Will continue diuretics at present dose.   I have reviewed the patient with Dr. Audie Box.  He felt PET scan was concerning for amyloid.  I will arrange a PYP scan, serum and urine immunofixation and urine light  chains to further assess.  May need tafamidis.  2 paroxysmal atrial fibrillation-patient remains in sinus rhythm on examination.  Continue amiodarone at present dose.  Continue apixaban.  3 hypertension-blood pressure was running low at home.  He has also had some orthostatic symptoms.  His blood pressure at present is controlled.  We will allow slightly higher pressure to avoid orthostatic symptoms.  Continue to follow.  4 history of cardiomyopathy-LV function has improved on most recent echocardiogram.  5 coronary artery disease-mild on most recent catheterization.  Will add Crestor 10 mg daily.  Check lipids and liver in 8 weeks.  Kirk Ruths, MD

## 2022-07-26 ENCOUNTER — Ambulatory Visit: Payer: Medicare Other | Admitting: Cardiology

## 2022-07-27 ENCOUNTER — Encounter: Payer: Self-pay | Admitting: Cardiology

## 2022-07-27 ENCOUNTER — Other Ambulatory Visit: Payer: Self-pay | Admitting: Cardiology

## 2022-07-27 ENCOUNTER — Ambulatory Visit: Payer: Medicare Other | Attending: Cardiology | Admitting: Cardiology

## 2022-07-27 VITALS — BP 126/74 | HR 85 | Ht 70.0 in | Wt 191.0 lb

## 2022-07-27 DIAGNOSIS — E7800 Pure hypercholesterolemia, unspecified: Secondary | ICD-10-CM

## 2022-07-27 DIAGNOSIS — I10 Essential (primary) hypertension: Secondary | ICD-10-CM

## 2022-07-27 DIAGNOSIS — R0609 Other forms of dyspnea: Secondary | ICD-10-CM

## 2022-07-27 DIAGNOSIS — I2510 Atherosclerotic heart disease of native coronary artery without angina pectoris: Secondary | ICD-10-CM

## 2022-07-27 MED ORDER — ROSUVASTATIN CALCIUM 10 MG PO TABS: 10.0000 mg | ORAL_TABLET | Freq: Every day | ORAL | 3 refills | Status: DC

## 2022-07-27 NOTE — Patient Instructions (Addendum)
START ROSUVASTATIN 10 MG ONCE DAILY   Your physician recommends that you return for lab work in: [facility]  Testing/Procedures:   [place], you and your health needs are our priority.  As part of our continuing mission to provide you with exceptional heart care, we have created designated Provider Care Teams.  These Care Teams include your primary Cardiologist (physician) and Advanced Practice Providers (APPs -  Physician Assistants and Nurse Practitioners) who all work together to provide you with the care you need, when you need it.  We recommend signing up for the patient portal called "MyChart".  Sign up information is provided on this After Visit Summary.  MyChart is used to connect with patients for Virtual Visits (Telemedicine).  Patients are able to view lab/test results, encounter notes, upcoming appointments, etc.  Non-urgent messages can be sent to your provider as well.   To learn more about what you can do with MyChart, go to [email or web address].    Your next appointment:   3 month(s)  The format for your next appointment:   In Person  Provider:   Kirk Ruths, MD

## 2022-07-28 ENCOUNTER — Telehealth (HOSPITAL_COMMUNITY): Payer: Self-pay | Admitting: *Deleted

## 2022-07-28 NOTE — Telephone Encounter (Signed)
Gave patient a reminder call for appointment for amyloid imaging on 07/31/22 '@12'$ :30

## 2022-07-31 ENCOUNTER — Ambulatory Visit (HOSPITAL_COMMUNITY): Payer: Medicare Other | Attending: Internal Medicine

## 2022-07-31 DIAGNOSIS — R0609 Other forms of dyspnea: Secondary | ICD-10-CM | POA: Diagnosis not present

## 2022-07-31 MED ORDER — TECHNETIUM TC 99M PYROPHOSPHATE
20.1000 | Freq: Once | INTRAVENOUS | Status: AC
  Administered 2022-07-31: 20.1 via INTRAVENOUS

## 2022-08-02 ENCOUNTER — Telehealth: Payer: Self-pay | Admitting: Cardiology

## 2022-08-02 ENCOUNTER — Other Ambulatory Visit: Payer: Self-pay | Admitting: *Deleted

## 2022-08-02 ENCOUNTER — Encounter: Payer: Self-pay | Admitting: *Deleted

## 2022-08-02 DIAGNOSIS — R768 Other specified abnormal immunological findings in serum: Secondary | ICD-10-CM

## 2022-08-02 LAB — IMMUNOFIXATION, SERUM
IgA/Immunoglobulin A, Serum: 339 mg/dL (ref 61–437)
IgG (Immunoglobin G), Serum: 1501 mg/dL (ref 603–1613)
IgM (Immunoglobulin M), Srm: 106 mg/dL (ref 15–143)

## 2022-08-02 LAB — KAPPA/LAMBDA LIGHT CHAINS
Ig Kappa Free Light Chain: 37.1 mg/L — ABNORMAL HIGH (ref 3.3–19.4)
Ig Lambda Free Light Chain: 24.3 mg/L (ref 5.7–26.3)
KAPPA/LAMBDA RATIO: 1.53 (ref 0.26–1.65)

## 2022-08-02 LAB — IMMUNOFIXATION, URINE

## 2022-08-02 NOTE — Telephone Encounter (Signed)
-----  Message from Ladell Pier, MD sent at 08/01/2022  8:27 PM EST ----- Brief look at his labs- mild anemia, new renal insufficieny, kappa lt chains in urine, serum IFE pending I think he needs w/u for myeloma  He could see Dr Irene Limbo at main cancer center or I can see him at [facility]  Let me know and we will get him seen  Leroy Sea ----- Message ----- From: Lelon Perla, MD Sent: 08/01/2022  12:39 PM EST To: Ladell Pier, MD  Michel Santee, Seeing this pt for probable amyloid; PYP scan markedly positive; starting tafamidis. Anything about labs (bence jones proteins in urine) that suggest he might have multiple myeloma or does he need Heme Onc consult for any of this? Sorry to bother you but appreciate your opinion. Kirk Ruths ----- Message ----- From: Lavone Neri Lab Results In Sent: 07/28/2022   6:38 AM EST To: Lelon Perla, MD

## 2022-08-02 NOTE — Progress Notes (Signed)
PATIENT NAVIGATOR PROGRESS NOTE  Name: Daniel Yu Date: 08/02/2022 MRN: [id number]  DOB: [birth year]   Reason for visit:  Introductory phone call  Comments:  Called and spoke with [name] and scheduled for New Patient appointment with Dr Benay Spice on 08/08/22 at 1:40, reviewed directions to building and parking as well as one support person allowed in the appt with him. Verbalized understanding and gave her contact information to call with any questions    Time spent counseling/coordinating care: 30-45 minutes

## 2022-08-02 NOTE — Telephone Encounter (Signed)
I asked Dr. Benay Spice to review labs.  As outlined he feels patient needs evaluation for myeloma.  We will arrange that appointment.  I will hold off on tafamidis until that evaluation is complete.  I will see him back in the next 4 to 6 weeks.  I discussed this with the patient and his wife by phone and they are in agreement. Kirk Ruths, MD

## 2022-08-02 NOTE — Progress Notes (Signed)
Referral entered for Hematology/Oncology

## 2022-08-08 ENCOUNTER — Inpatient Hospital Stay: Payer: Medicare Other

## 2022-08-08 ENCOUNTER — Other Ambulatory Visit: Payer: Self-pay | Admitting: Cardiology

## 2022-08-08 ENCOUNTER — Inpatient Hospital Stay: Payer: Medicare Other | Attending: Oncology | Admitting: Oncology

## 2022-08-08 VITALS — BP 124/64 | HR 79 | Temp 98.1°F | Resp 18 | Ht 70.0 in | Wt 190.6 lb

## 2022-08-08 DIAGNOSIS — I110 Hypertensive heart disease with heart failure: Secondary | ICD-10-CM | POA: Diagnosis not present

## 2022-08-08 DIAGNOSIS — E8582 Wild-type transthyretin-related (ATTR) amyloidosis: Secondary | ICD-10-CM | POA: Diagnosis not present

## 2022-08-08 DIAGNOSIS — R768 Other specified abnormal immunological findings in serum: Secondary | ICD-10-CM | POA: Diagnosis not present

## 2022-08-08 DIAGNOSIS — I5032 Chronic diastolic (congestive) heart failure: Secondary | ICD-10-CM | POA: Diagnosis not present

## 2022-08-08 DIAGNOSIS — I480 Paroxysmal atrial fibrillation: Secondary | ICD-10-CM | POA: Diagnosis not present

## 2022-08-08 DIAGNOSIS — N289 Disorder of kidney and ureter, unspecified: Secondary | ICD-10-CM | POA: Diagnosis not present

## 2022-08-08 LAB — CMP (CANCER CENTER ONLY)
ALT: 25 U/L (ref 0–44)
AST: 23 U/L (ref 15–41)
Albumin: 4.2 g/dL (ref 3.5–5.0)
Alkaline Phosphatase: 74 U/L (ref 38–126)
Anion gap: 9 (ref 5–15)
BUN: 27 mg/dL — ABNORMAL HIGH (ref 8–23)
CO2: 28 mmol/L (ref 22–32)
Calcium: 9.3 mg/dL (ref 8.9–10.3)
Chloride: 98 mmol/L (ref 98–111)
Creatinine: 1.4 mg/dL — ABNORMAL HIGH (ref 0.61–1.24)
GFR, Estimated: 48 mL/min — ABNORMAL LOW (ref >60)
Glucose, Bld: 91 mg/dL (ref 70–99)
Potassium: 4.5 mmol/L (ref 3.5–5.1)
Sodium: 135 mmol/L (ref 135–145)
Total Bilirubin: 0.8 mg/dL (ref 0.3–1.2)
Total Protein: 7.6 g/dL (ref 6.5–8.1)

## 2022-08-08 LAB — CBC WITH DIFFERENTIAL (CANCER CENTER ONLY)
Abs Immature Granulocytes: 0.03 10*3/uL (ref 0.00–0.07)
Basophils Absolute: 0 10*3/uL (ref 0.0–0.1)
Basophils Relative: 1 %
Eosinophils Absolute: 0.1 10*3/uL (ref 0.0–0.5)
Eosinophils Relative: 1 %
HCT: 41.7 % (ref 39.0–52.0)
Hemoglobin: 14.1 g/dL (ref 13.0–17.0)
Immature Granulocytes: 0 %
Lymphocytes Relative: 12 %
Lymphs Abs: 0.9 10*3/uL (ref 0.7–4.0)
MCH: 33.1 pg (ref 26.0–34.0)
MCHC: 33.8 g/dL (ref 30.0–36.0)
MCV: 97.9 fL (ref 80.0–100.0)
Monocytes Absolute: 0.7 10*3/uL (ref 0.1–1.0)
Monocytes Relative: 9 %
Neutro Abs: 5.6 10*3/uL (ref 1.7–7.7)
Neutrophils Relative %: 77 %
Platelet Count: 244 10*3/uL (ref 150–400)
RBC: 4.26 MIL/uL (ref 4.22–5.81)
RDW: 13.1 % (ref 11.5–15.5)
WBC Count: 7.3 10*3/uL (ref 4.0–10.5)
nRBC: 0 % (ref 0.0–0.2)

## 2022-08-08 NOTE — Progress Notes (Unsigned)
[facility] Patient Consult   Requesting MD: Lelon Perla, Md [address],  [place] [id number]   Daniel Yu 88 y.o.  [birth year]    Reason for Consult: Cardiac imaging consistent with amyloidosis   HPI: Daniel Yu has a history of atrial fibrillation.  He underwent a cardioversion 02/13/2022 and is maintained on amiodarone.  He was seen in the emergency room on 01/21/2022 with dyspnea and a heart rate in the 40s.  He was treated with furosemide with improvement.  He underwent a cardiac PET on 05/16/2022 there were changes of ischemia with a rest ejection fraction of 42% with a stress ejection fraction of 34%. A cardiac catheterization on 05/23/2022 revealed an LVEF 50-55%, the proximal ID lesion is 40% stenosed and moderately elevated LV end-diastolic pressure.   An echocardiogram on 06/05/2022 found the LVEF at 5-60% with normal function.  Mild concentric LVH.  Grade 2 diastolic dysfunction.  Mildly reduced right ventricular systolic function.  The left and right atria are mildly dilated.  There is aortic valve sclerosis without aortic stenosis.  He underwent a myocardial amyloid imaging planar and SPECT study on 07/31/2022.  The H/CL ratio is 2.26, suggestive of TTR amyloidosis.  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 Stanford Breed in follow-up.  Laboratory evaluation included mild elevation of the serum free kappa light chains at 37.1, urine immunofixation with kappa Bence-Jones protein, a negative serum and fixation with normal immunoglobulin levels.  On 07/09/2022 the BUN returned at 31, creatinine 1.32, calcium 8.8, albumin 3.2, and total protein 6.4.  The hemoglobin returned at 12.7 MCV of 101.9, platelets 223,000, WBC 6.3, and ANC 4.4.  Daniel Yu is referred for oncology evaluate for cardiac amyloidosis secondary to AL amyloidosis versus transthyretin amyloidosis.  He reports feeling  well. Past Medical History:  Diagnosis Date   Arthritis    right knee oa   Asthma    Carpal tunnel syndrome    BILATERAL   Cholecystitis    Complication of anesthesia    adverse reaction to the spinal   Hypertension    PAF (paroxysmal atrial fibrillation) (HCC)    SBO (small bowel obstruction) ([facility]) 12/2015    Past Surgical History:  Procedure Laterality Date   CARDIOVERSION N/A 02/14/2017   Procedure: CARDIOVERSION;  Surgeon: Josue Hector, MD;  Location: [facility];  Service: Cardiovascular;  Laterality: N/A;   CARDIOVERSION N/A 12/11/2019   Procedure: CARDIOVERSION;  Surgeon: Lelon Perla, MD;  Location: [facility];  Service: Cardiovascular;  Laterality: N/A;   CARDIOVERSION N/A 02/13/2022   Procedure: CARDIOVERSION;  Surgeon: Lelon Perla, MD;  Location[facility] ENDOSCOPY;  Service: Cardiovascular;  Laterality: N/A;   CHOLECYSTECTOMY  02/09/2017   CHOLECYSTECTOMY N/A 02/09/2017   Procedure: LAPAROSCOPIC CHOLECYSTECTOMY WITH INTRAOPERATIVE CHOLANGIOGRAM;  Surgeon: Donnie Mesa, MD;  Location: [facility];  Service: General;  Laterality: N/A;   COLONOSCOPY WITH PROPOFOL N/A 11/09/2014   Procedure: COLONOSCOPY WITH PROPOFOL;  Surgeon: Garlan Fair, MD;  [facility];  Service: Endoscopy;  Laterality: N/A;   HERNIA REPAIR     2013   LEFT HEART CATH AND CORONARY ANGIOGRAPHY N/A 05/23/2022   Procedure: LEFT HEART CATH AND CORONARY ANGIOGRAPHY;  Surgeon: Martinique, Peter M, MD;  Location: [place] CV LAB;  Service: Cardiovascular;  Laterality: N/A;   QUADRICEPS TENDON REPAIR Left 06/07/2015   Procedure: REPAIR LEFT QUADRICEP TENDON;  Surgeon: Gaynelle Arabian, MD;  Location: [facility];  Service: Orthopedics;  Laterality: Left;   TEE WITHOUT CARDIOVERSION N/A 02/14/2017   Procedure: TRANSESOPHAGEAL ECHOCARDIOGRAM (TEE);  Surgeon: Josue Hector, MD;  Location: [facility] ENDOSCOPY;  Service: Cardiovascular;  Laterality: N/A;   TOTAL KNEE ARTHROPLASTY Right 12/27/2015   Procedure:  RIGHT TOTAL KNEE ARTHROPLASTY;  Surgeon: Gaynelle Arabian, MD;  [facility];  Service: Orthopedics;  Laterality: Right;    Medications: Reviewed  Allergies:  Allergies  Allergen Reactions   Asa [Aspirin] Hives   Atorvastatin Hives   Zestril [Lisinopril] Other (See Comments)    Unknown reaction type   Bee Venom Swelling and Rash    Family history: A sister had ovarian cancer.  His father died of "cancer ".  Social History:   He lives with his wife in [place] .  He is retired.  He worked as an Research scientist (life sciences).  He does not use cigarettes.  He has 1 beer per night.  No transfusion history.  No risk factor for HIV or hepatitis.  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  Physical Exam:  Blood pressure 124/64, pulse 79, temperature 98.1 F (36.7 C), temperature source Oral, resp. rate 18, height _0  (1.778 m), weight 190 lb 9.6 oz (86.5 kg), SpO2 100 %.  HEENT: Oropharynx without visible mass, few petechiae at the left buccal mucosa, the tongue appears normal Lungs: Breath sounds, no respiratory distress Cardiac: Regular rate and rhythm Abdomen: No hepatosplenomegaly  Vascular: No leg edema Lymph nodes: No cervical, supraclavicular, axillary, or inguinal nodes Neurologic: Alert and oriented, the motor exam appears intact in the upper and lower extremities bilaterally Skin: Changes of senile purpura at the dorsum of the distal forearm and hands Musculoskeletal: No spine tenderness   LAB:  CBC  Lab Results  Component Value Date   WBC 7.3 08/08/2022   HGB 14.1 08/08/2022   HCT 41.7 08/08/2022   MCV 97.9 08/08/2022   PLT 244 08/08/2022   NEUTROABS 5.6 08/08/2022        CMP  Lab Results  Component Value Date   NA 135 08/08/2022   K 4.5 08/08/2022   CL 98 08/08/2022   CO2 28 08/08/2022   GLUCOSE 91  08/08/2022   BUN 27 (H) 08/08/2022   CREATININE 1.40 (H) 08/08/2022   CALCIUM 9.3 08/08/2022   PROT 7.6 08/08/2022   ALBUMIN 4.2 08/08/2022   AST 23 08/08/2022   ALT 25 08/08/2022   ALKPHOS 74 08/08/2022   BILITOT 0.8 08/08/2022   GFRNONAA 48 (L) 08/08/2022   GFRAA 91 12/02/2019     Imaging: As per HPI      Assessment/Plan:   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  A myocardial amyloid imaging study is consistent with cardiac involvement by amyloidosis.  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  However the serum free kappa light chains are mildly elevated and a urine immunofixation revealed kappa Bence-Jones protein.  The creatinine  is mildly elevated.  He does not have anemia or other clinical findings to suggest a diagnosis of multiple myeloma or systemic amyloidosis.  I recommend additional diagnostic evaluation to to look for evidence of AL amyloidosis.  He will return to the lab for repeat serum free light chains, a serum protein electrophoresis/immunofixation, and 24-hour urine for electrophoresis and immunofixation.  I will refer him for a diagnostic bone marrow biopsy.  Daniel Yu will return for an office visit and further discussion after the bone marrow biopsy.  He will follow-up with cardiology to consider tafamidis and other medical therapy for heart failure if he does not appear to have AL amyloidosis.  Betsy Coder,  MD  08/08/2022, 4:12 PM

## 2022-08-09 DIAGNOSIS — E8582 Wild-type transthyretin-related (ATTR) amyloidosis: Secondary | ICD-10-CM | POA: Diagnosis not present

## 2022-08-09 DIAGNOSIS — I5032 Chronic diastolic (congestive) heart failure: Secondary | ICD-10-CM | POA: Diagnosis not present

## 2022-08-09 DIAGNOSIS — N289 Disorder of kidney and ureter, unspecified: Secondary | ICD-10-CM | POA: Diagnosis not present

## 2022-08-09 DIAGNOSIS — I110 Hypertensive heart disease with heart failure: Secondary | ICD-10-CM | POA: Diagnosis not present

## 2022-08-09 DIAGNOSIS — I480 Paroxysmal atrial fibrillation: Secondary | ICD-10-CM | POA: Diagnosis not present

## 2022-08-09 LAB — KAPPA/LAMBDA LIGHT CHAINS
Kappa free light chain: 34.8 mg/L — ABNORMAL HIGH (ref 3.3–19.4)
Kappa, lambda light chain ratio: 1.58 (ref 0.26–1.65)
Lambda free light chains: 22 mg/L (ref 5.7–26.3)

## 2022-08-10 ENCOUNTER — Other Ambulatory Visit (HOSPITAL_BASED_OUTPATIENT_CLINIC_OR_DEPARTMENT_OTHER): Payer: Self-pay

## 2022-08-10 ENCOUNTER — Telehealth: Payer: Self-pay | Admitting: Cardiology

## 2022-08-10 DIAGNOSIS — R768 Other specified abnormal immunological findings in serum: Secondary | ICD-10-CM

## 2022-08-10 MED ORDER — COVID-19 MRNA VAC-TRIS(PFIZER) 30 MCG/0.3ML IM SUSY
0.3000 mL | PREFILLED_SYRINGE | Freq: Once | INTRAMUSCULAR | 0 refills | Status: AC
  Filled 2022-08-10: qty 0.3, 1d supply, fill #0

## 2022-08-10 NOTE — Telephone Encounter (Signed)
Spoke with pt wife, medications discussed in detail and medication list updated.

## 2022-08-10 NOTE — Telephone Encounter (Signed)
Pt c/o medication issue:  1. Name of Medication: hydrALAZINE (APRESOLINE) 25 MG tablet   2. How are you currently taking this medication (dosage and times per day)?   TAKE 1 TABLET(25 MG) BY MOUTH TWICE DAILY    3. Are you having a reaction (difficulty breathing--STAT)? no  4. What is your medication issue? Wife has question bout this medication. Please advise

## 2022-08-15 ENCOUNTER — Inpatient Hospital Stay: Payer: Medicare Other | Admitting: Licensed Clinical Social Worker

## 2022-08-15 LAB — UIFE/LIGHT CHAINS/TP QN, 24-HR UR
FR KAPPA LT CH,24HR: 47.39 mg/24 hr
FR LAMBDA LT CH,24HR: 4.35 mg/24 hr
Free Kappa Lt Chains,Ur: 32.68 mg/L (ref 1.17–86.46)
Free Kappa/Lambda Ratio: 10.89 (ref 1.83–14.26)
Free Lambda Lt Chains,Ur: 3 mg/L (ref 0.27–15.21)
Total Protein, Urine-Ur/day: 316 mg/24 hr — ABNORMAL HIGH (ref 30–150)
Total Protein, Urine: 21.8 mg/dL
Total Volume: 1450

## 2022-08-15 LAB — MULTIPLE MYELOMA PANEL, SERUM
Albumin SerPl Elph-Mcnc: 3.7 g/dL (ref 2.9–4.4)
Albumin/Glob SerPl: 1.2 (ref 0.7–1.7)
Alpha 1: 0.3 g/dL (ref 0.0–0.4)
Alpha2 Glob SerPl Elph-Mcnc: 0.6 g/dL (ref 0.4–1.0)
B-Globulin SerPl Elph-Mcnc: 0.9 g/dL (ref 0.7–1.3)
Gamma Glob SerPl Elph-Mcnc: 1.4 g/dL (ref 0.4–1.8)
Globulin, Total: 3.2 g/dL (ref 2.2–3.9)
IgA: 327 mg/dL (ref 61–437)
IgG (Immunoglobin G), Serum: 1437 mg/dL (ref 603–1613)
IgM (Immunoglobulin M), Srm: 106 mg/dL (ref 15–143)
Total Protein ELP: 6.9 g/dL (ref 6.0–8.5)

## 2022-08-15 NOTE — Progress Notes (Signed)
[place] Work  Initial Assessment   Daniel Yu is a 88 y.o. year old male contacted by phone. Clinical Social Work was referred by nurse navigator for assessment of psychosocial needs.   SDOH (Social Determinants of Health) assessments performed: Yes SDOH Interventions    Flowsheet Row ED to Hosp-Admission (Discharged) from 06/04/2022 in [facility] Interventions   Food Insecurity Interventions Intervention Not Indicated  Housing Interventions Intervention Not Indicated  Transportation Interventions Intervention Not Indicated  Utilities Interventions Intervention Not Indicated  Alcohol Usage Interventions Intervention Not Indicated (Score <7)  Financial Strain Interventions Intervention Not Indicated       SDOH Screenings   Food Insecurity: No Food Insecurity (06/06/2022)  Housing: Low Risk  (06/06/2022)  Transportation Needs: No Transportation Needs (06/06/2022)  Utilities: Not At Risk (06/06/2022)  Alcohol Screen: Low Risk  (06/06/2022)  Financial Resource Strain: Low Risk  (06/06/2022)  Tobacco Use: Low Risk  (07/27/2022)     Distress Screen completed: No     No data to display            Family/Social Information:  Housing Arrangement: Patient lives with his wife, Daniel Yu. Family members/support persons in your life? Family Transportation concerns: no  Employment: Retired   Income source: Paediatric nurse concerns: No Type of concern: None Food access concerns: no Religious or spiritual practice: Not known Services Currently in place:  [facility] Medicare  Coping/ Adjustment to diagnosis: Patient understands treatment plan and what happens next? yes Concerns about diagnosis and/or treatment: I'm not especially worried about anything Patient reported stressors:  Patient did not identify any. Patient enjoys exercise.  Daniel Yu and his wife are members of [employer].  He enjoyed biking and  weight lifting. Current coping skills/ strengths: Active sense of humor , Average or above average intelligence , Capable of independent living , Communication skills , Financial means , General fund of knowledge , Motivation for treatment/growth , and Supportive family/friends     SUMMARY: Current SDOH Barriers:  None per patient.  Clinical Social Work Clinical Goal(s):  No clinical social work goals at this time  Interventions: Discussed common feeling and emotions when being diagnosed with cancer, and the importance of support during treatment Informed patient of the support team roles and support services at [facility] Provided [place] contact information and encouraged patient to call with any questions or concerns Provided patient with information about the [employer]  He agreed to allow CSW to mail him literature on the programs available.   Follow Up Plan: Patient will contact CSW with any support or resource needs Patient verbalizes understanding of plan: Yes    Rodman Pickle Daniel Jeanbaptiste, LCSW

## 2022-08-22 ENCOUNTER — Inpatient Hospital Stay: Payer: Medicare Other | Admitting: Oncology

## 2022-08-22 NOTE — H&P (Addendum)
Chief Complaint: Patient was seen in consultation today for bone marrow biopsy with aspiration.   Referring Physician(s): Ladell Pier  Supervising Physician: {Supervising Physician:[id number]}  Patient Status: [facility] - Out-pt  History of Present Illness: Daniel Yu is an 89 y.o. male with a medical history significant for chronic diastolic heart failure, renal insufficiency and atrial fibrillation. He underwent cardioversion 02/13/22. Follow up cardiac work up included a myocardial amyloid imaging planar and SPECT study 07/31/22. This showed findings suggestive of amyloidosis. Lab evaluation included mild elevation of the serum free kappa light chains and urine immunofixation positive for Bence-Jones protein.   Interventional Radiology has been asked to evaluate this patient for an image-guided bone marrow biopsy with aspiration for further work up.   Past Medical History:  Diagnosis Date   Arthritis    right knee oa   Asthma    Carpal tunnel syndrome    BILATERAL   Cholecystitis    Complication of anesthesia    adverse reaction to the spinal   Hypertension    PAF (paroxysmal atrial fibrillation) (HCC)    SBO (small bowel obstruction) ([place]) 12/2015    Past Surgical History:  Procedure Laterality Date   CARDIOVERSION N/A 02/14/2017   Procedure: CARDIOVERSION;  Surgeon: Josue Hector, MD;  Location: [place];  Service: Cardiovascular;  Laterality: N/A;   CARDIOVERSION N/A 12/11/2019   Procedure: CARDIOVERSION;  Surgeon: Lelon Perla, MD;  Location: [place];  Service: Cardiovascular;  Laterality: N/A;   CARDIOVERSION N/A 02/13/2022   Procedure: CARDIOVERSION;  Surgeon: Lelon Perla, MD;  Location: [facility] ENDOSCOPY;  Service: Cardiovascular;  Laterality: N/A;   CHOLECYSTECTOMY  02/09/2017   CHOLECYSTECTOMY N/A 02/09/2017   Procedure: LAPAROSCOPIC CHOLECYSTECTOMY WITH INTRAOPERATIVE CHOLANGIOGRAM;  Surgeon: Donnie Mesa, MD;  Location: [place];  Service: General;   Laterality: N/A;   COLONOSCOPY WITH PROPOFOL N/A 11/09/2014   Procedure: COLONOSCOPY WITH PROPOFOL;  Surgeon: Garlan Fair, MD;  Location: [facility] ENDOSCOPY;  Service: Endoscopy;  Laterality: N/A;   HERNIA REPAIR     2013   LEFT HEART CATH AND CORONARY ANGIOGRAPHY N/A 05/23/2022   Procedure: LEFT HEART CATH AND CORONARY ANGIOGRAPHY;  Surgeon: Martinique, Peter M, MD;  Location: [place] CV LAB;  Service: Cardiovascular;  Laterality: N/A;   QUADRICEPS TENDON REPAIR Left 06/07/2015   Procedure: REPAIR LEFT QUADRICEP TENDON;  Surgeon: Gaynelle Arabian, MD;  Location: [facility]S;  Service: Orthopedics;  Laterality: Left;   TEE WITHOUT CARDIOVERSION N/A 02/14/2017   Procedure: TRANSESOPHAGEAL ECHOCARDIOGRAM (TEE);  Surgeon: Josue Hector, MD;  Location: [facility] ENDOSCOPY;  Service: Cardiovascular;  Laterality: N/A;   TOTAL KNEE ARTHROPLASTY Right 12/27/2015   Procedure: RIGHT TOTAL KNEE ARTHROPLASTY;  Surgeon: Gaynelle Arabian, MD;  Location: [facility];  Service: Orthopedics;  Laterality: Right;    Allergies: Asa [aspirin], Atorvastatin, Zestril [lisinopril], and Bee venom  Medications: Prior to Admission medications   Medication Sig Start Date End Date Taking? Authorizing Provider  amiodarone (PACERONE) 200 MG tablet Take 1 tablet (200 mg total) by mouth daily. 06/16/22   Lendon Colonel, NP  apixaban (ELIQUIS) 5 MG TABS tablet TAKE 1 TABLET(5 MG) BY MOUTH TWICE DAILY 06/16/22   Lendon Colonel, NP  Ascorbic Acid (VITAMIN C) 1000 MG tablet Take 1,000 mg by mouth in the morning.    [provider]  cetirizine (ZYRTEC) 10 MG tablet Take 10 mg by mouth in the morning.    [provider]  dapagliflozin propanediol (FARXIGA) 10 MG TABS tablet Take 1  tablet (10 mg total) by mouth daily. 06/16/22   Lendon Colonel, NP  fluticasone (FLOVENT HFA) 110 MCG/ACT inhaler Inhale 1 puff into the lungs 2 (two) times daily.     [provider]  furosemide (LASIX) 20 MG tablet Take 2 tablets (40  mg total) by mouth daily. 06/16/22   Lendon Colonel, NP  hydrocortisone cream 1 % Apply topically 4 (four) times daily. Patient not taking: Reported on 08/08/2022 06/07/22   Margie Billet, PA-C  losartan (COZAAR) 50 MG tablet Take 1 tablet (50 mg total) by mouth daily. 07/11/22   Lelon Perla, MD  rosuvastatin (CRESTOR) 10 MG tablet Take 1 tablet (10 mg total) by mouth daily. 07/27/22   Lelon Perla, MD  spironolactone (ALDACTONE) 25 MG tablet Take 1 tablet (25 mg total) by mouth daily. 06/16/22   Lendon Colonel, NP     Family History  Problem Relation Age of Onset   Hypertension Father     Social History   Socioeconomic History   Marital status: Married    Spouse name: [name]   Number of children: 2   Years of education: Not on file   Highest education level: Master's degree (e.g., MA, MS, MEng, MEd, MSW, MBA)  Occupational History   Occupation: retired  Tobacco Use   Smoking status: Never   Smokeless tobacco: Never  Vaping Use   Vaping Use: Never used  Substance and Sexual Activity   Alcohol use: Yes    Comment: 1 beer daily   Drug use: No   Sexual activity: Not on file  Other Topics Concern   Not on file  Social History Narrative   Not on file   Social Determinants of Health   Financial Resource Strain: Low Risk  (06/06/2022)   Overall Financial Resource Strain (CARDIA)    Difficulty of Paying Living Expenses: Not hard at all  Food Insecurity: No Food Insecurity (06/06/2022)   Hunger Vital Sign    Worried About Running Out of Food in the Last Year: Never true    [place] in the Last Year: Never true  Transportation Needs: No Transportation Needs (06/06/2022)   PRAPARE - Hydrologist (Medical): No    Lack of Transportation (Non-Medical): No  Physical Activity: Not on file  Stress: Not on file  Social Connections: Not on file    Review of Systems: A 12 point ROS discussed and pertinent positives are  indicated in the HPI above.  All other systems are negative.  Review of Systems  Vital Signs: There were no vitals taken for this visit.  Physical Exam  Imaging: MYOCARDIAL AMYLOID IMAGING PLANAR & SPECT  Result Date: 07/31/2022   H/CL ratio is 2.26, strongly suggestive of TTR amyloidosis.   Heart to contralateral lung ratio is greater than 1.5, consistent with cardiac TTR amyloid. By semi-quantitative assessment scan is consistent with heart uptake greater than rib uptake-Grade 3.   Study is strongly suggestive of TTR amyloidosis (visual score of 2-3/ratio >1.5).   Prior study not available for comparison.    Labs:  CBC: Recent Labs    05/19/22 1536 06/04/22 0932 07/09/22 1701 08/08/22 1504  WBC 5.9 7.3 6.3 7.3  HGB 14.4 13.6 12.7* 14.1  HCT 41.3 39.5 36.6* 41.7  PLT 277 275 223 244    COAGS: Recent Labs    07/09/22 1701  INR 1.3*  APTT 33    BMP: Recent Labs  06/07/22 0015 06/13/22 1042 07/09/22 1701 08/08/22 1504  NA 137 134* 136 135  K 3.7 4.5 5.0 4.5  CL 105 102 101 98  CO2 _0 GLUCOSE 91 83 81 91  BUN 29* 28* 31* 27*  CALCIUM 8.3* 8.8* 8.8* 9.3  CREATININE 1.44* 1.42* 1.32* 1.40*  GFRNONAA 47* 48* 52* 48*    LIVER FUNCTION TESTS: Recent Labs    06/05/22 0430 07/09/22 1701 08/08/22 1504  BILITOT 1.3* 0.4 0.8  AST _1 ALT _2 ALKPHOS 55 68 74  PROT 5.8* 6.4* 7.6  ALBUMIN 2.9* 3.2* 4.2    TUMOR MARKERS: No results for input(s): "AFPTM", "CEA", "CA199", "CHROMGRNA" in the last 8760 hours.  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   Risks and benefits of this procedure were discussed with the patient and/or patient's family including, but not limited to bleeding, infection, damage to adjacent structures or  low yield requiring additional tests.  All of the questions were answered and there is agreement to proceed.  Consent signed and in chart.  Thank you for this interesting consult.  I greatly enjoyed meeting Daniel Yu and look forward to participating in their care.  A copy of this report was sent to the requesting provider on this date.  Electronically Signed: Soyla Dryer, AGACNP-BC [phone] 08/22/2022, 11:12 AM   I spent a total of  30 Minutes   in face to face in clinical consultation, greater than 50% of which was counseling/coordinating care for bone marrow biopsy with aspiration.

## 2022-08-23 ENCOUNTER — Other Ambulatory Visit: Payer: Self-pay | Admitting: Radiology

## 2022-08-23 DIAGNOSIS — R768 Other specified abnormal immunological findings in serum: Secondary | ICD-10-CM

## 2022-08-24 ENCOUNTER — Other Ambulatory Visit: Payer: Self-pay

## 2022-08-24 ENCOUNTER — Encounter (HOSPITAL_COMMUNITY): Payer: Self-pay

## 2022-08-24 ENCOUNTER — Ambulatory Visit (HOSPITAL_COMMUNITY)
Admission: RE | Admit: 2022-08-24 | Discharge: 2022-08-24 | Disposition: A | Discharge status: Home / Self Care | Payer: Medicare Other | Source: Ambulatory Visit | Attending: Oncology | Admitting: Oncology

## 2022-08-24 DIAGNOSIS — I480 Paroxysmal atrial fibrillation: Secondary | ICD-10-CM | POA: Insufficient documentation

## 2022-08-24 DIAGNOSIS — R768 Other specified abnormal immunological findings in serum: Secondary | ICD-10-CM

## 2022-08-24 DIAGNOSIS — D72822 Plasmacytosis: Secondary | ICD-10-CM | POA: Insufficient documentation

## 2022-08-24 DIAGNOSIS — Z1379 Encounter for other screening for genetic and chromosomal anomalies: Secondary | ICD-10-CM | POA: Insufficient documentation

## 2022-08-24 DIAGNOSIS — I110 Hypertensive heart disease with heart failure: Secondary | ICD-10-CM | POA: Diagnosis not present

## 2022-08-24 DIAGNOSIS — I5032 Chronic diastolic (congestive) heart failure: Secondary | ICD-10-CM | POA: Diagnosis not present

## 2022-08-24 DIAGNOSIS — D539 Nutritional anemia, unspecified: Secondary | ICD-10-CM | POA: Diagnosis not present

## 2022-08-24 DIAGNOSIS — R803 Bence Jones proteinuria: Secondary | ICD-10-CM | POA: Diagnosis not present

## 2022-08-24 LAB — CBC WITH DIFFERENTIAL/PLATELET
Abs Immature Granulocytes: 0.02 10*3/uL (ref 0.00–0.07)
Basophils Absolute: 0 10*3/uL (ref 0.0–0.1)
Basophils Relative: 1 %
Eosinophils Absolute: 0.2 10*3/uL (ref 0.0–0.5)
Eosinophils Relative: 3 %
HCT: 38.7 % — ABNORMAL LOW (ref 39.0–52.0)
Hemoglobin: 12.9 g/dL — ABNORMAL LOW (ref 13.0–17.0)
Immature Granulocytes: 0 %
Lymphocytes Relative: 17 %
Lymphs Abs: 0.9 10*3/uL (ref 0.7–4.0)
MCH: 33.4 pg (ref 26.0–34.0)
MCHC: 33.3 g/dL (ref 30.0–36.0)
MCV: 100.3 fL — ABNORMAL HIGH (ref 80.0–100.0)
Monocytes Absolute: 0.5 10*3/uL (ref 0.1–1.0)
Monocytes Relative: 9 %
Neutro Abs: 3.6 10*3/uL (ref 1.7–7.7)
Neutrophils Relative %: 70 %
Platelets: 217 10*3/uL (ref 150–400)
RBC: 3.86 MIL/uL — ABNORMAL LOW (ref 4.22–5.81)
RDW: 13.7 % (ref 11.5–15.5)
WBC: 5.2 10*3/uL (ref 4.0–10.5)
nRBC: 0 % (ref 0.0–0.2)

## 2022-08-24 MED ORDER — MIDAZOLAM HCL 2 MG/2ML IJ SOLN
INTRAMUSCULAR | Status: AC
  Filled 2022-08-24: qty 2

## 2022-08-24 MED ORDER — FENTANYL CITRATE (PF) 100 MCG/2ML IJ SOLN
INTRAMUSCULAR | Status: AC | PRN
  Administered 2022-08-24 (×2): 25 ug via INTRAVENOUS
  Administered 2022-08-24: 50 ug via INTRAVENOUS

## 2022-08-24 MED ORDER — FLUMAZENIL 0.5 MG/5ML IV SOLN
INTRAVENOUS | Status: AC
  Filled 2022-08-24: qty 5

## 2022-08-24 MED ORDER — SODIUM CHLORIDE 0.9 % IV SOLN: INTRAVENOUS | Status: DC

## 2022-08-24 MED ORDER — MIDAZOLAM HCL 2 MG/2ML IJ SOLN
INTRAMUSCULAR | Status: AC | PRN
  Administered 2022-08-24 (×2): .5 mg via INTRAVENOUS
  Administered 2022-08-24: 1 mg via INTRAVENOUS

## 2022-08-24 MED ORDER — FENTANYL CITRATE (PF) 100 MCG/2ML IJ SOLN
INTRAMUSCULAR | Status: AC
  Filled 2022-08-24: qty 2

## 2022-08-24 MED ORDER — NALOXONE HCL 0.4 MG/ML IJ SOLN
INTRAMUSCULAR | Status: AC
  Filled 2022-08-24: qty 1

## 2022-08-24 NOTE — Procedures (Signed)
Interventional Radiology Procedure Note  Procedure: CT guided aspirate and core biopsy of right posterior iliac bone Complications: None Recommendations: - Bedrest supine x 1 hrs - OTC's PRN  Pain - Follow biopsy results  Signed,  Katerra Ingman S. Tavia Stave, DO    

## 2022-08-28 ENCOUNTER — Inpatient Hospital Stay: Payer: Medicare Other | Attending: Oncology | Admitting: Oncology

## 2022-08-28 ENCOUNTER — Telehealth: Payer: Self-pay | Admitting: *Deleted

## 2022-08-28 VITALS — BP 122/76 | HR 78 | Temp 98.2°F | Resp 20 | Ht 70.0 in | Wt 192.2 lb

## 2022-08-28 DIAGNOSIS — N289 Disorder of kidney and ureter, unspecified: Secondary | ICD-10-CM | POA: Diagnosis not present

## 2022-08-28 DIAGNOSIS — R768 Other specified abnormal immunological findings in serum: Secondary | ICD-10-CM | POA: Diagnosis not present

## 2022-08-28 DIAGNOSIS — D539 Nutritional anemia, unspecified: Secondary | ICD-10-CM | POA: Diagnosis not present

## 2022-08-28 DIAGNOSIS — E854 Organ-limited amyloidosis: Secondary | ICD-10-CM | POA: Diagnosis present

## 2022-08-28 DIAGNOSIS — I4891 Unspecified atrial fibrillation: Secondary | ICD-10-CM | POA: Insufficient documentation

## 2022-08-28 DIAGNOSIS — I509 Heart failure, unspecified: Secondary | ICD-10-CM | POA: Diagnosis not present

## 2022-08-28 NOTE — Progress Notes (Signed)
  Daniel OFFICE PROGRESS NOTE   Diagnosis: Cardiac amyloidosis  INTERVAL HISTORY:   Daniel. Yu returns as scheduled.  He feels well.  No complaint.  He underwent a bone marrow biopsy on 08/24/2022.  He reports tolerating procedure well.  Objective:  Vital signs in last 24 hours:  Blood pressure 122/76, pulse 78, temperature 98.2 F (36.8 C), resp. rate 20, height '5\' 10"'$  (1.778 m), weight 192 lb 3.2 oz (87.2 kg), SpO2 100 %.    Resp: Fine end inspiratory rales at the right posterior base, no respiratory distress Cardio: Regular rate and rhythm GI: No hepatosplenomegaly Vascular: No leg edema  Skin: Small ecchymosis at the iliac bone marrow biopsy site, no bleeding or evidence of infection  Lab Results:  Lab Results  Component Value Date   WBC 5.2 08/24/2022   HGB 12.9 (L) 08/24/2022   HCT 38.7 (L) 08/24/2022   MCV 100.3 (H) 08/24/2022   PLT 217 08/24/2022   NEUTROABS 3.6 08/24/2022    CMP  Lab Results  Component Value Date   NA 135 08/08/2022   K 4.5 08/08/2022   CL 98 08/08/2022   CO2 28 08/08/2022   GLUCOSE 91 08/08/2022   BUN 27 (H) 08/08/2022   CREATININE 1.40 (H) 08/08/2022   CALCIUM 9.3 08/08/2022   PROT 7.6 08/08/2022   ALBUMIN 4.2 08/08/2022   AST 23 08/08/2022   ALT 25 08/08/2022   ALKPHOS 74 08/08/2022   BILITOT 0.8 08/08/2022   GFRNONAA 48 (L) 08/08/2022   GFRAA 91 12/02/2019    No results found for: "CEA1", "CEA", "IEP329", "CA125"  Lab Results  Component Value Date   INR 1.3 (H) 07/09/2022   LABPROT 16.2 (H) 07/09/2022    Imaging:  No results found.  Medications: I have reviewed the patient's current medications.   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 Urine immunofixation 07/27/2022-positive Bence-Jones  protein-kappa Serum immunofixation 07/27/2022-negative Mild elevation of serum free kappa light chains 07/27/2022 Serum protein electrophoresis/immunofixation 08/08/2022-negative 24-hour urine 08/09/2022-immunofixation negative for a monoclonal protein Bone marrow biopsy 08/24/2022-negative for multiple myeloma, amyloidosis, and lymphoma  History of atrial fibrillation History of congestive heart failure Renal insufficiency Mild macrocytic anemia    Disposition: Daniel Yu has chronic diastolic heart failure and atrial fibrillation.  A myocardial imaging study is consistent with cardiac amyloidosis.  A workup for a systemic plasma cell dyscrasia is negative.  Mild elevation of the serum free kappa light chains is likely related to renal insufficiency.  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  He will follow-up with Dr. Stanford Breed to discuss initiating treatment.  We will follow-up on the final bone marrow biopsy result from 08/24/2022.  I was contacted by pathology today with a preliminary report confirming no evidence of multiple myeloma, amyloid, or lymphoma.  He has mild macrocytic anemia.  The differential diagnosis includes early myelodysplasia.  We will ask Dr. Stanford Breed to check a vitamin B12 level when he is seen next month.  Daniel Yu will return for an office visit and CBC in 4 months.  I will review the peripheral blood smear when he is here in 4 months.  Betsy Coder, MD  08/28/2022  11:47 AM

## 2022-08-28 NOTE — Telephone Encounter (Signed)
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  He is scheduled with you next month you to discuss treatment   He has mild macrocytic anemia.  Will you check a vitamin B12 level with the labs in your office next month? I will see him back in 4 months to follow-up on the anemia  Let me know if there is anything I can do sooner   Thanks,  Leroy Sea

## 2022-08-29 LAB — SURGICAL PATHOLOGY

## 2022-08-31 NOTE — Telephone Encounter (Signed)
Spoke with patient - they will come by the office today to sign paperwork for Vyndamax/Vynalink

## 2022-09-01 ENCOUNTER — Encounter (HOSPITAL_COMMUNITY): Payer: Self-pay | Admitting: Oncology

## 2022-09-07 NOTE — Progress Notes (Signed)
HPI: Follow-up atrial fibrillation. Patient with history of PAF. Underwent successful cardioversion February 13, 2022 on amiodarone.  Patient subsequently seen in the emergency room on June 30 with complaints of dyspnea and lightheadedness.  Heart rate was in the 40s.  Patient was given IV Lasix with improvement in symptoms.  Cardizem and metoprolol discontinued.  PET scan September 2023 showed concern for balanced ischemia but perfusion was normal, severe coronary calcification and flow was markedly abnormal suggestive of multivessel disease.  Ejection fraction 42% which decreased to 34% with exercise.  Note Dr. Audie Box felt it was also potentially consistent with amyloid.  Cardiac catheterization October 2023 showed 10% left main, 40% LAD and ejection fraction 50 to 55%; left ventricular end-diastolic pressure 24 mmHg.  Echocardiogram October 2023 showed normal LV function, grade 2 diastolic dysfunction, mild biatrial enlargement.  Patient again admitted with CHF October 2023 which improved with diuresis.  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  Patient seen by oncology and underwent bone marrow biopsy which did not show multiple myeloma, amyloidosis or lymphoma.  He has been initiated on tafamidis.  Since last seen, the patient denies any dyspnea on exertion, orthopnea, PND, pedal edema, palpitations, syncope or chest pain.   Current Outpatient Medications  Medication Sig Dispense Refill   amiodarone (PACERONE) 200 MG tablet Take 1 tablet (200 mg total) by mouth daily. 90 tablet 3   apixaban (ELIQUIS) 5 MG TABS tablet TAKE 1 TABLET(5 MG) BY MOUTH TWICE DAILY 180 tablet 1   Ascorbic Acid (VITAMIN C) 1000 MG tablet Take 1,000 mg by mouth in the morning.     cetirizine (ZYRTEC) 10 MG tablet Take 10 mg by mouth in the morning.     dapagliflozin propanediol (FARXIGA) 10 MG TABS tablet Take 1 tablet (10 mg total) by mouth daily. 30 tablet 11   fluticasone (FLOVENT HFA) 110 MCG/ACT  inhaler Inhale 1 puff into the lungs 2 (two) times daily.      furosemide (LASIX) 20 MG tablet Take 2 tablets (40 mg total) by mouth daily. 180 tablet 3   hydrocortisone cream 1 % Apply topically 4 (four) times daily. 28 g 0   losartan (COZAAR) 50 MG tablet Take 1 tablet (50 mg total) by mouth daily. 90 tablet 3   rosuvastatin (CRESTOR) 10 MG tablet Take 1 tablet (10 mg total) by mouth daily. 90 tablet 3   spironolactone (ALDACTONE) 25 MG tablet Take 1 tablet (25 mg total) by mouth daily. 90 tablet 3   No current facility-administered medications for this visit.     Past Medical History:  Diagnosis Date   Arthritis    right knee oa   Asthma    Carpal tunnel syndrome    BILATERAL   Cholecystitis    Complication of anesthesia    adverse reaction to the spinal   Hypertension    PAF (paroxysmal atrial fibrillation) (HCC)    SBO (small bowel obstruction) ([place]) 12/2015    Past Surgical History:  Procedure Laterality Date   CARDIOVERSION N/A 02/14/2017   Procedure: CARDIOVERSION;  Surgeon: Josue Hector, MD;  Location: [place];  Service: Cardiovascular;  Laterality: N/A;   CARDIOVERSION N/A 12/11/2019   Procedure: CARDIOVERSION;  Surgeon: Lelon Perla, MD;  Location: [place];  Service: Cardiovascular;  Laterality: N/A;   CARDIOVERSION N/A 02/13/2022   Procedure: CARDIOVERSION;  Surgeon: Lelon Perla, MD;  Location: [place];  Service: Cardiovascular;  Laterality: N/A;   CHOLECYSTECTOMY  02/09/2017  CHOLECYSTECTOMY N/A 02/09/2017   Procedure: LAPAROSCOPIC CHOLECYSTECTOMY WITH INTRAOPERATIVE CHOLANGIOGRAM;  Surgeon: Donnie Mesa, MD;  Location: [place];  Service: General;  Laterality: N/A;   COLONOSCOPY WITH PROPOFOL N/A 11/09/2014   Procedure: COLONOSCOPY WITH PROPOFOL;  Surgeon: Garlan Fair, MD;  Location[facility];  Service: Endoscopy;  Laterality: N/A;   HERNIA REPAIR     2013   LEFT HEART CATH AND CORONARY ANGIOGRAPHY N/A 05/23/2022   Procedure: LEFT  HEART CATH AND CORONARY ANGIOGRAPHY;  Surgeon: Martinique, Peter M, MD;  Location: [place] CV LAB;  Service: Cardiovascular;  Laterality: N/A;   QUADRICEPS TENDON REPAIR Left 06/07/2015   Procedure: REPAIR LEFT QUADRICEP TENDON;  Surgeon: Gaynelle Arabian, MD;  Location[facility];  Service: Orthopedics;  Laterality: Left;   TEE WITHOUT CARDIOVERSION N/A 02/14/2017   Procedure: TRANSESOPHAGEAL ECHOCARDIOGRAM (TEE);  Surgeon: Josue Hector, MD;  Location: [facility] ENDOSCOPY;  Service: Cardiovascular;  Laterality: N/A;   TOTAL KNEE ARTHROPLASTY Right 12/27/2015   Procedure: RIGHT TOTAL KNEE ARTHROPLASTY;  Surgeon: Gaynelle Arabian, MD;  Location: [facility];  Service: Orthopedics;  Laterality: Right;    Social History   Socioeconomic History   Marital status: Married    Spouse name: [name]   Number of children: 2   Years of education: Not on file   Highest education level: Master's degree (e.g., MA, MS, MEng, MEd, MSW, MBA)  Occupational History   Occupation: retired  Tobacco Use   Smoking status: Never   Smokeless tobacco: Never  Vaping Use   Vaping Use: Never used  Substance and Sexual Activity   Alcohol use: Yes    Comment: 1 beer daily   Drug use: No   Sexual activity: Not on file  Other Topics Concern   Not on file  Social History Narrative   Not on file   Social Determinants of Health   Financial Resource Strain: Low Risk  (06/06/2022)   Overall Financial Resource Strain (CARDIA)    Difficulty of Paying Living Expenses: Not hard at all  Food Insecurity: No Food Insecurity (06/06/2022)   Hunger Vital Sign    Worried About Running Out of Food in the Last Year: Never true    Ran Out of Food in the Last Year: Never true  Transportation Needs: No Transportation Needs (06/06/2022)   PRAPARE - Hydrologist (Medical): No    Lack of Transportation (Non-Medical): No  Physical Activity: Not on file  Stress: Not on file  Social Connections: Not on file  Intimate  Partner Violence: Not At Risk (08/15/2022)   Humiliation, Afraid, Rape, and Kick questionnaire    Fear of Current or Ex-Partner: No    Emotionally Abused: No    Physically Abused: No    Sexually Abused: No    Family History  Problem Relation Age of Onset   Hypertension Father     ROS: no fevers or chills, productive cough, hemoptysis, dysphasia, odynophagia, melena, hematochezia, dysuria, hematuria, rash, seizure activity, orthopnea, PND, pedal edema, claudication. Remaining systems are negative.  Physical Exam: Well-developed well-nourished in no acute distress.  Skin is warm and dry.  HEENT is normal.  Neck is supple.  Chest is clear to auscultation with normal expansion.  Cardiovascular exam is regular rate and rhythm.  Abdominal exam nontender or distended. No masses palpated. Extremities show no edema. neuro grossly intact  A/P  1 amyloidosis-PYP scan strongly positive and suggestive of TTR amyloidosis.  He is scheduled to initiate tafamidis.  2 paroxysmal  atrial fibrillation-patient remains in sinus rhythm on exam.  Will continue amiodarone and apixaban.  3 chronic diastolic congestive heart failure-he is euvolemic on examination.  Continue diuretic at present dose.  Check potassium and renal function.  4 hypertension-patient's blood pressure is controlled.  Continue present medical regimen.  Note he has had some orthostatic symptoms in the past.  5 coronary artery disease-mild on previous catheterization.  Continue Crestor.  6 history of cardiomyopathy-LV function improved on most recent echo.  7 history of macrocytic anemia-Dr. Benay Spice requested a B12 level which we will obtain today.  Differential includes early myelodysplasia.  Kirk Ruths, MD

## 2022-09-19 LAB — LIPID PANEL
Chol/HDL Ratio: 1.9 ratio (ref 0.0–5.0)
Cholesterol, Total: 154 mg/dL (ref 100–199)
HDL: 79 mg/dL (ref >39)
LDL Chol Calc (NIH): 61 mg/dL (ref 0–99)
Triglycerides: 73 mg/dL (ref 0–149)
VLDL Cholesterol Cal: 14 mg/dL (ref 5–40)

## 2022-09-19 LAB — HEPATIC FUNCTION PANEL
ALT: 28 IU/L (ref 0–44)
AST: 24 IU/L (ref 0–40)
Albumin: 4.2 g/dL (ref 3.7–4.7)
Alkaline Phosphatase: 85 IU/L (ref 44–121)
Bilirubin Total: 0.5 mg/dL (ref 0.0–1.2)
Bilirubin, Direct: 0.2 mg/dL (ref 0.00–0.40)
Total Protein: 6.8 g/dL (ref 6.0–8.5)

## 2022-09-21 ENCOUNTER — Ambulatory Visit: Payer: Medicare Other | Attending: Cardiology | Admitting: Cardiology

## 2022-09-21 ENCOUNTER — Encounter: Payer: Self-pay | Admitting: Cardiology

## 2022-09-21 VITALS — BP 122/70 | HR 81 | Ht 70.0 in | Wt 193.4 lb

## 2022-09-21 DIAGNOSIS — E7800 Pure hypercholesterolemia, unspecified: Secondary | ICD-10-CM | POA: Diagnosis not present

## 2022-09-21 DIAGNOSIS — I2510 Atherosclerotic heart disease of native coronary artery without angina pectoris: Secondary | ICD-10-CM | POA: Diagnosis not present

## 2022-09-21 DIAGNOSIS — E854 Organ-limited amyloidosis: Secondary | ICD-10-CM | POA: Diagnosis not present

## 2022-09-21 DIAGNOSIS — I480 Paroxysmal atrial fibrillation: Secondary | ICD-10-CM

## 2022-09-21 DIAGNOSIS — I10 Essential (primary) hypertension: Secondary | ICD-10-CM

## 2022-09-21 DIAGNOSIS — I43 Cardiomyopathy in diseases classified elsewhere: Secondary | ICD-10-CM

## 2022-09-21 NOTE — Patient Instructions (Signed)
  Follow-Up: At [facility], you and your health needs are our priority.  As part of our continuing mission to provide you with exceptional heart care, we have created designated Provider Care Teams.  These Care Teams include your primary Cardiologist (physician) and Advanced Practice Providers (APPs -  Physician Assistants and Nurse Practitioners) who all work together to provide you with the care you need, when you need it.  We recommend signing up for the patient portal called "MyChart".  Sign up information is provided on this After Visit Summary.  MyChart is used to connect with patients for Virtual Visits (Telemedicine).  Patients are able to view lab/test results, encounter notes, upcoming appointments, etc.  Non-urgent messages can be sent to your provider as well.   To learn more about what you can do with MyChart, go to [email or web address].    Your next appointment:   3 month(s)  Provider:   Kirk Ruths, MD

## 2022-09-24 LAB — BASIC METABOLIC PANEL
BUN/Creatinine Ratio: 20 (ref 10–24)
BUN: 24 mg/dL (ref 8–27)
CO2: 22 mmol/L (ref 20–29)
Calcium: 9.2 mg/dL (ref 8.6–10.2)
Chloride: 99 mmol/L (ref 96–106)
Creatinine, Ser: 1.22 mg/dL (ref 0.76–1.27)
Glucose: 91 mg/dL (ref 70–99)
Potassium: 4.6 mmol/L (ref 3.5–5.2)
Sodium: 138 mmol/L (ref 134–144)
eGFR: 57 mL/min/{1.73_m2} — ABNORMAL LOW (ref >59)

## 2022-09-24 LAB — VITAMIN B12: Vitamin B-12: 243 pg/mL (ref 232–1245)

## 2022-09-25 ENCOUNTER — Telehealth: Payer: Self-pay | Admitting: Pharmacist Clinician (PhC)/ Clinical Pharmacy Specialist

## 2022-09-25 MED ORDER — VYNDAMAX 61 MG PO CAPS: 61.0000 mg | ORAL_CAPSULE | Freq: Every day | ORAL | 12 refills | Status: DC

## 2022-09-25 NOTE — Telephone Encounter (Signed)
Spoke with wife.  She was told copay at [facility] would be $2691/ month.  (Also had message from [facility] stating he was covered at 100% and copay would be $0.    Set up patient with [place] for $10,000, good to 08/26/23  ID   [id number] BIN   [id number] PCN  [id number] GRP  [id number]

## 2022-10-10 ENCOUNTER — Other Ambulatory Visit (HOSPITAL_COMMUNITY): Payer: Self-pay

## 2022-10-10 NOTE — Telephone Encounter (Signed)
Spoke with patient.  He was trying to fill out reimbursement claim for [facility].  Explained not needed as he paid $0 OOP.  Pt voiced understanding.

## 2022-10-10 NOTE — Telephone Encounter (Signed)
Patient wants call back to discuss paperwork is completed correctly.

## 2022-10-17 ENCOUNTER — Emergency Department (HOSPITAL_COMMUNITY)
Admission: EM | Admit: 2022-10-17 | Discharge: 2022-10-17 | Disposition: A | Discharge status: Home / Self Care | Payer: Medicare Other | Attending: Emergency Medicine | Admitting: Emergency Medicine

## 2022-10-17 ENCOUNTER — Other Ambulatory Visit: Payer: Self-pay

## 2022-10-17 DIAGNOSIS — N189 Chronic kidney disease, unspecified: Secondary | ICD-10-CM | POA: Insufficient documentation

## 2022-10-17 DIAGNOSIS — R319 Hematuria, unspecified: Secondary | ICD-10-CM | POA: Diagnosis present

## 2022-10-17 DIAGNOSIS — Z7901 Long term (current) use of anticoagulants: Secondary | ICD-10-CM | POA: Insufficient documentation

## 2022-10-17 LAB — URINALYSIS, MICROSCOPIC (REFLEX): RBC / HPF: >50 RBC/hpf (ref 0–5)

## 2022-10-17 LAB — CBC WITH DIFFERENTIAL/PLATELET
Abs Immature Granulocytes: 0.01 10*3/uL (ref 0.00–0.07)
Basophils Absolute: 0 10*3/uL (ref 0.0–0.1)
Basophils Relative: 1 %
Eosinophils Absolute: 0.1 10*3/uL (ref 0.0–0.5)
Eosinophils Relative: 2 %
HCT: 39 % (ref 39.0–52.0)
Hemoglobin: 13.6 g/dL (ref 13.0–17.0)
Immature Granulocytes: 0 %
Lymphocytes Relative: 15 %
Lymphs Abs: 0.9 10*3/uL (ref 0.7–4.0)
MCH: 36.2 pg — ABNORMAL HIGH (ref 26.0–34.0)
MCHC: 34.9 g/dL (ref 30.0–36.0)
MCV: 103.7 fL — ABNORMAL HIGH (ref 80.0–100.0)
Monocytes Absolute: 0.6 10*3/uL (ref 0.1–1.0)
Monocytes Relative: 10 %
Neutro Abs: 4.5 10*3/uL (ref 1.7–7.7)
Neutrophils Relative %: 72 %
Platelets: 257 10*3/uL (ref 150–400)
RBC: 3.76 MIL/uL — ABNORMAL LOW (ref 4.22–5.81)
RDW: 12.1 % (ref 11.5–15.5)
WBC: 6.1 10*3/uL (ref 4.0–10.5)
nRBC: 0 % (ref 0.0–0.2)

## 2022-10-17 LAB — URINALYSIS, ROUTINE W REFLEX MICROSCOPIC

## 2022-10-17 LAB — BASIC METABOLIC PANEL
Anion gap: 9 (ref 5–15)
BUN: 25 mg/dL — ABNORMAL HIGH (ref 8–23)
CO2: 25 mmol/L (ref 22–32)
Calcium: 8.8 mg/dL — ABNORMAL LOW (ref 8.9–10.3)
Chloride: 99 mmol/L (ref 98–111)
Creatinine, Ser: 1.43 mg/dL — ABNORMAL HIGH (ref 0.61–1.24)
GFR, Estimated: 47 mL/min — ABNORMAL LOW (ref >60)
Glucose, Bld: 103 mg/dL — ABNORMAL HIGH (ref 70–99)
Potassium: 4.5 mmol/L (ref 3.5–5.1)
Sodium: 133 mmol/L — ABNORMAL LOW (ref 135–145)

## 2022-10-17 NOTE — ED Provider Triage Note (Signed)
Emergency Medicine Provider Triage Evaluation Note  DAVIES BRAKEFIELD , a 89 y.o. male  was evaluated in triage.  Pt complains of 2 episodes of hematuria.  On NOAC for A-fib.  No dysuria.  No back pain, history of kidney stone.  Review of Systems  Positive:  Negative:   Physical Exam  BP 102/68   Pulse 81   Temp 98 F (36.7 C)   Resp 16   SpO2 97%  Gen:   Awake, no distress   Resp:  Normal effort  MSK:   Moves extremities without difficulty  Other:  Negative CVA tenderness  Medical Decision Making  Medically screening exam initiated at 4:48 PM.  Appropriate orders placed.  [name] was informed that the remainder of the evaluation will be completed by another provider, this initial triage assessment does not replace that evaluation, and the importance of remaining in the ED until their evaluation is complete.     Rhae Hammock, [place] 10/17/22 1649

## 2022-10-17 NOTE — ED Provider Notes (Signed)
[facility] Provider Note   CSN: [id number] Arrival date & time: 10/17/22  1622     History  Chief Complaint  Patient presents with   Hematuria    Daniel Yu is a 89 y.o. male.  89 year old male with history of paroxysmal A-fib on Eliquis presents with 1 day of hematuria.  Denies any dysuria or flank pain or fever.  Denies any suprapubic pressure.  No prior history of bladder disease.  Symptoms persistent and no treatment use prior to arrival       Home Medications Prior to Admission medications   Medication Sig Start Date End Date Taking? Authorizing Provider  amiodarone (PACERONE) 200 MG tablet Take 1 tablet (200 mg total) by mouth daily. 06/16/22   Lendon Colonel, NP  apixaban (ELIQUIS) 5 MG TABS tablet TAKE 1 TABLET(5 MG) BY MOUTH TWICE DAILY 06/16/22   Lendon Colonel, NP  Ascorbic Acid (VITAMIN C) 1000 MG tablet Take 1,000 mg by mouth in the morning.    [provider]  cetirizine (ZYRTEC) 10 MG tablet Take 10 mg by mouth in the morning.    [provider]  dapagliflozin propanediol (FARXIGA) 10 MG TABS tablet Take 1 tablet (10 mg total) by mouth daily. 06/16/22   Lendon Colonel, NP  fluticasone (FLOVENT HFA) 110 MCG/ACT inhaler Inhale 1 puff into the lungs 2 (two) times daily.     [provider]  furosemide (LASIX) 20 MG tablet Take 2 tablets (40 mg total) by mouth daily. 06/16/22   Lendon Colonel, NP  hydrocortisone cream 1 % Apply topically 4 (four) times daily. 06/07/22   Margie Billet, PA-C  losartan (COZAAR) 50 MG tablet Take 1 tablet (50 mg total) by mouth daily. 07/11/22   Lelon Perla, MD  rosuvastatin (CRESTOR) 10 MG tablet Take 1 tablet (10 mg total) by mouth daily. 07/27/22   Lelon Perla, MD  spironolactone (ALDACTONE) 25 MG tablet Take 1 tablet (25 mg total) by mouth daily. 06/16/22   Lendon Colonel, NP  Tafamidis (VYNDAMAX) 61 MG CAPS Take 1  capsule (61 mg total) by mouth daily. 09/25/22   Lelon Perla, MD      Allergies    Asa [aspirin], Atorvastatin, Zestril [lisinopril], and Bee venom    Review of Systems   Review of Systems  All other systems reviewed and are negative.   Physical Exam Updated Vital Signs BP (!) 153/76 (BP Location: Right Arm)   Pulse 60   Temp 97.9 F (36.6 C) (Oral)   Resp (!) 27   SpO2 98%  Physical Exam Vitals and nursing note reviewed.  Constitutional:      General: He is not in acute distress.    Appearance: Normal appearance. He is well-developed. He is not toxic-appearing.  HENT:     Head: Normocephalic and atraumatic.  Eyes:     General: Lids are normal.     Conjunctiva/sclera: Conjunctivae normal.     Pupils: Pupils are equal, round, and reactive to light.  Neck:     Thyroid: No thyroid mass.     Trachea: No tracheal deviation.  Cardiovascular:     Rate and Rhythm: Normal rate and regular rhythm.     Heart sounds: Normal heart sounds. No murmur heard.    No gallop.  Pulmonary:     Effort: Pulmonary effort is normal. No respiratory distress.     Breath sounds: Normal breath sounds. No stridor.  No decreased breath sounds, wheezing, rhonchi or rales.  Abdominal:     General: There is no distension.     Palpations: Abdomen is soft.     Tenderness: There is no abdominal tenderness. There is no rebound.  Musculoskeletal:        General: No tenderness. Normal range of motion.     Cervical back: Normal range of motion and neck supple.  Skin:    General: Skin is warm and dry.     Findings: No abrasion or rash.  Neurological:     Mental Status: He is alert and oriented to person, place, and time. Mental status is at baseline.     GCS: GCS eye subscore is 4. GCS verbal subscore is 5. GCS motor subscore is 6.     Cranial Nerves: No cranial nerve deficit.     Sensory: No sensory deficit.     Motor: Motor function is intact.  Psychiatric:        Attention and Perception:  Attention normal.        Speech: Speech normal.        Behavior: Behavior normal.     ED Results / Procedures / Treatments   Labs (all labs ordered are listed, but only abnormal results are displayed) Labs Reviewed  CBC WITH DIFFERENTIAL/PLATELET - Abnormal; Notable for the following components:      Result Value   RBC 3.76 (*)    MCV 103.7 (*)    MCH 36.2 (*)    All other components within normal limits  BASIC METABOLIC PANEL - Abnormal; Notable for the following components:   Sodium 133 (*)    Glucose, Bld 103 (*)    BUN 25 (*)    Creatinine, Ser 1.43 (*)    Calcium 8.8 (*)    GFR, Estimated 47 (*)    All other components within normal limits  URINALYSIS, ROUTINE W REFLEX MICROSCOPIC - Abnormal; Notable for the following components:   Color, Urine RED (*)    APPearance BLOODY (*)    Glucose, UA   (*)    Value: TEST NOT REPORTED DUE TO COLOR INTERFERENCE OF URINE PIGMENT   Hgb urine dipstick   (*)    Value: TEST NOT REPORTED DUE TO COLOR INTERFERENCE OF URINE PIGMENT   Bilirubin Urine   (*)    Value: TEST NOT REPORTED DUE TO COLOR INTERFERENCE OF URINE PIGMENT   Ketones, ur   (*)    Value: TEST NOT REPORTED DUE TO COLOR INTERFERENCE OF URINE PIGMENT   Protein, ur   (*)    Value: TEST NOT REPORTED DUE TO COLOR INTERFERENCE OF URINE PIGMENT   Nitrite   (*)    Value: TEST NOT REPORTED DUE TO COLOR INTERFERENCE OF URINE PIGMENT   Leukocytes,Ua   (*)    Value: TEST NOT REPORTED DUE TO COLOR INTERFERENCE OF URINE PIGMENT   All other components within normal limits  URINALYSIS, MICROSCOPIC (REFLEX) - Abnormal; Notable for the following components:   Bacteria, UA   (*)    Value: TEST NOT REPORTED DUE TO COLOR INTERFERENCE OF URINE PIGMENT   All other components within normal limits    EKG None  Radiology No results found.  Procedures Procedures    Medications Ordered in ED Medications - No data to display  ED Course/ Medical Decision Making/ A&P  Medical Decision Making  Patient is urinalysis shows copious amounts of red blood cells.  His creatinine is slightly elevated but he has chronic kidney disease.  This is not grossly different from prior readings.  Patient is on Eliquis and likely this is the cause of his bleeding.  Consideration for bladder cancer should also be entertained.  Patient has no evidence of urethral obstruction.  Plan will be to give patient referral to urology        Final Clinical Impression(s) / ED Diagnoses Final diagnoses:  None    Rx / DC Orders ED Discharge Orders     None         Lacretia Leigh, MD 10/17/22 2019

## 2022-10-17 NOTE — ED Triage Notes (Signed)
Pt on Eliquis here for eval of two episodes of hematuria since 1230 today. Denies pain with urination, flank pain, or trauma to scrotum.

## 2022-10-18 ENCOUNTER — Telehealth: Payer: Self-pay | Admitting: Cardiology

## 2022-10-18 NOTE — Telephone Encounter (Signed)
Yesterday pt went to the ED due to seeing blood in his urine. Per ED note:  Patient is urinalysis shows copious amounts of red blood cells.  His creatinine is slightly elevated but he has chronic kidney disease.  This is not grossly different from prior readings.  Patient is on Eliquis and likely this is the cause of his bleeding.  Consideration for bladder cancer should also be entertained.  Patient has no evidence of urethral obstruction.  Plan will be to give patient referral to urology    Pt does have an appointment with [facility] tomorrow. Wife asked if the patient should stop taking eliquis. Will forward to MD and nurse.

## 2022-10-18 NOTE — Telephone Encounter (Signed)
Pt c/o medication issue:  1. Name of Medication: Eliquis  2. How are you currently taking this medication (dosage and times per day)?   3. Are you having a reaction (difficulty breathing--STAT)?   4. What is your medication issue? Blood in his urine(dark red blood)- he needs to know what to do

## 2022-10-18 NOTE — Telephone Encounter (Signed)
Spoke with pt wife, Aware of dr crenshaw's recommendations.  

## 2022-10-19 ENCOUNTER — Telehealth: Payer: Self-pay | Admitting: Cardiology

## 2022-10-19 NOTE — Telephone Encounter (Signed)
Wife called stating patient saw Dr. Glynn Octave the urologist today, they did a urine and blood sample, and a bladder scan. On 3/5 patient is going to have a CT scan of his kidneys and probably the bladder as well.  On 3/11 a follow up visit with Dr. Glynn Octave.  Patient is currently off of Eliquis, and Dr. Glynn Octave wants him to stay of of it until his office visit on 3/11.  Wife is concerned because that seems to be a long time to be off of Eliquis, she would like Dr. Jacalyn Lefevre opinion on patient being off of Eliquis for that long.

## 2022-10-20 ENCOUNTER — Other Ambulatory Visit: Payer: Self-pay | Admitting: Pharmacist Clinician (PhC)/ Clinical Pharmacy Specialist

## 2022-10-20 ENCOUNTER — Ambulatory Visit: Payer: Medicare Other | Admitting: Adult Health

## 2022-10-20 MED ORDER — FARXIGA 10 MG PO TABS: 10.0000 mg | ORAL_TABLET | Freq: Every day | ORAL | 3 refills | Status: DC

## 2022-10-20 NOTE — Telephone Encounter (Signed)
Spoke with pt wife, Aware of dr crenshaw's recommendations.  

## 2022-12-19 NOTE — Progress Notes (Signed)
HPI: Follow-up atrial fibrillation and amyloid. Patient with history of PAF. Underwent successful cardioversion February 13, 2022 on amiodarone.  Patient subsequently seen in the emergency room on June 30 with complaints of dyspnea and lightheadedness.  Heart rate was in the 40s.  Patient was given IV Lasix with improvement in symptoms.  Cardizem and metoprolol discontinued.  PET scan September 2023 showed concern for balanced ischemia but perfusion was normal, severe coronary calcification and flow was markedly abnormal suggestive of multivessel disease.  Ejection fraction 42% which decreased to 34% with exercise.  Note Dr. Flora Lipps felt it was also potentially consistent with amyloid.  Cardiac catheterization October 2023 showed 10% left main, 40% LAD and ejection fraction 50 to 55%; left ventricular end-diastolic pressure 24 mmHg.  Echocardiogram October 2023 showed normal LV function, grade 2 diastolic dysfunction, mild biatrial enlargement.  Patient again admitted with CHF October 2023 which improved with diuresis.  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  Patient seen by oncology and underwent bone marrow biopsy which did not show multiple myeloma, amyloidosis or lymphoma.  He has been initiated on tafamidis.  Since last seen, he denies chest pain, pedal edema, palpitations or syncope.  He does have mild dyspnea on exertion.  Current Outpatient Medications  Medication Sig Dispense Refill   amiodarone (PACERONE) 200 MG tablet Take 1 tablet (200 mg total) by mouth daily. 90 tablet 3   apixaban (ELIQUIS) 5 MG TABS tablet TAKE 1 TABLET(5 MG) BY MOUTH TWICE DAILY 180 tablet 1   Ascorbic Acid (VITAMIN C) 1000 MG tablet Take 1,000 mg by mouth in the morning.     cetirizine (ZYRTEC) 10 MG tablet Take 10 mg by mouth in the morning.     FARXIGA 10 MG TABS tablet Take 1 tablet (10 mg total) by mouth daily before breakfast. 90 tablet 3   Fluticasone Furoate (ARNUITY ELLIPTA) 100 MCG/ACT  AEPB 1 puff Inhalation twice daily for 30 days     furosemide (LASIX) 20 MG tablet Take 2 tablets (40 mg total) by mouth daily. 180 tablet 3   hydrocortisone cream 1 % Apply topically 4 (four) times daily. 28 g 0   losartan (COZAAR) 50 MG tablet Take 1 tablet (50 mg total) by mouth daily. 90 tablet 3   rosuvastatin (CRESTOR) 10 MG tablet Take 1 tablet (10 mg total) by mouth daily. 90 tablet 3   spironolactone (ALDACTONE) 25 MG tablet Take 1 tablet (25 mg total) by mouth daily. 90 tablet 3   Tafamidis (VYNDAMAX) 61 MG CAPS Take 1 capsule (61 mg total) by mouth daily. 30 capsule 12   No current facility-administered medications for this visit.     Past Medical History:  Diagnosis Date   Arthritis    right knee oa   Asthma    Carpal tunnel syndrome    BILATERAL   Cholecystitis    Complication of anesthesia    adverse reaction to the spinal   Hypertension    PAF (paroxysmal atrial fibrillation) (HCC)    SBO (small bowel obstruction) (HCC) 12/2015    Past Surgical History:  Procedure Laterality Date   CARDIOVERSION N/A 02/14/2017   Procedure: CARDIOVERSION;  Surgeon: Wendall Stade, MD;  Location: [facility] ENDOSCOPY;  Service: Cardiovascular;  Laterality: N/A;   CARDIOVERSION N/A 12/11/2019   Procedure: CARDIOVERSION;  Surgeon: Lewayne Bunting, MD;  Location: [facility] ENDOSCOPY;  Service: Cardiovascular;  Laterality: N/A;   CARDIOVERSION N/A 02/13/2022   Procedure: CARDIOVERSION;  Surgeon: Jens Som,  Madolyn Frieze, MD;  Location: [facility] ENDOSCOPY;  Service: Cardiovascular;  Laterality: N/A;   CHOLECYSTECTOMY  02/09/2017   CHOLECYSTECTOMY N/A 02/09/2017   Procedure: LAPAROSCOPIC CHOLECYSTECTOMY WITH INTRAOPERATIVE CHOLANGIOGRAM;  Surgeon: Manus Rudd, MD;  Location: [facility] OR;  Service: General;  Laterality: N/A;   COLONOSCOPY WITH PROPOFOL N/A 11/09/2014   Procedure: COLONOSCOPY WITH PROPOFOL;  Surgeon: Charolett Bumpers, MD;  Location: [facility] ENDOSCOPY;  Service: Endoscopy;  Laterality: N/A;   HERNIA REPAIR      2013   LEFT HEART CATH AND CORONARY ANGIOGRAPHY N/A 05/23/2022   Procedure: LEFT HEART CATH AND CORONARY ANGIOGRAPHY;  Surgeon: Swazil[place], Peter M, MD;  Location: [facility] INVASIVE CV LAB;  Service: Cardiovascular;  Laterality: N/A;   QUADRICEPS TENDON REPAIR Left 06/07/2015   Procedure: REPAIR LEFT QUADRICEP TENDON;  Surgeon: Ollen Gross, MD;  Location: [facility] ORS;  Service: Orthopedics;  Laterality: Left;   TEE WITHOUT CARDIOVERSION N/A 02/14/2017   Procedure: TRANSESOPHAGEAL ECHOCARDIOGRAM (TEE);  Surgeon: Wendall Stade, MD;  Location: [facility] ENDOSCOPY;  Service: Cardiovascular;  Laterality: N/A;   TOTAL KNEE ARTHROPLASTY Right 12/27/2015   Procedure: RIGHT TOTAL KNEE ARTHROPLASTY;  Surgeon: Ollen Gross, MD;  Location: [facility] ORS;  Service: Orthopedics;  Laterality: Right;    Social History   Socioeconomic History   Marital status: Married    Spouse name: Eber Jones   Number of children: 2   Years of education: Not on file   Highest education level: Master's degree (e.g., MA, MS, MEng, MEd, MSW, MBA)  Occupational History   Occupation: retired  Tobacco Use   Smoking status: Never   Smokeless tobacco: Never  Vaping Use   Vaping Use: Never used  Substance and Sexual Activity   Alcohol use: Yes    Comment: 1 beer daily   Drug use: No   Sexual activity: Not on file  Other Topics Concern   Not on file  Social History Narrative   Not on file   Social Determinants of Health   Financial Resource Strain: Low Risk  (06/06/2022)   Overall Financial Resource Strain (CARDIA)    Difficulty of Paying Living Expenses: Not hard at all  Food Insecurity: No Food Insecurity (06/06/2022)   Hunger Vital Sign    Worried About Running Out of Food in the Last Year: Never true    Ran Out of Food in the Last Year: Never true  Transportation Needs: No Transportation Needs (06/06/2022)   PRAPARE - Administrator, Civil Service (Medical): No    Lack of Transportation (Non-Medical): No  Physical Activity:  Not on file  Stress: Not on file  Social Connections: Not on file  Intimate Partner Violence: Not At Risk (08/15/2022)   Humiliation, Afraid, Rape, and Kick questionnaire    Fear of Current or Ex-Partner: No    Emotionally Abused: No    Physically Abused: No    Sexually Abused: No    Family History  Problem Relation Age of Onset   Hypertension Father     ROS: no fevers or chills, productive cough, hemoptysis, dysphasia, odynophagia, melena, hematochezia, dysuria, hematuria, rash, seizure activity, orthopnea, PND, pedal edema, claudication. Remaining systems are negative.  Physical Exam: Well-developed well-nourished in no acute distress.  Skin is warm and dry.  HEENT is normal.  Neck is supple.  Chest is clear to auscultation with normal expansion.  Cardiovascular exam is regular rate and rhythm.  Abdominal exam nontender or distended. No masses palpated. Extremities show no edema. neuro grossly intact  ECG-normal sinus  rhythm at a rate of 67, no ST changes.  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  Continue tafamidis.  2 paroxysmal atrial fibrillation-patient is in sinus rhythm today.  Will continue amiodarone and apixaban.  Check hemoglobin, renal function, TSH and liver functions.  Will check chest x-ray when he returns in 6 months.  3 chronic diastolic congestive heart failure-patient remains euvolemic.  Will continue diuretic.  Continue Farxiga.  Check potassium and renal function.  4 hypertension-blood pressure controlled.  Continue present medications.  5 coronary artery disease-mild on prior catheterization.  Continue statin.  6 history of cardiomyopathy-LV function improved on most recent echocardiogram.  Olga Millers, MD

## 2022-12-26 DIAGNOSIS — Z961 Presence of intraocular lens: Secondary | ICD-10-CM | POA: Diagnosis not present

## 2022-12-27 ENCOUNTER — Encounter: Payer: Self-pay | Admitting: Cardiology

## 2022-12-27 ENCOUNTER — Ambulatory Visit: Payer: Medicare Other | Attending: Cardiology | Admitting: Cardiology

## 2022-12-27 VITALS — BP 118/76 | HR 67 | Ht 70.0 in | Wt 192.2 lb

## 2022-12-27 DIAGNOSIS — I480 Paroxysmal atrial fibrillation: Secondary | ICD-10-CM | POA: Diagnosis not present

## 2022-12-27 NOTE — Patient Instructions (Signed)
  Lab Work:  Your physician recommends that you return for lab work with DR Truett Perna  If you have labs (blood work) drawn today and your tests are completely normal, you will receive your results only by: MyChart Message (if you have MyChart) OR A paper copy in the mail If you have any lab test that is abnormal or we need to change your treatment, we will call you to review the results.   Follow-Up: At [facility], you and your health needs are our priority.  As part of our continuing mission to provide you with exceptional heart care, we have created designated Provider Care Teams.  These Care Teams include your primary Cardiologist (physician) and Advanced Practice Providers (APPs -  Physician Assistants and Nurse Practitioners) who all work together to provide you with the care you need, when you need it.  We recommend signing up for the patient portal called "MyChart".  Sign up information is provided on this After Visit Summary.  MyChart is used to connect with patients for Virtual Visits (Telemedicine).  Patients are able to view lab/test results, encounter notes, upcoming appointments, etc.  Non-urgent messages can be sent to your provider as well.   To learn more about what you can do with MyChart, go to [email or web address].    Your next appointment:   6 month(s)  Provider:   Olga Millers, MD

## 2022-12-28 ENCOUNTER — Inpatient Hospital Stay: Payer: Medicare Other | Attending: Oncology

## 2022-12-28 ENCOUNTER — Inpatient Hospital Stay: Payer: Medicare Other | Admitting: Oncology

## 2022-12-28 VITALS — BP 118/69 | HR 71 | Temp 98.2°F | Resp 18 | Ht 70.0 in | Wt 190.4 lb

## 2022-12-28 DIAGNOSIS — E854 Organ-limited amyloidosis: Secondary | ICD-10-CM | POA: Insufficient documentation

## 2022-12-28 DIAGNOSIS — D539 Nutritional anemia, unspecified: Secondary | ICD-10-CM | POA: Insufficient documentation

## 2022-12-28 DIAGNOSIS — I4891 Unspecified atrial fibrillation: Secondary | ICD-10-CM | POA: Diagnosis not present

## 2022-12-28 DIAGNOSIS — R768 Other specified abnormal immunological findings in serum: Secondary | ICD-10-CM

## 2022-12-28 DIAGNOSIS — D7589 Other specified diseases of blood and blood-forming organs: Secondary | ICD-10-CM | POA: Diagnosis not present

## 2022-12-28 DIAGNOSIS — I509 Heart failure, unspecified: Secondary | ICD-10-CM | POA: Diagnosis not present

## 2022-12-28 DIAGNOSIS — R310 Gross hematuria: Secondary | ICD-10-CM | POA: Insufficient documentation

## 2022-12-28 DIAGNOSIS — I480 Paroxysmal atrial fibrillation: Secondary | ICD-10-CM | POA: Diagnosis not present

## 2022-12-28 DIAGNOSIS — N289 Disorder of kidney and ureter, unspecified: Secondary | ICD-10-CM | POA: Diagnosis not present

## 2022-12-28 LAB — CBC WITH DIFFERENTIAL (CANCER CENTER ONLY)
Abs Immature Granulocytes: 0.02 10*3/uL (ref 0.00–0.07)
Basophils Absolute: 0.1 10*3/uL (ref 0.0–0.1)
Basophils Relative: 1 %
Eosinophils Absolute: 0.2 10*3/uL (ref 0.0–0.5)
Eosinophils Relative: 3 %
HCT: 43.2 % (ref 39.0–52.0)
Hemoglobin: 14.5 g/dL (ref 13.0–17.0)
Immature Granulocytes: 0 %
Lymphocytes Relative: 16 %
Lymphs Abs: 1.2 10*3/uL (ref 0.7–4.0)
MCH: 33.8 pg (ref 26.0–34.0)
MCHC: 33.6 g/dL (ref 30.0–36.0)
MCV: 100.7 fL — ABNORMAL HIGH (ref 80.0–100.0)
Monocytes Absolute: 0.7 10*3/uL (ref 0.1–1.0)
Monocytes Relative: 9 %
Neutro Abs: 5.2 10*3/uL (ref 1.7–7.7)
Neutrophils Relative %: 71 %
Platelet Count: 265 10*3/uL (ref 150–400)
RBC: 4.29 MIL/uL (ref 4.22–5.81)
RDW: 12.9 % (ref 11.5–15.5)
WBC Count: 7.4 10*3/uL (ref 4.0–10.5)
nRBC: 0 % (ref 0.0–0.2)

## 2022-12-28 LAB — TSH: TSH: 1 u[IU]/mL (ref 0.350–4.500)

## 2022-12-28 LAB — SAVE SMEAR(SSMR), FOR PROVIDER SLIDE REVIEW

## 2022-12-28 NOTE — Progress Notes (Signed)
[facility] OFFICE PROGRESS NOTE   Diagnosis: Cardiac amyloid  INTERVAL HISTORY:   Daniel Yu returns as scheduled.  He is maintained on tafamidis for treatment of cardiac amyloidosis.  He is followed by Dr. Jens Som.  He reports an episode of gross hematuria in February.  He has been evaluated by urology.  He reports undergoing 3 cystoscopy examinations, last 1-2 weeks ago.  He reports no malignancy been found.  A "nodule "was noted in the bladder.  He continues apixaban anticoagulation.  Objective:  Vital signs in last 24 hours:  Blood pressure 118/69, pulse 71, temperature 98.2 F (36.8 C), resp. rate 18, height 5\' 10"  (1.778 m), weight 190 lb 6.4 oz (86.4 kg), SpO2 100 %.    HEENT: No macroglossia, mild white coat over the tongue, geographic tongue Lymphatics: No cervical, supraclavicular, axillary, or inguinal nodes Resp: Lungs with end inspiratory rales at the lower posterior chest bilaterally, no respiratory distress Cardio: Regular rate and rhythm GI: No hepatosplenomegaly Vascular: No leg edema   Lab Results:  Lab Results  Component Value Date   WBC 7.4 12/28/2022   HGB 14.5 12/28/2022   HCT 43.2 12/28/2022   MCV 100.7 (H) 12/28/2022   PLT 265 12/28/2022   NEUTROABS PENDING 12/28/2022  Blood smear: The platelets appear normal in number.  The Red cell morphology is unremarkable.  The polychromasia is not increased.  The majority the white cells are mature appearing neutrophils and lymphocytes.  No monotonous white cell population.  No blasts or other young forms.  CMP  Lab Results  Component Value Date   NA 133 (L) 10/17/2022   K 4.5 10/17/2022   CL 99 10/17/2022   CO2 25 10/17/2022   GLUCOSE 103 (H) 10/17/2022   BUN 25 (H) 10/17/2022   CREATININE 1.43 (H) 10/17/2022   CALCIUM 8.8 (L) 10/17/2022   PROT 6.8 09/18/2022   ALBUMIN 4.2 09/18/2022   AST 24 09/18/2022   ALT 28 09/18/2022   ALKPHOS 85 09/18/2022   BILITOT 0.5 09/18/2022    GFRNONAA 47 (L) 10/17/2022   GFRAA 91 12/02/2019    Medications: I have reviewed the patient's current medications.   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 Urine immunofixation 07/27/2022-positive Bence-Jones protein-kappa Serum immunofixation 07/27/2022-negative Mild elevation of serum free kappa light chains 07/27/2022 Serum protein electrophoresis/immunofixation 08/08/2022-negative 24-hour urine 08/09/2022-immunofixation negative for a monoclonal protein Bone marrow biopsy 08/24/2022-negative for multiple myeloma, amyloidosis, and lymphoma, mildly hypercellular marrow with benign lymphoid aggregates, 4% polyclonal plasma cells, Congo red stain negative, 46 X-Y 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  He has been maintained on tafamidis since February 2024.  He is being followed by Dr. Jens Som.  He has persistent mild Red cell macrocytosis.  This may be related to alcohol use.  He is not anemic.  The vitamin B12 level in the low end of normal range in February.  We will repeat a vitamin 12 level when he is here in December.  He would like to continue follow-up at the cancer center.  He will return for an office and lab visit in early December.  He will continue follow-up with urology for evaluation of hematuria.  Thornton Papas,  MD  12/28/2022  11:13 AM

## 2022-12-29 LAB — COMPREHENSIVE METABOLIC PANEL
ALT: 28 IU/L (ref 0–44)
AST: 30 IU/L (ref 0–40)
Albumin/Globulin Ratio: 1.2 (ref 1.2–2.2)
Albumin: 4.2 g/dL (ref 3.7–4.7)
Alkaline Phosphatase: 97 IU/L (ref 44–121)
BUN/Creatinine Ratio: 22 (ref 10–24)
BUN: 29 mg/dL — ABNORMAL HIGH (ref 8–27)
Bilirubin Total: 0.6 mg/dL (ref 0.0–1.2)
CO2: 19 mmol/L — ABNORMAL LOW (ref 20–29)
Calcium: 9.8 mg/dL (ref 8.6–10.2)
Chloride: 95 mmol/L — ABNORMAL LOW (ref 96–106)
Creatinine, Ser: 1.33 mg/dL — ABNORMAL HIGH (ref 0.76–1.27)
Globulin, Total: 3.4 g/dL (ref 1.5–4.5)
Glucose: 77 mg/dL (ref 70–99)
Potassium: 4.6 mmol/L (ref 3.5–5.2)
Sodium: 136 mmol/L (ref 134–144)
Total Protein: 7.6 g/dL (ref 6.0–8.5)
eGFR: 51 mL/min/{1.73_m2} — ABNORMAL LOW (ref >59)

## 2022-12-29 LAB — TSH: TSH: 1.09 u[IU]/mL (ref 0.450–4.500)

## 2023-01-03 DIAGNOSIS — L570 Actinic keratosis: Secondary | ICD-10-CM | POA: Diagnosis not present

## 2023-01-03 DIAGNOSIS — C44712 Basal cell carcinoma of skin of right lower limb, including hip: Secondary | ICD-10-CM | POA: Diagnosis not present

## 2023-01-03 DIAGNOSIS — D692 Other nonthrombocytopenic purpura: Secondary | ICD-10-CM | POA: Diagnosis not present

## 2023-01-03 DIAGNOSIS — D225 Melanocytic nevi of trunk: Secondary | ICD-10-CM | POA: Diagnosis not present

## 2023-01-03 DIAGNOSIS — Z85828 Personal history of other malignant neoplasm of skin: Secondary | ICD-10-CM | POA: Diagnosis not present

## 2023-01-03 DIAGNOSIS — L821 Other seborrheic keratosis: Secondary | ICD-10-CM | POA: Diagnosis not present

## 2023-01-03 DIAGNOSIS — L814 Other melanin hyperpigmentation: Secondary | ICD-10-CM | POA: Diagnosis not present

## 2023-01-08 ENCOUNTER — Other Ambulatory Visit: Payer: Self-pay

## 2023-01-08 ENCOUNTER — Other Ambulatory Visit (HOSPITAL_COMMUNITY): Payer: Self-pay

## 2023-01-08 ENCOUNTER — Telehealth: Payer: Self-pay | Admitting: Cardiology

## 2023-01-08 ENCOUNTER — Other Ambulatory Visit: Payer: Self-pay | Admitting: *Deleted

## 2023-01-08 DIAGNOSIS — I480 Paroxysmal atrial fibrillation: Secondary | ICD-10-CM

## 2023-01-08 DIAGNOSIS — I10 Essential (primary) hypertension: Secondary | ICD-10-CM

## 2023-01-08 MED ORDER — LOSARTAN POTASSIUM 100 MG PO TABS: 100.0000 mg | ORAL_TABLET | Freq: Every day | ORAL | 3 refills | Status: DC

## 2023-01-08 MED ORDER — AMIODARONE HCL 200 MG PO TABS: 200.0000 mg | ORAL_TABLET | Freq: Every day | ORAL | 3 refills | Status: DC

## 2023-01-08 NOTE — Telephone Encounter (Signed)
Spoke to patient's wife Dr.Crenshaw advised to increase losartan to 100 mg daily.Bmet in 1 week. She stated Dr.Newsome advised to stop Eliquis for 3 days.She wanted to ask Dr.Crenshaw if that would be safe.She is concerned with B/P being elevated if ok to hold for 3 days.I will send message to Dr.Crenshaw.

## 2023-01-08 NOTE — Telephone Encounter (Signed)
Spoke to patient's wife Dr.Crenshaw advised ok to hold Eliquis for 3 days.

## 2023-01-08 NOTE — Telephone Encounter (Signed)
Spoke to patient's wife she stated husband's B/P has been elevated for the past several days.B/P ranging 139/80,162/84,167/88,150/81,144/76/140/77. Pulse [id number]. Stated he feels fine,no complaints except he has had bright red blood in urine since yesterday.She is waiting on a call back from Dr.Newsome's office. He takes medications as listed in chart.I will send message to [facility] for advice.

## 2023-01-08 NOTE — Telephone Encounter (Signed)
Pt c/o BP issue: STAT if pt c/o blurred vision, one-sided weakness or slurred speech  1. What are your last 5 BP readings?  05/01 164/136 05/19 8:30 am 167/88 2:30 pm 162/84  2. Are you having any other symptoms (ex. Dizziness, headache, blurred vision, passed out)? No   3. What is your BP issue? Hypertension  Also having blood in urine.

## 2023-01-14 ENCOUNTER — Other Ambulatory Visit: Payer: Self-pay

## 2023-01-14 ENCOUNTER — Emergency Department (HOSPITAL_COMMUNITY)
Admission: EM | Admit: 2023-01-14 | Discharge: 2023-01-15 | Disposition: A | Discharge status: Home / Self Care | Payer: Medicare Other | Attending: Emergency Medicine | Admitting: Emergency Medicine

## 2023-01-14 ENCOUNTER — Encounter (HOSPITAL_COMMUNITY): Payer: Self-pay

## 2023-01-14 DIAGNOSIS — Z96653 Presence of artificial knee joint, bilateral: Secondary | ICD-10-CM | POA: Diagnosis not present

## 2023-01-14 DIAGNOSIS — I10 Essential (primary) hypertension: Secondary | ICD-10-CM | POA: Insufficient documentation

## 2023-01-14 DIAGNOSIS — R7989 Other specified abnormal findings of blood chemistry: Secondary | ICD-10-CM | POA: Insufficient documentation

## 2023-01-14 DIAGNOSIS — J45909 Unspecified asthma, uncomplicated: Secondary | ICD-10-CM | POA: Insufficient documentation

## 2023-01-14 DIAGNOSIS — R778 Other specified abnormalities of plasma proteins: Secondary | ICD-10-CM | POA: Diagnosis not present

## 2023-01-14 DIAGNOSIS — R42 Dizziness and giddiness: Secondary | ICD-10-CM | POA: Insufficient documentation

## 2023-01-14 LAB — URINALYSIS, ROUTINE W REFLEX MICROSCOPIC
Bacteria, UA: NONE SEEN
Bilirubin Urine: NEGATIVE
Glucose, UA: >=500 mg/dL — AB
Hgb urine dipstick: NEGATIVE
Ketones, ur: NEGATIVE mg/dL
Leukocytes,Ua: NEGATIVE
Nitrite: NEGATIVE
Protein, ur: NEGATIVE mg/dL
Specific Gravity, Urine: 1.016 (ref 1.005–1.030)
pH: 5 (ref 5.0–8.0)

## 2023-01-14 LAB — CBC
HCT: 35.3 % — ABNORMAL LOW (ref 39.0–52.0)
Hemoglobin: 11.7 g/dL — ABNORMAL LOW (ref 13.0–17.0)
MCH: 34.5 pg — ABNORMAL HIGH (ref 26.0–34.0)
MCHC: 33.1 g/dL (ref 30.0–36.0)
MCV: 104.1 fL — ABNORMAL HIGH (ref 80.0–100.0)
Platelets: 215 10*3/uL (ref 150–400)
RBC: 3.39 MIL/uL — ABNORMAL LOW (ref 4.22–5.81)
RDW: 13.1 % (ref 11.5–15.5)
WBC: 6.2 10*3/uL (ref 4.0–10.5)
nRBC: 0 % (ref 0.0–0.2)

## 2023-01-14 LAB — CBG MONITORING, ED: Glucose-Capillary: 84 mg/dL (ref 70–99)

## 2023-01-14 NOTE — ED Provider Notes (Incomplete)
[facility]-EMERGENCY DEPT [facility] Emergency Department Provider Note MRN:  [id number]  Arrival date & time: 01/15/23     Chief Complaint   Dizziness   History of Present Illness   Daniel Yu is a 89 y.o. year-old male with a history of cardiac amyloidosis, paroxysmal A-fib presenting to the ED with chief complaint of dizziness.  Dizziness described as a lightheadedness this evening.  Stood up from the couch and a minute or so later became lightheaded, felt like he was going to pass out.  This has happened before and it caused him to fall.  And so he shouted for his wife to assist him.  The lightheadedness quickly subsided and he felt back to normal.  Did not fall.  Denies any concurrent symptoms.  No chest pain or shortness of breath, no headache, no numbness or weakness to the arms or legs.  Currently without symptoms.  No recent fever or illness.  Recently had his losartan increased from 50 mg to 100 mg.  Has had issues with lightheadedness and low blood pressures while on this higher dose.  Review of Systems  A thorough review of systems was obtained and all systems are negative except as noted in the HPI and PMH.   Patient's Health History    Past Medical History:  Diagnosis Date   Arthritis    right knee oa   Asthma    Carpal tunnel syndrome    BILATERAL   Cholecystitis    Complication of anesthesia    adverse reaction to the spinal   Hypertension    PAF (paroxysmal atrial fibrillation) (HCC)    SBO (small bowel obstruction) (HCC) 12/2015    Past Surgical History:  Procedure Laterality Date   CARDIOVERSION N/A 02/14/2017   Procedure: CARDIOVERSION;  Surgeon: Wendall Stade, MD;  Location: [facility] ENDOSCOPY;  Service: Cardiovascular;  Laterality: N/A;   CARDIOVERSION N/A 12/11/2019   Procedure: CARDIOVERSION;  Surgeon: Lewayne Bunting, MD;  Location: [facility] ENDOSCOPY;  Service: Cardiovascular;  Laterality: N/A;   CARDIOVERSION N/A 02/13/2022   Procedure: CARDIOVERSION;   Surgeon: Lewayne Bunting, MD;  Location: [facility] ENDOSCOPY;  Service: Cardiovascular;  Laterality: N/A;   CHOLECYSTECTOMY  02/09/2017   CHOLECYSTECTOMY N/A 02/09/2017   Procedure: LAPAROSCOPIC CHOLECYSTECTOMY WITH INTRAOPERATIVE CHOLANGIOGRAM;  Surgeon: Manus Rudd, MD;  Location: [facility] OR;  Service: General;  Laterality: N/A;   COLONOSCOPY WITH PROPOFOL N/A 11/09/2014   Procedure: COLONOSCOPY WITH PROPOFOL;  Surgeon: Charolett Bumpers, MD;  Location: [facility] ENDOSCOPY;  Service: Endoscopy;  Laterality: N/A;   HERNIA REPAIR     2013   LEFT HEART CATH AND CORONARY ANGIOGRAPHY N/A 05/23/2022   Procedure: LEFT HEART CATH AND CORONARY ANGIOGRAPHY;  Surgeon: Swaziland, Peter M, MD;  Location: [facility] INVASIVE CV LAB;  Service: Cardiovascular;  Laterality: N/A;   QUADRICEPS TENDON REPAIR Left 06/07/2015   Procedure: REPAIR LEFT QUADRICEP TENDON;  Surgeon: Ollen Gross, MD;  Location: [facility] ORS;  Service: Orthopedics;  Laterality: Left;   TEE WITHOUT CARDIOVERSION N/A 02/14/2017   Procedure: TRANSESOPHAGEAL ECHOCARDIOGRAM (TEE);  Surgeon: Wendall Stade, MD;  Location: [facility] ENDOSCOPY;  Service: Cardiovascular;  Laterality: N/A;   TOTAL KNEE ARTHROPLASTY Right 12/27/2015   Procedure: RIGHT TOTAL KNEE ARTHROPLASTY;  Surgeon: Ollen Gross, MD;  Location: [facility] ORS;  Service: Orthopedics;  Laterality: Right;    Family History  Problem Relation Age of Onset   Hypertension Father     Social History   Socioeconomic History   Marital status: Married  Spouse name: Eber Jones   Number of children: 2   Years of education: Not on file   Highest education level: Master's degree (e.g., MA, MS, MEng, MEd, MSW, MBA)  Occupational History   Occupation: retired  Tobacco Use   Smoking status: Never   Smokeless tobacco: Never  Vaping Use   Vaping Use: Never used  Substance and Sexual Activity   Alcohol use: Yes    Comment: 1 beer daily   Drug use: No   Sexual activity: Not on file  Other Topics Concern   Not on file  Social  History Narrative   Not on file   Social Determinants of Health   Financial Resource Strain: Low Risk  (06/06/2022)   Overall Financial Resource Strain (CARDIA)    Difficulty of Paying Living Expenses: Not hard at all  Food Insecurity: No Food Insecurity (06/06/2022)   Hunger Vital Sign    Worried About Running Out of Food in the Last Year: Never true    Ran Out of Food in the Last Year: Never true  Transportation Needs: No Transportation Needs (06/06/2022)   PRAPARE - Administrator, Civil Service (Medical): No    Lack of Transportation (Non-Medical): No  Physical Activity: Not on file  Stress: Not on file  Social Connections: Not on file  Intimate Partner Violence: Not At Risk (08/15/2022)   Humiliation, Afraid, Rape, and Kick questionnaire    Fear of Current or Ex-Partner: No    Emotionally Abused: No    Physically Abused: No    Sexually Abused: No     Physical Exam   Vitals:   01/15/23 0000 01/15/23 0030  BP: (!) 105/59 (!) 101/58  Pulse: (!) 59 61  Resp: (!) 22 (!) 24  Temp:    SpO2: 95% 91%    CONSTITUTIONAL: Well-appearing, NAD NEURO/PSYCH:  Alert and oriented x 3, no focal deficits EYES:  eyes equal and reactive ENT/NECK:  no LAD, no JVD CARDIO: Regular rate, well-perfused, normal S1 and S2 PULM:  CTAB no wheezing or rhonchi GI/GU:  non-distended, non-tender MSK/SPINE:  No gross deformities, no edema SKIN:  no rash, atraumatic   *Additional and/or pertinent findings included in MDM below  Diagnostic and Interventional Summary    EKG Interpretation  Date/Time:  Sunday Jan 14 2023 22:18:13 EDT Ventricular Rate:  62 PR Interval:    QRS Duration: 100 QT Interval:  458 QTC Calculation: 464 R Axis:   20 Text Interpretation: Sinus rhythm Nonspecific ST and T wave abnormality Prolonged QT Abnormal ECG When compared with ECG of 09-Jul-2022 17:08, PREVIOUS ECG IS PRESENT Confirmed by Kennis Carina [id number]) on 01/14/2023 11:42:36 PM       Labs  Reviewed  BASIC METABOLIC PANEL - Abnormal; Notable for the following components:      Result Value   CO2 17 (*)    BUN 31 (*)    Creatinine, Ser 1.44 (*)    Calcium 8.2 (*)    GFR, Estimated 47 (*)    All other components within normal limits  CBC - Abnormal; Notable for the following components:   RBC 3.39 (*)    Hemoglobin 11.7 (*)    HCT 35.3 (*)    MCV 104.1 (*)    MCH 34.5 (*)    All other components within normal limits  URINALYSIS, ROUTINE W REFLEX MICROSCOPIC - Abnormal; Notable for the following components:   Glucose, UA >=500 (*)    All other components within normal limits  TROPONIN  I (HIGH SENSITIVITY) - Abnormal; Notable for the following components:   Troponin I (High Sensitivity) 69 (*)    All other components within normal limits  TROPONIN I (HIGH SENSITIVITY) - Abnormal; Notable for the following components:   Troponin I (High Sensitivity) 63 (*)    All other components within normal limits  BRAIN NATRIURETIC PEPTIDE  CBG MONITORING, ED    No orders to display    Medications - No data to display   Procedures  /  Critical Care Procedures  ED Course and Medical Decision Making  Initial Impression and Ddx History sounds like orthostatic hypotension.  Possibly triggered by increase in losartan, patient also admits to not drinking very much water.  Also had a few alcoholic beverages this evening.  He is without symptoms at this time.  Still with a bit of a soft blood pressure here in the emergency department.  Other considerations include arrhythmia, electrolyte disturbance, atypical presentation of ACS.  Does have some nonspecific changes on EKG.  Will observe on cardiac monitor, awaiting labs, troponin.  With a reassuring workup patient seems appropriate for discharge with close follow-up.  Past medical/surgical history that increases complexity of ED encounter: CHF, cardiac amyloidosis, A-fib  Interpretation of Diagnostics I personally reviewed the EKG and my  interpretation is as follows: Sinus rhythm with nonspecific changes compared to prior  Labs reveal an acidosis as well as a mildly elevated troponin.  Patient Reassessment and Ultimate Disposition/Management     Patient continues to be symptom-free, vitals reassuring.  Given the elevated troponin will admit to medicine for observation/further care.  2:28 AM update: After discussion with Dr. Julian Reil of the hospitalist service as well as Dr. Orson Aloe of the cardiology service, the presentation seems very consistent with an episode of orthostatic hypotension, likely triggered by the recent increase in losartan.  The elevated troponin is likely an expected leak in the setting of cardiac amyloidosis.  He has not had a normal troponin in several years.  Per Dr. Orson Aloe this is not a concerning level and he is appropriate for discharge with close follow-up.  Patient management required discussion with the following services or consulting groups:  Hospitalist Service  Complexity of Problems Addressed Acute illness or injury that poses threat of life of bodily function  Additional Data Reviewed and Analyzed Further history obtained from: Further history from spouse/family member  Additional Factors Impacting ED Encounter Risk Consideration of hospitalization  Elmer Sow. Pilar Plate, MD [facility] Emergency Medicine [facility] [email or web address]  Final Clinical Impressions(s) / ED Diagnoses     ICD-10-CM   1. Lightheadedness  R42     2. Elevated troponin  R79.89       ED Discharge Orders     None        Discharge Instructions Discussed with and Provided to Patient:     Discharge Instructions      You were evaluated in the Emergency Department and after careful evaluation, we did not find any emergent condition requiring admission or further testing in the hospital.  Your exam/testing today was overall reassuring. Recommend decreasing your losartan to 50 mg  daily and following up closely with your cardiologist.  Please return to the Emergency Department if you experience any worsening of your condition.  Thank you for allowing [place] to be a part of your care.        Sabas Sous, MD 01/15/23 1610    Sabas Sous, MD 01/15/23 [phone]

## 2023-01-14 NOTE — ED Triage Notes (Addendum)
Pt reports he had sudden episode of lightheadedness tonight around 2200 after eating dinner and having 2 glasses of wine and a "scotch on the rocks." His wife took his BP and the lowest was 97/50. He reports he feels fine now. Denies pain. Did not fall today, denies loss of consciousness. BP now 100/56 in triage.  He recently had adjustments in his dose of Losartan.

## 2023-01-15 LAB — TROPONIN I (HIGH SENSITIVITY)
Troponin I (High Sensitivity): 63 ng/L — ABNORMAL HIGH (ref <18)
Troponin I (High Sensitivity): 69 ng/L — ABNORMAL HIGH (ref <18)

## 2023-01-15 LAB — BASIC METABOLIC PANEL
Anion gap: 15 (ref 5–15)
BUN: 31 mg/dL — ABNORMAL HIGH (ref 8–23)
CO2: 17 mmol/L — ABNORMAL LOW (ref 22–32)
Calcium: 8.2 mg/dL — ABNORMAL LOW (ref 8.9–10.3)
Chloride: 103 mmol/L (ref 98–111)
Creatinine, Ser: 1.44 mg/dL — ABNORMAL HIGH (ref 0.61–1.24)
GFR, Estimated: 47 mL/min — ABNORMAL LOW (ref >60)
Glucose, Bld: 86 mg/dL (ref 70–99)
Potassium: 4.2 mmol/L (ref 3.5–5.1)
Sodium: 135 mmol/L (ref 135–145)

## 2023-01-15 LAB — BRAIN NATRIURETIC PEPTIDE: B Natriuretic Peptide: 160.2 pg/mL — ABNORMAL HIGH (ref 0.0–100.0)

## 2023-01-15 NOTE — Discharge Instructions (Signed)
You were evaluated in the Emergency Department and after careful evaluation, we did not find any emergent condition requiring admission or further testing in the hospital.  Your exam/testing today was overall reassuring. Recommend decreasing your losartan to 50 mg daily and following up closely with your cardiologist.  Please return to the Emergency Department if you experience any worsening of your condition.  Thank you for allowing [place] to be a part of your care.

## 2023-01-16 DIAGNOSIS — I480 Paroxysmal atrial fibrillation: Secondary | ICD-10-CM | POA: Diagnosis not present

## 2023-01-16 DIAGNOSIS — I10 Essential (primary) hypertension: Secondary | ICD-10-CM | POA: Diagnosis not present

## 2023-01-16 DIAGNOSIS — R42 Dizziness and giddiness: Secondary | ICD-10-CM | POA: Diagnosis not present

## 2023-01-17 LAB — BASIC METABOLIC PANEL
BUN/Creatinine Ratio: 21 (ref 10–24)
BUN: 29 mg/dL — ABNORMAL HIGH (ref 8–27)
CO2: 20 mmol/L (ref 20–29)
Calcium: 8.8 mg/dL (ref 8.6–10.2)
Chloride: 102 mmol/L (ref 96–106)
Creatinine, Ser: 1.38 mg/dL — ABNORMAL HIGH (ref 0.76–1.27)
Glucose: 75 mg/dL (ref 70–99)
Potassium: 4.5 mmol/L (ref 3.5–5.2)
Sodium: 138 mmol/L (ref 134–144)
eGFR: 49 mL/min/{1.73_m2} — ABNORMAL LOW (ref >59)

## 2023-01-23 ENCOUNTER — Other Ambulatory Visit (HOSPITAL_BASED_OUTPATIENT_CLINIC_OR_DEPARTMENT_OTHER): Payer: Self-pay

## 2023-01-23 MED ORDER — SHINGRIX 50 MCG/0.5ML IM SUSR
0.5000 mL | Freq: Once | INTRAMUSCULAR | 1 refills | Status: AC
  Filled 2023-01-23: qty 0.5, 1d supply, fill #0

## 2023-02-12 DIAGNOSIS — C44712 Basal cell carcinoma of skin of right lower limb, including hip: Secondary | ICD-10-CM | POA: Diagnosis not present

## 2023-02-14 ENCOUNTER — Other Ambulatory Visit: Payer: Self-pay

## 2023-02-14 MED ORDER — APIXABAN 5 MG PO TABS: ORAL_TABLET | ORAL | 1 refills | Status: DC

## 2023-02-14 NOTE — Telephone Encounter (Signed)
Prescription refill request for Eliquis received. Indication:AFIB Last office visit:5/24 Scr:1.38  5/24 Age: 89 Weight:85.3  KG  PRESCRIPTION REFILLED

## 2023-02-16 ENCOUNTER — Telehealth: Payer: Self-pay | Admitting: Cardiology

## 2023-02-16 DIAGNOSIS — I10 Essential (primary) hypertension: Secondary | ICD-10-CM | POA: Diagnosis not present

## 2023-02-16 DIAGNOSIS — I5189 Other ill-defined heart diseases: Secondary | ICD-10-CM | POA: Diagnosis not present

## 2023-02-16 DIAGNOSIS — J45909 Unspecified asthma, uncomplicated: Secondary | ICD-10-CM | POA: Diagnosis not present

## 2023-02-16 DIAGNOSIS — I480 Paroxysmal atrial fibrillation: Secondary | ICD-10-CM | POA: Diagnosis not present

## 2023-02-16 MED ORDER — SPIRONOLACTONE 25 MG PO TABS: 12.5000 mg | ORAL_TABLET | Freq: Every day | ORAL | 3 refills | Status: DC

## 2023-02-16 NOTE — Telephone Encounter (Signed)
Pt c/o medication issue:  1. Name of Medication:   spironolactone (ALDACTONE) 25 MG tablet    2. How are you currently taking this medication (dosage and times per day)?   Take 1 tablet (25 mg total) by mouth daily.    3. Are you having a reaction (difficulty breathing--STAT)? No  4. What is your medication issue? Pt's PCP would like to know if above medication is able to be cut in half due to pt experiencing hypotension as well as lightheadedness . They would like a callback regarding this matter. Please advise

## 2023-02-16 NOTE — Telephone Encounter (Signed)
Spoke with pt wife and dr anderson's office, aware okay to decrease spironolactone to 12.5 mg once daily.

## 2023-02-16 NOTE — Telephone Encounter (Signed)
Spoke with Marylene Land (health concierge) for Dr. Dareen Piano, states patient has been complaining of lightheaded and hypotension. The readings are below. No heart rate given. 106/60 111/55 98/53 88/55  on Wednesday  113/61 (after lying down for a couple of minutes on Wednesday)  They would like to decrease spironolactone 25 mg to 12.5 mg.  Please advise

## 2023-03-08 DIAGNOSIS — L820 Inflamed seborrheic keratosis: Secondary | ICD-10-CM | POA: Diagnosis not present

## 2023-03-08 DIAGNOSIS — L570 Actinic keratosis: Secondary | ICD-10-CM | POA: Diagnosis not present

## 2023-03-08 DIAGNOSIS — D485 Neoplasm of uncertain behavior of skin: Secondary | ICD-10-CM | POA: Diagnosis not present

## 2023-03-21 DIAGNOSIS — R310 Gross hematuria: Secondary | ICD-10-CM | POA: Diagnosis not present

## 2023-03-21 DIAGNOSIS — R8289 Other abnormal findings on cytological and histological examination of urine: Secondary | ICD-10-CM | POA: Diagnosis not present

## 2023-05-12 ENCOUNTER — Other Ambulatory Visit: Payer: Self-pay | Admitting: Cardiology

## 2023-05-14 NOTE — Telephone Encounter (Signed)
Prescription refill request for Eliquis received. Indication:afib Last office visit:5/24 Scr:1.38  5/24 Age: 89 Weight:85.3  kg  Prescription refilled

## 2023-05-27 ENCOUNTER — Other Ambulatory Visit: Payer: Self-pay | Admitting: Cardiology

## 2023-05-27 DIAGNOSIS — R0609 Other forms of dyspnea: Secondary | ICD-10-CM

## 2023-06-02 ENCOUNTER — Other Ambulatory Visit: Payer: Self-pay | Admitting: Adult Health

## 2023-06-19 ENCOUNTER — Other Ambulatory Visit (HOSPITAL_BASED_OUTPATIENT_CLINIC_OR_DEPARTMENT_OTHER): Payer: Self-pay

## 2023-06-19 MED ORDER — FLUAD 0.5 ML IM SUSY
0.5000 mL | PREFILLED_SYRINGE | Freq: Once | INTRAMUSCULAR | 0 refills | Status: AC
  Filled 2023-06-19: qty 0.5, 1d supply, fill #0

## 2023-06-19 MED ORDER — COMIRNATY 30 MCG/0.3ML IM SUSY
0.3000 mL | PREFILLED_SYRINGE | Freq: Once | INTRAMUSCULAR | 0 refills | Status: AC
  Filled 2023-06-19: qty 0.3, 1d supply, fill #0

## 2023-06-20 DIAGNOSIS — R310 Gross hematuria: Secondary | ICD-10-CM | POA: Diagnosis not present

## 2023-06-20 DIAGNOSIS — R8289 Other abnormal findings on cytological and histological examination of urine: Secondary | ICD-10-CM | POA: Diagnosis not present

## 2023-06-28 ENCOUNTER — Other Ambulatory Visit: Payer: Self-pay | Admitting: Cardiology

## 2023-06-28 NOTE — Telephone Encounter (Signed)
[facility] is requesting a prior auth on medication Vyndamax 61 mg capsules. Please address

## 2023-06-28 NOTE — Addendum Note (Signed)
Addended by: Margaret Pyle D on: 06/28/2023 02:15 PM   Modules accepted: Orders

## 2023-06-28 NOTE — Progress Notes (Signed)
HPI: Follow-up atrial fibrillation and amyloid. Patient with history of PAF. Underwent successful cardioversion February 13, 2022 on amiodarone.  Also with history of bradycardia requiring discontinuation of metoprolol and Cardizem.  PET scan September 2023 showed concern for balanced ischemia but perfusion was normal, severe coronary calcification and flow was markedly abnormal suggestive of multivessel disease.  Ejection fraction 42% which decreased to 34% with exercise.  Note Dr. Flora Lipps felt it was also potentially consistent with amyloid.  Cardiac catheterization October 2023 showed 10% left main, 40% LAD and ejection fraction 50 to 55%; left ventricular end-diastolic pressure 24 mmHg. Echocardiogram October 2023 showed normal LV function, grade 2 diastolic dysfunction, mild biatrial enlargement. 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  Patient seen by oncology and underwent bone marrow biopsy which did not show multiple myeloma, amyloidosis or lymphoma.  He has been initiated on tafamidis.  Since last seen, the patient denies any dyspnea on exertion, orthopnea, PND, pedal edema, palpitations, syncope or chest pain.   Current Outpatient Medications  Medication Sig Dispense Refill   amiodarone (PACERONE) 200 MG tablet Take 1 tablet (200 mg total) by mouth daily. 90 tablet 3   Ascorbic Acid (VITAMIN C) 1000 MG tablet Take 1,000 mg by mouth in the morning.     cetirizine (ZYRTEC) 10 MG tablet Take 10 mg by mouth in the morning.     ELIQUIS 5 MG TABS tablet TAKE 1 TABLET(5 MG) BY MOUTH TWICE DAILY 180 tablet 1   FARXIGA 10 MG TABS tablet Take 1 tablet (10 mg total) by mouth daily before breakfast. 90 tablet 3   Fluticasone Furoate (ARNUITY ELLIPTA) 100 MCG/ACT AEPB 1 puff Inhalation twice daily for 30 days     furosemide (LASIX) 20 MG tablet TAKE 2 TABLETS(40 MG) BY MOUTH DAILY 180 tablet 1   losartan (COZAAR) 50 MG tablet Take 50 mg by mouth daily.     rosuvastatin (CRESTOR)  10 MG tablet TAKE 1 TABLET(10 MG) BY MOUTH DAILY 90 tablet 1   spironolactone (ALDACTONE) 25 MG tablet TAKE 1 TABLET(25 MG) BY MOUTH DAILY (Patient taking differently: 12.5 mg alternating with 25 mg every other day) 90 tablet 3   Tafamidis (VYNDAMAX) 61 MG CAPS Take 1 capsule (61 mg total) by mouth daily. 90 capsule 1   No current facility-administered medications for this visit.     Past Medical History:  Diagnosis Date   Arthritis    right knee oa   Asthma    Carpal tunnel syndrome    BILATERAL   Cholecystitis    Complication of anesthesia    adverse reaction to the spinal   Hypertension    PAF (paroxysmal atrial fibrillation) (HCC)    SBO (small bowel obstruction) (HCC) 12/2015    Past Surgical History:  Procedure Laterality Date   CARDIOVERSION N/A 02/14/2017   Procedure: CARDIOVERSION;  Surgeon: Wendall Stade, MD;  Location: [facility] ENDOSCOPY;  Service: Cardiovascular;  Laterality: N/A;   CARDIOVERSION N/A 12/11/2019   Procedure: CARDIOVERSION;  Surgeon: Lewayne Bunting, MD;  Location: [facility] ENDOSCOPY;  Service: Cardiovascular;  Laterality: N/A;   CARDIOVERSION N/A 02/13/2022   Procedure: CARDIOVERSION;  Surgeon: Lewayne Bunting, MD;  Location: [facility] ENDOSCOPY;  Service: Cardiovascular;  Laterality: N/A;   CHOLECYSTECTOMY  02/09/2017   CHOLECYSTECTOMY N/A 02/09/2017   Procedure: LAPAROSCOPIC CHOLECYSTECTOMY WITH INTRAOPERATIVE CHOLANGIOGRAM;  Surgeon: Manus Rudd, MD;  Location: [facility] OR;  Service: General;  Laterality: N/A;   COLONOSCOPY WITH PROPOFOL N/A 11/09/2014  Procedure: COLONOSCOPY WITH PROPOFOL;  Surgeon: Charolett Bumpers, MD;  Location: [facility] ENDOSCOPY;  Service: Endoscopy;  Laterality: N/A;   HERNIA REPAIR     2013   LEFT HEART CATH AND CORONARY ANGIOGRAPHY N/A 05/23/2022   Procedure: LEFT HEART CATH AND CORONARY ANGIOGRAPHY;  Surgeon: Swaziland, Peter M, MD;  Location: [facility] INVASIVE CV LAB;  Service: Cardiovascular;  Laterality: N/A;   QUADRICEPS TENDON REPAIR Left 06/07/2015    Procedure: REPAIR LEFT QUADRICEP TENDON;  Surgeon: Ollen Gross, MD;  Location: [facility] ORS;  Service: Orthopedics;  Laterality: Left;   TEE WITHOUT CARDIOVERSION N/A 02/14/2017   Procedure: TRANSESOPHAGEAL ECHOCARDIOGRAM (TEE);  Surgeon: Wendall Stade, MD;  Location: [facility] ENDOSCOPY;  Service: Cardiovascular;  Laterality: N/A;   TOTAL KNEE ARTHROPLASTY Right 12/27/2015   Procedure: RIGHT TOTAL KNEE ARTHROPLASTY;  Surgeon: Ollen Gross, MD;  Location: [facility] ORS;  Service: Orthopedics;  Laterality: Right;    Social History   Socioeconomic History   Marital status: Married    Spouse name: Eber Jones   Number of children: 2   Years of education: Not on file   Highest education level: Master's degree (e.g., MA, MS, MEng, MEd, MSW, MBA)  Occupational History   Occupation: retired  Tobacco Use   Smoking status: Never   Smokeless tobacco: Never  Vaping Use   Vaping status: Never Used  Substance and Sexual Activity   Alcohol use: Yes    Comment: 1 beer daily   Drug use: No   Sexual activity: Not on file  Other Topics Concern   Not on file  Social History Narrative   Not on file   Social Determinants of Health   Financial Resource Strain: Low Risk  (06/06/2022)   Overall Financial Resource Strain (CARDIA)    Difficulty of Paying Living Expenses: Not hard at all  Food Insecurity: No Food Insecurity (06/06/2022)   Hunger Vital Sign    Worried About Running Out of Food in the Last Year: Never true    Ran Out of Food in the Last Year: Never true  Transportation Needs: No Transportation Needs (06/06/2022)   PRAPARE - Administrator, Civil Service (Medical): No    Lack of Transportation (Non-Medical): No  Physical Activity: Not on file  Stress: Not on file  Social Connections: Not on file  Intimate Partner Violence: Not At Risk (08/15/2022)   Humiliation, Afraid, Rape, and Kick questionnaire    Fear of Current or Ex-Partner: No    Emotionally Abused: No    Physically Abused: No     Sexually Abused: No    Family History  Problem Relation Age of Onset   Hypertension Father     ROS: no fevers or chills, productive cough, hemoptysis, dysphasia, odynophagia, melena, hematochezia, dysuria, hematuria, rash, seizure activity, orthopnea, PND, pedal edema, claudication. Remaining systems are negative.  Physical Exam: Well-developed well-nourished in no acute distress.  Skin is warm and dry.  HEENT is normal.  Neck is supple.  Chest is clear to auscultation with normal expansion.  Cardiovascular exam is regular rate and rhythm.  Abdominal exam nontender or distended. No masses palpated. Extremities show no edema. neuro grossly intact  EKG Interpretation Date/Time:  Thursday July 12 2023 08:03:29 EST Ventricular Rate:  67 PR Interval:    QRS Duration:  104 QT Interval:  438 QTC Calculation: 462 R Axis:   12  Text Interpretation: NSR with first degree AV block When compared with ECG of 14-Jan-2023 22:18, Nonspecific T wave abnormality no longer  evident in Inferior leads Confirmed by Olga Millers ([id number]) on 07/12/2023 8:08:17 AM    A/P  1 amyloidosis-continue tafamidis.  Check CBC, TSH, free T4, liver functions, renal function and INR.  Repeat echocardiogram.  2 paroxysmal atrial fibrillation-patient remains in sinus rhythm.  Continue amiodarone and apixaban.  Check chest x-ray.  3 chronic diastolic congestive heart failure-he remains euvolemic.  Will continue Farxiga and diuretic at present dose.  4 hypertension-patient's blood pressure is controlled.  Continue present medical regimen.  5 history of cardiomyopathy-LV function has improved on most recent echocardiogram.  Will repeat study.  6 coronary artery disease-mild on previous catheterization.  Will continue statin.  7 hyperlipidemia-check lipids.  Olga Millers, MD

## 2023-06-29 DIAGNOSIS — D6869 Other thrombophilia: Secondary | ICD-10-CM | POA: Diagnosis not present

## 2023-06-29 DIAGNOSIS — I10 Essential (primary) hypertension: Secondary | ICD-10-CM | POA: Diagnosis not present

## 2023-06-29 DIAGNOSIS — I5189 Other ill-defined heart diseases: Secondary | ICD-10-CM | POA: Diagnosis not present

## 2023-06-29 DIAGNOSIS — Z23 Encounter for immunization: Secondary | ICD-10-CM | POA: Diagnosis not present

## 2023-06-29 DIAGNOSIS — Z79899 Other long term (current) drug therapy: Secondary | ICD-10-CM | POA: Diagnosis not present

## 2023-06-29 DIAGNOSIS — J45909 Unspecified asthma, uncomplicated: Secondary | ICD-10-CM | POA: Diagnosis not present

## 2023-06-29 DIAGNOSIS — I5032 Chronic diastolic (congestive) heart failure: Secondary | ICD-10-CM | POA: Diagnosis not present

## 2023-06-29 DIAGNOSIS — I480 Paroxysmal atrial fibrillation: Secondary | ICD-10-CM | POA: Diagnosis not present

## 2023-06-29 DIAGNOSIS — D7589 Other specified diseases of blood and blood-forming organs: Secondary | ICD-10-CM | POA: Diagnosis not present

## 2023-06-29 DIAGNOSIS — E854 Organ-limited amyloidosis: Secondary | ICD-10-CM | POA: Diagnosis not present

## 2023-06-29 DIAGNOSIS — Z0000 Encounter for general adult medical examination without abnormal findings: Secondary | ICD-10-CM | POA: Diagnosis not present

## 2023-06-29 LAB — LAB REPORT - SCANNED
Albumin, Urine POC: <0.7
Creatinine, POC: 51 mg/dL
Microalb Creat Ratio: <13.6

## 2023-06-29 LAB — COMPREHENSIVE METABOLIC PANEL: EGFR: 60

## 2023-07-03 ENCOUNTER — Telehealth: Payer: Self-pay | Admitting: Pharmacy Technician

## 2023-07-03 ENCOUNTER — Other Ambulatory Visit (HOSPITAL_COMMUNITY): Payer: Self-pay

## 2023-07-03 MED ORDER — VYNDAMAX 61 MG PO CAPS: 1.0000 | ORAL_CAPSULE | Freq: Every day | ORAL | 1 refills | Status: DC

## 2023-07-03 NOTE — Telephone Encounter (Signed)
Pharmacy Patient Advocate Encounter  Received notification from [facility] that Prior Authorization for vynda,ax has been CANCELLED due to too soon to submit prior auth- will upload letter into media. Will try again in a couple of weeks to submit again  Prior auth on file good until 08-21-23 PA #/Case ID/Reference #: [id number]

## 2023-07-03 NOTE — Telephone Encounter (Signed)
Pharmacy Patient Advocate Encounter   Received notification from RX Request Messages that prior authorization for vyndamax is required/requested.   Insurance verification completed.   The patient is insured through [facility] .   Per test claim: PA required; PA submitted to above mentioned insurance via CoverMyMeds Key/confirmation #/EOC [id number] Status is pending

## 2023-07-03 NOTE — Addendum Note (Signed)
Addended by: Margaret Pyle D on: 07/03/2023 08:58 AM   Modules accepted: Orders

## 2023-07-06 DIAGNOSIS — C44619 Basal cell carcinoma of skin of left upper limb, including shoulder: Secondary | ICD-10-CM | POA: Diagnosis not present

## 2023-07-06 DIAGNOSIS — L570 Actinic keratosis: Secondary | ICD-10-CM | POA: Diagnosis not present

## 2023-07-06 DIAGNOSIS — C44629 Squamous cell carcinoma of skin of left upper limb, including shoulder: Secondary | ICD-10-CM | POA: Diagnosis not present

## 2023-07-06 DIAGNOSIS — D1801 Hemangioma of skin and subcutaneous tissue: Secondary | ICD-10-CM | POA: Diagnosis not present

## 2023-07-06 DIAGNOSIS — Z85828 Personal history of other malignant neoplasm of skin: Secondary | ICD-10-CM | POA: Diagnosis not present

## 2023-07-06 DIAGNOSIS — L821 Other seborrheic keratosis: Secondary | ICD-10-CM | POA: Diagnosis not present

## 2023-07-09 ENCOUNTER — Other Ambulatory Visit: Payer: Self-pay | Admitting: Cardiology

## 2023-07-09 DIAGNOSIS — E7800 Pure hypercholesterolemia, unspecified: Secondary | ICD-10-CM

## 2023-07-12 ENCOUNTER — Encounter: Payer: Self-pay | Admitting: Cardiology

## 2023-07-12 ENCOUNTER — Ambulatory Visit: Payer: Medicare Other | Attending: Cardiology | Admitting: Cardiology

## 2023-07-12 VITALS — BP 100/60 | HR 62 | Ht 70.0 in | Wt 197.6 lb

## 2023-07-12 DIAGNOSIS — I5032 Chronic diastolic (congestive) heart failure: Secondary | ICD-10-CM

## 2023-07-12 DIAGNOSIS — E854 Organ-limited amyloidosis: Secondary | ICD-10-CM

## 2023-07-12 DIAGNOSIS — I480 Paroxysmal atrial fibrillation: Secondary | ICD-10-CM | POA: Diagnosis not present

## 2023-07-12 DIAGNOSIS — I10 Essential (primary) hypertension: Secondary | ICD-10-CM

## 2023-07-12 DIAGNOSIS — I2510 Atherosclerotic heart disease of native coronary artery without angina pectoris: Secondary | ICD-10-CM | POA: Diagnosis not present

## 2023-07-12 DIAGNOSIS — E7800 Pure hypercholesterolemia, unspecified: Secondary | ICD-10-CM | POA: Diagnosis not present

## 2023-07-12 DIAGNOSIS — I43 Cardiomyopathy in diseases classified elsewhere: Secondary | ICD-10-CM | POA: Diagnosis not present

## 2023-07-12 NOTE — Patient Instructions (Signed)
    Testing/Procedures:  Your physician has requested that you have an echocardiogram. Echocardiography is a painless test that uses sound waves to create images of your heart. It provides your doctor with information about the size and shape of your heart and how well your heart's chambers and valves are working. This procedure takes approximately one hour. There are no restrictions for this procedure. Please do NOT wear cologne, perfume, aftershave, or lotions (deodorant is allowed). Please arrive 15 minutes prior to your appointment time. [address]  A chest x-ray takes a picture of the organs and structures inside the chest, including the heart, lungs, and blood vessels. This test can show several things, including, whether the heart is enlarges; whether fluid is building up in the lungs; and whether pacemaker / defibrillator leads are still in place. [id number]  .   Follow-Up: At [facility], you and your health needs are our priority.  As part of our continuing mission to provide you with exceptional heart care, we have created designated Provider Care Teams.  These Care Teams include your primary Cardiologist (physician) and Advanced Practice Providers (APPs -  Physician Assistants and Nurse Practitioners) who all work together to provide you with the care you need, when you need it.    Your next appointment:   6 month(s)  Provider:   Olga Millers, MD

## 2023-07-13 LAB — COMPREHENSIVE METABOLIC PANEL
ALT: 24 [IU]/L (ref 0–44)
AST: 24 [IU]/L (ref 0–40)
Albumin: 4.2 g/dL (ref 3.7–4.7)
Alkaline Phosphatase: 83 [IU]/L (ref 44–121)
BUN/Creatinine Ratio: 17 (ref 10–24)
BUN: 21 mg/dL (ref 8–27)
Bilirubin Total: 0.6 mg/dL (ref 0.0–1.2)
CO2: 22 mmol/L (ref 20–29)
Calcium: 9.3 mg/dL (ref 8.6–10.2)
Chloride: 97 mmol/L (ref 96–106)
Creatinine, Ser: 1.23 mg/dL (ref 0.76–1.27)
Globulin, Total: 3.1 g/dL (ref 1.5–4.5)
Glucose: 82 mg/dL (ref 70–99)
Potassium: 5.1 mmol/L (ref 3.5–5.2)
Sodium: 136 mmol/L (ref 134–144)
Total Protein: 7.3 g/dL (ref 6.0–8.5)
eGFR: 56 mL/min/{1.73_m2} — ABNORMAL LOW (ref >59)

## 2023-07-13 LAB — CBC
Hematocrit: 44.1 % (ref 37.5–51.0)
Hemoglobin: 14.6 g/dL (ref 13.0–17.7)
MCH: 34.2 pg — ABNORMAL HIGH (ref 26.6–33.0)
MCHC: 33.1 g/dL (ref 31.5–35.7)
MCV: 103 fL — ABNORMAL HIGH (ref 79–97)
Platelets: 253 10*3/uL (ref 150–450)
RBC: 4.27 x10E6/uL (ref 4.14–5.80)
RDW: 12 % (ref 11.6–15.4)
WBC: 6 10*3/uL (ref 3.4–10.8)

## 2023-07-13 LAB — PROTIME-INR
INR: 1.1 (ref 0.9–1.2)
Prothrombin Time: 12.4 s — ABNORMAL HIGH (ref 9.1–12.0)

## 2023-07-13 LAB — LIPID PANEL
Chol/HDL Ratio: 1.8 ratio (ref 0.0–5.0)
Cholesterol, Total: 153 mg/dL (ref 100–199)
HDL: 84 mg/dL (ref >39)
LDL Chol Calc (NIH): 54 mg/dL (ref 0–99)
Triglycerides: 76 mg/dL (ref 0–149)
VLDL Cholesterol Cal: 15 mg/dL (ref 5–40)

## 2023-07-13 LAB — TSH+FREE T4
Free T4: 1.35 ng/dL (ref 0.82–1.77)
TSH: 1.5 u[IU]/mL (ref 0.450–4.500)

## 2023-07-23 ENCOUNTER — Inpatient Hospital Stay: Payer: Medicare Other

## 2023-07-23 ENCOUNTER — Inpatient Hospital Stay: Payer: Medicare Other | Admitting: Oncology

## 2023-07-31 ENCOUNTER — Inpatient Hospital Stay: Payer: Medicare Other | Attending: Oncology | Admitting: Oncology

## 2023-07-31 ENCOUNTER — Inpatient Hospital Stay: Payer: Medicare Other

## 2023-07-31 VITALS — BP 107/60 | HR 72 | Temp 98.0°F | Resp 18 | Ht 70.0 in | Wt 197.9 lb

## 2023-07-31 DIAGNOSIS — I4891 Unspecified atrial fibrillation: Secondary | ICD-10-CM | POA: Insufficient documentation

## 2023-07-31 DIAGNOSIS — I509 Heart failure, unspecified: Secondary | ICD-10-CM | POA: Insufficient documentation

## 2023-07-31 DIAGNOSIS — E854 Organ-limited amyloidosis: Secondary | ICD-10-CM | POA: Diagnosis not present

## 2023-07-31 DIAGNOSIS — N289 Disorder of kidney and ureter, unspecified: Secondary | ICD-10-CM | POA: Diagnosis not present

## 2023-07-31 DIAGNOSIS — D539 Nutritional anemia, unspecified: Secondary | ICD-10-CM | POA: Diagnosis not present

## 2023-07-31 DIAGNOSIS — D7589 Other specified diseases of blood and blood-forming organs: Secondary | ICD-10-CM

## 2023-07-31 DIAGNOSIS — R768 Other specified abnormal immunological findings in serum: Secondary | ICD-10-CM | POA: Diagnosis not present

## 2023-07-31 DIAGNOSIS — I43 Cardiomyopathy in diseases classified elsewhere: Secondary | ICD-10-CM | POA: Insufficient documentation

## 2023-07-31 LAB — CBC WITH DIFFERENTIAL (CANCER CENTER ONLY)
Abs Immature Granulocytes: 0.02 10*3/uL (ref 0.00–0.07)
Basophils Absolute: 0 10*3/uL (ref 0.0–0.1)
Basophils Relative: 1 %
Eosinophils Absolute: 0.2 10*3/uL (ref 0.0–0.5)
Eosinophils Relative: 4 %
HCT: 41.8 % (ref 39.0–52.0)
Hemoglobin: 13.9 g/dL (ref 13.0–17.0)
Immature Granulocytes: 0 %
Lymphocytes Relative: 15 %
Lymphs Abs: 0.9 10*3/uL (ref 0.7–4.0)
MCH: 33.5 pg (ref 26.0–34.0)
MCHC: 33.3 g/dL (ref 30.0–36.0)
MCV: 100.7 fL — ABNORMAL HIGH (ref 80.0–100.0)
Monocytes Absolute: 0.6 10*3/uL (ref 0.1–1.0)
Monocytes Relative: 10 %
Neutro Abs: 4.4 10*3/uL (ref 1.7–7.7)
Neutrophils Relative %: 70 %
Platelet Count: 229 10*3/uL (ref 150–400)
RBC: 4.15 MIL/uL — ABNORMAL LOW (ref 4.22–5.81)
RDW: 12.8 % (ref 11.5–15.5)
WBC Count: 6.2 10*3/uL (ref 4.0–10.5)
nRBC: 0 % (ref 0.0–0.2)

## 2023-07-31 LAB — VITAMIN B12: Vitamin B-12: 378 pg/mL (ref 180–914)

## 2023-07-31 NOTE — Progress Notes (Signed)
  [facility] OFFICE PROGRESS NOTE   Diagnosis: Cardiac amyloidosis  INTERVAL HISTORY:   Daniel Yu returns as scheduled.  He feels well.  He has noted significant improvement in dyspnea since beginning tafamidis.  He drinks approximately 2 beers and 1 glass of wine each night.  He reports undergoing repeat cystoscopy evaluations by urology to evaluate hematuria.  Objective:  Vital signs in last 24 hours:  Blood pressure 107/60, pulse 72, temperature 98 F (36.7 C), temperature source Temporal, resp. rate 18, height 5\' 10"  (1.778 m), weight 197 lb 14.4 oz (89.8 kg), SpO2 97%.    HEENT: No macroglossia Lymphatics: No cervical, supraclavicular, axillary, or inguinal nodes Resp: End inspiratory fine rales at the extreme posterior lateral base bilaterally, no respiratory distress Cardio: Regular rate and rhythm GI: No hepatosplenomegaly, nontender Vascular: No leg edema   Lab Results:  Lab Results  Component Value Date   WBC 6.2 07/31/2023   HGB 13.9 07/31/2023   HCT 41.8 07/31/2023   MCV 100.7 (H) 07/31/2023   PLT 229 07/31/2023   NEUTROABS 4.4 07/31/2023    CMP  Lab Results  Component Value Date   NA 136 07/12/2023   K 5.1 07/12/2023   CL 97 07/12/2023   CO2 22 07/12/2023   GLUCOSE 82 07/12/2023   BUN 21 07/12/2023   CREATININE 1.23 07/12/2023   CALCIUM 9.3 07/12/2023   PROT 7.3 07/12/2023   ALBUMIN 4.2 07/12/2023   AST 24 07/12/2023   ALT 24 07/12/2023   ALKPHOS 83 07/12/2023   BILITOT 0.6 07/12/2023   GFRNONAA 47 (L) 01/14/2023   GFRAA 91 12/02/2019     Medications: I have reviewed the patient's current medications.   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 Urine immunofixation 07/27/2022-positive Bence-Jones protein-kappa Serum  immunofixation 07/27/2022-negative Mild elevation of serum free kappa light chains 07/27/2022 Serum protein electrophoresis/immunofixation 08/08/2022-negative 24-hour urine 08/09/2022-immunofixation negative for a monoclonal protein Bone marrow biopsy 08/24/2022-negative for multiple myeloma, amyloidosis, and lymphoma, mildly hypercellular marrow with benign lymphoid aggregates, 4% polyclonal plasma cells, Congo red stain negative, 46 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  His clinical status has improved since beginning tafamidis.  There is no evidence for systemic progression of amyloidosis.  We will follow-up on the serum light chains from today.  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  He is not anemic.  He would like to continue follow-up at the cancer center.  He will return for an office and lab visit in 8 months.  Daniel Papas, MD  07/31/2023  8:20 AM

## 2023-08-01 LAB — KAPPA/LAMBDA LIGHT CHAINS
Kappa free light chain: 37 mg/L — ABNORMAL HIGH (ref 3.3–19.4)
Kappa, lambda light chain ratio: 1.38 (ref 0.26–1.65)
Lambda free light chains: 26.9 mg/L — ABNORMAL HIGH (ref 5.7–26.3)

## 2023-08-06 DIAGNOSIS — C44629 Squamous cell carcinoma of skin of left upper limb, including shoulder: Secondary | ICD-10-CM | POA: Diagnosis not present

## 2023-08-12 ENCOUNTER — Other Ambulatory Visit: Payer: Self-pay

## 2023-08-12 ENCOUNTER — Emergency Department (HOSPITAL_COMMUNITY): Payer: Medicare Other

## 2023-08-12 ENCOUNTER — Encounter (HOSPITAL_COMMUNITY): Payer: Self-pay | Admitting: Internal Medicine

## 2023-08-12 ENCOUNTER — Observation Stay (HOSPITAL_COMMUNITY)
Admission: EM | Admit: 2023-08-12 | Discharge: 2023-08-13 | Disposition: A | Discharge status: Home Health | Payer: Medicare Other | Attending: Family Medicine | Admitting: Family Medicine

## 2023-08-12 DIAGNOSIS — S0033XA Contusion of nose, initial encounter: Secondary | ICD-10-CM | POA: Diagnosis not present

## 2023-08-12 DIAGNOSIS — M47817 Spondylosis without myelopathy or radiculopathy, lumbosacral region: Secondary | ICD-10-CM | POA: Diagnosis not present

## 2023-08-12 DIAGNOSIS — E854 Organ-limited amyloidosis: Secondary | ICD-10-CM | POA: Diagnosis present

## 2023-08-12 DIAGNOSIS — Z7984 Long term (current) use of oral hypoglycemic drugs: Secondary | ICD-10-CM | POA: Insufficient documentation

## 2023-08-12 DIAGNOSIS — I5032 Chronic diastolic (congestive) heart failure: Secondary | ICD-10-CM | POA: Diagnosis present

## 2023-08-12 DIAGNOSIS — Z79899 Other long term (current) drug therapy: Secondary | ICD-10-CM | POA: Diagnosis not present

## 2023-08-12 DIAGNOSIS — I130 Hypertensive heart and chronic kidney disease with heart failure and stage 1 through stage 4 chronic kidney disease, or unspecified chronic kidney disease: Secondary | ICD-10-CM | POA: Diagnosis not present

## 2023-08-12 DIAGNOSIS — Z7901 Long term (current) use of anticoagulants: Secondary | ICD-10-CM | POA: Diagnosis not present

## 2023-08-12 DIAGNOSIS — M25531 Pain in right wrist: Secondary | ICD-10-CM | POA: Diagnosis not present

## 2023-08-12 DIAGNOSIS — Z743 Need for continuous supervision: Secondary | ICD-10-CM | POA: Diagnosis not present

## 2023-08-12 DIAGNOSIS — R6889 Other general symptoms and signs: Secondary | ICD-10-CM | POA: Diagnosis not present

## 2023-08-12 DIAGNOSIS — S022XXA Fracture of nasal bones, initial encounter for closed fracture: Secondary | ICD-10-CM | POA: Diagnosis not present

## 2023-08-12 DIAGNOSIS — M160 Bilateral primary osteoarthritis of hip: Secondary | ICD-10-CM | POA: Diagnosis not present

## 2023-08-12 DIAGNOSIS — Z043 Encounter for examination and observation following other accident: Secondary | ICD-10-CM | POA: Diagnosis not present

## 2023-08-12 DIAGNOSIS — R404 Transient alteration of awareness: Secondary | ICD-10-CM | POA: Diagnosis not present

## 2023-08-12 DIAGNOSIS — R55 Syncope and collapse: Principal | ICD-10-CM | POA: Insufficient documentation

## 2023-08-12 DIAGNOSIS — I499 Cardiac arrhythmia, unspecified: Secondary | ICD-10-CM | POA: Diagnosis not present

## 2023-08-12 DIAGNOSIS — J45909 Unspecified asthma, uncomplicated: Secondary | ICD-10-CM | POA: Insufficient documentation

## 2023-08-12 DIAGNOSIS — I10 Essential (primary) hypertension: Secondary | ICD-10-CM | POA: Diagnosis not present

## 2023-08-12 DIAGNOSIS — R001 Bradycardia, unspecified: Secondary | ICD-10-CM

## 2023-08-12 DIAGNOSIS — W19XXXA Unspecified fall, initial encounter: Secondary | ICD-10-CM | POA: Insufficient documentation

## 2023-08-12 DIAGNOSIS — I2510 Atherosclerotic heart disease of native coronary artery without angina pectoris: Secondary | ICD-10-CM | POA: Diagnosis not present

## 2023-08-12 DIAGNOSIS — R9082 White matter disease, unspecified: Secondary | ICD-10-CM | POA: Diagnosis not present

## 2023-08-12 DIAGNOSIS — S0181XA Laceration without foreign body of other part of head, initial encounter: Secondary | ICD-10-CM | POA: Insufficient documentation

## 2023-08-12 DIAGNOSIS — Z23 Encounter for immunization: Secondary | ICD-10-CM | POA: Insufficient documentation

## 2023-08-12 DIAGNOSIS — M25561 Pain in right knee: Secondary | ICD-10-CM | POA: Diagnosis not present

## 2023-08-12 DIAGNOSIS — S0083XA Contusion of other part of head, initial encounter: Secondary | ICD-10-CM

## 2023-08-12 DIAGNOSIS — T148XXA Other injury of unspecified body region, initial encounter: Secondary | ICD-10-CM

## 2023-08-12 DIAGNOSIS — S81011A Laceration without foreign body, right knee, initial encounter: Secondary | ICD-10-CM | POA: Insufficient documentation

## 2023-08-12 DIAGNOSIS — S0990XA Unspecified injury of head, initial encounter: Secondary | ICD-10-CM | POA: Diagnosis not present

## 2023-08-12 DIAGNOSIS — R031 Nonspecific low blood-pressure reading: Secondary | ICD-10-CM

## 2023-08-12 DIAGNOSIS — Z96651 Presence of right artificial knee joint: Secondary | ICD-10-CM | POA: Insufficient documentation

## 2023-08-12 DIAGNOSIS — I951 Orthostatic hypotension: Secondary | ICD-10-CM | POA: Insufficient documentation

## 2023-08-12 DIAGNOSIS — N1831 Chronic kidney disease, stage 3a: Secondary | ICD-10-CM | POA: Diagnosis present

## 2023-08-12 DIAGNOSIS — I480 Paroxysmal atrial fibrillation: Secondary | ICD-10-CM | POA: Insufficient documentation

## 2023-08-12 DIAGNOSIS — M1712 Unilateral primary osteoarthritis, left knee: Secondary | ICD-10-CM | POA: Diagnosis not present

## 2023-08-12 DIAGNOSIS — M25562 Pain in left knee: Secondary | ICD-10-CM | POA: Diagnosis not present

## 2023-08-12 DIAGNOSIS — M19031 Primary osteoarthritis, right wrist: Secondary | ICD-10-CM | POA: Diagnosis not present

## 2023-08-12 LAB — I-STAT CHEM 8, ED
BUN: 28 mg/dL — ABNORMAL HIGH (ref 8–23)
Calcium, Ion: 0.99 mmol/L — ABNORMAL LOW (ref 1.15–1.40)
Chloride: 106 mmol/L (ref 98–111)
Creatinine, Ser: 1.4 mg/dL — ABNORMAL HIGH (ref 0.61–1.24)
Glucose, Bld: 107 mg/dL — ABNORMAL HIGH (ref 70–99)
HCT: 36 % — ABNORMAL LOW (ref 39.0–52.0)
Hemoglobin: 12.2 g/dL — ABNORMAL LOW (ref 13.0–17.0)
Potassium: 4 mmol/L (ref 3.5–5.1)
Sodium: 139 mmol/L (ref 135–145)
TCO2: 21 mmol/L — ABNORMAL LOW (ref 22–32)

## 2023-08-12 LAB — COMPREHENSIVE METABOLIC PANEL
ALT: 17 U/L (ref 0–44)
AST: 26 U/L (ref 15–41)
Albumin: 3.1 g/dL — ABNORMAL LOW (ref 3.5–5.0)
Alkaline Phosphatase: 60 U/L (ref 38–126)
Anion gap: 15 (ref 5–15)
BUN: 27 mg/dL — ABNORMAL HIGH (ref 8–23)
CO2: 17 mmol/L — ABNORMAL LOW (ref 22–32)
Calcium: 8.8 mg/dL — ABNORMAL LOW (ref 8.9–10.3)
Chloride: 107 mmol/L (ref 98–111)
Creatinine, Ser: 1.33 mg/dL — ABNORMAL HIGH (ref 0.61–1.24)
GFR, Estimated: 51 mL/min — ABNORMAL LOW (ref >60)
Glucose, Bld: 110 mg/dL — ABNORMAL HIGH (ref 70–99)
Potassium: 4.2 mmol/L (ref 3.5–5.1)
Sodium: 139 mmol/L (ref 135–145)
Total Bilirubin: 0.9 mg/dL (ref <1.2)
Total Protein: 6.1 g/dL — ABNORMAL LOW (ref 6.5–8.1)

## 2023-08-12 LAB — CBC
HCT: 39.5 % (ref 39.0–52.0)
Hemoglobin: 12.9 g/dL — ABNORMAL LOW (ref 13.0–17.0)
MCH: 35.3 pg — ABNORMAL HIGH (ref 26.0–34.0)
MCHC: 32.7 g/dL (ref 30.0–36.0)
MCV: 108.2 fL — ABNORMAL HIGH (ref 80.0–100.0)
Platelets: 218 10*3/uL (ref 150–400)
RBC: 3.65 MIL/uL — ABNORMAL LOW (ref 4.22–5.81)
RDW: 13.1 % (ref 11.5–15.5)
WBC: 5.9 10*3/uL (ref 4.0–10.5)
nRBC: 0 % (ref 0.0–0.2)

## 2023-08-12 LAB — SAMPLE TO BLOOD BANK

## 2023-08-12 MED ORDER — TETANUS-DIPHTH-ACELL PERTUSSIS 5-2.5-18.5 LF-MCG/0.5 IM SUSY
0.5000 mL | PREFILLED_SYRINGE | Freq: Once | INTRAMUSCULAR | Status: AC
  Administered 2023-08-12: 0.5 mL via INTRAMUSCULAR
  Filled 2023-08-12: qty 0.5

## 2023-08-12 MED ORDER — ROSUVASTATIN CALCIUM 5 MG PO TABS
10.0000 mg | ORAL_TABLET | Freq: Every day | ORAL | Status: DC
  Administered 2023-08-13: 10 mg via ORAL
  Filled 2023-08-12: qty 2

## 2023-08-12 MED ORDER — BUDESONIDE 0.25 MG/2ML IN SUSP
0.2500 mg | Freq: Two times a day (BID) | RESPIRATORY_TRACT | Status: DC
  Administered 2023-08-13: 0.25 mg via RESPIRATORY_TRACT
  Filled 2023-08-12: qty 2

## 2023-08-12 MED ORDER — LACTATED RINGERS IV BOLUS
1000.0000 mL | Freq: Once | INTRAVENOUS | Status: AC
  Administered 2023-08-12: 1000 mL via INTRAVENOUS

## 2023-08-12 MED ORDER — ONDANSETRON HCL 4 MG/2ML IJ SOLN: 4.0000 mg | Freq: Four times a day (QID) | INTRAMUSCULAR | Status: DC | PRN

## 2023-08-12 MED ORDER — ACETAMINOPHEN 650 MG RE SUPP: 650.0000 mg | Freq: Four times a day (QID) | RECTAL | Status: DC | PRN

## 2023-08-12 MED ORDER — ACETAMINOPHEN 500 MG PO TABS
1000.0000 mg | ORAL_TABLET | Freq: Once | ORAL | Status: AC
Start: 2023-08-12 — End: 2023-08-12
  Administered 2023-08-12: 1000 mg via ORAL
  Filled 2023-08-12: qty 2

## 2023-08-12 MED ORDER — SENNOSIDES-DOCUSATE SODIUM 8.6-50 MG PO TABS: 1.0000 | ORAL_TABLET | Freq: Every evening | ORAL | Status: DC | PRN

## 2023-08-12 MED ORDER — ACETAMINOPHEN 325 MG PO TABS
650.0000 mg | ORAL_TABLET | Freq: Four times a day (QID) | ORAL | Status: DC | PRN
  Administered 2023-08-13: 650 mg via ORAL
  Filled 2023-08-12: qty 2

## 2023-08-12 MED ORDER — TAFAMIDIS 61 MG PO CAPS: 1.0000 | ORAL_CAPSULE | Freq: Every day | ORAL | Status: DC

## 2023-08-12 MED ORDER — ONDANSETRON HCL 4 MG PO TABS: 4.0000 mg | ORAL_TABLET | Freq: Four times a day (QID) | ORAL | Status: DC | PRN

## 2023-08-12 MED ORDER — LIDOCAINE-EPINEPHRINE (PF) 2 %-1:200000 IJ SOLN
20.0000 mL | Freq: Once | INTRAMUSCULAR | Status: AC
  Administered 2023-08-12: 20 mL
  Filled 2023-08-12: qty 20

## 2023-08-12 MED ORDER — SODIUM CHLORIDE 0.9% FLUSH
3.0000 mL | Freq: Two times a day (BID) | INTRAVENOUS | Status: DC
  Administered 2023-08-12 – 2023-08-13 (×2): 3 mL via INTRAVENOUS

## 2023-08-12 MED ORDER — AMIODARONE HCL 200 MG PO TABS
200.0000 mg | ORAL_TABLET | Freq: Every day | ORAL | Status: DC
  Administered 2023-08-13: 200 mg via ORAL
  Filled 2023-08-12: qty 1

## 2023-08-12 NOTE — ED Provider Notes (Signed)
[place] EMERGENCY DEPARTMENT AT [facility] Provider Note   CSN: [id number] Arrival date & time: 08/12/23  1441     History  Chief Complaint  Patient presents with   Daniel Yu is a 89 y.o. male.  Pt s/p fall at home. Pt is on eliquis and did hit head. Pt was sitting on stool, spouse was about to change LUE wound dressing (denies pain, nausea, feeling warm/flushed or other preceding signs or symptoms), when patient suddenly passed out, falling forward, hitting head, forehead lac, abrasions to hands and bil knees, c/o right wrist and right knee pain. Last tetanus not known. States felt fine, at baseline when got up this AM. No preceding chest pain or discomfort. No palpitations. No sob or unusual doe. No fever or chills. No abd pain or nvd. No recent blood loss, rectal bleeding or melena. No dysuria or gu c/o. No recent change in meds. Ems notes initial bp low, 70/50, but now normal.   The history is provided by the patient, the EMS personnel and medical records.  Fall Pertinent negatives include no chest pain, no abdominal pain, no headaches and no shortness of breath.       Home Medications Prior to Admission medications   Medication Sig Start Date End Date Taking? Authorizing Provider  amiodarone (PACERONE) 200 MG tablet Take 1 tablet (200 mg total) by mouth daily. 01/08/23   Lewayne Bunting, MD  cetirizine (ZYRTEC) 10 MG tablet Take 10 mg by mouth in the morning.    [provider]  ELIQUIS 5 MG TABS tablet TAKE 1 TABLET(5 MG) BY MOUTH TWICE DAILY 05/14/23   Lewayne Bunting, MD  FARXIGA 10 MG TABS tablet Take 1 tablet (10 mg total) by mouth daily before breakfast. 10/20/22   Jens Som, Madolyn Frieze, MD  Fluticasone Furoate (ARNUITY ELLIPTA) 100 MCG/ACT AEPB 1 puff Inhalation twice daily for 30 days 10/19/22   [provider]  furosemide (LASIX) 20 MG tablet TAKE 2 TABLETS(40 MG) BY MOUTH DAILY 05/29/23   Lewayne Bunting, MD  losartan  (COZAAR) 50 MG tablet Take 50 mg by mouth daily.    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 07/03/23   Lewayne Bunting, MD      Allergies    Atorvastatin, Bee venom, and Ivp dye [iodinated contrast media]    Review of Systems   Review of Systems  Constitutional:  Negative for chills and fever.  HENT:  Negative for sore throat.   Eyes:  Negative for pain, redness and visual disturbance.  Respiratory:  Negative for cough and shortness of breath.   Cardiovascular:  Negative for chest pain, palpitations and leg swelling.  Gastrointestinal:  Negative for abdominal pain, blood in stool, nausea and vomiting.  Genitourinary:  Negative for dysuria and flank pain.  Musculoskeletal:  Negative for back pain and neck pain.  Skin:  Positive for wound.  Neurological:  Negative for speech difficulty, weakness, numbness and headaches.  Psychiatric/Behavioral:  Negative for confusion.     Physical Exam Updated Vital Signs BP 118/66   Pulse 68   Temp  98.6 F (37 C) (Oral)   Resp 18   Ht 1.778 m (5\' 10" )   Wt 89 kg   SpO2 100%   BMI 28.15 kg/m  Physical Exam Vitals and nursing note reviewed.  Constitutional:      Appearance: Normal appearance. He is well-developed.  HENT:     Head:     Comments: Contusion and laceration to forehead.     Nose: Nose normal.     Mouth/Throat:     Mouth: Mucous membranes are moist.     Pharynx: Oropharynx is clear.  Eyes:     General: No scleral icterus.    Extraocular Movements: Extraocular movements intact.     Conjunctiva/sclera: Conjunctivae normal.     Pupils: Pupils are equal, round, and reactive to  light.  Neck:     Vascular: No carotid bruit.     Trachea: No tracheal deviation.  Cardiovascular:     Rate and Rhythm: Normal rate and regular rhythm.     Pulses: Normal pulses.     Heart sounds: Normal heart sounds. No murmur heard.    No friction rub. No gallop.  Pulmonary:     Effort: Pulmonary effort is normal. No accessory muscle usage or respiratory distress.     Breath sounds: Normal breath sounds.  Chest:     Chest wall: No tenderness.  Abdominal:     General: Bowel sounds are normal. There is no distension.     Palpations: Abdomen is soft.     Tenderness: There is no abdominal tenderness. There is no guarding.     Comments: No abd contusion, pain, bruising, or tenderness.   Genitourinary:    Comments: No cva tenderness. Musculoskeletal:        General: No swelling.     Cervical back: Normal range of motion and neck supple. No rigidity.     Comments: CTLS spine, non tender, aligned, no step off. Tenderness right wrist and right knee, otherwise good rom bil extremities without pain or other focal bony tenderness. Right knee grossly stable, no large effusion. Abrasions and skin tears to bil hands and bil knees. Laceration to right knee. Distal pulses palp bilateral extremities.  Laceration to right knee has overlying very superficial v-shaped skin tear. Beneath skin tear is subcutaneous laceration, amenable to closure. No bone exposed. (If patient flexes/bends knees feel likely sutures may tear through, will give knee immobilizer for when up/around to help promote wound healing).  Prior left elbow wound/sutures, intact, without sign of infection.   Skin:    General: Skin is warm and dry.     Findings: No rash.  Neurological:     Mental Status: He is alert.     Comments: Alert, speech clear. GCS 15. Motor/sens grossly intact bil. Stre 5/5.   Psychiatric:        Mood and Affect: Mood normal.     ED Results / Procedures / Treatments   Labs (all labs ordered are listed, but  only abnormal results are displayed) Results for orders placed or performed during the hospital encounter of 08/12/23  Comprehensive metabolic panel   Collection Time: 08/12/23  2:57 PM  Result Value Ref Range   Sodium 139 135 - 145 mmol/L   Potassium 4.2 3.5 - 5.1 mmol/L   Chloride 107 98 - 111 mmol/L   CO2 17 (L) 22 - 32 mmol/L   Glucose, Bld 110 (H) 70 - 99 mg/dL   BUN 27 (H) 8 - 23 mg/dL   Creatinine,  Ser 1.33 (H) 0.61 - 1.24 mg/dL   Calcium 8.8 (L) 8.9 - 10.3 mg/dL   Total Protein 6.1 (L) 6.5 - 8.1 g/dL   Albumin 3.1 (L) 3.5 - 5.0 g/dL   AST 26 15 - 41 U/L   ALT 17 0 - 44 U/L   Alkaline Phosphatase 60 38 - 126 U/L   Total Bilirubin 0.9 <1.2 mg/dL   GFR, Estimated 51 (L) >60 mL/min   Anion gap 15 5 - 15  CBC   Collection Time: 08/12/23  2:57 PM  Result Value Ref Range   WBC 5.9 4.0 - 10.5 K/uL   RBC 3.65 (L) 4.22 - 5.81 MIL/uL   Hemoglobin 12.9 (L) 13.0 - 17.0 g/dL   HCT 16.1 09.6 - 04.5 %   MCV 108.2 (H) 80.0 - 100.0 fL   MCH 35.3 (H) 26.0 - 34.0 pg   MCHC 32.7 30.0 - 36.0 g/dL   RDW 40.9 81.1 - 91.4 %   Platelets 218 150 - 400 K/uL   nRBC 0.0 0.0 - 0.2 %  Sample to Blood Bank   Collection Time: 08/12/23  3:01 PM  Result Value Ref Range   Blood Bank Specimen SAMPLE AVAILABLE FOR TESTING    Sample Expiration      08/15/2023,2359 Performed at [facility], [address], [place], [place] [address]   I-Stat Chem 8, ED   Collection Time: 08/12/23  3:04 PM  Result Value Ref Range   Sodium 139 135 - 145 mmol/L   Potassium 4.0 3.5 - 5.1 mmol/L   Chloride 106 98 - 111 mmol/L   BUN 28 (H) 8 - 23 mg/dL   Creatinine, Ser 6.21 (H) 0.61 - 1.24 mg/dL   Glucose, Bld 308 (H) 70 - 99 mg/dL   Calcium, Ion 6.57 (L) 1.15 - 1.40 mmol/L   TCO2 21 (L) 22 - 32 mmol/L   Hemoglobin 12.2 (L) 13.0 - 17.0 g/dL   HCT 84.6 (L) 96.2 - 95.2 %      EKG EKG Interpretation Date/Time:  Sunday August 12 2023 15:01:44 EST Ventricular Rate:  69 PR Interval:    QRS  Duration:  104 QT Interval:  550 QTC Calculation: 590 R Axis:   46  Text Interpretation: Sinus rhythm Nonspecific ST abnormality Prolonged QT interval Confirmed by Cathren Laine [phone]) on 08/12/2023 3:15:24 PM  Radiology CT HEAD WO CONTRAST Result Date: 08/12/2023 CLINICAL DATA:  Trauma, fall, syncope, on blood thinners EXAM: CT HEAD WITHOUT CONTRAST CT MAXILLOFACIAL WITHOUT CONTRAST CT CERVICAL SPINE WITHOUT CONTRAST TECHNIQUE: Multidetector CT imaging of the head, cervical spine, and maxillofacial structures were performed using the standard protocol without intravenous contrast. Multiplanar CT image reconstructions of the cervical spine and maxillofacial structures were also generated. RADIATION DOSE REDUCTION: This exam was performed according to the departmental dose-optimization program which includes automated exposure control, adjustment of the mA and/or kV according to patient size and/or use of iterative reconstruction technique. COMPARISON:  07/09/2022 FINDINGS: CT HEAD FINDINGS Brain: No evidence of acute infarction, hemorrhage, hydrocephalus, extra-axial collection or mass lesion/mass effect. Periventricular white matter hypodensity. Vascular: No hyperdense vessel or unexpected calcification. CT FACIAL BONES FINDINGS Skull: Normal. Negative for fracture or focal lesion. Facial bones: Mildly displaced, angulated fractures of the nasal bones (series 4, image 70). Sinuses/Orbits: Chronic mucosal thickening of the paranasal sinuses and ethmoid air cells with underlying bony sinus wall thickening. Other: Soft tissue contusion and laceration of the midline forehead (series 3, image 16). Soft tissue contusion of the  nose. CT CERVICAL SPINE FINDINGS Alignment: Degenerative straightening of the normal cervical lordosis. Skull base and vertebrae: No acute fracture. No primary bone lesion or focal pathologic process. Soft tissues and spinal canal: No prevertebral fluid or swelling. No visible canal  hematoma. Disc levels: Moderate to severe multilevel disc degenerative disease and osteophytosis, worst at C5-C6. Upper chest: Negative. Other: None. IMPRESSION: 1. No acute intracranial pathology. 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 2. Mildly displaced, angulated fractures of the nasal bones. 3. Soft tissue contusion and laceration of the midline forehead. Soft tissue contusion of the nose. 4. No fracture or static subluxation of the cervical spine. 5. Moderate to severe multilevel cervical disc degenerative disease. Electronically Signed   By: Jearld Lesch M.D.   On: 08/12/2023 15:48   CT MAXILLOFACIAL WO CONTRAST Result Date: 08/12/2023 CLINICAL DATA:  Trauma, fall, syncope, on blood thinners EXAM: CT HEAD WITHOUT CONTRAST CT MAXILLOFACIAL WITHOUT CONTRAST CT CERVICAL SPINE WITHOUT CONTRAST TECHNIQUE: Multidetector CT imaging of the head, cervical spine, and maxillofacial structures were performed using the standard protocol without intravenous contrast. Multiplanar CT image reconstructions of the cervical spine and maxillofacial structures were also generated. RADIATION DOSE REDUCTION: This exam was performed according to the departmental dose-optimization program which includes automated exposure control, adjustment of the mA and/or kV according to patient size and/or use of iterative reconstruction technique. COMPARISON:  07/09/2022 FINDINGS: CT HEAD FINDINGS Brain: No evidence of acute infarction, hemorrhage, hydrocephalus, extra-axial collection or mass lesion/mass effect. Periventricular white matter hypodensity. Vascular: No hyperdense vessel or unexpected calcification. CT FACIAL BONES FINDINGS Skull: Normal. Negative for fracture or focal lesion. Facial bones: Mildly displaced, angulated fractures of the nasal bones (series 4, image 70). Sinuses/Orbits: Chronic mucosal thickening of the paranasal sinuses and ethmoid air cells with underlying bony sinus wall  thickening. Other: Soft tissue contusion and laceration of the midline forehead (series 3, image 16). Soft tissue contusion of the nose. CT CERVICAL SPINE FINDINGS Alignment: Degenerative straightening of the normal cervical lordosis. Skull base and vertebrae: No acute fracture. No primary bone lesion or focal pathologic process. Soft tissues and spinal canal: No prevertebral fluid or swelling. No visible canal hematoma. Disc levels: Moderate to severe multilevel disc degenerative disease and osteophytosis, worst at C5-C6. Upper chest: Negative. Other: None. IMPRESSION: 1. No acute intracranial pathology. 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 2. Mildly displaced, angulated fractures of the nasal bones. 3. Soft tissue contusion and laceration of the midline forehead. Soft tissue contusion of the nose. 4. No fracture or static subluxation of the cervical spine. 5. Moderate to severe multilevel cervical disc degenerative disease. Electronically Signed   By: Jearld Lesch M.D.   On: 08/12/2023 15:48   CT CERVICAL SPINE WO CONTRAST Result Date: 08/12/2023 CLINICAL DATA:  Trauma, fall, syncope, on blood thinners EXAM: CT HEAD WITHOUT CONTRAST CT MAXILLOFACIAL WITHOUT CONTRAST CT CERVICAL SPINE WITHOUT CONTRAST TECHNIQUE: Multidetector CT imaging of the head, cervical spine, and maxillofacial structures were performed using the standard protocol without intravenous contrast. Multiplanar CT image reconstructions of the cervical spine and maxillofacial structures were also generated. RADIATION DOSE REDUCTION: This exam was performed according to the departmental dose-optimization program which includes automated exposure control, adjustment of the mA and/or kV according to patient size and/or use of iterative reconstruction technique. COMPARISON:  07/09/2022 FINDINGS: CT HEAD FINDINGS Brain: No evidence of acute infarction, hemorrhage, hydrocephalus, extra-axial collection or mass  lesion/mass effect. Periventricular white matter hypodensity. Vascular: No hyperdense vessel  or unexpected calcification. CT FACIAL BONES FINDINGS Skull: Normal. Negative for fracture or focal lesion. Facial bones: Mildly displaced, angulated fractures of the nasal bones (series 4, image 70). Sinuses/Orbits: Chronic mucosal thickening of the paranasal sinuses and ethmoid air cells with underlying bony sinus wall thickening. Other: Soft tissue contusion and laceration of the midline forehead (series 3, image 16). Soft tissue contusion of the nose. CT CERVICAL SPINE FINDINGS Alignment: Degenerative straightening of the normal cervical lordosis. Skull base and vertebrae: No acute fracture. No primary bone lesion or focal pathologic process. Soft tissues and spinal canal: No prevertebral fluid or swelling. No visible canal hematoma. Disc levels: Moderate to severe multilevel disc degenerative disease and osteophytosis, worst at C5-C6. Upper chest: Negative. Other: None. IMPRESSION: 1. No acute intracranial pathology. 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 2. Mildly displaced, angulated fractures of the nasal bones. 3. Soft tissue contusion and laceration of the midline forehead. Soft tissue contusion of the nose. 4. No fracture or static subluxation of the cervical spine. 5. Moderate to severe multilevel cervical disc degenerative disease. Electronically Signed   By: Jearld Lesch M.D.   On: 08/12/2023 15:48   DG Knee Right Port Result Date: 08/12/2023 CLINICAL DATA:  Fall.  Right knee pain. EXAM: PORTABLE RIGHT KNEE - 1-2 VIEW COMPARISON:  None Available. FINDINGS: Mild diffuse decreased bone mineralization. 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 No acute fracture or dislocation. No definite joint effusion. IMPRESSION: 1. No acute findings. 2. Previous right total knee arthroplasty intact. Electronically Signed   By: Elberta Fortis M.D.    On: 08/12/2023 15:41   DG Knee Left Port Result Date: 08/12/2023 CLINICAL DATA:  Fall with left knee pain. EXAM: PORTABLE LEFT KNEE - 1-2 VIEW COMPARISON:  None Available. FINDINGS: Minimal osteoarthritic change most prominent over the patellofemoral joint. No acute fracture or dislocation. No definite joint effusion. Three small well corticated bony fragments in the suprapatellar region with the largest measuring approximately 1.9 cm as these may represent intra-articular loose bodies versus extra-articular nonspecific soft tissue calcifications. 1.7 cm well corticated bony fragment adjacent the inferior pole of the patella on the lateral view likely due to enthesopathy and less likely a chip fracture. IMPRESSION: 1. Minimal osteoarthritic change. 2. 1.7 cm well corticated bony fragment adjacent the inferior pole of the patella on the lateral view likely due to enthesopathy and less likely a chip fracture. Electronically Signed   By: Elberta Fortis M.D.   On: 08/12/2023 15:40   DG Wrist Complete Right Result Date: 08/12/2023 CLINICAL DATA:  Fall with right wrist pain. EXAM: RIGHT WRIST - COMPLETE 3+ VIEW COMPARISON:  None Available. FINDINGS: Mild diffuse decreased bone mineralization. Mild degenerate changes over the radiocarpal joint and radial side of the carpal bones as well as first carpometacarpal joints. No acute fracture or dislocation. There is small vessel atherosclerotic disease present. IMPRESSION: 1. No acute findings. 2. Mild degenerative changes. Electronically Signed   By: Elberta Fortis M.D.   On: 08/12/2023 15:35   DG Pelvis Portable Result Date: 08/12/2023 CLINICAL DATA:  Fall. EXAM: PORTABLE PELVIS 1-2 VIEWS COMPARISON:  [facility] 01/07/2016, 01/04/2016 FINDINGS: There is diffuse decreased bone mineralization present. Mild symmetric degenerative changes of the hips. No acute fracture or dislocation. Degenerative change of the spine. IMPRESSION: 1. No acute findings. 2. Mild symmetric  degenerative changes of the hips. Electronically Signed   By: Elberta Fortis M.D.   On: 08/12/2023 15:34   DG  Chest Port 1 View Result Date: 08/12/2023 CLINICAL DATA:  Fall. EXAM: PORTABLE CHEST 1 VIEW COMPARISON:  07/09/2022 FINDINGS: Lungs are hypoinflated without focal airspace consolidation or effusion. No pneumothorax. Minimal prominence of the central perihilar markings likely due to the degree of hypoinflation. Cardiomediastinal silhouette and remainder of the exam is unchanged. No acute fracture. IMPRESSION: Hypoinflation without acute cardiopulmonary disease. Electronically Signed   By: Elberta Fortis M.D.   On: 08/12/2023 15:31    Procedures .Laceration Repair  Date/Time: 08/12/2023 4:58 PM  Performed by: Cathren Laine, MD Authorized by: Cathren Laine, MD   Consent:    Consent given by:  Patient Laceration details:    Location:  Face   Face location:  Forehead   Length (cm):  4 Pre-procedure details:    Preparation:  Patient was prepped and draped in usual sterile fashion and imaging obtained to evaluate for foreign bodies Exploration:    Imaging outcome: foreign body not noted     Wound exploration: entire depth of wound visualized     Contaminated: no   Treatment:    Area cleansed with:  Saline   Amount of cleaning:  Standard   Irrigation solution:  Sterile saline   Visualized foreign bodies/material removed: no     Layers/structures repaired:  Deep subcutaneous Deep subcutaneous:    Suture size:  4-0   Suture material:  Vicryl   Suture technique:  Simple interrupted   Number of sutures:  3 Skin repair:    Repair method:  Sutures   Suture size:  5-0   Suture material:  Prolene   Suture technique:  Simple interrupted   Number of sutures:  7 Approximation:    Approximation:  Close Repair type:    Repair type:  Intermediate Post-procedure details:    Dressing:  Antibiotic ointment   Procedure completion:  Tolerated well, no immediate complications .Laceration  Repair  Date/Time: 08/12/2023 5:11 PM  Performed by: Cathren Laine, MD Authorized by: Cathren Laine, MD   Consent:    Consent given by:  Patient Laceration details:    Location:  Leg   Leg location:  R knee   Length (cm):  4 Pre-procedure details:    Preparation:  Patient was prepped and draped in usual sterile fashion and imaging obtained to evaluate for foreign bodies Exploration:    Imaging outcome: foreign body not noted     Wound exploration: wound explored through full range of motion and entire depth of wound visualized   Treatment:    Area cleansed with:  Saline   Amount of cleaning:  Extensive   Irrigation solution:  Sterile saline Skin repair:    Repair method:  Sutures   Suture size:  3-0   Suture material:  Prolene   Suture technique:  Simple interrupted   Number of sutures:  5 Repair type:    Repair type:  Simple Post-procedure details:    Dressing:  Sterile dressing   Procedure completion:  Tolerated well, no immediate complications     Medications Ordered in ED Medications  acetaminophen (TYLENOL) tablet 1,000 mg (has no administration in time range)  Tdap (BOOSTRIX) injection 0.5 mL (0.5 mLs Intramuscular Given 08/12/23 1503)  lidocaine-EPINEPHrine (XYLOCAINE W/EPI) 2 %-1:200000 (PF) injection 20 mL (20 mLs Infiltration Given 08/12/23 1634)  lactated ringers bolus 1,000 mL (1,000 mLs Intravenous New Bag/Given 08/12/23 1503)    ED Course/ Medical Decision Making/ A&P  Medical Decision Making Problems Addressed: Closed fracture of nasal bone, initial encounter: acute illness or injury Contusion of face, initial encounter: acute illness or injury with systemic symptoms that poses a threat to life or bodily functions Current use of anticoagulant therapy: chronic illness or injury that poses a threat to life or bodily functions Laceration of face, initial encounter: acute illness or injury Laceration of right knee, initial  encounter: acute illness or injury Multiple skin tears: acute illness or injury Sinus bradycardia: acute illness or injury Syncope and collapse: acute illness or injury with systemic symptoms that poses a threat to life or bodily functions  Amount and/or Complexity of Data Reviewed Independent Historian: EMS    Details: Ems, family, hx External Data Reviewed: notes. Labs: ordered. Decision-making details documented in ED Course. Radiology: ordered and independent interpretation performed. Decision-making details documented in ED Course. ECG/medicine tests: ordered and independent interpretation performed. Decision-making details documented in ED Course.  Risk OTC drugs. Prescription drug management. Decision regarding hospitalization.   Iv ns. Continuous pulse ox and cardiac monitoring. Labs ordered/sent. Imaging ordered.   Differential diagnosis includes head injury/sdh, anemia, dehydration, syncope, etc. . Dispo decision including potential need for admission considered - will get labs and imaging and reassess.   Reviewed nursing notes and prior charts for additional history. External reports reviewed. Additional history from: EMS.  Initial bp low. ?possible mild vasovagal rxn/dressing change. From chart review, it appears recent baseline bp of pt in low 100s.   Tetanus IM. Morphine iv. Zofran iv.  Ivf bolus.   Cardiac monitor: sinus rhythm, rate 70.  Labs reviewed/interpreted by me - wbc and hct normal. Na/k normal.   Xrays reviewed/interpreted by me - no fx.   CT reviewed/interpreted by me - no hem. +nasal fx.   Recheck c spine non tender, aligned. Abd soft nt. Breathing comfortable. Denies needing any pain med currently.   Sterile dressings to skin tears. Knee immobilizer when up/about to help protect right knee laceration/repair.   Recheck no pain, no faintness or dizziness. No sob. No headache.   Po fluids/food. Ambulate in hall.   On monitor during stay, hr as low  as 45, sinus. Bp on arrival soft and last few office bp readings in sbp 100 range.   Given bradycardia, low blood pressure via EMS with syncope, and no preceding 'benign/vagal-type of symptoms or benign explanation for syncope, will consult medicine for admission, monitoring, possible cardiology consult in AM to eval, including meds.   Hospitalists consulted for admission.            Final Clinical Impression(s) / ED Diagnoses Final diagnoses:  None    Rx / DC Orders ED Discharge Orders     None         Cathren Laine, MD 08/12/23 1810

## 2023-08-12 NOTE — ED Notes (Signed)
Meal at bedside, pt refused.

## 2023-08-12 NOTE — H&P (Signed)
History and Physical    Daniel Yu ZOX:[id number] DOB: [birth year] DOA: 08/12/2023  PCP: Emilio Aspen, MD  Patient coming from: Home  I have personally briefly reviewed patient's old medical records in [facility]  Chief Complaint: Syncope  HPI: Daniel Yu is a 89 y.o. male with medical history significant for PAF on Eliquis, chronic diastolic CHF, cardiac amyloidosis, nonobstructive CAD, history of bradycardia, CKD stage IIIa, HTN, HLD who presented to the ED for evaluation after a syncopal episode at home.  Patient states he was eating lunch earlier today while watching golf at home.  He had a beer with lunch.  He recently had a procedure to his left elbow for a skin cancer requiring wound dressing changes.  He stood up and walked over to the kitchen and sat on one of the barstool so that his spouse could change the wound dressing.  Soon after sitting down, he lost consciousness and fell forwards.  He hit his forehead on the counter and then fell to the ground.  This resulted in a forehead laceration, large skin tear to his right forearm, as well as a right knee laceration in addition to other small abrasions and skin tears to both hands and left knee.  He remembers waking up on the floor seeing blood.  He denied any associated chest pain, palpitations, lightheadedness/dizziness, dyspnea.  He states he took all of his morning medications today.  He recalls having a similar episode 1 year ago.  He notes that his blood pressure has been running relatively low with SBP around 100 on recent office visits.  EMS were called to the house.  Per ED documentation initial BP was 70/50.  He was brought to the ED for further evaluation.  ED Course  Labs/Imaging on admission: I have personally reviewed following labs and imaging studies.  Initial vitals showed BP 119/72, pulse 70, RR 16, temp 98.6 F, SpO2 100% on room air.  Labs showed WBC 5.9, hemoglobin 12.9, platelets 218,000,  sodium 139, potassium 4.2, bicarb 17, BUN 27, creatinine 1.33, serum glucose 110, LFTs within normal limits.  Extensive traumatic injury imaging obtained.  CT head negative for acute intracranial pathology.  CT C-spine negative for fracture or subluxation of the C-spine.  CT maxillofacial notable for mildly displaced, angulated fractures of the nasal bones.  Imaging otherwise negative for significant injury or acute pathology.  Patient was given 1 L LR.  Forehead and right knee lacerations were repaired in the ED.  Right knee immobilizer was placed to promote right knee wound healing.  The hospitalist service was consulted to admit for further evaluation and management.  Review of Systems: All systems reviewed and are negative except as documented in history of present illness above.   Past Medical History:  Diagnosis Date   Arthritis    right knee oa   Asthma    Carpal tunnel syndrome    BILATERAL   Cholecystitis    Complication of anesthesia    adverse reaction to the spinal   Hypertension    PAF (paroxysmal atrial fibrillation) (HCC)    SBO (small bowel obstruction) (HCC) 12/2015    Past Surgical History:  Procedure Laterality Date   CARDIOVERSION N/A 02/14/2017   Procedure: CARDIOVERSION;  Surgeon: Wendall Stade, MD;  Location: [facility] ENDOSCOPY;  Service: Cardiovascular;  Laterality: N/A;   CARDIOVERSION N/A 12/11/2019   Procedure: CARDIOVERSION;  Surgeon: Lewayne Bunting, MD;  Location: [facility] ENDOSCOPY;  Service: Cardiovascular;  Laterality: N/A;  CARDIOVERSION N/A 02/13/2022   Procedure: CARDIOVERSION;  Surgeon: Lewayne Bunting, MD;  Location: [facility] ENDOSCOPY;  Service: Cardiovascular;  Laterality: N/A;   CHOLECYSTECTOMY  02/09/2017   CHOLECYSTECTOMY N/A 02/09/2017   Procedure: LAPAROSCOPIC CHOLECYSTECTOMY WITH INTRAOPERATIVE CHOLANGIOGRAM;  Surgeon: Manus Rudd, MD;  Location: [facility] OR;  Service: General;  Laterality: N/A;   COLONOSCOPY WITH PROPOFOL N/A 11/09/2014   Procedure:  COLONOSCOPY WITH PROPOFOL;  Surgeon: Charolett Bumpers, MD;  Location: [facility] ENDOSCOPY;  Service: Endoscopy;  Laterality: N/A;   HERNIA REPAIR     2013   LEFT HEART CATH AND CORONARY ANGIOGRAPHY N/A 05/23/2022   Procedure: LEFT HEART CATH AND CORONARY ANGIOGRAPHY;  Surgeon: Swazil[place], Peter M, MD;  Location: [facility] INVASIVE CV LAB;  Service: Cardiovascular;  Laterality: N/A;   QUADRICEPS TENDON REPAIR Left 06/07/2015   Procedure: REPAIR LEFT QUADRICEP TENDON;  Surgeon: Ollen Gross, MD;  Location: [facility] ORS;  Service: Orthopedics;  Laterality: Left;   TEE WITHOUT CARDIOVERSION N/A 02/14/2017   Procedure: TRANSESOPHAGEAL ECHOCARDIOGRAM (TEE);  Surgeon: Wendall Stade, MD;  Location: [facility] ENDOSCOPY;  Service: Cardiovascular;  Laterality: N/A;   TOTAL KNEE ARTHROPLASTY Right 12/27/2015   Procedure: RIGHT TOTAL KNEE ARTHROPLASTY;  Surgeon: Ollen Gross, MD;  Location: [facility] ORS;  Service: Orthopedics;  Laterality: Right;    Social History:  reports that he has never smoked. He has never used smokeless tobacco. He reports current alcohol use. He reports that he does not use drugs.  Allergies  Allergen Reactions   Atorvastatin Hives   Bee Venom Swelling and Rash   Ivp Dye [Iodinated Contrast Media] Rash    Family History  Problem Relation Age of Onset   Hypertension Father      Prior to Admission medications   Medication Sig Start Date End Date Taking? Authorizing Provider  amiodarone (PACERONE) 200 MG tablet Take 1 tablet (200 mg total) by mouth daily. 01/08/23  Yes Lewayne Bunting, MD  cetirizine (ZYRTEC) 10 MG tablet Take 10 mg by mouth at bedtime.   Yes [provider]  cyanocobalamin (VITAMIN B12) 1000 MCG tablet Take 1,000 mcg by mouth every other day.   Yes [provider]  ELIQUIS 5 MG TABS tablet TAKE 1 TABLET(5 MG) BY MOUTH TWICE DAILY 05/14/23  Yes Crenshaw, Madolyn Frieze, MD  FARXIGA 10 MG TABS tablet Take 1 tablet (10 mg total) by mouth daily before breakfast. 10/20/22  Yes Crenshaw,  Madolyn Frieze, MD  Fluticasone Furoate (ARNUITY ELLIPTA) 100 MCG/ACT AEPB Inhale 1 puff into the lungs in the morning. 10/19/22  Yes [provider]  furosemide (LASIX) 20 MG tablet TAKE 2 TABLETS(40 MG) BY MOUTH DAILY 05/29/23  Yes Crenshaw, Madolyn Frieze, MD  losartan (COZAAR) 50 MG tablet Take 50 mg by mouth daily.   Yes [provider]  rosuvastatin (CRESTOR) 10 MG tablet TAKE 1 TABLET(10 MG) BY MOUTH DAILY 07/11/23  Yes Crenshaw, Madolyn Frieze, MD  spironolactone (ALDACTONE) 25 MG tablet TAKE 1 TABLET(25 MG) BY MOUTH DAILY Patient taking differently: 12.5 mg alternating with 25 mg every other day 06/04/23  Yes Crenshaw, Madolyn Frieze, MD  Tafamidis (VYNDAMAX) 61 MG CAPS Take 1 capsule (61 mg total) by mouth daily. 07/03/23  Yes Lewayne Bunting, MD  doxycycline (VIBRAMYCIN) 100 MG capsule Take 100 mg by mouth 2 (two) times daily. Patient not taking: Reported on 08/12/2023 08/06/23   [provider]    Physical Exam: Vitals:   08/12/23 1515 08/12/23 1715 08/12/23 1730 08/12/23 1745  BP: 118/66 (!) 142/86 Marland Kitchen)  143/75 (!) 142/71  Pulse: 68 71 73 72  Resp: 18 18 20 12   Temp:      TempSrc:      SpO2: 100% 97% 100% 98%  Weight:      Height:       Constitutional: Resting in bed, NAD, calm, comfortable Eyes: EOMI, lids and conjunctivae normal ENMT: Mucous membranes are moist. Posterior pharynx clear of any exudate or lesions.Normal dentition.  Slight bruising of the nose. Neck: normal, supple, no masses. Respiratory: clear to auscultation bilaterally, no wheezing, no crackles. Normal respiratory effort. No accessory muscle use.  Cardiovascular: Regular rate and rhythm, no murmurs / rubs / gallops. No extremity edema. 2+ pedal pulses. Abdomen: no tenderness, no masses palpated.  Musculoskeletal: no clubbing / cyanosis. No joint deformity upper and lower extremities.  Right knee immobilizer in place otherwise good ROM, no contractures. Normal muscle tone.  Skin: Forehead laceration s/p  repair, skin tear right forearm/hand with wound dressing in place, right knee immobilizer in place Neurologic: Sensation intact. Strength 5/5 in all 4.  Psychiatric: Normal judgment and insight. Alert and oriented x 3. Normal mood.   EKG: Personally reviewed. Sinus rhythm, rate 69, no acute ischemic changes.  Assessment/Plan Principal Problem:   Syncope and collapse Active Problems:   Paroxysmal atrial fibrillation (HCC)   Chronic diastolic CHF (congestive heart failure) (HCC)   Cardiac amyloidosis (HCC)   Chronic kidney disease, stage 3a (HCC)   Essential hypertension   Nasal bones, closed fracture   Daniel Yu is a 89 y.o. male with medical history significant for PAF on Eliquis, chronic diastolic CHF, cardiac amyloidosis, nonobstructive CAD, history of bradycardia, CKD stage IIIa, HTN, HLD who is admitted for syncope evaluation.  Assessment and Plan: Syncope: Suspect this was syncope related to orthostatic hypotension.  He reports similar episode last year.  He was noted to be hypotensive with EMS.  BP stabilized after receiving 1 L IV fluids.  He is scheduled for updated echocardiogram this coming Friday. -Keep on telemetry -Will go ahead and obtain echocardiogram while in hospital -Check orthostatic vitals -PT/OT eval, fall precautions -Will hold losartan, spironolactone, Lasix, Farxiga for now  Paroxysmal atrial fibrillation: Stable controlled rate.  Not on beta-blocker or diltiazem due to history of bradycardia.  Continue amiodarone.  Hold Eliquis tonight given multiple skin lacerations with bleeding.  Chronic diastolic CHF/cardiac amyloidosis: Appears euvolemic. -Holding Lasix, spironolactone, losartan, Farxiga as above -Update echocardiogram -Continue Vyndamax  CKD stage IIIa: Renal function stable.  Monitor.  Hypertension: Holding antihypertensives due to hypotension PTA.  Hyperlipidemia: Continue rosuvastatin.  Forehead laceration s/p repair in ED Right  knee laceration s/p repair in ED Mildly displaced angulated fractures of the nasal bones: Occurring from injury after fall.  Forehead and right knee lacerations repaired in the ED.  Right knee immobilizer was placed to help with wound healing.  Will need outpatient follow-up for suture removals.   DVT prophylaxis: SCDs Start: 08/12/23 1943 Code Status: Full code, confirmed with patient on admission Family Communication: Spouse and daughter at bedside Disposition Plan: From home and likely discharge to home pending clinical progress Consults called: None Severity of Illness: The appropriate patient status for this patient is OBSERVATION. Observation status is judged to be reasonable and necessary in order to provide the required intensity of service to ensure the patient's safety. The patient's presenting symptoms, physical exam findings, and initial radiographic and laboratory data in the context of their medical condition is felt to place them at decreased risk for  further clinical deterioration. Furthermore, it is anticipated that the patient will be medically stable for discharge from the hospital within 2 midnights of admission.   Darreld Mclean MD [facility]  If 7PM-7AM, please contact night-coverage [email or web address]  08/12/2023, 7:57 PM

## 2023-08-12 NOTE — ED Triage Notes (Signed)
Pt to the ed from home via ems  with a CC of fall. Pt is on blood thinners. Pt passed out while sitting at bar stool. Pt hit head right wrist, right knee. Pt has GCS of 15.

## 2023-08-12 NOTE — Hospital Course (Addendum)
Daniel Yu is a 89 y.o. male with medical history significant for PAF on Eliquis, chronic diastolic CHF, cardiac amyloidosis, nonobstructive CAD, history of bradycardia, CKD stage IIIa, HTN, HLD who is admitted for syncope evaluation.

## 2023-08-12 NOTE — Progress Notes (Signed)
   08/12/23 1455  Spiritual Encounters  Type of Visit Initial  Care provided to: Pt not available  Referral source Trauma page  Reason for visit Trauma  OnCall Visit No   Chaplain responded to a level two trauma. The patient Daniel Yu, was attended to by the medical team.  No family is present. If a chaplain is requested someone will respond.   Valerie Roys [facility]  [phone]

## 2023-08-12 NOTE — Discharge Instructions (Addendum)
It was our pleasure to provide your ER care today - we hope that you feel better.  Drink plenty of fluids/stay well hydrated.   Keep wounds very clean. May shower, pad areas gently dry.   Avoid flexing right knee, as that could cause sutures to tear through and wound to open - you may use knee brace when up and about (but do not necessarily have to use if is uncomfortable or cumbersome or makes you feel unsteady on feet).   Fall precautions. If you begin to feel faint, sit or lie down immediately, so as to decrease risk of fainting, fall, and injury.   As we mentioned, at times your heart rate is slow (50), and blood pressure borderline low - follow up closely with your doctor/cardiologist in the coming week for recheck, and discuss your medications with them.   Have sutures removed from face in one week and from knee in two weeks.   For nose fracture, follow up with ENT doctor in 1-2 weeks if problems w appearance and/or function of nose - call office to arrange appointment.   Return to ER right away if worse, new symptoms, severe headache, persistent vomiting, chest pain, trouble breathing, weak/fainting, fevers, infection of wounds, or other concern.

## 2023-08-12 NOTE — Progress Notes (Signed)
Orthopedic Tech Progress Note Patient Details:  Daniel Yu [facility] 04-13-1932 [id number]  Ortho Devices Type of Ortho Device: Knee Immobilizer Ortho Device/Splint Location: RLE Ortho Device/Splint Interventions: Application, Adjustment   Post Interventions Patient Tolerated: Well Instructions Provided: Care of device, Adjustment of device  [name] 08/12/2023, 6:18 PM

## 2023-08-12 NOTE — Progress Notes (Signed)
Orthopedic Tech Progress Note Patient Details:  Daniel Yu [birth year] [id number]  Patient ID: Evans Lance, male   DOB: 1931-10-21, 89 y.o.   MRN: [id number] Level II; not currently needed. Darleen Crocker 08/12/2023, 3:03 PM

## 2023-08-13 ENCOUNTER — Observation Stay (HOSPITAL_COMMUNITY): Payer: Medicare Other

## 2023-08-13 DIAGNOSIS — R55 Syncope and collapse: Secondary | ICD-10-CM

## 2023-08-13 DIAGNOSIS — I951 Orthostatic hypotension: Secondary | ICD-10-CM | POA: Insufficient documentation

## 2023-08-13 LAB — URINALYSIS, ROUTINE W REFLEX MICROSCOPIC
Bacteria, UA: NONE SEEN
Bilirubin Urine: NEGATIVE
Glucose, UA: >=500 mg/dL — AB
Hgb urine dipstick: NEGATIVE
Ketones, ur: NEGATIVE mg/dL
Leukocytes,Ua: NEGATIVE
Nitrite: NEGATIVE
Protein, ur: NEGATIVE mg/dL
Specific Gravity, Urine: 1.02 (ref 1.005–1.030)
pH: 6 (ref 5.0–8.0)

## 2023-08-13 LAB — ECHOCARDIOGRAM COMPLETE
Area-P 1/2: 2.72 cm2
Height: 70 in
S' Lateral: 3.3 cm
Weight: 3139.35 [oz_av]

## 2023-08-13 LAB — CBC
HCT: 36.7 % — ABNORMAL LOW (ref 39.0–52.0)
Hemoglobin: 12.2 g/dL — ABNORMAL LOW (ref 13.0–17.0)
MCH: 34 pg (ref 26.0–34.0)
MCHC: 33.2 g/dL (ref 30.0–36.0)
MCV: 102.2 fL — ABNORMAL HIGH (ref 80.0–100.0)
Platelets: 215 10*3/uL (ref 150–400)
RBC: 3.59 MIL/uL — ABNORMAL LOW (ref 4.22–5.81)
RDW: 13 % (ref 11.5–15.5)
WBC: 8.3 10*3/uL (ref 4.0–10.5)
nRBC: 0 % (ref 0.0–0.2)

## 2023-08-13 LAB — BASIC METABOLIC PANEL
Anion gap: 8 (ref 5–15)
BUN: 24 mg/dL — ABNORMAL HIGH (ref 8–23)
CO2: 23 mmol/L (ref 22–32)
Calcium: 8.4 mg/dL — ABNORMAL LOW (ref 8.9–10.3)
Chloride: 105 mmol/L (ref 98–111)
Creatinine, Ser: 1.36 mg/dL — ABNORMAL HIGH (ref 0.61–1.24)
GFR, Estimated: 50 mL/min — ABNORMAL LOW (ref >60)
Glucose, Bld: 97 mg/dL (ref 70–99)
Potassium: 4.1 mmol/L (ref 3.5–5.1)
Sodium: 136 mmol/L (ref 135–145)

## 2023-08-13 LAB — CBG MONITORING, ED: Glucose-Capillary: 105 mg/dL — ABNORMAL HIGH (ref 70–99)

## 2023-08-13 MED ORDER — PERFLUTREN LIPID MICROSPHERE
1.0000 mL | INTRAVENOUS | Status: AC | PRN
  Administered 2023-08-13: 4 mL via INTRAVENOUS

## 2023-08-13 NOTE — Plan of Care (Signed)
  Problem: Education: Goal: Knowledge of condition and prescribed therapy will improve Outcome: Adequate for Discharge   Problem: Cardiac: Goal: Will achieve and/or maintain adequate cardiac output Outcome: Adequate for Discharge   Problem: Physical Regulation: Goal: Complications related to the disease process, condition or treatment will be avoided or minimized Outcome: Adequate for Discharge   Problem: Acute Rehab OT Goals (only OT should resolve) Goal: Pt. Will Perform Grooming Outcome: Adequate for Discharge Goal: Pt. Will Perform Lower Body Bathing Outcome: Adequate for Discharge Goal: Pt. Will Perform Lower Body Dressing Outcome: Adequate for Discharge Goal: Pt. Will Perform Toileting-Clothing Manipulation Outcome: Adequate for Discharge Goal: Pt/Caregiver Will Perform Home Exercise Program Outcome: Adequate for Discharge   Problem: Acute Rehab OT Goals (only OT should resolve) Goal: Pt. Will Transfer To Toilet Outcome: Adequate for Discharge   Problem: Acute Rehab PT Goals(only PT should resolve) Goal: Pt Will Go Supine/Side To Sit Outcome: Adequate for Discharge Goal: Pt Will Go Sit To Supine/Side Outcome: Adequate for Discharge Goal: Patient Will Transfer Sit To/From Stand Outcome: Adequate for Discharge Goal: Pt Will Ambulate Outcome: Adequate for Discharge Goal: Pt Will Go Up/Down Stairs Outcome: Adequate for Discharge   Problem: Education: Goal: Knowledge of General Education information will improve Description: Including pain rating scale, medication(s)/side effects and non-pharmacologic comfort measures Outcome: Adequate for Discharge   Problem: Health Behavior/Discharge Planning: Goal: Ability to manage health-related needs will improve Outcome: Adequate for Discharge   Problem: Clinical Measurements: Goal: Ability to maintain clinical measurements within normal limits will improve Outcome: Adequate for Discharge Goal: Will remain free from  infection Outcome: Adequate for Discharge Goal: Diagnostic test results will improve Outcome: Adequate for Discharge Goal: Respiratory complications will improve Outcome: Adequate for Discharge Goal: Cardiovascular complication will be avoided Outcome: Adequate for Discharge   Problem: Activity: Goal: Risk for activity intolerance will decrease Outcome: Adequate for Discharge   Problem: Nutrition: Goal: Adequate nutrition will be maintained Outcome: Adequate for Discharge   Problem: Coping: Goal: Level of anxiety will decrease Outcome: Adequate for Discharge   Problem: Elimination: Goal: Will not experience complications related to bowel motility Outcome: Adequate for Discharge Goal: Will not experience complications related to urinary retention Outcome: Adequate for Discharge   Problem: Pain Management: Goal: General experience of comfort will improve Outcome: Adequate for Discharge   Problem: Safety: Goal: Ability to remain free from injury will improve Outcome: Adequate for Discharge   Problem: Skin Integrity: Goal: Risk for impaired skin integrity will decrease Outcome: Adequate for Discharge

## 2023-08-13 NOTE — Progress Notes (Signed)
Transition of Care [facility]) - CAGE-AID Screening   Patient Details  Name: Daniel Yu MRN: [id number] Date of Birth: 1932/06/23  [facility] CM/SW Contact:    Leota Sauers, RN Phone Number: 08/13/2023, 6:29 AM   Clinical Narrative:  Patient reports some current alcohol use. Denies use of illicit substances. Resources not given at this time.   CAGE-AID Screening:    Have You Ever Felt You Ought to Cut Down on Your Drinking or Drug Use?: No Have People Annoyed You By Critizing Your Drinking Or Drug Use?: No Have You Felt Bad Or Guilty About Your Drinking Or Drug Use?: No Have You Ever Had a Drink or Used Drugs First Thing In The Morning to Steady Your Nerves or to Get Rid of a Hangover?: No CAGE-AID Score: 0  Substance Abuse Education Offered: No

## 2023-08-13 NOTE — ED Notes (Signed)
ED TO INPATIENT HANDOFF REPORT  ED Nurse Name and Phone #: Merry Lofty [phone]  S Name/Age/Gender Daniel Yu 89 y.o. male Room/Bed: 045C/045C  Code Status   Code Status: Full Code  Home/SNF/Other Home Patient oriented to: self, place, time, and situation Is this baseline? Yes   Triage Complete: Triage complete  Chief Complaint Syncope and collapse [R55]  Triage Note Pt to the ed from home via ems  with a CC of fall. Pt is on blood thinners. Pt passed out while sitting at bar stool. Pt hit head right wrist, right knee. Pt has GCS of 15.    Allergies Allergies  Allergen Reactions   Atorvastatin Hives   Bee Venom Swelling and Rash   Ivp Dye [Iodinated Contrast Media] Rash    Level of Care/Admitting Diagnosis ED Disposition     ED Disposition  Admit   Condition  --   Comment  Hospital Area: [facility] [[id number]]  Level of Care: Telemetry Cardiac [103]  May place patient in observation at [facility] or [facility] if equivalent level of care is available:: No  Covid Evaluation: Asymptomatic - no recent exposure (last 10 days) testing not required  Diagnosis: Syncope and collapse [780.2.ICD-9-CM]  Admitting Physician: Charlsie Quest [[id number]]  Attending Physician: Charlsie Quest [[id number]]          B Medical/Surgery History Past Medical History:  Diagnosis Date   Arthritis    right knee oa   Asthma    Carpal tunnel syndrome    BILATERAL   Cholecystitis    Complication of anesthesia    adverse reaction to the spinal   Hypertension    PAF (paroxysmal atrial fibrillation) (HCC)    SBO (small bowel obstruction) (HCC) 12/2015   Past Surgical History:  Procedure Laterality Date   CARDIOVERSION N/A 02/14/2017   Procedure: CARDIOVERSION;  Surgeon: Wendall Stade, MD;  Location: [facility] ENDOSCOPY;  Service: Cardiovascular;  Laterality: N/A;   CARDIOVERSION N/A 12/11/2019   Procedure: CARDIOVERSION;  Surgeon: Lewayne Bunting, MD;  Location: [facility]  ENDOSCOPY;  Service: Cardiovascular;  Laterality: N/A;   CARDIOVERSION N/A 02/13/2022   Procedure: CARDIOVERSION;  Surgeon: Lewayne Bunting, MD;  Location: [facility] ENDOSCOPY;  Service: Cardiovascular;  Laterality: N/A;   CHOLECYSTECTOMY  02/09/2017   CHOLECYSTECTOMY N/A 02/09/2017   Procedure: LAPAROSCOPIC CHOLECYSTECTOMY WITH INTRAOPERATIVE CHOLANGIOGRAM;  Surgeon: Manus Rudd, MD;  Location: [facility] [place];  Service: General;  Laterality: N/A;   COLONOSCOPY WITH PROPOFOL N/A 11/09/2014   Procedure: COLONOSCOPY WITH PROPOFOL;  Surgeon: Charolett Bumpers, MD;  Location: [facility] ENDOSCOPY;  Service: Endoscopy;  Laterality: N/A;   HERNIA REPAIR     2013   LEFT HEART CATH AND CORONARY ANGIOGRAPHY N/A 05/23/2022   Procedure: LEFT HEART CATH AND CORONARY ANGIOGRAPHY;  Surgeon: Swaziland, Peter M, MD;  Location: [facility] INVASIVE CV LAB;  Service: Cardiovascular;  Laterality: N/A;   QUADRICEPS TENDON REPAIR Left 06/07/2015   Procedure: REPAIR LEFT QUADRICEP TENDON;  Surgeon: Ollen Gross, MD;  Location: [facility] ORS;  Service: Orthopedics;  Laterality: Left;   TEE WITHOUT CARDIOVERSION N/A 02/14/2017   Procedure: TRANSESOPHAGEAL ECHOCARDIOGRAM (TEE);  Surgeon: Wendall Stade, MD;  Location: [facility] ENDOSCOPY;  Service: Cardiovascular;  Laterality: N/A;   TOTAL KNEE ARTHROPLASTY Right 12/27/2015   Procedure: RIGHT TOTAL KNEE ARTHROPLASTY;  Surgeon: Ollen Gross, MD;  Location: [facility] ORS;  Service: Orthopedics;  Laterality: Right;     A IV Location/Drains/Wounds Patient Lines/Drains/Airways Status     Active Line/Drains/Airways  Name Placement date Placement time Site Days   Peripheral IV 08/12/23 20 G Anterior;Right Forearm 08/12/23  1519  Forearm  1   Airway 02/14/17  1203  -- 2371   Incision - 4 Ports Abdomen Umbilicus Right;Lateral;Lower Right;Lateral;Upper Medial;Upper 02/09/17  1606  -- 2376            Intake/Output Last 24 hours  Intake/Output Summary (Last 24 hours) at 08/13/2023 1420 Last data filed at 08/12/2023  1943 Gross per 24 hour  Intake 0 ml  Output --  Net 0 ml    Labs/Imaging Results for orders placed or performed during the hospital encounter of 08/12/23 (from the past 48 hours)  Comprehensive metabolic panel     Status: Abnormal   Collection Time: 08/12/23  2:57 PM  Result Value Ref Range   Sodium 139 135 - 145 mmol/L   Potassium 4.2 3.5 - 5.1 mmol/L   Chloride 107 98 - 111 mmol/L   CO2 17 (L) 22 - 32 mmol/L   Glucose, Bld 110 (H) 70 - 99 mg/dL    Comment: Glucose reference range applies only to samples taken after fasting for at least 8 hours.   BUN 27 (H) 8 - 23 mg/dL   Creatinine, Ser 6.21 (H) 0.61 - 1.24 mg/dL   Calcium 8.8 (L) 8.9 - 10.3 mg/dL   Total Protein 6.1 (L) 6.5 - 8.1 g/dL   Albumin 3.1 (L) 3.5 - 5.0 g/dL   AST 26 15 - 41 U/L   ALT 17 0 - 44 U/L   Alkaline Phosphatase 60 38 - 126 U/L   Total Bilirubin 0.9 <1.2 mg/dL   GFR, Estimated 51 (L) >60 mL/min    Comment: (NOTE) Calculated using the CKD-EPI Creatinine Equation (2021)    Anion gap 15 5 - 15    Comment: Performed at [facility], [address], [place], [place] [address]  CBC     Status: Abnormal   Collection Time: 08/12/23  2:57 PM  Result Value Ref Range   WBC 5.9 4.0 - 10.5 K/uL   RBC 3.65 (L) 4.22 - 5.81 MIL/uL   Hemoglobin 12.9 (L) 13.0 - 17.0 g/dL   HCT 78.4 69.6 - 29.5 %   MCV 108.2 (H) 80.0 - 100.0 fL   MCH 35.3 (H) 26.0 - 34.0 pg   MCHC 32.7 30.0 - 36.0 g/dL   RDW 28.4 13.2 - 44.0 %   Platelets 218 150 - 400 K/uL   nRBC 0.0 0.0 - 0.2 %    Comment: Performed at [facility], [address] [place], [place] [address]  Sample to Blood Bank     Status: None   Collection Time: 08/12/23  3:01 PM  Result Value Ref Range   Blood Bank Specimen SAMPLE AVAILABLE FOR TESTING    Sample Expiration      08/15/2023,2359 Performed at [facility], [address], [place], [place] [address]   I-Stat Chem 8, ED     Status: Abnormal   Collection Time: 08/12/23  3:04 PM  Result  Value Ref Range   Sodium 139 135 - 145 mmol/L   Potassium 4.0 3.5 - 5.1 mmol/L   Chloride 106 98 - 111 mmol/L   BUN 28 (H) 8 - 23 mg/dL   Creatinine, Ser 4.03 (H) 0.61 - 1.24 mg/dL   Glucose, Bld 474 (H) 70 - 99 mg/dL    Comment: Glucose reference range applies only to samples taken after fasting for at  least 8 hours.   Calcium, Ion 0.99 (L) 1.15 - 1.40 mmol/L   TCO2 21 (L) 22 - 32 mmol/L   Hemoglobin 12.2 (L) 13.0 - 17.0 g/dL   HCT 84.6 (L) 96.2 - 95.2 %  Urinalysis, Routine w reflex microscopic -Urine, Clean Catch     Status: Abnormal   Collection Time: 08/13/23  3:44 AM  Result Value Ref Range   Color, Urine YELLOW YELLOW   APPearance CLEAR CLEAR   Specific Gravity, Urine 1.020 1.005 - 1.030   pH 6.0 5.0 - 8.0   Glucose, UA >=500 (A) NEGATIVE mg/dL   Hgb urine dipstick NEGATIVE NEGATIVE   Bilirubin Urine NEGATIVE NEGATIVE   Ketones, ur NEGATIVE NEGATIVE mg/dL   Protein, ur NEGATIVE NEGATIVE mg/dL   Nitrite NEGATIVE NEGATIVE   Leukocytes,Ua NEGATIVE NEGATIVE   RBC / HPF 0-5 0 - 5 RBC/hpf   WBC, UA 0-5 0 - 5 WBC/hpf   Bacteria, UA NONE SEEN NONE SEEN   Squamous Epithelial / HPF 0-5 0 - 5 /HPF    Comment: Performed at [facility], [address], [place], [place] [address]  CBC     Status: Abnormal   Collection Time: 08/13/23  3:44 AM  Result Value Ref Range   WBC 8.3 4.0 - 10.5 K/uL   RBC 3.59 (L) 4.22 - 5.81 MIL/uL   Hemoglobin 12.2 (L) 13.0 - 17.0 g/dL   HCT 44.0 (L) 10.2 - 72.5 %   MCV 102.2 (H) 80.0 - 100.0 fL   MCH 34.0 26.0 - 34.0 pg   MCHC 33.2 30.0 - 36.0 g/dL   RDW 36.6 44.0 - 34.7 %   Platelets 215 150 - 400 K/uL   nRBC 0.0 0.0 - 0.2 %    Comment: Performed at [facility], [address] [place], [place] [address]  Basic metabolic panel     Status: Abnormal   Collection Time: 08/13/23  3:44 AM  Result Value Ref Range   Sodium 136 135 - 145 mmol/L   Potassium 4.1 3.5 - 5.1 mmol/L   Chloride 105 98 - 111 mmol/L   CO2 23 22 - 32 mmol/L    Glucose, Bld 97 70 - 99 mg/dL    Comment: Glucose reference range applies only to samples taken after fasting for at least 8 hours.   BUN 24 (H) 8 - 23 mg/dL   Creatinine, Ser 6.38 (H) 0.61 - 1.24 mg/dL   Calcium 8.4 (L) 8.9 - 10.3 mg/dL   GFR, Estimated 50 (L) >60 mL/min    Comment: (NOTE) Calculated using the CKD-EPI Creatinine Equation (2021)    Anion gap 8 5 - 15    Comment: Performed at [facility], [address], [place], [place] [address]  CBG monitoring, ED     Status: Abnormal   Collection Time: 08/13/23  5:29 AM  Result Value Ref Range   Glucose-Capillary 105 (H) 70 - 99 mg/dL    Comment: Glucose reference range applies only to samples taken after fasting for at least 8 hours.   CT HEAD WO CONTRAST Result Date: 08/12/2023 CLINICAL DATA:  Trauma, fall, syncope, on blood thinners EXAM: CT HEAD WITHOUT CONTRAST CT MAXILLOFACIAL WITHOUT CONTRAST CT CERVICAL SPINE WITHOUT CONTRAST TECHNIQUE: Multidetector CT imaging of the head, cervical spine, and maxillofacial structures were performed using the standard protocol without intravenous contrast. Multiplanar CT image reconstructions of the cervical spine and maxillofacial structures were also generated. RADIATION DOSE REDUCTION: This exam was performed  according to the departmental dose-optimization program which includes automated exposure control, adjustment of the mA and/or kV according to patient size and/or use of iterative reconstruction technique. COMPARISON:  07/09/2022 FINDINGS: CT HEAD FINDINGS Brain: No evidence of acute infarction, hemorrhage, hydrocephalus, extra-axial collection or mass lesion/mass effect. Periventricular white matter hypodensity. Vascular: No hyperdense vessel or unexpected calcification. CT FACIAL BONES FINDINGS Skull: Normal. Negative for fracture or focal lesion. Facial bones: Mildly displaced, angulated fractures of the nasal bones (series 4, image 70). Sinuses/Orbits: Chronic mucosal thickening of  the paranasal sinuses and ethmoid air cells with underlying bony sinus wall thickening. Other: Soft tissue contusion and laceration of the midline forehead (series 3, image 16). Soft tissue contusion of the nose. CT CERVICAL SPINE FINDINGS Alignment: Degenerative straightening of the normal cervical lordosis. Skull base and vertebrae: No acute fracture. No primary bone lesion or focal pathologic process. Soft tissues and spinal canal: No prevertebral fluid or swelling. No visible canal hematoma. Disc levels: Moderate to severe multilevel disc degenerative disease and osteophytosis, worst at C5-C6. Upper chest: Negative. Other: None. IMPRESSION: 1. No acute intracranial pathology. 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 2. Mildly displaced, angulated fractures of the nasal bones. 3. Soft tissue contusion and laceration of the midline forehead. Soft tissue contusion of the nose. 4. No fracture or static subluxation of the cervical spine. 5. Moderate to severe multilevel cervical disc degenerative disease. Electronically Signed   By: Jearld Lesch M.D.   On: 08/12/2023 15:48   CT MAXILLOFACIAL WO CONTRAST Result Date: 08/12/2023 CLINICAL DATA:  Trauma, fall, syncope, on blood thinners EXAM: CT HEAD WITHOUT CONTRAST CT MAXILLOFACIAL WITHOUT CONTRAST CT CERVICAL SPINE WITHOUT CONTRAST TECHNIQUE: Multidetector CT imaging of the head, cervical spine, and maxillofacial structures were performed using the standard protocol without intravenous contrast. Multiplanar CT image reconstructions of the cervical spine and maxillofacial structures were also generated. RADIATION DOSE REDUCTION: This exam was performed according to the departmental dose-optimization program which includes automated exposure control, adjustment of the mA and/or kV according to patient size and/or use of iterative reconstruction technique. COMPARISON:  07/09/2022 FINDINGS: CT HEAD FINDINGS Brain: No evidence of acute  infarction, hemorrhage, hydrocephalus, extra-axial collection or mass lesion/mass effect. Periventricular white matter hypodensity. Vascular: No hyperdense vessel or unexpected calcification. CT FACIAL BONES FINDINGS Skull: Normal. Negative for fracture or focal lesion. Facial bones: Mildly displaced, angulated fractures of the nasal bones (series 4, image 70). Sinuses/Orbits: Chronic mucosal thickening of the paranasal sinuses and ethmoid air cells with underlying bony sinus wall thickening. Other: Soft tissue contusion and laceration of the midline forehead (series 3, image 16). Soft tissue contusion of the nose. CT CERVICAL SPINE FINDINGS Alignment: Degenerative straightening of the normal cervical lordosis. Skull base and vertebrae: No acute fracture. No primary bone lesion or focal pathologic process. Soft tissues and spinal canal: No prevertebral fluid or swelling. No visible canal hematoma. Disc levels: Moderate to severe multilevel disc degenerative disease and osteophytosis, worst at C5-C6. Upper chest: Negative. Other: None. IMPRESSION: 1. No acute intracranial pathology. 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 2. Mildly displaced, angulated fractures of the nasal bones. 3. Soft tissue contusion and laceration of the midline forehead. Soft tissue contusion of the nose. 4. No fracture or static subluxation of the cervical spine. 5. Moderate to severe multilevel cervical disc degenerative disease. Electronically Signed   By: Jearld Lesch M.D.   On: 08/12/2023 15:48   CT CERVICAL SPINE WO CONTRAST Result Date: 08/12/2023  CLINICAL DATA:  Trauma, fall, syncope, on blood thinners EXAM: CT HEAD WITHOUT CONTRAST CT MAXILLOFACIAL WITHOUT CONTRAST CT CERVICAL SPINE WITHOUT CONTRAST TECHNIQUE: Multidetector CT imaging of the head, cervical spine, and maxillofacial structures were performed using the standard protocol without intravenous contrast. Multiplanar CT image  reconstructions of the cervical spine and maxillofacial structures were also generated. RADIATION DOSE REDUCTION: This exam was performed according to the departmental dose-optimization program which includes automated exposure control, adjustment of the mA and/or kV according to patient size and/or use of iterative reconstruction technique. COMPARISON:  07/09/2022 FINDINGS: CT HEAD FINDINGS Brain: No evidence of acute infarction, hemorrhage, hydrocephalus, extra-axial collection or mass lesion/mass effect. Periventricular white matter hypodensity. Vascular: No hyperdense vessel or unexpected calcification. CT FACIAL BONES FINDINGS Skull: Normal. Negative for fracture or focal lesion. Facial bones: Mildly displaced, angulated fractures of the nasal bones (series 4, image 70). Sinuses/Orbits: Chronic mucosal thickening of the paranasal sinuses and ethmoid air cells with underlying bony sinus wall thickening. Other: Soft tissue contusion and laceration of the midline forehead (series 3, image 16). Soft tissue contusion of the nose. CT CERVICAL SPINE FINDINGS Alignment: Degenerative straightening of the normal cervical lordosis. Skull base and vertebrae: No acute fracture. No primary bone lesion or focal pathologic process. Soft tissues and spinal canal: No prevertebral fluid or swelling. No visible canal hematoma. Disc levels: Moderate to severe multilevel disc degenerative disease and osteophytosis, worst at C5-C6. Upper chest: Negative. Other: None. IMPRESSION: 1. No acute intracranial pathology. 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 2. Mildly displaced, angulated fractures of the nasal bones. 3. Soft tissue contusion and laceration of the midline forehead. Soft tissue contusion of the nose. 4. No fracture or static subluxation of the cervical spine. 5. Moderate to severe multilevel cervical disc degenerative disease. Electronically Signed   By: Jearld Lesch M.D.   On: 08/12/2023  15:48   DG Knee Right Port Result Date: 08/12/2023 CLINICAL DATA:  Fall.  Right knee pain. EXAM: PORTABLE RIGHT KNEE - 1-2 VIEW COMPARISON:  None Available. FINDINGS: Mild diffuse decreased bone mineralization. 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 No acute fracture or dislocation. No definite joint effusion. IMPRESSION: 1. No acute findings. 2. Previous right total knee arthroplasty intact. Electronically Signed   By: Elberta Fortis M.D.   On: 08/12/2023 15:41   DG Knee Left Port Result Date: 08/12/2023 CLINICAL DATA:  Fall with left knee pain. EXAM: PORTABLE LEFT KNEE - 1-2 VIEW COMPARISON:  None Available. FINDINGS: Minimal osteoarthritic change most prominent over the patellofemoral joint. No acute fracture or dislocation. No definite joint effusion. Three small well corticated bony fragments in the suprapatellar region with the largest measuring approximately 1.9 cm as these may represent intra-articular loose bodies versus extra-articular nonspecific soft tissue calcifications. 1.7 cm well corticated bony fragment adjacent the inferior pole of the patella on the lateral view likely due to enthesopathy and less likely a chip fracture. IMPRESSION: 1. Minimal osteoarthritic change. 2. 1.7 cm well corticated bony fragment adjacent the inferior pole of the patella on the lateral view likely due to enthesopathy and less likely a chip fracture. Electronically Signed   By: Elberta Fortis M.D.   On: 08/12/2023 15:40   DG Wrist Complete Right Result Date: 08/12/2023 CLINICAL DATA:  Fall with right wrist pain. EXAM: RIGHT WRIST - COMPLETE 3+ VIEW COMPARISON:  None Available. FINDINGS: Mild diffuse decreased bone mineralization. Mild degenerate changes over the radiocarpal joint and radial side of  the carpal bones as well as first carpometacarpal joints. No acute fracture or dislocation. There is small vessel atherosclerotic disease present. IMPRESSION: 1. No acute  findings. 2. Mild degenerative changes. Electronically Signed   By: Elberta Fortis M.D.   On: 08/12/2023 15:35   DG Pelvis Portable Result Date: 08/12/2023 CLINICAL DATA:  Fall. EXAM: PORTABLE PELVIS 1-2 VIEWS COMPARISON:  KUB 01/07/2016, 01/04/2016 FINDINGS: There is diffuse decreased bone mineralization present. Mild symmetric degenerative changes of the hips. No acute fracture or dislocation. Degenerative change of the spine. IMPRESSION: 1. No acute findings. 2. Mild symmetric degenerative changes of the hips. Electronically Signed   By: Elberta Fortis M.D.   On: 08/12/2023 15:34   DG Chest Port 1 View Result Date: 08/12/2023 CLINICAL DATA:  Fall. EXAM: PORTABLE CHEST 1 VIEW COMPARISON:  07/09/2022 FINDINGS: Lungs are hypoinflated without focal airspace consolidation or effusion. No pneumothorax. Minimal prominence of the central perihilar markings likely due to the degree of hypoinflation. Cardiomediastinal silhouette and remainder of the exam is unchanged. No acute fracture. IMPRESSION: Hypoinflation without acute cardiopulmonary disease. Electronically Signed   By: Elberta Fortis M.D.   On: 08/12/2023 15:31    Pending Labs Unresulted Labs (From admission, onward)    None       Vitals/Pain Today's Vitals   08/13/23 0922 08/13/23 1100 08/13/23 1300 08/13/23 1340  BP: (!) 145/66 135/71 101/70 122/64  Pulse: 63 65 71 70  Resp: (!) 25 (!) 23 (!) 23 16  Temp: 97.8 F (36.6 C)   97.9 F (36.6 C)  TempSrc: Oral   Oral  SpO2: 96% 97% 96% 98%  Weight:      Height:      PainSc:        Isolation Precautions No active isolations  Medications Medications  sodium chloride flush (NS) 0.9 % injection 3 mL (3 mLs Intravenous Given 08/13/23 0953)  acetaminophen (TYLENOL) tablet 650 mg (650 mg Oral Given 08/13/23 0135)    Or  acetaminophen (TYLENOL) suppository 650 mg ( Rectal See Alternative 08/13/23 0135)  ondansetron (ZOFRAN) tablet 4 mg (has no administration in time range)    Or   ondansetron (ZOFRAN) injection 4 mg (has no administration in time range)  senna-docusate (Senokot-S) tablet 1 tablet (has no administration in time range)  amiodarone (PACERONE) tablet 200 mg (200 mg Oral Given 08/13/23 0942)  budesonide (PULMICORT) nebulizer solution 0.25 mg (0.25 mg Inhalation Given 08/13/23 0943)  rosuvastatin (CRESTOR) tablet 10 mg (10 mg Oral Given 08/13/23 0942)  Tafamidis CAPS 61 mg (61 mg Oral Not Given 08/13/23 1100)  perflutren lipid microspheres (DEFINITY) IV suspension (4 mLs Intravenous Given 08/13/23 1200)  Tdap (BOOSTRIX) injection 0.5 mL (0.5 mLs Intramuscular Given 08/12/23 1503)  lidocaine-EPINEPHrine (XYLOCAINE W/EPI) 2 %-1:200000 (PF) injection 20 mL (20 mLs Infiltration Given 08/12/23 1634)  lactated ringers bolus 1,000 mL (0 mLs Intravenous Stopped 08/12/23 1755)  acetaminophen (TYLENOL) tablet 1,000 mg (1,000 mg Oral Given 08/12/23 1752)    Mobility walks with person assist     Focused Assessments Cardiac Assessment Handoff:  Cardiac Rhythm: Normal sinus rhythm Lab Results  Component Value Date   TROPONINI 0.55 (HH) 02/10/2017   No results found for: "DDIMER" Does the Patient currently have chest pain? No   , Neuro Assessment Handoff:  Swallow screen pass?  NA Cardiac Rhythm: Normal sinus rhythm       Neuro Assessment: Within Defined Limits Neuro Checks:      Has TPA been given? NA If patient is a  Neuro Trauma and patient is going to OR before floor call report to 4N Charge nurse: [phone] or [phone]   R Recommendations: See Admitting Provider Note  Report given to:   Additional Notes:

## 2023-08-13 NOTE — Discharge Summary (Signed)
Physician Discharge Summary   Patient: Daniel Yu MRN: [id number] DOB: 1932-03-14  Admit date:     08/12/2023  Discharge date: 08/13/23  Discharge Physician: Jacquelin Hawking, MD   PCP: Emilio Aspen, MD   Recommendations at discharge:  PCP visit for hospital follow-up ENT visit for follow-up on nasal fractures Removal of five (5) sutures in one week on forehead Removal of seven (7) sutures in 10-14 days over right knee  Discharge Diagnoses: Principal Problem:   Syncope and collapse Active Problems:   Paroxysmal atrial fibrillation (HCC)   Chronic diastolic CHF (congestive heart failure) (HCC)   Cardiac amyloidosis (HCC)   Chronic kidney disease, stage 3a (HCC)   Essential hypertension   Nasal bones, closed fracture  Resolved Problems:   * No resolved hospital problems. *  Hospital Course: Daniel Yu is a 89 y.o. male with a history of paroxysmal atrial fibrillation, chronic diastolic heart failure, cardiac amyloidosis, nonobstructive CAD, bradycardia, CKD stage IIIa, hypertension, hyperlipidemia.  Patient presented secondary to syncopal episode with evidence of likely orthostatic hypotension as etiology.  Home antihypertensive regimen adjusted.  Patient suffered multiple lacerations and nasal fractures secondary to his fall.  Lacerations repaired by EDP while in the emergency department.  Recommendation for ENT follow-up for nasal fractures.  Assessment and Plan:  Syncope Etiology likely orthostatic hypotension in nature.  Patient with borderline low blood pressure on admission.  Patient monitored on telemetry without development of dangerous tachy- or bradyarrhythmia.  Home blood pressure medications were held.  Transthoracic echocardiogram noted to have mildly reduced LVEF.  Paroxysmal atrial fibrillation Rate controlled.  Patient is on amiodarone and Eliquis as an outpatient.  Patient is not on beta-blocker or calcium channel blocker secondary to history of  bradycardia.  Resume amiodarone and Eliquis on discharge.  Chronic heart failure with mildly reduced EF Transthoracic echocardiogram this admission significant for mildly reduced LVEF of 50 to 55%.  Continue Farxiga and spironolactone.  Patient to follow-up with outpatient cardiology.  CKD stage IIIa Stable.  Primary hypertension Patient with borderline hypotension this admission.  Discontinue losartan on discharge.  Hyperlipidemia Continue Crestor.  Forehead laceration Patient received five (5) sutures for repair by EDP on 12/22. Recommendation to remove sutures in 5-7 days.  Right knee laceration Patient received five (7) sutures for repair by EDP on 12/22. Recommendation to remove sutures in 10-14 days. Right knee immobilizer provided to improve knee laceration healing.  Mildly displaced angulated fractures of the nasal bones Noted on CT maxillofacial.   Consultants: None Procedures performed: Laceration repair x 2 Disposition: Home health Diet recommendation: Cardiac diet   DISCHARGE MEDICATION: Allergies as of 08/13/2023       Reactions   Atorvastatin Hives   Bee Venom Swelling, Rash   Ivp Dye [iodinated Contrast Media] Rash        Medication List     STOP taking these medications    losartan 50 MG tablet Commonly known as: COZAAR       TAKE these medications    amiodarone 200 MG tablet Commonly known as: PACERONE Take 1 tablet (200 mg total) by mouth daily.   Arnuity Ellipta 100 MCG/ACT Aepb Generic drug: Fluticasone Furoate Inhale 1 puff into the lungs in the morning.   cetirizine 10 MG tablet Commonly known as: ZYRTEC Take 10 mg by mouth at bedtime.   cyanocobalamin 1000 MCG tablet Commonly known as: VITAMIN B12 Take 1,000 mcg by mouth every other day.   Eliquis 5 MG Tabs tablet Generic  drug: apixaban TAKE 1 TABLET(5 MG) BY MOUTH TWICE DAILY   Farxiga 10 MG Tabs tablet Generic drug: dapagliflozin propanediol Take 1 tablet (10 mg  total) by mouth daily before breakfast.   furosemide 20 MG tablet Commonly known as: LASIX TAKE 2 TABLETS(40 MG) BY MOUTH DAILY   rosuvastatin 10 MG tablet Commonly known as: CRESTOR TAKE 1 TABLET(10 MG) BY MOUTH DAILY   spironolactone 25 MG tablet Commonly known as: ALDACTONE TAKE 1 TABLET(25 MG) BY MOUTH DAILY What changed: See the new instructions.   Vyndamax 61 MG Caps Generic drug: Tafamidis Take 1 capsule (61 mg total) by mouth daily.        Follow-up Information     [facility] In 1 week.   Contact information: [address] [place] [address] [phone]        Emilio Aspen, MD. Schedule an appointment as soon as possible for a visit in 1 week(s).   Specialty: Internal Medicine Why: For hospital follow-up Contact information: [address] [place] [address] [phone]                Discharge Exam: BP 122/64 (BP Location: Right Arm)   Pulse 70   Temp 97.9 F (36.6 C) (Oral)   Resp 16   Ht 5\' 10"  (1.778 m)   Wt 89 kg   SpO2 98%   BMI 28.15 kg/m   General exam: Appears calm and comfortable Respiratory system: Clear to auscultation. Respiratory effort normal. Cardiovascular system: S1 & S2 heard, RRR. No murmurs. Gastrointestinal system: Abdomen is nondistended, soft and nontender. Normal bowel sounds heard. Central nervous system: Alert and oriented. No focal neurological deficits. Musculoskeletal: No edema. No calf tenderness Skin: forehead laceration noted. Right knee laceration with 5 blue sutures identified; three sutures under skin flap Psychiatry: Judgement and insight appear normal. Mood & affect appropriate.    Condition at discharge: stable  The results of significant diagnostics from this hospitalization (including imaging, microbiology, ancillary and laboratory) are listed below for reference.   Imaging Studies: ECHOCARDIOGRAM COMPLETE Result Date:  08/13/2023    ECHOCARDIOGRAM REPORT   Patient Name:   Daniel Yu Date of Exam: 08/13/2023 Medical Rec #:  [id number]      Height:       70.0 in Accession #:    [id number]     Weight:       196.2 lb Date of Birth:  1931/10/19       BSA:          2.070 m Patient Age:    89 years       BP:           135/71 mmHg Patient Gender: M              HR:           68 bpm. Exam Location:  Inpatient Procedure: 2D Echo, Color Doppler, Cardiac Doppler and Intracardiac            Opacification Agent Indications:    R55 Syncope  History:        Patient has prior history of Echocardiogram examinations, most                 recent 06/05/2022. CHF, CAD, Arrythmias:Atrial Fibrillation;                 Risk Factors:Hypertension and Amyloidosis.  Sonographer:    Irving Burton Senior RDCS Referring Phys: [phone]  VISHAL R PATEL IMPRESSIONS  1. Left ventricular ejection fraction, by estimation, is 50 to 55%. The left ventricle has low normal function. The left ventricle has no regional wall motion abnormalities. There is mild concentric left ventricular hypertrophy. Left ventricular diastolic parameters are consistent with Grade II diastolic dysfunction (pseudonormalization).  2. Right ventricular systolic function is normal. The right ventricular size is normal. Tricuspid regurgitation signal is inadequate for assessing PA pressure.  3. Left atrial size was severely dilated.  4. Right atrial size was mildly dilated.  5. The mitral valve is normal in structure. Trivial mitral valve regurgitation.  6. The aortic valve is tricuspid. There is moderate thickening of the aortic valve. Aortic valve regurgitation is mild. Aortic valve sclerosis is present, with no evidence of aortic valve stenosis.  7. There is mild dilatation of the ascending aorta, measuring 39 mm.  8. The inferior vena cava is normal in size with greater than 50% respiratory variability, suggesting right atrial pressure of 3 mmHg. FINDINGS  Left Ventricle: Left ventricular ejection  fraction, by estimation, is 50 to 55%. The left ventricle has low normal function. The left ventricle has no regional wall motion abnormalities. Definity contrast agent was given IV to delineate the left ventricular endocardial borders. The left ventricular internal cavity size was normal in size. There is mild concentric left ventricular hypertrophy. Left ventricular diastolic parameters are consistent with Grade II diastolic dysfunction (pseudonormalization). Right Ventricle: The right ventricular size is normal. No increase in right ventricular wall thickness. Right ventricular systolic function is normal. Tricuspid regurgitation signal is inadequate for assessing PA pressure. Left Atrium: Left atrial size was severely dilated. Right Atrium: Right atrial size was mildly dilated. Pericardium: Trivial pericardial effusion is present. Mitral Valve: The mitral valve is normal in structure. Trivial mitral valve regurgitation. Tricuspid Valve: The tricuspid valve is normal in structure. Tricuspid valve regurgitation is trivial. Aortic Valve: The aortic valve is tricuspid. There is moderate thickening of the aortic valve. Aortic valve regurgitation is mild. Aortic valve sclerosis is present, with no evidence of aortic valve stenosis. Pulmonic Valve: The pulmonic valve was normal in structure. Pulmonic valve regurgitation is mild. Aorta: The aortic root is normal in size and structure. There is mild dilatation of the ascending aorta, measuring 39 mm. Venous: The inferior vena cava is normal in size with greater than 50% respiratory variability, suggesting right atrial pressure of 3 mmHg. IAS/Shunts: The atrial septum is grossly normal.  LEFT VENTRICLE PLAX 2D LVIDd:         4.40 cm   Diastology LVIDs:         3.30 cm   LV e' medial:    4.57 cm/s LV PW:         1.30 cm   LV E/e' medial:  19.1 LV IVS:        1.20 cm   LV e' lateral:   8.16 cm/s LVOT diam:     2.20 cm   LV E/e' lateral: 10.7 LV SV:         84 LV SV Index:    40 LVOT Area:     3.80 cm  RIGHT VENTRICLE RV S prime:     17.00 cm/s TAPSE (M-mode): 2.5 cm LEFT ATRIUM              Index        RIGHT ATRIUM           Index LA diam:        4.50  cm  2.17 cm/m   RA Area:     26.70 cm LA Vol (A2C):   102.0 ml 49.27 ml/m  RA Volume:   78.90 ml  38.11 ml/m LA Vol (A4C):   98.3 ml  47.48 ml/m LA Biplane Vol: 105.0 ml 50.71 ml/m  AORTIC VALVE LVOT Vmax:   99.90 cm/s LVOT Vmean:  72.300 cm/s LVOT VTI:    0.220 m  AORTA Ao Root diam: 3.30 cm Ao Asc diam:  3.90 cm MITRAL VALVE MV Area (PHT): 2.72 cm    SHUNTS MV Decel Time: 279 msec    Systemic VTI:  0.22 m MV E velocity: 87.40 cm/s  Systemic Diam: 2.20 cm MV A velocity: 51.80 cm/s MV E/A ratio:  1.69 Clearnce Hasten Electronically signed by Clearnce Hasten Signature Date/Time: 08/13/2023/2:29:47 PM    Final    CT HEAD WO CONTRAST Result Date: 08/12/2023 CLINICAL DATA:  Trauma, fall, syncope, on blood thinners EXAM: CT HEAD WITHOUT CONTRAST CT MAXILLOFACIAL WITHOUT CONTRAST CT CERVICAL SPINE WITHOUT CONTRAST TECHNIQUE: Multidetector CT imaging of the head, cervical spine, and maxillofacial structures were performed using the standard protocol without intravenous contrast. Multiplanar CT image reconstructions of the cervical spine and maxillofacial structures were also generated. RADIATION DOSE REDUCTION: This exam was performed according to the departmental dose-optimization program which includes automated exposure control, adjustment of the mA and/or kV according to patient size and/or use of iterative reconstruction technique. COMPARISON:  07/09/2022 FINDINGS: CT HEAD FINDINGS Brain: No evidence of acute infarction, hemorrhage, hydrocephalus, extra-axial collection or mass lesion/mass effect. Periventricular white matter hypodensity. Vascular: No hyperdense vessel or unexpected calcification. CT FACIAL BONES FINDINGS Skull: Normal. Negative for fracture or focal lesion. Facial bones: Mildly displaced, angulated fractures of  the nasal bones (series 4, image 70). Sinuses/Orbits: Chronic mucosal thickening of the paranasal sinuses and ethmoid air cells with underlying bony sinus wall thickening. Other: Soft tissue contusion and laceration of the midline forehead (series 3, image 16). Soft tissue contusion of the nose. CT CERVICAL SPINE FINDINGS Alignment: Degenerative straightening of the normal cervical lordosis. Skull base and vertebrae: No acute fracture. No primary bone lesion or focal pathologic process. Soft tissues and spinal canal: No prevertebral fluid or swelling. No visible canal hematoma. Disc levels: Moderate to severe multilevel disc degenerative disease and osteophytosis, worst at C5-C6. Upper chest: Negative. Other: None. IMPRESSION: 1. No acute intracranial pathology. 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 2. Mildly displaced, angulated fractures of the nasal bones. 3. Soft tissue contusion and laceration of the midline forehead. Soft tissue contusion of the nose. 4. No fracture or static subluxation of the cervical spine. 5. Moderate to severe multilevel cervical disc degenerative disease. Electronically Signed   By: Jearld Lesch M.D.   On: 08/12/2023 15:48   CT MAXILLOFACIAL WO CONTRAST Result Date: 08/12/2023 CLINICAL DATA:  Trauma, fall, syncope, on blood thinners EXAM: CT HEAD WITHOUT CONTRAST CT MAXILLOFACIAL WITHOUT CONTRAST CT CERVICAL SPINE WITHOUT CONTRAST TECHNIQUE: Multidetector CT imaging of the head, cervical spine, and maxillofacial structures were performed using the standard protocol without intravenous contrast. Multiplanar CT image reconstructions of the cervical spine and maxillofacial structures were also generated. RADIATION DOSE REDUCTION: This exam was performed according to the departmental dose-optimization program which includes automated exposure control, adjustment of the mA and/or kV according to patient size and/or use of iterative reconstruction  technique. COMPARISON:  07/09/2022 FINDINGS: CT HEAD FINDINGS Brain: No evidence of acute infarction, hemorrhage, hydrocephalus, extra-axial collection or mass lesion/mass effect. Periventricular  white matter hypodensity. Vascular: No hyperdense vessel or unexpected calcification. CT FACIAL BONES FINDINGS Skull: Normal. Negative for fracture or focal lesion. Facial bones: Mildly displaced, angulated fractures of the nasal bones (series 4, image 70). Sinuses/Orbits: Chronic mucosal thickening of the paranasal sinuses and ethmoid air cells with underlying bony sinus wall thickening. Other: Soft tissue contusion and laceration of the midline forehead (series 3, image 16). Soft tissue contusion of the nose. CT CERVICAL SPINE FINDINGS Alignment: Degenerative straightening of the normal cervical lordosis. Skull base and vertebrae: No acute fracture. No primary bone lesion or focal pathologic process. Soft tissues and spinal canal: No prevertebral fluid or swelling. No visible canal hematoma. Disc levels: Moderate to severe multilevel disc degenerative disease and osteophytosis, worst at C5-C6. Upper chest: Negative. Other: None. IMPRESSION: 1. No acute intracranial pathology. 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 2. Mildly displaced, angulated fractures of the nasal bones. 3. Soft tissue contusion and laceration of the midline forehead. Soft tissue contusion of the nose. 4. No fracture or static subluxation of the cervical spine. 5. Moderate to severe multilevel cervical disc degenerative disease. Electronically Signed   By: Jearld Lesch M.D.   On: 08/12/2023 15:48   CT CERVICAL SPINE WO CONTRAST Result Date: 08/12/2023 CLINICAL DATA:  Trauma, fall, syncope, on blood thinners EXAM: CT HEAD WITHOUT CONTRAST CT MAXILLOFACIAL WITHOUT CONTRAST CT CERVICAL SPINE WITHOUT CONTRAST TECHNIQUE: Multidetector CT imaging of the head, cervical spine, and maxillofacial structures were performed  using the standard protocol without intravenous contrast. Multiplanar CT image reconstructions of the cervical spine and maxillofacial structures were also generated. RADIATION DOSE REDUCTION: This exam was performed according to the departmental dose-optimization program which includes automated exposure control, adjustment of the mA and/or kV according to patient size and/or use of iterative reconstruction technique. COMPARISON:  07/09/2022 FINDINGS: CT HEAD FINDINGS Brain: No evidence of acute infarction, hemorrhage, hydrocephalus, extra-axial collection or mass lesion/mass effect. Periventricular white matter hypodensity. Vascular: No hyperdense vessel or unexpected calcification. CT FACIAL BONES FINDINGS Skull: Normal. Negative for fracture or focal lesion. Facial bones: Mildly displaced, angulated fractures of the nasal bones (series 4, image 70). Sinuses/Orbits: Chronic mucosal thickening of the paranasal sinuses and ethmoid air cells with underlying bony sinus wall thickening. Other: Soft tissue contusion and laceration of the midline forehead (series 3, image 16). Soft tissue contusion of the nose. CT CERVICAL SPINE FINDINGS Alignment: Degenerative straightening of the normal cervical lordosis. Skull base and vertebrae: No acute fracture. No primary bone lesion or focal pathologic process. Soft tissues and spinal canal: No prevertebral fluid or swelling. No visible canal hematoma. Disc levels: Moderate to severe multilevel disc degenerative disease and osteophytosis, worst at C5-C6. Upper chest: Negative. Other: None. IMPRESSION: 1. No acute intracranial pathology. 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 2. Mildly displaced, angulated fractures of the nasal bones. 3. Soft tissue contusion and laceration of the midline forehead. Soft tissue contusion of the nose. 4. No fracture or static subluxation of the cervical spine. 5. Moderate to severe multilevel cervical disc  degenerative disease. Electronically Signed   By: Jearld Lesch M.D.   On: 08/12/2023 15:48   DG Knee Right Port Result Date: 08/12/2023 CLINICAL DATA:  Fall.  Right knee pain. EXAM: PORTABLE RIGHT KNEE - 1-2 VIEW COMPARISON:  None Available. FINDINGS: Mild diffuse decreased bone mineralization. 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 No acute fracture or dislocation. No definite joint effusion. IMPRESSION: 1. No acute  findings. 2. Previous right total knee arthroplasty intact. Electronically Signed   By: Elberta Fortis M.D.   On: 08/12/2023 15:41   DG Knee Left Port Result Date: 08/12/2023 CLINICAL DATA:  Fall with left knee pain. EXAM: PORTABLE LEFT KNEE - 1-2 VIEW COMPARISON:  None Available. FINDINGS: Minimal osteoarthritic change most prominent over the patellofemoral joint. No acute fracture or dislocation. No definite joint effusion. Three small well corticated bony fragments in the suprapatellar region with the largest measuring approximately 1.9 cm as these may represent intra-articular loose bodies versus extra-articular nonspecific soft tissue calcifications. 1.7 cm well corticated bony fragment adjacent the inferior pole of the patella on the lateral view likely due to enthesopathy and less likely a chip fracture. IMPRESSION: 1. Minimal osteoarthritic change. 2. 1.7 cm well corticated bony fragment adjacent the inferior pole of the patella on the lateral view likely due to enthesopathy and less likely a chip fracture. Electronically Signed   By: Elberta Fortis M.D.   On: 08/12/2023 15:40   DG Wrist Complete Right Result Date: 08/12/2023 CLINICAL DATA:  Fall with right wrist pain. EXAM: RIGHT WRIST - COMPLETE 3+ VIEW COMPARISON:  None Available. FINDINGS: Mild diffuse decreased bone mineralization. Mild degenerate changes over the radiocarpal joint and radial side of the carpal bones as well as first carpometacarpal joints. No acute fracture or  dislocation. There is small vessel atherosclerotic disease present. IMPRESSION: 1. No acute findings. 2. Mild degenerative changes. Electronically Signed   By: Elberta Fortis M.D.   On: 08/12/2023 15:35   DG Pelvis Portable Result Date: 08/12/2023 CLINICAL DATA:  Fall. EXAM: PORTABLE PELVIS 1-2 VIEWS COMPARISON:  [facility] 01/07/2016, 01/04/2016 FINDINGS: There is diffuse decreased bone mineralization present. Mild symmetric degenerative changes of the hips. No acute fracture or dislocation. Degenerative change of the spine. IMPRESSION: 1. No acute findings. 2. Mild symmetric degenerative changes of the hips. Electronically Signed   By: Elberta Fortis M.D.   On: 08/12/2023 15:34   DG Chest Port 1 View Result Date: 08/12/2023 CLINICAL DATA:  Fall. EXAM: PORTABLE CHEST 1 VIEW COMPARISON:  07/09/2022 FINDINGS: Lungs are hypoinflated without focal airspace consolidation or effusion. No pneumothorax. Minimal prominence of the central perihilar markings likely due to the degree of hypoinflation. Cardiomediastinal silhouette and remainder of the exam is unchanged. No acute fracture. IMPRESSION: Hypoinflation without acute cardiopulmonary disease. Electronically Signed   By: Elberta Fortis M.D.   On: 08/12/2023 15:31    Microbiology: Results for orders placed or performed during the hospital encounter of 06/04/22  Resp Panel by RT-PCR (Flu A&B, Covid) Anterior Nasal Swab     Status: None   Collection Time: 06/04/22 12:12 PM   Specimen: Anterior Nasal Swab  Result Value Ref Range Status   SARS Coronavirus 2 by RT PCR NEGATIVE NEGATIVE Final    Comment: (NOTE) SARS-CoV-2 target nucleic acids are NOT DETECTED.  The SARS-CoV-2 RNA is generally detectable in upper respiratory specimens during the acute phase of infection. The lowest concentration of SARS-CoV-2 viral copies this assay can detect is 138 copies/mL. A negative result does not preclude SARS-Cov-2 infection and should not be used as the sole basis for  treatment or other patient management decisions. A negative result may occur with  improper specimen collection/handling, submission of specimen other than nasopharyngeal swab, presence of viral mutation(s) within the areas targeted by this assay, and inadequate number of viral copies(<138 copies/mL). A negative result must be combined with clinical observations, patient history, and epidemiological information. The expected result  is Negative.  Fact Sheet for Patients:  BloggerCourse.com  Fact Sheet for Healthcare Providers:  SeriousBroker.it  This test is no t yet approved or cleared by the [place] FDA and  has been authorized for detection and/or diagnosis of SARS-CoV-2 by FDA under an Emergency Use Authorization (EUA). This EUA will remain  in effect (meaning this test can be used) for the duration of the COVID-19 declaration under Section 564(b)(1) of the Act, 21 U.S.C.section 360bbb-3(b)(1), unless the authorization is terminated  or revoked sooner.       Influenza A by PCR NEGATIVE NEGATIVE Final   Influenza B by PCR NEGATIVE NEGATIVE Final    Comment: (NOTE) The Xpert Xpress SARS-CoV-2/FLU/RSV plus assay is intended as an aid in the diagnosis of influenza from Nasopharyngeal swab specimens and should not be used as a sole basis for treatment. Nasal washings and aspirates are unacceptable for Xpert Xpress SARS-CoV-2/FLU/RSV testing.  Fact Sheet for Patients: BloggerCourse.com  Fact Sheet for Healthcare Providers: SeriousBroker.it  This test is not yet approved or cleared by the [place] FDA and has been authorized for detection and/or diagnosis of SARS-CoV-2 by FDA under an Emergency Use Authorization (EUA). This EUA will remain in effect (meaning this test can be used) for the duration of the COVID-19 declaration under Section 564(b)(1) of the Act, 21  U.S.C. section 360bbb-3(b)(1), unless the authorization is terminated or revoked.  Performed at [facility], [address], [place], [place] [address]   MRSA Next Gen by PCR, Nasal     Status: None   Collection Time: 06/05/22  9:23 PM   Specimen: Nasal Mucosa; Nasal Swab  Result Value Ref Range Status   MRSA by PCR Next Gen NOT DETECTED NOT DETECTED Final    Comment: (NOTE) The GeneXpert MRSA Assay (FDA approved for NASAL specimens only), is one component of a comprehensive MRSA colonization surveillance program. It is not intended to diagnose MRSA infection nor to guide or monitor treatment for MRSA infections. Test performance is not FDA approved in patients less than 30 years old. Performed at [facility], [address], [place], [place] [address]     Labs: CBC: Recent Labs  Lab 08/12/23 1457 08/12/23 1504 08/13/23 0344  WBC 5.9  --  8.3  HGB 12.9* 12.2* 12.2*  HCT 39.5 36.0* 36.7*  MCV 108.2*  --  102.2*  PLT 218  --  215   Basic Metabolic Panel: Recent Labs  Lab 08/12/23 1457 08/12/23 1504 08/13/23 0344  NA 139 139 136  K 4.2 4.0 4.1  CL 107 106 105  CO2 17*  --  23  GLUCOSE 110* 107* 97  BUN 27* 28* 24*  CREATININE 1.33* 1.40* 1.36*  CALCIUM 8.8*  --  8.4*   Liver Function Tests: Recent Labs  Lab 08/12/23 1457  AST 26  ALT 17  ALKPHOS 60  BILITOT 0.9  PROT 6.1*  ALBUMIN 3.1*   CBG: Recent Labs  Lab 08/13/23 0529  GLUCAP 105*    Discharge time spent: 35 minutes.  Signed: Jacquelin Hawking, MD [facility] 08/13/2023

## 2023-08-13 NOTE — Care Management (Signed)
ED RNCM consulted concerning HH recommendations. Patient's PT eval noted patient would benefit from HHPT/OT.  ED CM attempted contact spouse however, patient was transported upstairs to [facility].   3E RNCM spoke with spouse concerning choice, she reported no HH agency preference.  Contacted Angie liaison with [facility] who has accepted the referral. Information has been placed on AVS.

## 2023-08-13 NOTE — Progress Notes (Signed)
On arrival to the unit, received call from [facility], MD that pt is to be discharged.

## 2023-08-13 NOTE — Evaluation (Signed)
Physical Therapy Evaluation Patient Details Name: Daniel Yu MRN: [id number] DOB: 04-19-1932 Today's Date: 08/13/2023  History of Present Illness  Pt is 89 yo male who presents to the ED on 08/12/23 with syncope with fall to floor. Abrasions to forehead and knees.  PMH:  PAF on Eliquis, chronic diastolic CHF, cardiac amyloidosis, nonobstructive CAD, history of bradycardia, CKD stage IIIa, HTN, HLD, [name]  Clinical Impression  Pt admitted with above diagnosis. Pt from home with wife, daughter and her family currently visiting for the holidays as well. Family plans to move a twin bed downstairs for pt so he does not have to go up flight to bedroom. Pt unsteady with ambulation with R KI, recommend RW for home. Pt mobilizing at min A level to get OOB and to ambulate. Recommend HHPT at d/c with transition to outpt at [facility] where they are members. Pt vitals stable throughout session, see in comments below.  Pt currently with functional limitations due to the deficits listed below (see PT Problem List). Pt will benefit from acute skilled PT to increase their independence and safety with mobility to allow discharge.           If plan is discharge home, recommend the following: A little help with walking and/or transfers;A little help with bathing/dressing/bathroom;Assistance with cooking/housework;Assist for transportation;Help with stairs or ramp for entrance   Can travel by private vehicle        Equipment Recommendations Rolling walker (2 wheels)  Recommendations for Other Services       Functional Status Assessment Patient has had a recent decline in their functional status and demonstrates the ability to make significant improvements in function in a reasonable and predictable amount of time.     Precautions / Restrictions Precautions Precautions: Fall Required Braces or Orthoses: Knee Immobilizer - Right Knee Immobilizer - Right: Other (comment) (on at all times to start secondary  to knee wound, 5-7 days) Restrictions Weight Bearing Restrictions Per Provider Order: No      Mobility  Bed Mobility Overal bed mobility: Needs Assistance Bed Mobility: Supine to Sit, Sit to Supine     Supine to sit: Min assist Sit to supine: Min assist   General bed mobility comments: Min assist to move RLE off bed and bring trunk up to sitting.    Transfers Overall transfer level: Needs assistance Equipment used: Rollator (4 wheels) Transfers: Sit to/from Stand, Bed to chair/wheelchair/BSC Sit to Stand: Min assist   Step pivot transfers: Min assist       General transfer comment: Min assist for sit to stand from the EOB with RLE KI in place. Took pivot steps to [facility], pt unsteady with KI    Ambulation/Gait Ambulation/Gait assistance: Min Chemical engineer (Feet): 50 Feet Assistive device: Rollator (4 wheels) Gait Pattern/deviations: Step-to pattern Gait velocity: decreased Gait velocity interpretation: 1.31 - 2.62 ft/sec, indicative of limited community ambulator   General Gait Details: altered gait due to KI, pt ambulates with fwd flexed posture. Recommend RW for home as he is off balance with no AD  Stairs            Wheelchair Mobility     Tilt Bed    Modified Rankin (Stroke Patients Only)       Balance Overall balance assessment: Needs assistance Sitting-balance support: Feet supported Sitting balance-Leahy Scale: Fair     Standing balance support: During functional activity, No upper extremity supported Standing balance-Leahy Scale: Fair  Pertinent Vitals/Pain Pain Assessment Pain Assessment: Faces Faces Pain Scale: Hurts a little bit Pain Location: right wrist Pain Descriptors / Indicators: Discomfort Pain Intervention(s): Limited activity within patient's tolerance, Monitored during session    Home Living Family/patient expects to be discharged to:: Private residence Living Arrangements:  Spouse/significant other Available Help at Discharge: Family;Available 24 hours/day Type of Home: House Home Access: Stairs to enter Entrance Stairs-Rails: None (posts) Secretary/administrator of Steps: 3-4 Alternate Level Stairs-Number of Steps: 12 Home Layout: Two level;1/2 bath on main level Home Equipment: Grab bars - tub/shower Additional Comments: family to bring twin bed down so pt can stay downstairs    Prior Function Prior Level of Function : Independent/Modified Independent             Mobility Comments: ambulated without AD       Extremity/Trunk Assessment   Upper Extremity Assessment Upper Extremity Assessment: Defer to OT evaluation RUE Deficits / Details: Pt with right and left hand abrasions and skin tears affecting digit flexion more on the right compared to the left.  Soreness in the left and right wrist as well.  All other joints AROM and strength grossly WFLs.  Exhibits 75% of full digit flexion on the right. RUE Sensation: WNL RUE Coordination: decreased fine motor LUE Deficits / Details: Pt with  left hand abrasions and wrist skin tears affecting digit flexion more on the right compared to the left. Soreness in the left and right wrist as well. All other joints AROM and strength grossly WFLs. Exhibits 75% of full digit flexion 45% on the left. LUE Sensation: WNL LUE Coordination: decreased fine motor    Lower Extremity Assessment Lower Extremity Assessment: Generalized weakness;RLE deficits/detail;LLE deficits/detail RLE Deficits / Details: R knee KI, unable to lift RLE against gravity with KI on. Taught pt to use LLE to assist. RLE: Unable to fully assess due to immobilization RLE Sensation: WNL RLE Coordination: decreased gross motor LLE Deficits / Details: WFL but has laceration L knee as well, no stitches LLE Sensation: WNL LLE Coordination: WNL    Cervical / Trunk Assessment Cervical / Trunk Assessment: Normal  Communication    Communication Communication: No apparent difficulties  Cognition Arousal: Alert Behavior During Therapy: WFL for tasks assessed/performed Overall Cognitive Status: Within Functional Limits for tasks assessed                                          General Comments General comments (skin integrity, edema, etc.): BP 135/71, HR 65 bpm. Pt unable to perform self care after BM on BSC due to R hand pain.    Exercises General Exercises - Lower Extremity Ankle Circles/Pumps: AROM, Both, 20 reps, Supine   Assessment/Plan    PT Assessment Patient needs continued PT services  PT Problem List Decreased strength;Decreased activity tolerance;Decreased range of motion;Decreased balance;Decreased mobility;Decreased coordination;Decreased knowledge of use of DME;Decreased knowledge of precautions;Decreased skin integrity;Pain       PT Treatment Interventions DME instruction;Gait training;Stair training;Functional mobility training;Therapeutic activities;Therapeutic exercise;Balance training;Neuromuscular re-education;Patient/family education    PT Goals (Current goals can be found in the Care Plan section)  Acute Rehab PT Goals Patient Stated Goal: return home PT Goal Formulation: With patient/family Time For Goal Achievement: 08/27/23 Potential to Achieve Goals: Good    Frequency Min 1X/week     Co-evaluation  AM-PAC PT "6 Clicks" Mobility  Outcome Measure Help needed turning from your back to your side while in a flat bed without using bedrails?: A Little Help needed moving from lying on your back to sitting on the side of a flat bed without using bedrails?: A Little Help needed moving to and from a bed to a chair (including a wheelchair)?: A Little Help needed standing up from a chair using your arms (e.g., wheelchair or bedside chair)?: A Little Help needed to walk in hospital room?: A Little Help needed climbing 3-5 steps with a railing? : A Lot 6  Click Score: 17    End of Session Equipment Utilized During Treatment: Gait belt;Right knee immobilizer Activity Tolerance: Patient tolerated treatment well Patient left: in bed;with call bell/phone within reach;with chair alarm set;with family/visitor present (chair alarm pad on bed) Nurse Communication: Mobility status PT Visit Diagnosis: Unsteadiness on feet (R26.81);Muscle weakness (generalized) (M62.81);Difficulty in walking, not elsewhere classified (R26.2);Pain;Repeated falls (R29.6) Pain - Right/Left: Right Pain - part of body: Hand;Knee    Time: 1022-1105 PT Time Calculation (min) (ACUTE ONLY): 43 min   Charges:   PT Evaluation $PT Eval Moderate Complexity: 1 Mod PT Treatments $Gait Training: 8-22 mins $Therapeutic Activity: 8-22 mins PT General Charges $$ ACUTE PT VISIT: 1 Visit         Lyanne Co, PT  Acute Rehab Services Secure chat preferred Office [phone]   Lawana Chambers Jerelene Salaam 08/13/2023, 12:45 PM

## 2023-08-13 NOTE — Evaluation (Addendum)
Occupational Therapy Evaluation Patient Details Name: DANY DELAO MRN: [id number] DOB: 1932-01-02 Today's Date: 08/13/2023   History of Present Illness Pt is 89 yo male who presents to the ED on 08/12/23 with syncope with fall to floor. Abrasions to forehead and knees.  PMH:  PAF on Eliquis, chronic diastolic CHF, cardiac amyloidosis, nonobstructive CAD, history of bradycardia, CKD stage IIIa, HTN, HLD, [name]   Clinical Impression   Pt currently at overall min assist level for sit to stand and LB selfcare tasks with RLE KI in place.  BP in supine with HOB elevated around 30 degrees 139/73, in sitting 106/62, and in standing 118/71.  No reports of dizziness.  Pt with general soreness with pain in the left and right wrist and hands secondary to skin tears and abrasions.  Prior to admission he was independent living with his spouse who can provide 24 hour assist as needed.  Feel he will benefit from acute care OT at this time in order to progress ADL independence back to a supervision level in order to reduce burden of care.  Anticipate follow-up HHOT post acute and 3:1 for toileting as long as pt needs use of the KI.       If plan is discharge home, recommend the following: A little help with walking and/or transfers;Assistance with cooking/housework;Assist for transportation;A lot of help with bathing/dressing/bathroom    Functional Status Assessment  Patient has had a recent decline in their functional status and demonstrates the ability to make significant improvements in function in a reasonable and predictable amount of time.  Equipment Recommendations  BSC/3in1       Precautions / Restrictions Precautions Precautions: Fall Required Braces or Orthoses: Knee Immobilizer - Right Knee Immobilizer - Right: Other (comment) (on at all times to start secondary to knee wound) Restrictions Weight Bearing Restrictions Per Provider Order: No      Mobility Bed Mobility Overal bed mobility:  Needs Assistance Bed Mobility: Supine to Sit     Supine to sit: Min assist     General bed mobility comments: Min assist with bringing trunk up to sitting    Transfers Overall transfer level: Needs assistance Equipment used: None Transfers: Sit to/from Stand, Bed to chair/wheelchair/BSC Sit to Stand: Min assist     Step pivot transfers: Min assist     General transfer comment: Min assist for sit to stand from the EOB with RLE KI in place.      Balance Overall balance assessment: Needs assistance Sitting-balance support: Feet supported Sitting balance-Leahy Scale: Fair     Standing balance support: During functional activity, No upper extremity supported Standing balance-Leahy Scale: Fair                             ADL either performed or assessed with clinical judgement   ADL Overall ADL's : Needs assistance/impaired Eating/Feeding: Set up;Sitting   Grooming: Wash/dry face;Set up;Sitting       Lower Body Bathing: Minimal assistance;Sit to/from stand   Upper Body Dressing : Supervision/safety;Sitting   Lower Body Dressing: Moderate assistance;Sit to/from stand   Toilet Transfer: Minimal assistance;Ambulation Toilet Transfer Details (indicate cue type and reason): simulated without assistive device Toileting- Clothing Manipulation and Hygiene: Minimal assistance;Sit to/from stand       Functional mobility during ADLs: Minimal assistance General ADL Comments: Pt currently at min assist for sit to stand from the EOB.  Discussed benefit of a 3:1 for home to  assist with standing from lower toilet.  Also discussed possible shower seat, but agreed to hold off for now as he is unable to shower at this time secondary to wounds.  Decreased ability to reach his RLE for donning and doffing gripper socks.  Pt reports his spouse will assist as needed.     Vision Baseline Vision/History: 0 No visual deficits Ability to See in Adequate Light: 0  Adequate Patient Visual Report: No change from baseline Vision Assessment?: No apparent visual deficits     Perception Perception: Not tested       Praxis Praxis: Not tested       Pertinent Vitals/Pain Pain Assessment Pain Assessment: Faces Faces Pain Scale: Hurts a little bit Pain Location: right wrist Pain Descriptors / Indicators: Discomfort Pain Intervention(s): Repositioned, Monitored during session     Extremity/Trunk Assessment Upper Extremity Assessment Upper Extremity Assessment: LUE deficits/detail;RUE deficits/detail RUE Deficits / Details: Pt with right and left hand abrasions and skin tears affecting digit flexion more on the right compared to the left.  Soreness in the left and right wrist as well.  All other joints AROM and strength grossly WFLs.  Exhibits 75% of full digit flexion on the right. RUE Sensation: WNL RUE Coordination: decreased fine motor LUE Deficits / Details: Pt with  left hand abrasions and wrist skin tears affecting digit flexion more on the right compared to the left. Soreness in the left and right wrist as well. All other joints AROM and strength grossly WFLs. Exhibits 75% of full digit flexion 45% on the left. LUE Sensation: WNL LUE Coordination: decreased fine motor   Lower Extremity Assessment Lower Extremity Assessment: Defer to PT evaluation   Cervical / Trunk Assessment Cervical / Trunk Assessment: Normal   Communication Communication Communication: No apparent difficulties   Cognition Arousal: Alert Behavior During Therapy: WFL for tasks assessed/performed Overall Cognitive Status: Within Functional Limits for tasks assessed                                                  Home Living Family/patient expects to be discharged to:: Private residence Living Arrangements: Spouse/significant other Available Help at Discharge: Family;Available 24 hours/day Type of Home: House Home Access: Stairs to  enter [employer] of Steps: 3-4 Entrance Stairs-Rails: None (posts) Home Layout: Two level;1/2 bath on main level Alternate Level Stairs-Number of Steps: 12 Alternate Level Stairs-Rails: Right Bathroom Shower/Tub: Chief Strategy Officer: Standard     Home Equipment: Grab bars - tub/shower   Additional Comments: may stay downstairs on the sofa      Prior Functioning/Environment Prior Level of Function : Independent/Modified Independent                        OT Problem List: Decreased strength;Impaired balance (sitting and/or standing);Pain;Decreased coordination;Decreased knowledge of use of DME or AE;Decreased range of motion;Impaired UE functional use      OT Treatment/Interventions: Self-care/ADL training;DME and/or AE instruction;Therapeutic activities;Balance training;Patient/family education;Neuromuscular education;Therapeutic exercise    OT Goals(Current goals can be found in the care plan section) Acute Rehab OT Goals Patient Stated Goal: Pt wants to go home soon if possible OT Goal Formulation: With patient Time For Goal Achievement: 08/27/23 Potential to Achieve Goals: Good ADL Goals Pt Will Perform Grooming: standing;with supervision Pt Will Perform Lower Body Bathing: with  supervision;sit to/from stand;with adaptive equipment Pt Will Perform Lower Body Dressing: with supervision;with adaptive equipment;sit to/from stand Pt Will Transfer to Toilet: with supervision;bedside commode;ambulating Pt Will Perform Toileting - Clothing Manipulation and hygiene: with supervision;sit to/from stand Pt/caregiver will Perform Home Exercise Program: Increased ROM;Both right and left upper extremity;With written HEP provided;With theraputty  OT Frequency: Min 1X/week       AM-PAC OT "6 Clicks" Daily Activity     Outcome Measure Help from another person eating meals?: A Little Help from another person taking care of personal grooming?: A  Little Help from another person toileting, which includes using toliet, bedpan, or urinal?: A Little Help from another person bathing (including washing, rinsing, drying)?: A Little Help from another person to put on and taking off regular upper body clothing?: A Little Help from another person to put on and taking off regular lower body clothing?: A Lot 6 Click Score: 17   End of Session Equipment Utilized During Treatment: Gait belt Nurse Communication: Mobility status  Activity Tolerance: Patient tolerated treatment well Patient left: in bed;with call bell/phone within reach  OT Visit Diagnosis: Unsteadiness on feet (R26.81);Other abnormalities of gait and mobility (R26.89);Repeated falls (R29.6);Pain Pain - Right/Left: Left Pain - part of body: Hand                Time: 2952-8413 OT Time Calculation (min): 42 min Charges:  OT General Charges $OT Visit: 1 Visit OT Evaluation $OT Eval Moderate Complexity: 1 Mod OT Treatments $Self Care/Home Management : 23-37 mins   Perrin Maltese, OTR/L Acute Rehabilitation Services  Office [phone] 08/13/2023

## 2023-08-13 NOTE — Progress Notes (Signed)
Echocardiogram 2D Echocardiogram has been performed.  Warren Lacy Kaladin Noseworthy RDCS 08/13/2023, 12:01 PM

## 2023-08-13 NOTE — Progress Notes (Signed)
CSW attempted to reach patient's wife Eber Jones without success - a voicemail was left requesting a return call.  Edwin Dada, MSW, LCSW Transitions of Care  Clinical Social Worker II [phone]

## 2023-08-13 NOTE — ED Notes (Signed)
Laying BP 139/73 Sitting 106/62 Standing 118/72 Heart rate minimal changes, 64-66

## 2023-08-13 NOTE — Care Management Obs Status (Signed)
MEDICARE OBSERVATION STATUS NOTIFICATION   Patient Details  Name: Daniel Yu MRN: [id number] Date of Birth: [birth year]   Medicare Observation Status Notification Given:  Yes    Ronny Bacon, RN 08/13/2023, 3:02 PM

## 2023-08-14 ENCOUNTER — Other Ambulatory Visit (HOSPITAL_COMMUNITY): Payer: Self-pay

## 2023-08-14 NOTE — Telephone Encounter (Signed)
Pharmacy Patient Advocate Encounter   Received notification from CoverMyMeds that prior authorization for Vyndamax 61mg  caps is required/requested.   Insurance verification completed.   The patient is insured through [facility] .   Per test claim: Refill too soon. PA is not needed at this time. Medication was filled 08/14/23. Next eligible fill date is 09/06/23.

## 2023-08-16 ENCOUNTER — Encounter (HOSPITAL_BASED_OUTPATIENT_CLINIC_OR_DEPARTMENT_OTHER): Payer: Self-pay | Admitting: Emergency Medicine

## 2023-08-16 ENCOUNTER — Emergency Department (HOSPITAL_BASED_OUTPATIENT_CLINIC_OR_DEPARTMENT_OTHER)
Admission: EM | Admit: 2023-08-16 | Discharge: 2023-08-16 | Disposition: A | Discharge status: Home / Self Care | Payer: Medicare Other

## 2023-08-16 ENCOUNTER — Other Ambulatory Visit: Payer: Self-pay

## 2023-08-16 DIAGNOSIS — I130 Hypertensive heart and chronic kidney disease with heart failure and stage 1 through stage 4 chronic kidney disease, or unspecified chronic kidney disease: Secondary | ICD-10-CM | POA: Diagnosis not present

## 2023-08-16 DIAGNOSIS — S61411D Laceration without foreign body of right hand, subsequent encounter: Secondary | ICD-10-CM | POA: Diagnosis not present

## 2023-08-16 DIAGNOSIS — S0181XD Laceration without foreign body of other part of head, subsequent encounter: Secondary | ICD-10-CM | POA: Diagnosis not present

## 2023-08-16 DIAGNOSIS — I10 Essential (primary) hypertension: Secondary | ICD-10-CM | POA: Diagnosis not present

## 2023-08-16 DIAGNOSIS — Z9181 History of falling: Secondary | ICD-10-CM | POA: Diagnosis not present

## 2023-08-16 DIAGNOSIS — L03113 Cellulitis of right upper limb: Secondary | ICD-10-CM | POA: Insufficient documentation

## 2023-08-16 DIAGNOSIS — J45909 Unspecified asthma, uncomplicated: Secondary | ICD-10-CM | POA: Diagnosis not present

## 2023-08-16 DIAGNOSIS — Z7901 Long term (current) use of anticoagulants: Secondary | ICD-10-CM | POA: Diagnosis not present

## 2023-08-16 DIAGNOSIS — N1831 Chronic kidney disease, stage 3a: Secondary | ICD-10-CM | POA: Diagnosis not present

## 2023-08-16 DIAGNOSIS — W19XXXA Unspecified fall, initial encounter: Secondary | ICD-10-CM | POA: Insufficient documentation

## 2023-08-16 DIAGNOSIS — S51011A Laceration without foreign body of right elbow, initial encounter: Secondary | ICD-10-CM | POA: Insufficient documentation

## 2023-08-16 DIAGNOSIS — S81011D Laceration without foreign body, right knee, subsequent encounter: Secondary | ICD-10-CM | POA: Diagnosis not present

## 2023-08-16 DIAGNOSIS — L0390 Cellulitis, unspecified: Secondary | ICD-10-CM

## 2023-08-16 DIAGNOSIS — I480 Paroxysmal atrial fibrillation: Secondary | ICD-10-CM | POA: Diagnosis not present

## 2023-08-16 DIAGNOSIS — I5032 Chronic diastolic (congestive) heart failure: Secondary | ICD-10-CM | POA: Diagnosis not present

## 2023-08-16 DIAGNOSIS — S59901A Unspecified injury of right elbow, initial encounter: Secondary | ICD-10-CM | POA: Diagnosis present

## 2023-08-16 DIAGNOSIS — Z7984 Long term (current) use of oral hypoglycemic drugs: Secondary | ICD-10-CM | POA: Diagnosis not present

## 2023-08-16 DIAGNOSIS — I2510 Atherosclerotic heart disease of native coronary artery without angina pectoris: Secondary | ICD-10-CM | POA: Diagnosis not present

## 2023-08-16 MED ORDER — DOXYCYCLINE HYCLATE 100 MG PO CAPS: 100.0000 mg | ORAL_CAPSULE | Freq: Two times a day (BID) | ORAL | 0 refills | Status: DC

## 2023-08-16 MED ORDER — DOXYCYCLINE HYCLATE 100 MG PO TABS
100.0000 mg | ORAL_TABLET | Freq: Once | ORAL | Status: AC
  Administered 2023-08-16: 100 mg via ORAL
  Filled 2023-08-16: qty 1

## 2023-08-16 NOTE — ED Provider Notes (Signed)
[place] EMERGENCY DEPARTMENT AT [facility] Provider Note   CSN: [id number] Arrival date & time: 08/16/23  1550     History  Chief Complaint  Patient presents with   Wound Check    Daniel Yu is a 89 y.o. male.  89 year old male with past medical history of atrial fibrillation on Eliquis, hypertension, and hyperlipidemia presenting to the emergency department today after being sent in by his physical therapist.  The patient recently was discharged from the hospital after a syncopal episode and fall.  The patient does have a laceration over his face, right knee, and a skin tear over his right hand.  It was noted that the patient was having some redness surrounding the wound on the right arm so sent to the ER for further evaluation regarding this.  He denies any fevers.  Denies any significant pain from the area.  His wife has been changing these once per day.   Wound Check       Home Medications Prior to Admission medications   Medication Sig Start Date End Date Taking? Authorizing Provider  doxycycline (VIBRAMYCIN) 100 MG capsule Take 1 capsule (100 mg total) by mouth 2 (two) times daily. 08/16/23  Yes Durwin Glaze, MD  amiodarone (PACERONE) 200 MG tablet Take 1 tablet (200 mg total) by mouth daily. 01/08/23   Lewayne Bunting, MD  cetirizine (ZYRTEC) 10 MG tablet Take 10 mg by mouth at bedtime.    [provider]  cyanocobalamin (VITAMIN B12) 1000 MCG tablet Take 1,000 mcg by mouth every other day.    [provider]  ELIQUIS 5 MG TABS tablet TAKE 1 TABLET(5 MG) BY MOUTH TWICE DAILY 05/14/23   Lewayne Bunting, MD  FARXIGA 10 MG TABS tablet Take 1 tablet (10 mg total) by mouth daily before breakfast. 10/20/22   Lewayne Bunting, MD  Fluticasone Furoate (ARNUITY ELLIPTA) 100 MCG/ACT AEPB Inhale 1 puff into the lungs in the morning. 10/19/22   [provider]  furosemide (LASIX) 20 MG tablet TAKE 2 TABLETS(40 MG) BY MOUTH DAILY 05/29/23    Lewayne Bunting, MD  rosuvastatin (CRESTOR) 10 MG tablet TAKE 1 TABLET(10 MG) BY MOUTH DAILY 07/11/23   Lewayne Bunting, MD  spironolactone (ALDACTONE) 25 MG tablet TAKE 1 TABLET(25 MG) BY MOUTH DAILY Patient taking differently: 12.5 mg alternating with 25 mg every other day 06/04/23   Lewayne Bunting, MD  Tafamidis [facility]) 61 MG CAPS Take 1 capsule (61 mg total) by mouth daily. 07/03/23   Lewayne Bunting, MD      Allergies    Atorvastatin, Bee venom, and Ivp dye [iodinated contrast media]    Review of Systems   Review of Systems  Skin:  Positive for wound.  All other systems reviewed and are negative.   Physical Exam Updated Vital Signs BP (!) 142/84   Pulse 66   Temp 98.2 F (36.8 C) (Oral)   Resp 20   SpO2 98%  Physical Exam Vitals and nursing note reviewed.   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  The patient's right knee wound does show no significant dehiscence but there is a skin tear noted over the lateral aspect.  There is no drainage or surrounding erythema noted.  The patient does have a relatively large skin tear noted over the right dorsal wrist that is hemostatic with very mild oozing noted.  There is some surrounding erythema noted.  Patient has normal range of motion of the wrist and hand both actively and passively. Psyc: acting appropriately   ED Results / Procedures / Treatments   Labs (all labs ordered are listed, but only abnormal results  are displayed) Labs Reviewed - No data to display  EKG None  Radiology No results found.  Procedures Procedures    Medications Ordered in ED Medications  doxycycline (VIBRA-TABS) tablet 100 mg (100 mg Oral Given 08/16/23 1729)    ED Course/ Medical Decision Making/ A&P                                 Medical Decision Making 89 year old male with past medical history of atrial fibrillation, hypertension, and hyperlipidemia presenting to the emergency department today with concern for possible infection of the wound surrounding a skin tear on his right hand/wrist.  There are some surrounding erythema.  No significant drainage is noted.  The patient has normal range of motion of his wrist and hand so suspicion for septic arthritis is low at this time.  Start the patient on doxycycline.  He is otherwise well-appearing with stable vital signs.  I will reach out to our social workers here to see if we can arrange for home health for him for dressing changes and to keep an eye on the wound.  I think that he is appropriate candidate for oral antibiotics and outpatient treatment at this time.  The patient's dressings were changed here.  Our social work team is reaching out to help facilitate home health care for the patient.  Orders placed here in the chart for this.  He is discharged with return precautions.  Risk Prescription drug management.           Final Clinical Impression(s) / ED Diagnoses Final diagnoses:  Cellulitis, unspecified cellulitis site  Skin tear of right elbow without complication, initial encounter    Rx / DC Orders ED Discharge Orders          Ordered    doxycycline (VIBRAMYCIN) 100 MG capsule  2 times daily        08/16/23 1836              Durwin Glaze, MD 08/16/23 Paulo Fruit

## 2023-08-16 NOTE — Discharge Instructions (Addendum)
Please take the antibiotic twice daily.  Continue changing the dressings every 24-48 hours.  I did make a consult to our social workers and they are working on getting some visiting nurses out to your house for reevaluation.  Call to schedule a follow-up appointment with the wound care center.  If you have worsening symptoms please go to [facility] or [facility] as this may require admission at that point if the oral antibiotics are not helping.

## 2023-08-16 NOTE — ED Triage Notes (Signed)
Fall on 08/12/23- several injuries  Comes in today with concerned about right hand wound bleeding and warm to touch

## 2023-08-16 NOTE — ED Notes (Signed)
Discharge instructions, follow up care, and prescriptions reviewed and explained, pt verbalized understanding.  

## 2023-08-16 NOTE — Care Management (Signed)
Received consult from EDP at [facility] concerning adding another discipline to his [facility] service. HH was set up last week for PT/OT . EDP would like to add [facility] for wound care. Contacte Angie at [place] with referral for [facility] RN . Confirmed a nurse will go out to visit 24-48 hours post discharge.  Also placed order for rolling walker will send into [facility] in the am. No Further TOC needs identified.

## 2023-08-17 ENCOUNTER — Ambulatory Visit (HOSPITAL_COMMUNITY): Payer: Medicare Other | Attending: Cardiology

## 2023-08-18 DIAGNOSIS — I5032 Chronic diastolic (congestive) heart failure: Secondary | ICD-10-CM | POA: Diagnosis not present

## 2023-08-18 DIAGNOSIS — S0181XD Laceration without foreign body of other part of head, subsequent encounter: Secondary | ICD-10-CM | POA: Diagnosis not present

## 2023-08-18 DIAGNOSIS — I130 Hypertensive heart and chronic kidney disease with heart failure and stage 1 through stage 4 chronic kidney disease, or unspecified chronic kidney disease: Secondary | ICD-10-CM | POA: Diagnosis not present

## 2023-08-18 DIAGNOSIS — N1831 Chronic kidney disease, stage 3a: Secondary | ICD-10-CM | POA: Diagnosis not present

## 2023-08-18 DIAGNOSIS — I480 Paroxysmal atrial fibrillation: Secondary | ICD-10-CM | POA: Diagnosis not present

## 2023-08-18 DIAGNOSIS — S81011D Laceration without foreign body, right knee, subsequent encounter: Secondary | ICD-10-CM | POA: Diagnosis not present

## 2023-08-20 DIAGNOSIS — S0181XD Laceration without foreign body of other part of head, subsequent encounter: Secondary | ICD-10-CM | POA: Diagnosis not present

## 2023-08-20 DIAGNOSIS — S81011D Laceration without foreign body, right knee, subsequent encounter: Secondary | ICD-10-CM | POA: Diagnosis not present

## 2023-08-20 DIAGNOSIS — I480 Paroxysmal atrial fibrillation: Secondary | ICD-10-CM | POA: Diagnosis not present

## 2023-08-20 DIAGNOSIS — I5032 Chronic diastolic (congestive) heart failure: Secondary | ICD-10-CM | POA: Diagnosis not present

## 2023-08-20 DIAGNOSIS — N1831 Chronic kidney disease, stage 3a: Secondary | ICD-10-CM | POA: Diagnosis not present

## 2023-08-20 DIAGNOSIS — I130 Hypertensive heart and chronic kidney disease with heart failure and stage 1 through stage 4 chronic kidney disease, or unspecified chronic kidney disease: Secondary | ICD-10-CM | POA: Diagnosis not present

## 2023-08-23 DIAGNOSIS — E854 Organ-limited amyloidosis: Secondary | ICD-10-CM | POA: Diagnosis not present

## 2023-08-23 DIAGNOSIS — S022XXA Fracture of nasal bones, initial encounter for closed fracture: Secondary | ICD-10-CM | POA: Diagnosis not present

## 2023-08-23 DIAGNOSIS — I5032 Chronic diastolic (congestive) heart failure: Secondary | ICD-10-CM | POA: Diagnosis not present

## 2023-08-23 DIAGNOSIS — S022XXD Fracture of nasal bones, subsequent encounter for fracture with routine healing: Secondary | ICD-10-CM | POA: Diagnosis not present

## 2023-08-23 DIAGNOSIS — N1831 Chronic kidney disease, stage 3a: Secondary | ICD-10-CM | POA: Diagnosis not present

## 2023-08-23 DIAGNOSIS — I951 Orthostatic hypotension: Secondary | ICD-10-CM | POA: Diagnosis not present

## 2023-08-23 DIAGNOSIS — Z4802 Encounter for removal of sutures: Secondary | ICD-10-CM | POA: Diagnosis not present

## 2023-08-24 DIAGNOSIS — I130 Hypertensive heart and chronic kidney disease with heart failure and stage 1 through stage 4 chronic kidney disease, or unspecified chronic kidney disease: Secondary | ICD-10-CM | POA: Diagnosis not present

## 2023-08-24 DIAGNOSIS — I5032 Chronic diastolic (congestive) heart failure: Secondary | ICD-10-CM | POA: Diagnosis not present

## 2023-08-24 DIAGNOSIS — N1831 Chronic kidney disease, stage 3a: Secondary | ICD-10-CM | POA: Diagnosis not present

## 2023-08-24 DIAGNOSIS — S0181XD Laceration without foreign body of other part of head, subsequent encounter: Secondary | ICD-10-CM | POA: Diagnosis not present

## 2023-08-24 DIAGNOSIS — S81011D Laceration without foreign body, right knee, subsequent encounter: Secondary | ICD-10-CM | POA: Diagnosis not present

## 2023-08-24 DIAGNOSIS — I480 Paroxysmal atrial fibrillation: Secondary | ICD-10-CM | POA: Diagnosis not present

## 2023-08-29 DIAGNOSIS — I5032 Chronic diastolic (congestive) heart failure: Secondary | ICD-10-CM | POA: Diagnosis not present

## 2023-08-29 DIAGNOSIS — S0181XD Laceration without foreign body of other part of head, subsequent encounter: Secondary | ICD-10-CM | POA: Diagnosis not present

## 2023-08-29 DIAGNOSIS — I480 Paroxysmal atrial fibrillation: Secondary | ICD-10-CM | POA: Diagnosis not present

## 2023-08-29 DIAGNOSIS — S81011D Laceration without foreign body, right knee, subsequent encounter: Secondary | ICD-10-CM | POA: Diagnosis not present

## 2023-08-29 DIAGNOSIS — N1831 Chronic kidney disease, stage 3a: Secondary | ICD-10-CM | POA: Diagnosis not present

## 2023-08-29 DIAGNOSIS — I130 Hypertensive heart and chronic kidney disease with heart failure and stage 1 through stage 4 chronic kidney disease, or unspecified chronic kidney disease: Secondary | ICD-10-CM | POA: Diagnosis not present

## 2023-08-30 DIAGNOSIS — I130 Hypertensive heart and chronic kidney disease with heart failure and stage 1 through stage 4 chronic kidney disease, or unspecified chronic kidney disease: Secondary | ICD-10-CM | POA: Diagnosis not present

## 2023-08-30 DIAGNOSIS — S0181XD Laceration without foreign body of other part of head, subsequent encounter: Secondary | ICD-10-CM | POA: Diagnosis not present

## 2023-08-30 DIAGNOSIS — N1831 Chronic kidney disease, stage 3a: Secondary | ICD-10-CM | POA: Diagnosis not present

## 2023-08-30 DIAGNOSIS — I480 Paroxysmal atrial fibrillation: Secondary | ICD-10-CM | POA: Diagnosis not present

## 2023-08-30 DIAGNOSIS — I5032 Chronic diastolic (congestive) heart failure: Secondary | ICD-10-CM | POA: Diagnosis not present

## 2023-08-30 DIAGNOSIS — S81011D Laceration without foreign body, right knee, subsequent encounter: Secondary | ICD-10-CM | POA: Diagnosis not present

## 2023-09-03 DIAGNOSIS — I5032 Chronic diastolic (congestive) heart failure: Secondary | ICD-10-CM | POA: Diagnosis not present

## 2023-09-03 DIAGNOSIS — I480 Paroxysmal atrial fibrillation: Secondary | ICD-10-CM | POA: Diagnosis not present

## 2023-09-03 DIAGNOSIS — I130 Hypertensive heart and chronic kidney disease with heart failure and stage 1 through stage 4 chronic kidney disease, or unspecified chronic kidney disease: Secondary | ICD-10-CM | POA: Diagnosis not present

## 2023-09-03 DIAGNOSIS — N1831 Chronic kidney disease, stage 3a: Secondary | ICD-10-CM | POA: Diagnosis not present

## 2023-09-03 DIAGNOSIS — S81011D Laceration without foreign body, right knee, subsequent encounter: Secondary | ICD-10-CM | POA: Diagnosis not present

## 2023-09-03 DIAGNOSIS — S0181XD Laceration without foreign body of other part of head, subsequent encounter: Secondary | ICD-10-CM | POA: Diagnosis not present

## 2023-09-06 DIAGNOSIS — S81011D Laceration without foreign body, right knee, subsequent encounter: Secondary | ICD-10-CM | POA: Diagnosis not present

## 2023-09-06 DIAGNOSIS — N1831 Chronic kidney disease, stage 3a: Secondary | ICD-10-CM | POA: Diagnosis not present

## 2023-09-06 DIAGNOSIS — I5032 Chronic diastolic (congestive) heart failure: Secondary | ICD-10-CM | POA: Diagnosis not present

## 2023-09-06 DIAGNOSIS — I130 Hypertensive heart and chronic kidney disease with heart failure and stage 1 through stage 4 chronic kidney disease, or unspecified chronic kidney disease: Secondary | ICD-10-CM | POA: Diagnosis not present

## 2023-09-06 DIAGNOSIS — I480 Paroxysmal atrial fibrillation: Secondary | ICD-10-CM | POA: Diagnosis not present

## 2023-09-06 DIAGNOSIS — S0181XD Laceration without foreign body of other part of head, subsequent encounter: Secondary | ICD-10-CM | POA: Diagnosis not present

## 2023-09-07 DIAGNOSIS — I130 Hypertensive heart and chronic kidney disease with heart failure and stage 1 through stage 4 chronic kidney disease, or unspecified chronic kidney disease: Secondary | ICD-10-CM | POA: Diagnosis not present

## 2023-09-07 DIAGNOSIS — I480 Paroxysmal atrial fibrillation: Secondary | ICD-10-CM | POA: Diagnosis not present

## 2023-09-07 DIAGNOSIS — S0181XD Laceration without foreign body of other part of head, subsequent encounter: Secondary | ICD-10-CM | POA: Diagnosis not present

## 2023-09-07 DIAGNOSIS — N1831 Chronic kidney disease, stage 3a: Secondary | ICD-10-CM | POA: Diagnosis not present

## 2023-09-07 DIAGNOSIS — S81011D Laceration without foreign body, right knee, subsequent encounter: Secondary | ICD-10-CM | POA: Diagnosis not present

## 2023-09-07 DIAGNOSIS — I5032 Chronic diastolic (congestive) heart failure: Secondary | ICD-10-CM | POA: Diagnosis not present

## 2023-09-10 ENCOUNTER — Encounter: Payer: Self-pay | Admitting: Pharmacist

## 2023-09-10 DIAGNOSIS — N1831 Chronic kidney disease, stage 3a: Secondary | ICD-10-CM | POA: Diagnosis not present

## 2023-09-10 DIAGNOSIS — S81011D Laceration without foreign body, right knee, subsequent encounter: Secondary | ICD-10-CM | POA: Diagnosis not present

## 2023-09-10 DIAGNOSIS — S0181XD Laceration without foreign body of other part of head, subsequent encounter: Secondary | ICD-10-CM | POA: Diagnosis not present

## 2023-09-10 DIAGNOSIS — I480 Paroxysmal atrial fibrillation: Secondary | ICD-10-CM | POA: Diagnosis not present

## 2023-09-10 DIAGNOSIS — I130 Hypertensive heart and chronic kidney disease with heart failure and stage 1 through stage 4 chronic kidney disease, or unspecified chronic kidney disease: Secondary | ICD-10-CM | POA: Diagnosis not present

## 2023-09-10 DIAGNOSIS — I5032 Chronic diastolic (congestive) heart failure: Secondary | ICD-10-CM | POA: Diagnosis not present

## 2023-09-12 DIAGNOSIS — I130 Hypertensive heart and chronic kidney disease with heart failure and stage 1 through stage 4 chronic kidney disease, or unspecified chronic kidney disease: Secondary | ICD-10-CM | POA: Diagnosis not present

## 2023-09-12 DIAGNOSIS — I480 Paroxysmal atrial fibrillation: Secondary | ICD-10-CM | POA: Diagnosis not present

## 2023-09-12 DIAGNOSIS — N1831 Chronic kidney disease, stage 3a: Secondary | ICD-10-CM | POA: Diagnosis not present

## 2023-09-12 DIAGNOSIS — S0181XD Laceration without foreign body of other part of head, subsequent encounter: Secondary | ICD-10-CM | POA: Diagnosis not present

## 2023-09-12 DIAGNOSIS — S81011D Laceration without foreign body, right knee, subsequent encounter: Secondary | ICD-10-CM | POA: Diagnosis not present

## 2023-09-12 DIAGNOSIS — I5032 Chronic diastolic (congestive) heart failure: Secondary | ICD-10-CM | POA: Diagnosis not present

## 2023-09-13 ENCOUNTER — Ambulatory Visit
Admission: RE | Admit: 2023-09-13 | Discharge: 2023-09-13 | Disposition: A | Discharge status: Home / Self Care | Payer: Medicare Other | Source: Ambulatory Visit | Attending: Cardiology | Admitting: Cardiology

## 2023-09-13 DIAGNOSIS — Z23 Encounter for immunization: Secondary | ICD-10-CM | POA: Diagnosis not present

## 2023-09-13 DIAGNOSIS — E854 Organ-limited amyloidosis: Secondary | ICD-10-CM | POA: Diagnosis not present

## 2023-09-13 DIAGNOSIS — I5032 Chronic diastolic (congestive) heart failure: Secondary | ICD-10-CM | POA: Diagnosis not present

## 2023-09-13 DIAGNOSIS — I951 Orthostatic hypotension: Secondary | ICD-10-CM | POA: Diagnosis not present

## 2023-09-13 DIAGNOSIS — D7589 Other specified diseases of blood and blood-forming organs: Secondary | ICD-10-CM | POA: Diagnosis not present

## 2023-09-13 DIAGNOSIS — Z79899 Other long term (current) drug therapy: Secondary | ICD-10-CM | POA: Diagnosis not present

## 2023-09-13 DIAGNOSIS — I480 Paroxysmal atrial fibrillation: Secondary | ICD-10-CM

## 2023-09-13 DIAGNOSIS — N1831 Chronic kidney disease, stage 3a: Secondary | ICD-10-CM | POA: Diagnosis not present

## 2023-09-14 DIAGNOSIS — S0181XD Laceration without foreign body of other part of head, subsequent encounter: Secondary | ICD-10-CM | POA: Diagnosis not present

## 2023-09-14 DIAGNOSIS — I5032 Chronic diastolic (congestive) heart failure: Secondary | ICD-10-CM | POA: Diagnosis not present

## 2023-09-14 DIAGNOSIS — I130 Hypertensive heart and chronic kidney disease with heart failure and stage 1 through stage 4 chronic kidney disease, or unspecified chronic kidney disease: Secondary | ICD-10-CM | POA: Diagnosis not present

## 2023-09-14 DIAGNOSIS — N1831 Chronic kidney disease, stage 3a: Secondary | ICD-10-CM | POA: Diagnosis not present

## 2023-09-14 DIAGNOSIS — S81011D Laceration without foreign body, right knee, subsequent encounter: Secondary | ICD-10-CM | POA: Diagnosis not present

## 2023-09-14 DIAGNOSIS — I480 Paroxysmal atrial fibrillation: Secondary | ICD-10-CM | POA: Diagnosis not present

## 2023-09-17 DIAGNOSIS — I5032 Chronic diastolic (congestive) heart failure: Secondary | ICD-10-CM | POA: Diagnosis not present

## 2023-09-17 DIAGNOSIS — I130 Hypertensive heart and chronic kidney disease with heart failure and stage 1 through stage 4 chronic kidney disease, or unspecified chronic kidney disease: Secondary | ICD-10-CM | POA: Diagnosis not present

## 2023-09-17 DIAGNOSIS — S0181XD Laceration without foreign body of other part of head, subsequent encounter: Secondary | ICD-10-CM | POA: Diagnosis not present

## 2023-09-17 DIAGNOSIS — I480 Paroxysmal atrial fibrillation: Secondary | ICD-10-CM | POA: Diagnosis not present

## 2023-09-17 DIAGNOSIS — S81011D Laceration without foreign body, right knee, subsequent encounter: Secondary | ICD-10-CM | POA: Diagnosis not present

## 2023-09-17 DIAGNOSIS — N1831 Chronic kidney disease, stage 3a: Secondary | ICD-10-CM | POA: Diagnosis not present

## 2023-09-20 DIAGNOSIS — I480 Paroxysmal atrial fibrillation: Secondary | ICD-10-CM | POA: Diagnosis not present

## 2023-09-20 DIAGNOSIS — S81011D Laceration without foreign body, right knee, subsequent encounter: Secondary | ICD-10-CM | POA: Diagnosis not present

## 2023-09-20 DIAGNOSIS — S0181XD Laceration without foreign body of other part of head, subsequent encounter: Secondary | ICD-10-CM | POA: Diagnosis not present

## 2023-09-20 DIAGNOSIS — N1831 Chronic kidney disease, stage 3a: Secondary | ICD-10-CM | POA: Diagnosis not present

## 2023-09-20 DIAGNOSIS — I130 Hypertensive heart and chronic kidney disease with heart failure and stage 1 through stage 4 chronic kidney disease, or unspecified chronic kidney disease: Secondary | ICD-10-CM | POA: Diagnosis not present

## 2023-09-20 DIAGNOSIS — I5032 Chronic diastolic (congestive) heart failure: Secondary | ICD-10-CM | POA: Diagnosis not present

## 2023-09-26 DIAGNOSIS — N1831 Chronic kidney disease, stage 3a: Secondary | ICD-10-CM | POA: Diagnosis not present

## 2023-09-26 DIAGNOSIS — I480 Paroxysmal atrial fibrillation: Secondary | ICD-10-CM | POA: Diagnosis not present

## 2023-09-26 DIAGNOSIS — I5032 Chronic diastolic (congestive) heart failure: Secondary | ICD-10-CM | POA: Diagnosis not present

## 2023-09-26 DIAGNOSIS — S81011D Laceration without foreign body, right knee, subsequent encounter: Secondary | ICD-10-CM | POA: Diagnosis not present

## 2023-09-26 DIAGNOSIS — S0181XD Laceration without foreign body of other part of head, subsequent encounter: Secondary | ICD-10-CM | POA: Diagnosis not present

## 2023-09-26 DIAGNOSIS — I130 Hypertensive heart and chronic kidney disease with heart failure and stage 1 through stage 4 chronic kidney disease, or unspecified chronic kidney disease: Secondary | ICD-10-CM | POA: Diagnosis not present

## 2023-09-27 DIAGNOSIS — S0181XD Laceration without foreign body of other part of head, subsequent encounter: Secondary | ICD-10-CM | POA: Diagnosis not present

## 2023-09-27 DIAGNOSIS — I130 Hypertensive heart and chronic kidney disease with heart failure and stage 1 through stage 4 chronic kidney disease, or unspecified chronic kidney disease: Secondary | ICD-10-CM | POA: Diagnosis not present

## 2023-09-27 DIAGNOSIS — S81011D Laceration without foreign body, right knee, subsequent encounter: Secondary | ICD-10-CM | POA: Diagnosis not present

## 2023-09-27 DIAGNOSIS — N1831 Chronic kidney disease, stage 3a: Secondary | ICD-10-CM | POA: Diagnosis not present

## 2023-09-27 DIAGNOSIS — I480 Paroxysmal atrial fibrillation: Secondary | ICD-10-CM | POA: Diagnosis not present

## 2023-09-27 DIAGNOSIS — I5032 Chronic diastolic (congestive) heart failure: Secondary | ICD-10-CM | POA: Diagnosis not present

## 2023-10-03 DIAGNOSIS — I480 Paroxysmal atrial fibrillation: Secondary | ICD-10-CM | POA: Diagnosis not present

## 2023-10-03 DIAGNOSIS — N1831 Chronic kidney disease, stage 3a: Secondary | ICD-10-CM | POA: Diagnosis not present

## 2023-10-03 DIAGNOSIS — S81011D Laceration without foreign body, right knee, subsequent encounter: Secondary | ICD-10-CM | POA: Diagnosis not present

## 2023-10-03 DIAGNOSIS — I5032 Chronic diastolic (congestive) heart failure: Secondary | ICD-10-CM | POA: Diagnosis not present

## 2023-10-03 DIAGNOSIS — I130 Hypertensive heart and chronic kidney disease with heart failure and stage 1 through stage 4 chronic kidney disease, or unspecified chronic kidney disease: Secondary | ICD-10-CM | POA: Diagnosis not present

## 2023-10-03 DIAGNOSIS — S0181XD Laceration without foreign body of other part of head, subsequent encounter: Secondary | ICD-10-CM | POA: Diagnosis not present

## 2023-10-09 ENCOUNTER — Telehealth: Payer: Self-pay | Admitting: Pharmacy Technician

## 2023-10-09 NOTE — Telephone Encounter (Signed)
Pharmacy Patient Advocate Encounter  Received notification from [facility] that Prior Authorization for Vyndamax has been APPROVED from 10/09/23 to 08/20/24   PA #/Case ID/Reference #: [id number]

## 2023-10-09 NOTE — Telephone Encounter (Signed)
Pharmacy Patient Advocate Encounter   Received notification from CoverMyMeds that prior authorization for vyndamax is required/requested.   Insurance verification completed.   The patient is insured through [facility] .   Per test claim: PA required; PA submitted to above mentioned insurance via CoverMyMeds Key/confirmation #/EOC [id number] Status is pending

## 2023-10-28 ENCOUNTER — Other Ambulatory Visit: Payer: Self-pay | Admitting: Cardiology

## 2023-11-30 ENCOUNTER — Other Ambulatory Visit: Payer: Self-pay | Admitting: Cardiology

## 2023-11-30 DIAGNOSIS — R0609 Other forms of dyspnea: Secondary | ICD-10-CM

## 2023-12-12 ENCOUNTER — Other Ambulatory Visit: Payer: Self-pay | Admitting: Cardiology

## 2023-12-13 ENCOUNTER — Other Ambulatory Visit (HOSPITAL_COMMUNITY): Payer: Self-pay

## 2023-12-13 MED ORDER — VYNDAMAX 61 MG PO CAPS: 1.0000 | ORAL_CAPSULE | Freq: Every day | ORAL | 2 refills | Status: DC

## 2023-12-13 NOTE — Addendum Note (Signed)
 Addended by: Gayleen Kawasaki D on: 12/13/2023 01:56 PM   Modules accepted: Orders

## 2023-12-14 ENCOUNTER — Telehealth: Payer: Self-pay | Admitting: Pharmacy Technician

## 2023-12-14 ENCOUNTER — Other Ambulatory Visit: Payer: Self-pay

## 2023-12-14 MED ORDER — VYNDAMAX 61 MG PO CAPS: 1.0000 | ORAL_CAPSULE | Freq: Every day | ORAL | 2 refills | Status: DC

## 2023-12-14 NOTE — Telephone Encounter (Signed)
 Pt's medication resent to requested pharmacy.

## 2023-12-14 NOTE — Telephone Encounter (Signed)
   Hi can someone send the prescription for vyndamax  61mg  to Pharmacy  [facility] - [place], [place] - [address], [place] [address] Phone: [phone]  Fax: [phone]    Instead of the [facility] pharmacy where it was sent on 12/13/23. Thank you!

## 2023-12-14 NOTE — Addendum Note (Signed)
 Addended by: Gayleen Kawasaki D on: 12/14/2023 09:39 AM   Modules accepted: Orders

## 2023-12-17 MED ORDER — VYNDAMAX 61 MG PO CAPS: 1.0000 | ORAL_CAPSULE | Freq: Every day | ORAL | 2 refills | Status: DC

## 2023-12-17 NOTE — Addendum Note (Signed)
 Addended by: Gayleen Kawasaki D on: 12/17/2023 09:21 AM   Modules accepted: Orders

## 2023-12-19 DIAGNOSIS — R310 Gross hematuria: Secondary | ICD-10-CM | POA: Diagnosis not present

## 2023-12-23 ENCOUNTER — Other Ambulatory Visit: Payer: Self-pay | Admitting: Cardiology

## 2023-12-24 ENCOUNTER — Ambulatory Visit
Admission: RE | Admit: 2023-12-24 | Discharge: 2023-12-24 | Disposition: A | Discharge status: Home / Self Care | Source: Ambulatory Visit | Attending: Internal Medicine | Admitting: Internal Medicine

## 2023-12-24 ENCOUNTER — Other Ambulatory Visit: Payer: Self-pay | Admitting: Internal Medicine

## 2023-12-24 DIAGNOSIS — M545 Low back pain, unspecified: Secondary | ICD-10-CM

## 2023-12-27 NOTE — Progress Notes (Signed)
 HPI: Follow-up atrial fibrillation and amyloid. Patient with history of PAF. Underwent successful cardioversion February 13, 2022 on amiodarone .  Also with history of bradycardia requiring discontinuation of metoprolol  and Cardizem .  PET scan September 2023 showed concern for balanced ischemia but perfusion was normal, severe coronary calcification and flow was markedly abnormal suggestive of multivessel disease.  Ejection fraction 42% which decreased to 34% with exercise. Cardiac catheterization October 2023 showed 10% left main, 40% LAD and ejection fraction 50 to 55%; left ventricular end-diastolic pressure 24 mmHg. 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  Patient seen by oncology and underwent bone marrow biopsy which did not show multiple myeloma, amyloidosis or lymphoma.  He has been initiated on tafamidis .  Had syncopal episode December 2024 felt secondary to orthostasis.  Echocardiogram December 2024 showed ejection fraction 50 to 55%, mild left ventricular hypertrophy, grade 2 diastolic dysfunction, severe left atrial enlargement, mild right atrial enlargement, mild aortic insufficiency.  Since last seen, he denies dyspnea, chest pain, palpitations or syncope.  Current Outpatient Medications  Medication Sig Dispense Refill   amiodarone  (PACERONE ) 200 MG tablet TAKE 1 TABLET(200 MG) BY MOUTH DAILY 90 tablet 1   cetirizine (ZYRTEC) 10 MG tablet Take 10 mg by mouth at bedtime.     cyanocobalamin  (VITAMIN B12) 1000 MCG tablet Take 1,000 mcg by mouth every other day.     doxycycline  (VIBRAMYCIN ) 100 MG capsule Take 1 capsule (100 mg total) by mouth 2 (two) times daily. 20 capsule 0   ELIQUIS  5 MG TABS tablet TAKE 1 TABLET(5 MG) BY MOUTH TWICE DAILY 180 tablet 1   FARXIGA  10 MG TABS tablet TAKE 1 TABLET(10 MG) BY MOUTH DAILY BEFORE BREAKFAST 90 tablet 3   Fluticasone  Furoate (ARNUITY ELLIPTA ) 100 MCG/ACT AEPB Inhale 1 puff into the lungs in the morning.     furosemide  (LASIX )  20 MG tablet TAKE 2 TABLETS(40 MG) BY MOUTH DAILY 180 tablet 2   rosuvastatin  (CRESTOR ) 10 MG tablet TAKE 1 TABLET(10 MG) BY MOUTH DAILY 90 tablet 1   spironolactone  (ALDACTONE ) 25 MG tablet TAKE 1 TABLET(25 MG) BY MOUTH DAILY (Patient taking differently: No sig reported) 90 tablet 3   Tafamidis  (VYNDAMAX ) 61 MG CAPS Take 1 capsule (61 mg total) by mouth daily. 90 capsule 2   No current facility-administered medications for this visit.     Past Medical History:  Diagnosis Date   Arthritis    right knee oa   Asthma    Carpal tunnel syndrome    BILATERAL   Cholecystitis    Complication of anesthesia    adverse reaction to the spinal   Hypertension    PAF (paroxysmal atrial fibrillation) (HCC)    SBO (small bowel obstruction) (HCC) 12/2015    Past Surgical History:  Procedure Laterality Date   CARDIOVERSION N/A 02/14/2017   Procedure: CARDIOVERSION;  Surgeon: Loyde Rule, MD;  Location: [facility] ENDOSCOPY;  Service: Cardiovascular;  Laterality: N/A;   CARDIOVERSION N/A 12/11/2019   Procedure: CARDIOVERSION;  Surgeon: Lenise Quince, MD;  Location: [facility] ENDOSCOPY;  Service: Cardiovascular;  Laterality: N/A;   CARDIOVERSION N/A 02/13/2022   Procedure: CARDIOVERSION;  Surgeon: Lenise Quince, MD;  Location: [facility] ENDOSCOPY;  Service: Cardiovascular;  Laterality: N/A;   CHOLECYSTECTOMY  02/09/2017   CHOLECYSTECTOMY N/A 02/09/2017   Procedure: LAPAROSCOPIC CHOLECYSTECTOMY WITH INTRAOPERATIVE CHOLANGIOGRAM;  Surgeon: Dareen Ebbing, MD;  Location: [facility] OR;  Service: General;  Laterality: N/A;   COLONOSCOPY WITH PROPOFOL  N/A 11/09/2014   Procedure:  COLONOSCOPY WITH PROPOFOL ;  Surgeon: Garrett Kallman, MD;  Location: [facility] ENDOSCOPY;  Service: Endoscopy;  Laterality: N/A;   HERNIA REPAIR     2013   LEFT HEART CATH AND CORONARY ANGIOGRAPHY N/A 05/23/2022   Procedure: LEFT HEART CATH AND CORONARY ANGIOGRAPHY;  Surgeon: Swaziland, Peter M, MD;  Location: [facility] INVASIVE CV LAB;  Service: Cardiovascular;   Laterality: N/A;   QUADRICEPS TENDON REPAIR Left 06/07/2015   Procedure: REPAIR LEFT QUADRICEP TENDON;  Surgeon: Liliane Rei, MD;  Location: [facility] ORS;  Service: Orthopedics;  Laterality: Left;   TEE WITHOUT CARDIOVERSION N/A 02/14/2017   Procedure: TRANSESOPHAGEAL ECHOCARDIOGRAM (TEE);  Surgeon: Loyde Rule, MD;  Location[facility] ENDOSCOPY;  Service: Cardiovascular;  Laterality: N/A;   TOTAL KNEE ARTHROPLASTY Right 12/27/2015   Procedure: RIGHT TOTAL KNEE ARTHROPLASTY;  Surgeon: Liliane Rei, MD;  Location: [facility] ORS;  Service: Orthopedics;  Laterality: Right;    Social History   Socioeconomic History   Marital status: Married    Spouse name: Orelia Binet   Number of children: 2   Years of education: Not on file   Highest education level: Master's degree (e.g., MA, MS, MEng, MEd, MSW, MBA)  Occupational History   Occupation: retired  Tobacco Use   Smoking status: Never   Smokeless tobacco: Never  Vaping Use   Vaping status: Never Used  Substance and Sexual Activity   Alcohol use: Yes    Comment: 1 beer daily   Drug use: No   Sexual activity: Not on file  Other Topics Concern   Not on file  Social History Narrative   Not on file   Social Drivers of Health   Financial Resource Strain: Low Risk  (06/06/2022)   Overall Financial Resource Strain (CARDIA)    Difficulty of Paying Living Expenses: Not hard at all  Food Insecurity: No Food Insecurity (08/13/2023)   Hunger Vital Sign    Worried About Running Out of Food in the Last Year: Never true    Ran Out of Food in the Last Year: Never true  Transportation Needs: No Transportation Needs (08/13/2023)   PRAPARE - Administrator, Civil Service (Medical): No    Lack of Transportation (Non-Medical): No  Physical Activity: Not on file  Stress: Not on file  Social Connections: Not on file  Intimate Partner Violence: Not At Risk (08/15/2022)   Humiliation, Afraid, Rape, and Kick questionnaire    Fear of Current or Ex-Partner:  No    Emotionally Abused: No    Physically Abused: No    Sexually Abused: No    Family History  Problem Relation Age of Onset   Hypertension Father     ROS: no fevers or chills, productive cough, hemoptysis, dysphasia, odynophagia, melena, hematochezia, dysuria, hematuria, rash, seizure activity, orthopnea, PND, pedal edema, claudication. Remaining systems are negative.  Physical Exam: Well-developed well-nourished in no acute distress.  Skin is warm and dry.  HEENT is normal.  Neck is supple.  Chest is clear to auscultation with normal expansion.  Cardiovascular exam is regular rate and rhythm.  Abdominal exam nontender or distended. No masses palpated. Extremities show no edema. neuro grossly intact  EKG Interpretation Date/Time:  Thursday Jan 10 2024 09:07:31 EDT Ventricular Rate:  62 PR Interval:  160 QRS Duration:  100 QT Interval:  446 QTC Calculation: 452 R Axis:   13  Text Interpretation: Normal sinus rhythm Normal ECG Confirmed by Alexandria Angel ([id number]) on 01/10/2024 9:11:56 AM    A/P  1 amyloidosis-continue  tafamidis .  Check hemoglobin and renal function.  2 paroxysmal atrial fibrillation-patient remains in sinus rhythm.  Continue amiodarone  and apixaban .  Check hemoglobin, renal function, liver functions, TSH.  Note patient had an orthostatic event in December.  He also fell in February by his report as his left leg was weak.  He has been stable since that time.  I discussed Watchman device to potentially discontinue anticoagulation in the future.  He would like to discuss this with his wife and consider before proceeding.  3 chronic diastolic congestive heart failure-patient is euvolemic on examination.  Will continue Farxiga  and diuretics at present dose.  Check potassium and renal function.  4 hypertension-patient has had difficulties with orthostasis.  Will allow blood pressure to run higher if possible.  5 history of cardiomyopathy-LV function has  improved on most recent echocardiogram.  6 history of mild coronary artery disease-continue statin.  Is not having chest pain.  7 hyperlipidemia-continue statin.  Check lipids and liver.  Alexandria Angel, MD

## 2024-01-06 ENCOUNTER — Other Ambulatory Visit: Payer: Self-pay | Admitting: Cardiology

## 2024-01-06 DIAGNOSIS — E7800 Pure hypercholesterolemia, unspecified: Secondary | ICD-10-CM

## 2024-01-07 DIAGNOSIS — L814 Other melanin hyperpigmentation: Secondary | ICD-10-CM | POA: Diagnosis not present

## 2024-01-07 DIAGNOSIS — L570 Actinic keratosis: Secondary | ICD-10-CM | POA: Diagnosis not present

## 2024-01-07 DIAGNOSIS — C44319 Basal cell carcinoma of skin of other parts of face: Secondary | ICD-10-CM | POA: Diagnosis not present

## 2024-01-07 DIAGNOSIS — Z85828 Personal history of other malignant neoplasm of skin: Secondary | ICD-10-CM | POA: Diagnosis not present

## 2024-01-07 DIAGNOSIS — L821 Other seborrheic keratosis: Secondary | ICD-10-CM | POA: Diagnosis not present

## 2024-01-08 DIAGNOSIS — I480 Paroxysmal atrial fibrillation: Secondary | ICD-10-CM | POA: Diagnosis not present

## 2024-01-08 DIAGNOSIS — M545 Low back pain, unspecified: Secondary | ICD-10-CM | POA: Diagnosis not present

## 2024-01-08 DIAGNOSIS — I5032 Chronic diastolic (congestive) heart failure: Secondary | ICD-10-CM | POA: Diagnosis not present

## 2024-01-08 DIAGNOSIS — D7589 Other specified diseases of blood and blood-forming organs: Secondary | ICD-10-CM | POA: Diagnosis not present

## 2024-01-08 DIAGNOSIS — N1831 Chronic kidney disease, stage 3a: Secondary | ICD-10-CM | POA: Diagnosis not present

## 2024-01-10 ENCOUNTER — Encounter: Payer: Self-pay | Admitting: Cardiology

## 2024-01-10 ENCOUNTER — Ambulatory Visit: Payer: Medicare Other | Attending: Cardiology | Admitting: Cardiology

## 2024-01-10 ENCOUNTER — Encounter: Payer: Self-pay | Admitting: *Deleted

## 2024-01-10 VITALS — BP 122/62 | HR 65 | Ht 70.0 in | Wt 184.0 lb

## 2024-01-10 DIAGNOSIS — E7800 Pure hypercholesterolemia, unspecified: Secondary | ICD-10-CM | POA: Diagnosis not present

## 2024-01-10 DIAGNOSIS — I10 Essential (primary) hypertension: Secondary | ICD-10-CM | POA: Diagnosis not present

## 2024-01-10 DIAGNOSIS — I5032 Chronic diastolic (congestive) heart failure: Secondary | ICD-10-CM

## 2024-01-10 DIAGNOSIS — E854 Organ-limited amyloidosis: Secondary | ICD-10-CM

## 2024-01-10 DIAGNOSIS — I480 Paroxysmal atrial fibrillation: Secondary | ICD-10-CM

## 2024-01-10 DIAGNOSIS — I43 Cardiomyopathy in diseases classified elsewhere: Secondary | ICD-10-CM

## 2024-01-10 DIAGNOSIS — I2510 Atherosclerotic heart disease of native coronary artery without angina pectoris: Secondary | ICD-10-CM

## 2024-01-10 NOTE — Patient Instructions (Signed)
 Medication Instructions:   NO CHANGE  *If you need a refill on your cardiac medications before your next appointment, please call your pharmacy*  Lab Work:  Your physician recommends that you HAVE LAB WORK TODAY  If you have labs (blood work) drawn today and your tests are completely normal, you will receive your results only by: MyChart Message (if you have MyChart) OR A paper copy in the mail If you have any lab test that is abnormal or we need to change your treatment, we will call you to review the results.   Follow-Up: At [facility], you and your health needs are our priority.  As part of our continuing mission to provide you with exceptional heart care, our providers are all part of one team.  This team includes your primary Cardiologist (physician) and Advanced Practice Providers or APPs (Physician Assistants and Nurse Practitioners) who all work together to provide you with the care you need, when you need it.  Your next appointment:   6 month(s)  Provider:   Alexandria Angel, MD

## 2024-01-11 ENCOUNTER — Ambulatory Visit: Payer: Self-pay | Admitting: Cardiology

## 2024-01-11 LAB — TSH: TSH: 1.48 u[IU]/mL (ref 0.450–4.500)

## 2024-01-11 LAB — COMPREHENSIVE METABOLIC PANEL WITH GFR
ALT: 30 IU/L (ref 0–44)
AST: 23 IU/L (ref 0–40)
Albumin: 3.8 g/dL (ref 3.7–4.7)
Alkaline Phosphatase: 78 IU/L (ref 44–121)
BUN/Creatinine Ratio: 19 (ref 10–24)
BUN: 23 mg/dL (ref 8–27)
Bilirubin Total: 1 mg/dL (ref 0.0–1.2)
CO2: 20 mmol/L (ref 20–29)
Calcium: 8.8 mg/dL (ref 8.6–10.2)
Chloride: 102 mmol/L (ref 96–106)
Creatinine, Ser: 1.2 mg/dL (ref 0.76–1.27)
Globulin, Total: 2.7 g/dL (ref 1.5–4.5)
Glucose: 84 mg/dL (ref 70–99)
Potassium: 4.5 mmol/L (ref 3.5–5.2)
Sodium: 137 mmol/L (ref 134–144)
Total Protein: 6.5 g/dL (ref 6.0–8.5)
eGFR: 58 mL/min/{1.73_m2} — ABNORMAL LOW (ref >59)

## 2024-01-11 LAB — CBC
Hematocrit: 44 % (ref 37.5–51.0)
Hemoglobin: 14.6 g/dL (ref 13.0–17.7)
MCH: 33.2 pg — ABNORMAL HIGH (ref 26.6–33.0)
MCHC: 33.2 g/dL (ref 31.5–35.7)
MCV: 100 fL — ABNORMAL HIGH (ref 79–97)
Platelets: 243 10*3/uL (ref 150–450)
RBC: 4.4 x10E6/uL (ref 4.14–5.80)
RDW: 14 % (ref 11.6–15.4)
WBC: 7.7 10*3/uL (ref 3.4–10.8)

## 2024-01-11 LAB — LIPID PANEL
Chol/HDL Ratio: 1.8 ratio (ref 0.0–5.0)
Cholesterol, Total: 155 mg/dL (ref 100–199)
HDL: 88 mg/dL (ref >39)
LDL Chol Calc (NIH): 53 mg/dL (ref 0–99)
Triglycerides: 70 mg/dL (ref 0–149)
VLDL Cholesterol Cal: 14 mg/dL (ref 5–40)

## 2024-01-28 DIAGNOSIS — Z961 Presence of intraocular lens: Secondary | ICD-10-CM | POA: Diagnosis not present

## 2024-02-27 DIAGNOSIS — C44319 Basal cell carcinoma of skin of other parts of face: Secondary | ICD-10-CM | POA: Diagnosis not present

## 2024-03-04 ENCOUNTER — Other Ambulatory Visit: Payer: Self-pay

## 2024-03-04 MED ORDER — APIXABAN 5 MG PO TABS: 5.0000 mg | ORAL_TABLET | Freq: Two times a day (BID) | ORAL | 1 refills | Status: DC

## 2024-03-04 NOTE — Telephone Encounter (Signed)
 Prescription refill request for Eliquis  received. Indication:afib Last office visit:5/25 Scr:1.20  5/25 Age: 89 Weight:83.5  kg  Prescription refilled

## 2024-03-05 ENCOUNTER — Other Ambulatory Visit: Payer: Self-pay

## 2024-03-05 ENCOUNTER — Ambulatory Visit: Attending: Internal Medicine | Admitting: Physical Therapy

## 2024-03-05 ENCOUNTER — Encounter: Payer: Self-pay | Admitting: Physical Therapy

## 2024-03-05 DIAGNOSIS — M6281 Muscle weakness (generalized): Secondary | ICD-10-CM | POA: Insufficient documentation

## 2024-03-05 DIAGNOSIS — M5459 Other low back pain: Secondary | ICD-10-CM | POA: Insufficient documentation

## 2024-03-05 DIAGNOSIS — R293 Abnormal posture: Secondary | ICD-10-CM | POA: Diagnosis not present

## 2024-03-05 DIAGNOSIS — M25652 Stiffness of left hip, not elsewhere classified: Secondary | ICD-10-CM | POA: Insufficient documentation

## 2024-03-05 DIAGNOSIS — R2689 Other abnormalities of gait and mobility: Secondary | ICD-10-CM | POA: Diagnosis not present

## 2024-03-05 NOTE — Therapy (Signed)
 OUTPATIENT PHYSICAL THERAPY NEURO EVALUATION   Patient Name: Daniel Yu MRN: [id number] DOB:1931-12-31, 89 y.o., male Today's Date: 03/05/2024   PCP: Charlott Dorn LABOR, MD REFERRING PROVIDER: Charlott Dorn LABOR, MD  END OF SESSION:  PT End of Session - 03/05/24 0926     Visit Number 1    Number of Visits 16    Date for PT Re-Evaluation 04/30/24    Authorization Type [employer]    PT Start Time 0930    PT Stop Time 1010    PT Time Calculation (min) 40 min    Equipment Utilized During Treatment --    Activity Tolerance Patient tolerated treatment well    Behavior During Therapy WFL for tasks assessed/performed          Past Medical History:  Diagnosis Date   Arthritis    right knee oa   Asthma    Carpal tunnel syndrome    BILATERAL   Cholecystitis    Complication of anesthesia    adverse reaction to the spinal   Hypertension    PAF (paroxysmal atrial fibrillation) (HCC)    SBO (small bowel obstruction) (HCC) 12/2015   Past Surgical History:  Procedure Laterality Date   CARDIOVERSION N/A 02/14/2017   Procedure: CARDIOVERSION;  Surgeon: Delford Maude BROCKS, MD;  Location: [facility] ENDOSCOPY;  Service: Cardiovascular;  Laterality: N/A;   CARDIOVERSION N/A 12/11/2019   Procedure: CARDIOVERSION;  Surgeon: Pietro Redell RAMAN, MD;  Location: [facility] ENDOSCOPY;  Service: Cardiovascular;  Laterality: N/A;   CARDIOVERSION N/A 02/13/2022   Procedure: CARDIOVERSION;  Surgeon: Pietro Redell RAMAN, MD;  Location: [facility] ENDOSCOPY;  Service: Cardiovascular;  Laterality: N/A;   CHOLECYSTECTOMY  02/09/2017   CHOLECYSTECTOMY N/A 02/09/2017   Procedure: LAPAROSCOPIC CHOLECYSTECTOMY WITH INTRAOPERATIVE CHOLANGIOGRAM;  Surgeon: Belinda Cough, MD;  Location: [facility] OR;  Service: General;  Laterality: N/A;   COLONOSCOPY WITH PROPOFOL  N/A 11/09/2014   Procedure: COLONOSCOPY WITH PROPOFOL ;  Surgeon: Gladis MARLA Louder, MD;  Location: [facility] ENDOSCOPY;  Service: Endoscopy;  Laterality: N/A;   HERNIA REPAIR     2013    LEFT HEART CATH AND CORONARY ANGIOGRAPHY N/A 05/23/2022   Procedure: LEFT HEART CATH AND CORONARY ANGIOGRAPHY;  Surgeon: Swazil[place], Peter M, MD;  Location: [facility] INVASIVE CV LAB;  Service: Cardiovascular;  Laterality: N/A;   QUADRICEPS TENDON REPAIR Left 06/07/2015   Procedure: REPAIR LEFT QUADRICEP TENDON;  Surgeon: Dempsey Moan, MD;  Location: [facility] ORS;  Service: Orthopedics;  Laterality: Left;   TEE WITHOUT CARDIOVERSION N/A 02/14/2017   Procedure: TRANSESOPHAGEAL ECHOCARDIOGRAM (TEE);  Surgeon: Delford Maude BROCKS, MD;  Location: [facility] ENDOSCOPY;  Service: Cardiovascular;  Laterality: N/A;   TOTAL KNEE ARTHROPLASTY Right 12/27/2015   Procedure: RIGHT TOTAL KNEE ARTHROPLASTY;  Surgeon: Dempsey Moan, MD;  Location: [facility] ORS;  Service: Orthopedics;  Laterality: Right;   Patient Active Problem List   Diagnosis Date Noted   Orthostatic hypotension 08/13/2023   Syncope and collapse 08/12/2023   Chronic diastolic CHF (congestive heart failure) (HCC) 08/12/2023   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 coli) (HCC)  Acute systolic CHF (congestive heart failure) (HCC)    Paroxysmal atrial fibrillation (HCC)    Bacteremia due to Escherichia coli    Hypokalemia    Sepsis (HCC) 02/08/2017   Abnormal abdominal CT scan 02/08/2017   Asthma    Arthritis    SBO (small bowel obstruction) (HCC) 01/03/2016   Dehydration with hyponatremia 01/03/2016   S/P right knee surgery/TKA  01/03/2016   Acute hypokalemia 01/03/2016   Essential hypertension 01/03/2016   Acute hyperglycemia  01/03/2016   OA (osteoarthritis) of knee 12/27/2015   Rupture of left quadriceps tendon 06/07/2015   Quadriceps tendon rupture 06/07/2015    ONSET DATE: 2016  REFERRING DIAG: R29.6 (ICD-10-CM) - Repeated falls M54.50 (ICD-10-CM) - Low back pain, unspecified  THERAPY DIAG:  Other abnormalities of gait and mobility  Abnormal posture  Muscle weakness (generalized)  Other low back pain  Stiffness of left hip, not elsewhere classified  Rationale for Evaluation and Treatment: Rehabilitation  SUBJECTIVE:                                                                                                                                                                                             SUBJECTIVE STATEMENT: Pt states he fell on steps [place] [employer] in 2016 and could not get up. Went to hospital the next day and didn't see any breaks on x-ray but was fitted with a brace to secure his L leg and was recommended to see ortho. Saw Dr. Hiram who drained fluid from his L knee. Found that he severed 3 out of 4 quadriceps muscles and got a quad repair in 2016. Now, pt states he can feel it go up his L leg to his L low back which has impeded his walking. Can feel it when getting out of the tub/shower. Goes to [employer] and works out 3 days a week and rides bike.  Pt accompanied by: self  PERTINENT HISTORY: [name], L quad repair 2016  PAIN:  Are you having pain? Yes: NPRS scale: 0 at rest, at worst 4 or 5 Pain location: L leg to low back Pain description: Stiffness Aggravating factors: Walking, getting in/out of shower using grab bars Relieving factors: nothing currently  PRECAUTIONS: Fall  RED FLAGS: None   WEIGHT BEARING RESTRICTIONS: No  FALLS: Has patient fallen in last 6 months? Yes. Number of falls 2 fall. 1 in Aug 11, 2023 -- BP dropped; another in May 2025 coming in the door and fell backwards on stoop  LIVING ENVIRONMENT: Lives with: lives with their  spouse Lives in: House/apartment Stairs: Yes: Internal: 12 steps; bilateral  but cannot reach both and External: 1 stoop steps; none Has following equipment at home: None  PLOF: Independent; likes to read, goes to [employer]  PATIENT GOALS: Improve L LE tightness/pain and walking  OBJECTIVE:  Note: Objective measures were completed at Evaluation unless otherwise noted.  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  COGNITION: Overall cognitive status: Within functional limits for tasks assessed   SENSATION: WFL  COORDINATION: N/T  EDEMA:  None  PALPATION: Tenderness and tightness notable in L lumbar paraspinal, QL, slightly in L glute max  MUSCLE LENGTH: Hamstrings: Right 75 deg; Left 62 deg SLR Thomas test: Did not assess Quads in prone: Right 97 deg; Left 95 deg FABER: L much tighter than R  DTRs:  N/T  POSTURE: L lateral hip shift, trunk flexed at pelvis with pt in anterior pelvic tilt  LOWER EXTREMITY ROM:     Passive  Right Eval Left Eval  Hip flexion    Hip extension    Hip abduction    Hip adduction    Hip internal rotation 25 pulls on lower L side 35  Hip external rotation 35 30  Knee flexion    Knee extension    Ankle dorsiflexion    Ankle plantarflexion    Ankle inversion    Ankle eversion     (Blank rows = not tested)  LOWER EXTREMITY MMT:    MMT Right Eval Left Eval  Hip flexion 3+ 3+  Hip extension 2+ 2+  Hip abduction 3 3  Hip adduction    Hip internal rotation    Hip external rotation    Knee flexion 5 3+  Knee extension 5 4-  Ankle dorsiflexion    Ankle plantarflexion    Ankle inversion    Ankle eversion    (Blank rows = not tested)  BED MOBILITY:  Independent  TRANSFERS: Sit to stand: Complete Independence  Assistive device utilized: None     Stand to sit: Complete Independence  Assistive device utilized: None      RAMP:  Not  tested  CURB:  Not tested  STAIRS: Not tested GAIT: Findings: Distance walked: Into clinic and Comments: Normal reciprocal pattern  FUNCTIONAL TESTS:  5x STS: 15.47 sec  [facility] PT Assessment - 03/05/24 0001       Standardized Balance Assessment   Standardized Balance Assessment Berg Balance Test      Berg Balance Test   Sit to Stand Able to stand without using hands and stabilize independently    Standing Unsupported Able to stand safely 2 minutes    Sitting with Back Unsupported but Feet Supported on Floor or Stool Able to sit safely and securely 2 minutes    Stand to Sit Sits safely with minimal use of hands    Transfers Able to transfer safely, definite need of hands    Standing Unsupported with Eyes Closed Able to stand 10 seconds with supervision    Standing Unsupported with Feet Together Able to place feet together independently and stand 1 minute safely    From Standing, Reach Forward with Outstretched Arm Can reach forward >12 cm safely (5)    From Standing Position, Pick up Object from Floor Able to pick up shoe safely and easily    From Standing Position, Turn to Look Behind Over each Shoulder Looks behind one side only/other side shows less weight shift   Stiffer to the right   Turn 360 Degrees Able  to turn 360 degrees safely but slowly    Standing Unsupported, Alternately Place Feet on Step/Stool Able to stand independently and complete 8 steps >20 seconds   minor LOB with lifting L LE up   Standing Unsupported, One Foot in Front Able to take small step independently and hold 30 seconds    Standing on One Leg Tries to lift leg/unable to hold 3 seconds but remains standing independently    Total Score 44          PATIENT SURVEYS:  PSFS: THE PATIENT SPECIFIC FUNCTIONAL SCALE  Place score of 0-10 (0 = unable to perform activity and 10 = able to perform activity at the same level as before injury or problem)  Activity Date: 03/05/24    Walking 5    2.       3.      4.      Total Score 5      Total Score = Sum of activity scores/number of activities  Minimally Detectable Change: 3 points (for single activity); 2 points (for average score)  [facility] (nd). The Patient Specific Functional Scale . Retrieved from [email or web address]                                                                                                                               TREATMENT DATE: 03/05/24 Trial of HEP provided below. Not able to perform seated hip stretches on L due to hip tightness but encouraged for pt to try on R   PATIENT EDUCATION: Education details: Exam findings, POC, initial HEP Person educated: Patient Education method: Explanation, Demonstration, and Handouts Education comprehension: verbalized understanding, returned demonstration, and needs further education  HOME EXERCISE PROGRAM: Access Code: [email or web address] URL: [email or web address] Date: 03/05/2024 Prepared by: Bambie Pizzolato April Marie Shye Doty  Exercises - Seated Hamstring Stretch  - 1 x daily - 7 x weekly - 2 sets - 30 sec hold - Seated Figure 4 Piriformis Stretch  - 1 x daily - 7 x weekly - 2 sets - 30 sec hold - Seated Piriformis Stretch  - 1 x daily - 7 x weekly - 2 sets - 30 sec hold - Standing Lumbar Extension  - 1 x daily - 7 x weekly - 1 sets - 10 reps - Seated Lumbar Flexion Stretch  - 1 x daily - 7 x weekly - 2 sets - 30 sec hold - Seated Trunk Rotation Stretch  - 1 x daily - 7 x weekly - 2 sets - 30 sec hold  GOALS: Goals reviewed with patient? Yes  SHORT TERM GOALS: Target date: 04/02/2024   Pt will be ind with initial HEP Baseline: Goal status: INITIAL  2.  Pt will be able to cross L LE over R to demo increasing hip flexibility Baseline: Unable on eval Goal status: INITIAL  3.  Pt will report improvement of tightness by >/=50% Baseline:  Goal status: INITIAL   LONG TERM  GOALS: Target date:  04/30/2024   Pt will be ind with management and progression of HEP Baseline:  Goal status: INITIAL  2.  Pt will have improved Berg Balance Score to >/=52/56 to demo MCID and decrease fall risk Baseline: 44/56 Goal status: INITIAL  3.  Pt will have improved 5x STS to </=13 sec to demo increased functional LE strength and decrease fall risk Baseline: 15.47 sec Goal status: INITIAL  4.  Pt will have improved PSFS score average to >/=7 Baseline: 5 Goal status: INITIAL  5.  Pt will demo L = R hip/LE mobility Baseline:  Goal status: INITIAL   ASSESSMENT:  CLINICAL IMPRESSION: Patient is a 89 y.o. M who was seen today for physical therapy evaluation and treatment for L LE/low back tightness and falls. PMH significant for L quad repair and [name]. Assessment demos pt at a high fall risk based on his Berg Balance, L>R LE weakness and hip/low back tightness affecting his ability to tolerate prolonged walking and safety with stepping over obstacles and stair negotiation. Pt will greatly benefit from PT to improve on these issues.   OBJECTIVE IMPAIRMENTS: Abnormal gait, decreased activity tolerance, decreased balance, decreased coordination, decreased endurance, decreased mobility, difficulty walking, decreased ROM, decreased strength, hypomobility, increased fascial restrictions, increased muscle spasms, impaired flexibility, improper body mechanics, postural dysfunction, and pain.   ACTIVITY LIMITATIONS: standing, stairs, transfers, bathing, and locomotion level  PARTICIPATION LIMITATIONS: meal prep, cleaning, shopping, and community activity  PERSONAL FACTORS: Age, Fitness, Past/current experiences, and Time since onset of injury/illness/exacerbation are also affecting patient's functional outcome.   REHAB POTENTIAL: Good  CLINICAL DECISION MAKING: Evolving/moderate complexity  EVALUATION COMPLEXITY: Moderate  PLAN:  PT FREQUENCY: 2x/week  PT DURATION: 8 weeks  PLANNED  INTERVENTIONS: 97164- PT Re-evaluation, 97750- Physical Performance Testing, 97110-Therapeutic exercises, 97530- Therapeutic activity, [id number]- Neuromuscular re-education, 97535- Self Care, [id number]- Manual therapy, [id number]- Gait training, [phone]- Aquatic Therapy, [phone]- Electrical stimulation (unattended), [phone]- Traction (mechanical), [id number]- Ionotophoresis 4mg /ml Dexamethasone , 79439 (1-2 muscles), [id number] (3+ muscles)- Dry Needling, Patient/Family education, Balance training, Stair training, Taping, Joint mobilization, Spinal mobilization, Cryotherapy, and Moist heat  PLAN FOR NEXT SESSION: Assess response to HEP. Work on Chiropractor. Core and hip strengthening. Balance.    Stryker Veasey April Ma L Tommye Lehenbauer, PT 03/05/2024, 2:15 PM

## 2024-03-07 ENCOUNTER — Ambulatory Visit

## 2024-03-07 DIAGNOSIS — M25652 Stiffness of left hip, not elsewhere classified: Secondary | ICD-10-CM | POA: Diagnosis not present

## 2024-03-07 DIAGNOSIS — R2689 Other abnormalities of gait and mobility: Secondary | ICD-10-CM | POA: Diagnosis not present

## 2024-03-07 DIAGNOSIS — R293 Abnormal posture: Secondary | ICD-10-CM | POA: Diagnosis not present

## 2024-03-07 DIAGNOSIS — M5459 Other low back pain: Secondary | ICD-10-CM

## 2024-03-07 DIAGNOSIS — M6281 Muscle weakness (generalized): Secondary | ICD-10-CM

## 2024-03-07 NOTE — Therapy (Signed)
 OUTPATIENT PHYSICAL THERAPY NEURO TREATMENT   Patient Name: Daniel Yu MRN: [id number] DOB:1932/02/28, 89 y.o., male Today's Date: 03/07/2024   PCP: Charlott Dorn LABOR, MD REFERRING PROVIDER: Charlott Dorn LABOR, MD  END OF SESSION:  PT End of Session - 03/07/24 0924     Visit Number 2    Number of Visits 16    Date for PT Re-Evaluation 04/30/24    Authorization Type [employer]    PT Start Time 0930    PT Stop Time 1015    PT Time Calculation (min) 45 min    Activity Tolerance Patient tolerated treatment well    Behavior During Therapy WFL for tasks assessed/performed          Past Medical History:  Diagnosis Date   Arthritis    right knee oa   Asthma    Carpal tunnel syndrome    BILATERAL   Cholecystitis    Complication of anesthesia    adverse reaction to the spinal   Hypertension    PAF (paroxysmal atrial fibrillation) (HCC)    SBO (small bowel obstruction) (HCC) 12/2015   Past Surgical History:  Procedure Laterality Date   CARDIOVERSION N/A 02/14/2017   Procedure: CARDIOVERSION;  Surgeon: Delford Maude BROCKS, MD;  Location: [facility] ENDOSCOPY;  Service: Cardiovascular;  Laterality: N/A;   CARDIOVERSION N/A 12/11/2019   Procedure: CARDIOVERSION;  Surgeon: Pietro Redell RAMAN, MD;  Location: [facility] ENDOSCOPY;  Service: Cardiovascular;  Laterality: N/A;   CARDIOVERSION N/A 02/13/2022   Procedure: CARDIOVERSION;  Surgeon: Pietro Redell RAMAN, MD;  Location: [facility] ENDOSCOPY;  Service: Cardiovascular;  Laterality: N/A;   CHOLECYSTECTOMY  02/09/2017   CHOLECYSTECTOMY N/A 02/09/2017   Procedure: LAPAROSCOPIC CHOLECYSTECTOMY WITH INTRAOPERATIVE CHOLANGIOGRAM;  Surgeon: Belinda Cough, MD;  Location: [facility] OR;  Service: General;  Laterality: N/A;   COLONOSCOPY WITH PROPOFOL  N/A 11/09/2014   Procedure: COLONOSCOPY WITH PROPOFOL ;  Surgeon: Gladis MARLA Louder, MD;  Location: [facility] ENDOSCOPY;  Service: Endoscopy;  Laterality: N/A;   HERNIA REPAIR     2013   LEFT HEART CATH AND CORONARY ANGIOGRAPHY N/A  05/23/2022   Procedure: LEFT HEART CATH AND CORONARY ANGIOGRAPHY;  Surgeon: [place], Peter M, MD;  Location: [facility] INVASIVE CV LAB;  Service: Cardiovascular;  Laterality: N/A;   QUADRICEPS TENDON REPAIR Left 06/07/2015   Procedure: REPAIR LEFT QUADRICEP TENDON;  Surgeon: Dempsey Moan, MD;  Location: [facility] ORS;  Service: Orthopedics;  Laterality: Left;   TEE WITHOUT CARDIOVERSION N/A 02/14/2017   Procedure: TRANSESOPHAGEAL ECHOCARDIOGRAM (TEE);  Surgeon: Delford Maude BROCKS, MD;  Location: [facility] ENDOSCOPY;  Service: Cardiovascular;  Laterality: N/A;   TOTAL KNEE ARTHROPLASTY Right 12/27/2015   Procedure: RIGHT TOTAL KNEE ARTHROPLASTY;  Surgeon: Dempsey Moan, MD;  Location: [facility] ORS;  Service: Orthopedics;  Laterality: Right;   Patient Active Problem List   Diagnosis Date Noted   Orthostatic hypotension 08/13/2023   Syncope and collapse 08/12/2023   Chronic diastolic CHF (congestive heart failure) (HCC) 08/12/2023   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 coli) (HCC)    Acute systolic CHF (congestive heart  failure) (HCC)    Paroxysmal atrial fibrillation (HCC)    Bacteremia due to Escherichia coli    Hypokalemia    Sepsis (HCC) 02/08/2017   Abnormal abdominal CT scan 02/08/2017   Asthma    Arthritis    SBO (small bowel obstruction) (HCC) 01/03/2016   Dehydration with hyponatremia 01/03/2016   S/P right knee surgery/TKA  01/03/2016   Acute hypokalemia 01/03/2016   Essential hypertension 01/03/2016   Acute hyperglycemia 01/03/2016   OA (osteoarthritis) of knee 12/27/2015    Rupture of left quadriceps tendon 06/07/2015   Quadriceps tendon rupture 06/07/2015    ONSET DATE: 2016  REFERRING DIAG: R29.6 (ICD-10-CM) - Repeated falls M54.50 (ICD-10-CM) - Low back pain, unspecified  THERAPY DIAG:  Other abnormalities of gait and mobility  Abnormal posture  Muscle weakness (generalized)  Other low back pain  Stiffness of left hip, not elsewhere classified  Rationale for Evaluation and Treatment: Rehabilitation  SUBJECTIVE:                                                                                                                                                                                             SUBJECTIVE STATEMENT: Stiff in the left buttocks area, denies any radiating pain with prolonged standing/walking. Lifting the leg to enter/exit tub causes left buttocks pain Pt accompanied by: self  PERTINENT HISTORY: [name], L quad repair 2016  PAIN:  Are you having pain? Yes: NPRS scale: 0 at rest, at worst 4 or 5 Pain location: L leg to low back Pain description: Stiffness Aggravating factors: Walking, getting in/out of shower using grab bars Relieving factors: nothing currently  PRECAUTIONS: Fall  RED FLAGS: None   WEIGHT BEARING RESTRICTIONS: No  FALLS: Has patient fallen in last 6 months? Yes. Number of falls 2 fall. 1 in Aug 11, 2023 -- BP dropped; another in May 2025 coming in the door and fell backwards on stoop  LIVING ENVIRONMENT: Lives with: lives with their spouse Lives in: House/apartment Stairs: Yes: Internal: 12 steps; bilateral but cannot reach both and External: 1 stoop steps; none Has following equipment at home: None  PLOF: Independent; likes to read, goes to [employer]  PATIENT GOALS: Improve L LE tightness/pain and walking  OBJECTIVE:   TODAY'S TREATMENT: 03/07/24 Activity Comments  NU-step intervals x 8 min 2 min warm-up 30 sec speed; 60 sec recovery pace -notes stiffness in low back and left buttocks during  movement--no pain  Manual therapy Soft tissue mobilization left piriformis and quadratus femoris. Long axis distraction, hip abduction/inferior glide left hip and grade 2 oscillations to reduce guarding/pain  HEP Unable to perform seated piriformis,  modified to hooklying  Sidestepping x 2 min Green loop round knees at counter          TREATMENT DATE: 03/05/24 Trial of HEP provided below. Not able to perform seated hip stretches on L due to hip tightness but encouraged for pt to try on R   PATIENT EDUCATION: Education details: Exam findings, POC, initial HEP Person educated: Patient Education method: Explanation, Demonstration, and Handouts Education comprehension: verbalized understanding, returned demonstration, and needs further education  HOME EXERCISE PROGRAM: Access Code: [email or web address] URL: [email or web address] Date: 03/05/2024 Prepared by: Gellen April Marie Nonato  Exercises - Seated Hamstring Stretch  - 1 x daily - 7 x weekly - 2 sets - 30 sec hold - Standing Lumbar Extension  - 1 x daily - 7 x weekly - 1 sets - 10 reps - Seated Lumbar Flexion Stretch  - 1 x daily - 7 x weekly - 2 sets - 30 sec hold - Seated Trunk Rotation Stretch  - 1 x daily - 7 x weekly - 2 sets - 30 sec hold - Supine Figure 4 Piriformis Stretch  - 1 x daily - 7 x weekly - 3 sets - 30 sec hold - Side Stepping with Resistance at Thighs and Counter Support  - 2-3 x weekly - 1-3 sets - 60 sec rounds hold  Note: Objective measures were completed at Evaluation unless otherwise noted.  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  COGNITION: Overall cognitive status: Within functional limits for tasks assessed   SENSATION: WFL  COORDINATION: N/T  EDEMA:  None  PALPATION: Tenderness and tightness notable in L lumbar paraspinal, QL, slightly in L glute max  MUSCLE LENGTH: Hamstrings: Right 75 deg; Left  62 deg SLR Thomas test: Did not assess Quads in prone: Right 97 deg; Left 95 deg FABER: L much tighter than R  DTRs:  N/T  POSTURE: L lateral hip shift, trunk flexed at pelvis with pt in anterior pelvic tilt  LOWER EXTREMITY ROM:     Passive  Right Eval Left Eval  Hip flexion    Hip extension    Hip abduction    Hip adduction    Hip internal rotation 25 pulls on lower L side 35  Hip external rotation 35 30  Knee flexion    Knee extension    Ankle dorsiflexion    Ankle plantarflexion    Ankle inversion    Ankle eversion     (Blank rows = not tested)  LOWER EXTREMITY MMT:    MMT Right Eval Left Eval  Hip flexion 3+ 3+  Hip extension 2+ 2+  Hip abduction 3 3  Hip adduction    Hip internal rotation    Hip external rotation    Knee flexion 5 3+  Knee extension 5 4-  Ankle dorsiflexion    Ankle plantarflexion    Ankle inversion    Ankle eversion    (Blank rows = not tested)  BED MOBILITY:  Independent  TRANSFERS: Sit to stand: Complete Independence  Assistive device utilized: None     Stand to sit: Complete Independence  Assistive device utilized: None      RAMP:  Not tested  CURB:  Not tested  STAIRS: Not tested GAIT: Findings: Distance walked: Into clinic and Comments: Normal reciprocal pattern  FUNCTIONAL TESTS:  5x STS: 15.47 sec    PATIENT SURVEYS:  PSFS: THE PATIENT SPECIFIC FUNCTIONAL SCALE  Place score of 0-10 (0 =  unable to perform activity and 10 = able to perform activity at the same level as before injury or problem)  Activity Date: 03/05/24    Walking 5    2.       3.     4.      Total Score 5      Total Score = Sum of activity scores/number of activities  Minimally Detectable Change: 3 points (for single activity); 2 points (for average score)  [facility] (nd). The Patient Specific Functional Scale . Retrieved from [email or web address]                                                                                                                                  GOALS: Goals reviewed with patient? Yes  SHORT TERM GOALS: Target date: 04/02/2024   Pt will be ind with initial HEP Baseline: Goal status: INITIAL  2.  Pt will be able to cross L LE over R to demo increasing hip flexibility Baseline: Unable on eval Goal status: INITIAL  3.  Pt will report improvement of tightness by >/=50% Baseline:  Goal status: INITIAL   LONG TERM GOALS: Target date: 04/30/2024   Pt will be ind with management and progression of HEP Baseline:  Goal status: INITIAL  2.  Pt will have improved Berg Balance Score to >/=52/56 to demo MCID and decrease fall risk Baseline: 44/56 Goal status: INITIAL  3.  Pt will have improved 5x STS to </=13 sec to demo increased functional LE strength and decrease fall risk Baseline: 15.47 sec Goal status: INITIAL  4.  Pt will have improved PSFS score average to >/=7 Baseline: 5 Goal status: INITIAL  5.  Pt will demo L = R hip/LE mobility Baseline:  Goal status: INITIAL   ASSESSMENT:  CLINICAL IMPRESSION: Notes increased stiffness and discomfort to left buttock and low back with flexion-bias. No pain provocation to Scour's of left hip or with PROM to internal rotation other than tightness to lateral hip. NU-step for active warm-up to prepare for mobility/flexibility training and review to HEP with good tolerance and recall, but unable to perform seated cross-leg positioning due to ROM limit of left hip and modified to hooklying for piriformis to good tolerance.  Manual therapy to address soft tissue restrictions to left piriformis and quadratus femoris. Addition of sidestepping with resistance to improve strength of left hip abd and external rotation to improve muscular endurance/efficiency. Continued sessions to progress POC details  OBJECTIVE IMPAIRMENTS: Abnormal gait, decreased activity tolerance, decreased  balance, decreased coordination, decreased endurance, decreased mobility, difficulty walking, decreased ROM, decreased strength, hypomobility, increased fascial restrictions, increased muscle spasms, impaired flexibility, improper body mechanics, postural dysfunction, and pain.   ACTIVITY LIMITATIONS: standing, stairs, transfers, bathing, and locomotion level  PARTICIPATION LIMITATIONS: meal prep, cleaning, shopping, and community activity  PERSONAL FACTORS: Age, Fitness, Past/current experiences, and Time since onset of injury/illness/exacerbation are also affecting patient's functional outcome.  REHAB POTENTIAL: Good  CLINICAL DECISION MAKING: Evolving/moderate complexity  EVALUATION COMPLEXITY: Moderate  PLAN:  PT FREQUENCY: 2x/week  PT DURATION: 8 weeks  PLANNED INTERVENTIONS: 97164- PT Re-evaluation, 97750- Physical Performance Testing, 97110-Therapeutic exercises, 97530- Therapeutic activity, [id number]- Neuromuscular re-education, 97535- Self Care, [id number]- Manual therapy, [id number]- Gait training, [phone]- Aquatic Therapy, [phone]- Electrical stimulation (unattended), [phone]- Traction (mechanical), [id number]- Ionotophoresis 4mg /ml Dexamethasone , 79439 (1-2 muscles), [id number] (3+ muscles)- Dry Needling, Patient/Family education, Balance training, Stair training, Taping, Joint mobilization, Spinal mobilization, Cryotherapy, and Moist heat  PLAN FOR NEXT SESSION: Assess response to HEP. Work on Chiropractor. Core and hip strengthening. Balance.    11:05 AM, 03/07/24 M. Kelly Youssouf Shipley, PT, DPT Physical Therapist- [place] Office Number: [phone]

## 2024-03-12 ENCOUNTER — Ambulatory Visit: Admitting: Physical Therapy

## 2024-03-12 ENCOUNTER — Encounter: Payer: Self-pay | Admitting: Physical Therapy

## 2024-03-12 DIAGNOSIS — M6281 Muscle weakness (generalized): Secondary | ICD-10-CM | POA: Diagnosis not present

## 2024-03-12 DIAGNOSIS — M25652 Stiffness of left hip, not elsewhere classified: Secondary | ICD-10-CM

## 2024-03-12 DIAGNOSIS — R2689 Other abnormalities of gait and mobility: Secondary | ICD-10-CM

## 2024-03-12 DIAGNOSIS — R293 Abnormal posture: Secondary | ICD-10-CM

## 2024-03-12 DIAGNOSIS — M5459 Other low back pain: Secondary | ICD-10-CM | POA: Diagnosis not present

## 2024-03-12 NOTE — Therapy (Signed)
 OUTPATIENT PHYSICAL THERAPY NEURO TREATMENT   Patient Name: Daniel Yu MRN: [id number] DOB:1932/05/20, 89 y.o., male Today's Date: 03/12/2024   PCP: Charlott Dorn LABOR, MD REFERRING PROVIDER: Charlott Dorn LABOR, MD  END OF SESSION:  PT End of Session - 03/12/24 0837     Visit Number 3    Number of Visits 16    Date for PT Re-Evaluation 04/30/24    Authorization Type [employer]    Authorization Time Period approved 16 PT visits from 03/05/2024 - 04/30/2024    Authorization - Visit Number 3    Authorization - Number of Visits 16    Progress Note Due on Visit 10    PT Start Time 0838    PT Stop Time 0920    PT Time Calculation (min) 42 min    Activity Tolerance Patient tolerated treatment well    Behavior During Therapy [facility] for tasks assessed/performed           Past Medical History:  Diagnosis Date   Arthritis    right knee oa   Asthma    Carpal tunnel syndrome    BILATERAL   Cholecystitis    Complication of anesthesia    adverse reaction to the spinal   Hypertension    PAF (paroxysmal atrial fibrillation) (HCC)    SBO (small bowel obstruction) (HCC) 12/2015   Past Surgical History:  Procedure Laterality Date   CARDIOVERSION N/A 02/14/2017   Procedure: CARDIOVERSION;  Surgeon: Delford Maude BROCKS, MD;  Location: [facility] ENDOSCOPY;  Service: Cardiovascular;  Laterality: N/A;   CARDIOVERSION N/A 12/11/2019   Procedure: CARDIOVERSION;  Surgeon: Pietro Redell RAMAN, MD;  Location: [facility] ENDOSCOPY;  Service: Cardiovascular;  Laterality: N/A;   CARDIOVERSION N/A 02/13/2022   Procedure: CARDIOVERSION;  Surgeon: Pietro Redell RAMAN, MD;  Location: [facility] ENDOSCOPY;  Service: Cardiovascular;  Laterality: N/A;   CHOLECYSTECTOMY  02/09/2017   CHOLECYSTECTOMY N/A 02/09/2017   Procedure: LAPAROSCOPIC CHOLECYSTECTOMY WITH INTRAOPERATIVE CHOLANGIOGRAM;  Surgeon: Belinda Cough, MD;  Location: [facility] OR;  Service: General;  Laterality: N/A;   COLONOSCOPY WITH PROPOFOL  N/A 11/09/2014   Procedure:  COLONOSCOPY WITH PROPOFOL ;  Surgeon: Gladis MARLA Louder, MD;  Location: [facility] ENDOSCOPY;  Service: Endoscopy;  Laterality: N/A;   HERNIA REPAIR     2013   LEFT HEART CATH AND CORONARY ANGIOGRAPHY N/A 05/23/2022   Procedure: LEFT HEART CATH AND CORONARY ANGIOGRAPHY;  Surgeon: [place], Peter M, MD;  Location: [facility] INVASIVE CV LAB;  Service: Cardiovascular;  Laterality: N/A;   QUADRICEPS TENDON REPAIR Left 06/07/2015   Procedure: REPAIR LEFT QUADRICEP TENDON;  Surgeon: Dempsey Moan, MD;  Location: [facility] ORS;  Service: Orthopedics;  Laterality: Left;   TEE WITHOUT CARDIOVERSION N/A 02/14/2017   Procedure: TRANSESOPHAGEAL ECHOCARDIOGRAM (TEE);  Surgeon: Delford Maude BROCKS, MD;  Location: [facility] ENDOSCOPY;  Service: Cardiovascular;  Laterality: N/A;   TOTAL KNEE ARTHROPLASTY Right 12/27/2015   Procedure: RIGHT TOTAL KNEE ARTHROPLASTY;  Surgeon: Dempsey Moan, MD;  Location: [facility] ORS;  Service: Orthopedics;  Laterality: Right;   Patient Active Problem List   Diagnosis Date Noted   Orthostatic hypotension 08/13/2023   Syncope and collapse 08/12/2023   Chronic diastolic CHF (congestive heart failure) (HCC) 08/12/2023   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 coli) (HCC)    Acute systolic CHF (congestive heart failure) (HCC)    Paroxysmal atrial fibrillation (HCC)    Bacteremia due to Escherichia coli    Hypokalemia    Sepsis (HCC) 02/08/2017   Abnormal abdominal CT scan 02/08/2017   Asthma    Arthritis    SBO (small bowel obstruction) (HCC) 01/03/2016   Dehydration with hyponatremia  01/03/2016   S/P right knee surgery/TKA  01/03/2016   Acute hypokalemia 01/03/2016   Essential hypertension 01/03/2016   Acute hyperglycemia 01/03/2016   OA (osteoarthritis) of knee 12/27/2015   Rupture of left quadriceps tendon 06/07/2015   Quadriceps tendon rupture 06/07/2015    ONSET DATE: 2016  REFERRING DIAG: R29.6 (ICD-10-CM) - Repeated falls M54.50 (ICD-10-CM) - Low back pain, unspecified  THERAPY DIAG:  No diagnosis found.  Rationale for Evaluation and Treatment: Rehabilitation  SUBJECTIVE:                                                                                                                                                                                             SUBJECTIVE STATEMENT: It's stiff. There's no magic pill. Has been doing his stretches along with his wife.  Pt accompanied by: self  PERTINENT HISTORY: [name], L quad repair 2016  PAIN:  Are you having pain? Yes: NPRS scale: 0 at rest, at worst 4 or 5 Pain location: L leg to low back Pain description: Stiffness Aggravating factors: Walking, getting in/out of shower using grab bars Relieving factors: nothing currently  PRECAUTIONS: Fall  RED FLAGS: None   WEIGHT BEARING RESTRICTIONS: No  FALLS: Has patient fallen in last 6 months? Yes. Number of falls 2 falls. 1 in Aug 11, 2023 -- BP dropped; another in May 2025 coming in the door and fell backwards on stoop  LIVING ENVIRONMENT: Lives with: lives with their spouse Lives in: House/apartment Stairs: Yes: Internal: 12 steps; bilateral but cannot reach both and External: 1 stoop steps; none Has following equipment at home: None  PLOF: Independent; likes to read, goes to [employer]  PATIENT GOALS: Improve L LE tightness/pain and walking  OBJECTIVE:   TODAY'S TREATMENT: 03/12/24 Activity Comments  NU-step L5 x 6 min  Using both UEs/LEs, seat 11  LTR 5x10 holds ITB stretch with strap 3x 30 L only DKTC x30 Piriformis stretch x 1  min Internal rotation stretch x 1 min Figure 4 stretch x1 min Performed in supine   Bridge with  ball squeeze Able to lift off ~1  Hip ER in figure 4 stretch Iso press into PT hand  TSA contraction with knees extended Cues to keep from holding his breath  TSA contraction + SLR x10   Seated figure 4 x30   Standing side step x 5 laps By counter, green TB around knees  Backwards walking x 5 laps By counter, green TB around knees. Cues to keep chest up and hips forward to keep from flexing at trunk     PATIENT EDUCATION: Education details: Exam findings, POC, initial HEP Person educated: Patient Education method: Explanation, Demonstration, and Handouts Education comprehension: verbalized understanding, returned demonstration, and needs further education  HOME EXERCISE PROGRAM: Access Code: [email or web address] URL: [email or web address] Date: 03/12/2024 Prepared by: Gerasimos Plotts April Earnie Starring  Exercises - Seated Hamstring Stretch  - 1 x daily - 7 x weekly - 2 sets - 30 sec hold - Standing Lumbar Extension  - 1 x daily - 7 x weekly - 1 sets - 10 reps - Supine Lower Trunk Rotation  - 1 x daily - 7 x weekly - 1 sets - 10 reps - 5 sec hold - Supine Double Knee to Chest  - 1 x daily - 7 x weekly - 2 sets - 30 sec hold - Supine Figure 4 Piriformis Stretch  - 1 x daily - 7 x weekly - 3 sets - 30 sec hold - Supine Anterior Posterior Pelvic Tilt  - 1 x daily - 7 x weekly - 1 sets - 10 reps - Side Stepping with Resistance at Thighs and Counter Support  - 2-3 x weekly - 1-3 sets - 60 sec rounds hold - Backward Monster Walk with Resistance at [employer] and Counter Support  - 1-3 x daily - 2-3 x weekly - 2 sets - 10 reps  Note: Objective measures were completed at Evaluation unless otherwise noted.  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  PALPATION: Tenderness and tightness notable in L lumbar  paraspinal, QL, slightly in L glute max  MUSCLE LENGTH: Hamstrings: Right 75 deg; Left 62 deg SLR Thomas test: Did not assess Quads in prone: Right 97 deg; Left 95 deg FABER: L much tighter than R   POSTURE: L lateral hip shift, trunk flexed at pelvis with pt in anterior pelvic tilt  LOWER EXTREMITY ROM:     Passive  Right Eval Left Eval  Hip flexion    Hip extension    Hip abduction    Hip adduction    Hip internal rotation 25 pulls on lower L side 35  Hip external rotation 35 30  Knee flexion    Knee extension    Ankle dorsiflexion    Ankle plantarflexion    Ankle inversion    Ankle eversion     (Blank rows = not tested)  LOWER EXTREMITY MMT:    MMT Right Eval Left Eval  Hip flexion 3+ 3+  Hip extension 2+ 2+  Hip abduction 3 3  Hip adduction    Hip internal rotation    Hip external rotation    Knee flexion 5 3+  Knee extension 5 4-  Ankle dorsiflexion    Ankle plantarflexion    Ankle inversion    Ankle eversion    (Blank rows = not tested)  GAIT: Findings: Distance walked: Into clinic and Comments: Normal reciprocal pattern  FUNCTIONAL TESTS:  5x STS: 15.47 sec    PATIENT SURVEYS:  PSFS: THE PATIENT SPECIFIC FUNCTIONAL SCALE  Place score of 0-10 (0 = unable to perform activity and 10 = able to perform activity at the same level as before injury or problem)  Activity Date: 03/05/24    Walking 5    2.       3.     4.      Total Score 5      Total Score = Sum of activity scores/number of activities  Minimally Detectable Change: 3 points (for single activity); 2 points (for average score)  [facility] (nd). The Patient Specific Functional Scale . Retrieved from [email or web address]                                                                                                                                 GOALS: Goals reviewed with patient? Yes  SHORT TERM GOALS: Target  date: 04/02/2024   Pt will be ind with initial HEP Baseline: Goal status: INITIAL  2.  Pt will be able to cross L LE over R to demo increasing hip flexibility Baseline: Unable on eval Goal status: INITIAL  3.  Pt will report improvement of tightness by >/=50% Baseline:  Goal status: INITIAL   LONG TERM GOALS: Target date: 04/30/2024   Pt will be ind with management and progression of HEP Baseline:  Goal status: INITIAL  2.  Pt will have improved Berg Balance Score to >/=52/56 to demo MCID and decrease fall risk Baseline: 44/56 Goal status: INITIAL  3.  Pt will have improved 5x STS to </=13 sec to demo increased functional LE strength and decrease fall risk Baseline: 15.47 sec Goal status: INITIAL  4.  Pt will have improved PSFS score average to >/=7 Baseline: 5 Goal status: INITIAL  5.  Pt will demo L = R hip/LE mobility Baseline:  Goal status: INITIAL   ASSESSMENT:  CLINICAL IMPRESSION: Pt able to obtain seated figure 4 today after initial stretching in supine. Continued progressive hip strengthening and initiated core strengthening working on improving TSA contraction and posterior pelvic tilt. Good effort and no noted issues with today's session. He continues to progress well towards goals.   OBJECTIVE IMPAIRMENTS: Abnormal gait, decreased activity tolerance, decreased balance, decreased coordination, decreased endurance, decreased mobility, difficulty walking, decreased ROM, decreased strength, hypomobility, increased fascial restrictions, increased muscle spasms, impaired flexibility, improper body mechanics, postural dysfunction, and pain.    PLAN:  PT FREQUENCY: 2x/week  PT DURATION: 8 weeks  PLANNED INTERVENTIONS: 97164- PT Re-evaluation, 97750- Physical Performance Testing, 97110-Therapeutic exercises, 97530- Therapeutic activity, [id number]- Neuromuscular re-education, 97535- Self Care, [id number]- Manual therapy, [phone]- Gait training, [phone]- Aquatic Therapy,  [phone]- Electrical stimulation (unattended), [phone]- Traction (mechanical), [id number]- Ionotophoresis 4mg /ml Dexamethasone , 79439 (1-2 muscles), [id number] (3+ muscles)- Dry Needling, Patient/Family education, Balance training, Stair training, Taping, Joint mobilization, Spinal mobilization, Cryotherapy, and Moist heat  PLAN FOR NEXT SESSION: Assess response to HEP. Work on Chiropractor. Core and hip  strengthening. Balance. Standing hip flexion/step over obstacles   8:37 AM, 03/12/24 Joslynne Klatt April Ma L Adrie Picking, PT, DPT Physical Therapist- [place] Office Number: [phone]

## 2024-03-14 ENCOUNTER — Ambulatory Visit

## 2024-03-14 DIAGNOSIS — M5459 Other low back pain: Secondary | ICD-10-CM

## 2024-03-14 DIAGNOSIS — R2689 Other abnormalities of gait and mobility: Secondary | ICD-10-CM | POA: Diagnosis not present

## 2024-03-14 DIAGNOSIS — M6281 Muscle weakness (generalized): Secondary | ICD-10-CM

## 2024-03-14 DIAGNOSIS — M25652 Stiffness of left hip, not elsewhere classified: Secondary | ICD-10-CM

## 2024-03-14 DIAGNOSIS — R293 Abnormal posture: Secondary | ICD-10-CM | POA: Diagnosis not present

## 2024-03-14 NOTE — Therapy (Signed)
 OUTPATIENT PHYSICAL THERAPY NEURO TREATMENT   Patient Name: Daniel Yu MRN: [id number] DOB:[birth year], 89 y.o., male Today's Date: 03/14/2024   PCP: Charlott Dorn LABOR, MD REFERRING PROVIDER: Charlott Dorn LABOR, MD  END OF SESSION:  PT End of Session - 03/14/24 0847     Visit Number 4    Number of Visits 16    Date for PT Re-Evaluation 04/30/24    Authorization Type [employer]    Authorization Time Period approved 16 PT visits from 03/05/2024 - 04/30/2024    Authorization - Visit Number 4    Authorization - Number of Visits 16    Progress Note Due on Visit 10    PT Start Time 0847    PT Stop Time 0930    PT Time Calculation (min) 43 min    Activity Tolerance Patient tolerated treatment well    Behavior During Therapy [facility] for tasks assessed/performed           Past Medical History:  Diagnosis Date   Arthritis    right knee oa   Asthma    Carpal tunnel syndrome    BILATERAL   Cholecystitis    Complication of anesthesia    adverse reaction to the spinal   Hypertension    PAF (paroxysmal atrial fibrillation) (HCC)    SBO (small bowel obstruction) (HCC) 12/2015   Past Surgical History:  Procedure Laterality Date   CARDIOVERSION N/A 02/14/2017   Procedure: CARDIOVERSION;  Surgeon: Delford Maude BROCKS, MD;  Location: [facility] ENDOSCOPY;  Service: Cardiovascular;  Laterality: N/A;   CARDIOVERSION N/A 12/11/2019   Procedure: CARDIOVERSION;  Surgeon: Pietro Redell RAMAN, MD;  Location: [facility] ENDOSCOPY;  Service: Cardiovascular;  Laterality: N/A;   CARDIOVERSION N/A 02/13/2022   Procedure: CARDIOVERSION;  Surgeon: Pietro Redell RAMAN, MD;  Location: [facility] ENDOSCOPY;  Service: Cardiovascular;  Laterality: N/A;   CHOLECYSTECTOMY  02/09/2017   CHOLECYSTECTOMY N/A 02/09/2017   Procedure: LAPAROSCOPIC CHOLECYSTECTOMY WITH INTRAOPERATIVE CHOLANGIOGRAM;  Surgeon: Belinda Cough, MD;  Location: [facility] OR;  Service: General;  Laterality: N/A;   COLONOSCOPY WITH PROPOFOL  N/A 11/09/2014   Procedure:  COLONOSCOPY WITH PROPOFOL ;  Surgeon: Gladis MARLA Louder, MD;  Location: [facility] ENDOSCOPY;  Service: Endoscopy;  Laterality: N/A;   HERNIA REPAIR     2013   LEFT HEART CATH AND CORONARY ANGIOGRAPHY N/A 05/23/2022   Procedure: LEFT HEART CATH AND CORONARY ANGIOGRAPHY;  Surgeon: [place], Peter M, MD;  Location: [facility] INVASIVE CV LAB;  Service: Cardiovascular;  Laterality: N/A;   QUADRICEPS TENDON REPAIR Left 06/07/2015   Procedure: REPAIR LEFT QUADRICEP TENDON;  Surgeon: Dempsey Moan, MD;  Location: [facility] ORS;  Service: Orthopedics;  Laterality: Left;   TEE WITHOUT CARDIOVERSION N/A 02/14/2017   Procedure: TRANSESOPHAGEAL ECHOCARDIOGRAM (TEE);  Surgeon: Delford Maude BROCKS, MD;  Location: [facility] ENDOSCOPY;  Service: Cardiovascular;  Laterality: N/A;   TOTAL KNEE ARTHROPLASTY Right 12/27/2015   Procedure: RIGHT TOTAL KNEE ARTHROPLASTY;  Surgeon: Dempsey Moan, MD;  Location: [facility] ORS;  Service: Orthopedics;  Laterality: Right;   Patient Active Problem List   Diagnosis Date Noted   Orthostatic hypotension 08/13/2023   Syncope and collapse 08/12/2023   Chronic diastolic CHF (congestive heart failure) (HCC) 08/12/2023   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 coli) (HCC)    Acute systolic CHF (congestive heart failure) (HCC)    Paroxysmal atrial fibrillation (HCC)    Bacteremia due to Escherichia coli    Hypokalemia    Sepsis (HCC) 02/08/2017   Abnormal abdominal CT scan 02/08/2017   Asthma    Arthritis    SBO (small bowel obstruction) (HCC) 01/03/2016   Dehydration with hyponatremia  01/03/2016   S/P right knee surgery/TKA  01/03/2016   Acute hypokalemia 01/03/2016   Essential hypertension 01/03/2016   Acute hyperglycemia 01/03/2016   OA (osteoarthritis) of knee 12/27/2015   Rupture of left quadriceps tendon 06/07/2015   Quadriceps tendon rupture 06/07/2015    ONSET DATE: 2016  REFERRING DIAG: R29.6 (ICD-10-CM) - Repeated falls M54.50 (ICD-10-CM) - Low back pain, unspecified  THERAPY DIAG:  Other abnormalities of gait and mobility  Abnormal posture  Muscle weakness (generalized)  Other low back pain  Stiffness of left hip, not elsewhere classified  Rationale for Evaluation and Treatment: Rehabilitation  SUBJECTIVE:                                                                                                                                                                                             SUBJECTIVE STATEMENT: Feeling a little better, slowly but surely  Pt accompanied by: self  PERTINENT HISTORY: [name], L quad repair 2016  PAIN:  Are you having pain? Yes: NPRS scale: no pain but stiff Pain location: L leg to low back Pain description: Stiffness Aggravating factors: Walking, getting in/out of shower using grab bars Relieving factors: nothing currently  PRECAUTIONS: Fall  RED FLAGS: None   WEIGHT BEARING RESTRICTIONS: No  FALLS: Has patient fallen in last 6 months? Yes. Number of falls 2 falls. 1 in Aug 11, 2023 -- BP dropped; another in May 2025 coming in the door and fell backwards on stoop  LIVING ENVIRONMENT: Lives with: lives with their spouse Lives in: House/apartment Stairs: Yes: Internal: 12 steps; bilateral but cannot reach both and External: 1 stoop steps; none Has following equipment at home: None  PLOF: Independent; likes to read, goes to [employer]  PATIENT GOALS: Improve L LE tightness/pain and walking  OBJECTIVE:   TODAY'S TREATMENT: 03/14/24 Activity Comments  Hooklying hip abd/add isometric 2x10   Hooklying  hip flex iso 1x10   Seated hip IR 2x10 Green loop ankles, ball btwn knees  Sidestep x 60 sec Green loop knees  Backward Monster walk x  60 sec Green loop knees  Left hip mobilization/ROM -Long axis traction/distraction grade 2-3. Inferior glide for hip abduction ROM -DKTC x 60 sec -Thomas test stretch 2x60 sec with therapist overpressure for knee flexion LLE -hooklying fig 4 stretch w/ assist     TODAY'S TREATMENT: 03/12/24 Activity Comments  NU-step L5 x 6 min  Using both UEs/LEs, seat 11  LTR 5x10 holds ITB stretch with strap 3x 30 L only DKTC x30 Piriformis stretch x 1 min Internal rotation stretch x 1 min Figure 4 stretch x1 min Performed in supine   Bridge with ball squeeze Able to lift off ~1  Hip ER in figure 4 stretch Iso press into PT hand  TSA contraction with knees extended Cues to keep from holding his breath  TSA contraction + SLR x10   Seated figure 4 x30   Standing side step x 5 laps By counter, green TB around knees  Backwards walking x 5 laps By counter, green TB around knees. Cues to keep chest up and hips forward to keep from flexing at trunk     PATIENT EDUCATION: Education details: Exam findings, POC, initial HEP Person educated: Patient Education method: Explanation, Demonstration, and Handouts Education comprehension: verbalized understanding, returned demonstration, and needs further education  HOME EXERCISE PROGRAM: Access Code: [email or web address] URL: [email or web address] Date: 03/12/2024 Prepared by: Gellen April Earnie Starring  Exercises - Seated Hamstring Stretch  - 1 x daily - 7 x weekly - 2 sets - 30 sec hold - Standing Lumbar Extension  - 1 x daily - 7 x weekly - 1 sets - 10 reps - Supine Lower Trunk Rotation  - 1 x daily - 7 x weekly - 1 sets - 10 reps - 5 sec hold - Supine Double Knee to Chest  - 1 x daily - 7 x weekly - 2 sets - 30 sec hold - Supine Figure 4 Piriformis Stretch  - 1 x daily - 7 x weekly - 3 sets - 30 sec hold -  Supine Anterior Posterior Pelvic Tilt  - 1 x daily - 7 x weekly - 1 sets - 10 reps - Side Stepping with Resistance at Thighs and Counter Support  - 2-3 x weekly - 1-3 sets - 60 sec rounds hold - Backward Monster Walk with Resistance at [employer] and Counter Support  - 1-3 x daily - 2-3 x weekly - 2 sets - 10 reps - Seated Hip Internal Rotation with Ball and Resistance  - 1 x daily - 2-3 x weekly - 3 sets - 10 reps  Note: Objective measures were completed at Evaluation unless otherwise noted.  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  PALPATION: Tenderness and tightness notable in L lumbar paraspinal, QL, slightly in L glute max  MUSCLE LENGTH: Hamstrings: Right 75 deg; Left 62 deg SLR Thomas test: Did not assess Quads in prone: Right 97 deg; Left 95 deg FABER: L much tighter than R   POSTURE: L lateral hip shift, trunk flexed at pelvis with pt in anterior pelvic tilt  LOWER EXTREMITY ROM:     Passive  Right Eval Left Eval  Hip flexion    Hip extension    Hip abduction    Hip adduction    Hip internal rotation 25 pulls on lower L side 35  Hip external rotation 35 30  Knee flexion    Knee extension    Ankle dorsiflexion    Ankle plantarflexion  Ankle inversion    Ankle eversion     (Blank rows = not tested)  LOWER EXTREMITY MMT:    MMT Right Eval Left Eval  Hip flexion 3+ 3+  Hip extension 2+ 2+  Hip abduction 3 3  Hip adduction    Hip internal rotation    Hip external rotation    Knee flexion 5 3+  Knee extension 5 4-  Ankle dorsiflexion    Ankle plantarflexion    Ankle inversion    Ankle eversion    (Blank rows = not tested)  GAIT: Findings: Distance walked: Into clinic and Comments: Normal reciprocal pattern  FUNCTIONAL TESTS:  5x STS: 15.47 sec    PATIENT SURVEYS:  PSFS: THE PATIENT SPECIFIC FUNCTIONAL SCALE  Place score of 0-10 (0 = unable to perform  activity and 10 = able to perform activity at the same level as before injury or problem)  Activity Date: 03/05/24    Walking 5    2.       3.     4.      Total Score 5      Total Score = Sum of activity scores/number of activities  Minimally Detectable Change: 3 points (for single activity); 2 points (for average score)  [facility] (nd). The Patient Specific Functional Scale . Retrieved from [email or web address]                                                                                                                                 GOALS: Goals reviewed with patient? Yes  SHORT TERM GOALS: Target date: 04/02/2024   Pt will be ind with initial HEP Baseline: Goal status: IN PROGRESS  2.  Pt will be able to cross L LE over R to demo increasing hip flexibility Baseline: Unable on eval Goal status: IN PROGRESS  3.  Pt will report improvement of tightness by >/=50% Baseline:  Goal status: IN PROGRESS   LONG TERM GOALS: Target date: 04/30/2024   Pt will be ind with management and progression of HEP Baseline:  Goal status: INITIAL  2.  Pt will have improved Berg Balance Score to >/=52/56 to demo MCID and decrease fall risk Baseline: 44/56 Goal status: INITIAL  3.  Pt will have improved 5x STS to </=13 sec to demo increased functional LE strength and decrease fall risk Baseline: 15.47 sec Goal status: INITIAL  4.  Pt will have improved PSFS score average to >/=7 Baseline: 5 Goal status: INITIAL  5.  Pt will demo L = R hip/LE mobility Baseline:  Goal status: INITIAL   ASSESSMENT:  CLINICAL IMPRESSION: Initiated w/ isometric strength for hip/pelvis for improved recruitment and facilitation followed by active resistance for hip stabilization. Mobilization/stretching for improved flexibility to reduce stiffness and improved ROM for increased ease of ADL requiring physical assist for assisting left  hip into external rotation. +Thomas test w/ significant restriction of left rectus femoris and  addressed with stretching. Good recall to HEP and reports increased ease of assuming LLE in figure 4 position. Continued sessions to progress POC details to improve mobility and reduce hip pain/discomfort   OBJECTIVE IMPAIRMENTS: Abnormal gait, decreased activity tolerance, decreased balance, decreased coordination, decreased endurance, decreased mobility, difficulty walking, decreased ROM, decreased strength, hypomobility, increased fascial restrictions, increased muscle spasms, impaired flexibility, improper body mechanics, postural dysfunction, and pain.    PLAN:  PT FREQUENCY: 2x/week  PT DURATION: 8 weeks  PLANNED INTERVENTIONS: 97164- PT Re-evaluation, 97750- Physical Performance Testing, 97110-Therapeutic exercises, 97530- Therapeutic activity, 97112- Neuromuscular re-education, 97535- Self Care, [id number]- Manual therapy, [phone]- Gait training, [phone]- Aquatic Therapy, [phone]- Electrical stimulation (unattended), [phone]- Traction (mechanical), [id number]- Ionotophoresis 4mg /ml Dexamethasone , 79439 (1-2 muscles), [id number] (3+ muscles)- Dry Needling, Patient/Family education, Balance training, Stair training, Taping, Joint mobilization, Spinal mobilization, Cryotherapy, and Moist heat  PLAN FOR NEXT SESSION: Assess response to HEP. Work on Chiropractor. Core and hip strengthening. Balance. Standing hip flexion/step over obstacles   8:48 AM, 03/14/24 Jonette MARLA Sandifer, PT, DPT Physical Therapist- [place] Office Number: [phone]

## 2024-03-19 ENCOUNTER — Ambulatory Visit

## 2024-03-20 DIAGNOSIS — N1831 Chronic kidney disease, stage 3a: Secondary | ICD-10-CM | POA: Diagnosis not present

## 2024-03-20 DIAGNOSIS — I2510 Atherosclerotic heart disease of native coronary artery without angina pectoris: Secondary | ICD-10-CM | POA: Diagnosis not present

## 2024-03-20 DIAGNOSIS — I5032 Chronic diastolic (congestive) heart failure: Secondary | ICD-10-CM | POA: Diagnosis not present

## 2024-03-20 DIAGNOSIS — I130 Hypertensive heart and chronic kidney disease with heart failure and stage 1 through stage 4 chronic kidney disease, or unspecified chronic kidney disease: Secondary | ICD-10-CM | POA: Diagnosis not present

## 2024-03-21 ENCOUNTER — Ambulatory Visit: Attending: Internal Medicine

## 2024-03-21 DIAGNOSIS — M6281 Muscle weakness (generalized): Secondary | ICD-10-CM | POA: Diagnosis not present

## 2024-03-21 DIAGNOSIS — M25652 Stiffness of left hip, not elsewhere classified: Secondary | ICD-10-CM | POA: Diagnosis not present

## 2024-03-21 DIAGNOSIS — M5459 Other low back pain: Secondary | ICD-10-CM | POA: Insufficient documentation

## 2024-03-21 DIAGNOSIS — R2689 Other abnormalities of gait and mobility: Secondary | ICD-10-CM | POA: Insufficient documentation

## 2024-03-21 DIAGNOSIS — R293 Abnormal posture: Secondary | ICD-10-CM | POA: Diagnosis not present

## 2024-03-21 NOTE — Therapy (Signed)
 OUTPATIENT PHYSICAL THERAPY NEURO TREATMENT   Patient Name: Daniel Yu MRN: [id number] DOB:1932/08/20, 89 y.o., male Today's Date: 03/21/2024   PCP: Charlott Dorn LABOR, MD REFERRING PROVIDER: Charlott Dorn LABOR, MD  END OF SESSION:  PT End of Session - 03/21/24 0853     Visit Number 5    Number of Visits 16    Date for PT Re-Evaluation 04/30/24    Authorization Type [employer]    Authorization Time Period approved 16 PT visits from 03/05/2024 - 04/30/2024    Authorization - Visit Number 5    Authorization - Number of Visits 16    Progress Note Due on Visit 10    PT Start Time 0848    PT Stop Time 0930    PT Time Calculation (min) 42 min    Activity Tolerance Patient tolerated treatment well    Behavior During Therapy WFL for tasks assessed/performed           Past Medical History:  Diagnosis Date   Arthritis    right knee oa   Asthma    Carpal tunnel syndrome    BILATERAL   Cholecystitis    Complication of anesthesia    adverse reaction to the spinal   Hypertension    PAF (paroxysmal atrial fibrillation) (HCC)    SBO (small bowel obstruction) (HCC) 12/2015   Past Surgical History:  Procedure Laterality Date   CARDIOVERSION N/A 02/14/2017   Procedure: CARDIOVERSION;  Surgeon: Delford Maude BROCKS, MD;  Location: [facility] ENDOSCOPY;  Service: Cardiovascular;  Laterality: N/A;   CARDIOVERSION N/A 12/11/2019   Procedure: CARDIOVERSION;  Surgeon: Pietro Redell RAMAN, MD;  Location: [facility] ENDOSCOPY;  Service: Cardiovascular;  Laterality: N/A;   CARDIOVERSION N/A 02/13/2022   Procedure: CARDIOVERSION;  Surgeon: Pietro Redell RAMAN, MD;  Location: [facility] ENDOSCOPY;  Service: Cardiovascular;  Laterality: N/A;   CHOLECYSTECTOMY  02/09/2017   CHOLECYSTECTOMY N/A 02/09/2017   Procedure: LAPAROSCOPIC CHOLECYSTECTOMY WITH INTRAOPERATIVE CHOLANGIOGRAM;  Surgeon: Belinda Cough, MD;  Location: [facility] OR;  Service: General;  Laterality: N/A;   COLONOSCOPY WITH PROPOFOL  N/A 11/09/2014   Procedure:  COLONOSCOPY WITH PROPOFOL ;  Surgeon: Gladis MARLA Louder, MD;  Location: [facility] ENDOSCOPY;  Service: Endoscopy;  Laterality: N/A;   HERNIA REPAIR     2013   LEFT HEART CATH AND CORONARY ANGIOGRAPHY N/A 05/23/2022   Procedure: LEFT HEART CATH AND CORONARY ANGIOGRAPHY;  Surgeon: [place], Peter M, MD;  Location: [facility] INVASIVE CV LAB;  Service: Cardiovascular;  Laterality: N/A;   QUADRICEPS TENDON REPAIR Left 06/07/2015   Procedure: REPAIR LEFT QUADRICEP TENDON;  Surgeon: Dempsey Moan, MD;  Location: [facility] ORS;  Service: Orthopedics;  Laterality: Left;   TEE WITHOUT CARDIOVERSION N/A 02/14/2017   Procedure: TRANSESOPHAGEAL ECHOCARDIOGRAM (TEE);  Surgeon: Delford Maude BROCKS, MD;  Location: [facility] ENDOSCOPY;  Service: Cardiovascular;  Laterality: N/A;   TOTAL KNEE ARTHROPLASTY Right 12/27/2015   Procedure: RIGHT TOTAL KNEE ARTHROPLASTY;  Surgeon: Dempsey Moan, MD;  Location: [facility] ORS;  Service: Orthopedics;  Laterality: Right;   Patient Active Problem List   Diagnosis Date Noted   Orthostatic hypotension 08/13/2023   Syncope and collapse 08/12/2023   Chronic diastolic CHF (congestive heart failure) (HCC) 08/12/2023   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 coli) (HCC)    Acute systolic CHF (congestive heart failure) (HCC)    Paroxysmal atrial fibrillation (HCC)    Bacteremia due to Escherichia coli    Hypokalemia    Sepsis (HCC) 02/08/2017   Abnormal abdominal CT scan 02/08/2017   Asthma    Arthritis    SBO (small bowel obstruction) (HCC) 01/03/2016   Dehydration with hyponatremia  01/03/2016   S/P right knee surgery/TKA  01/03/2016   Acute hypokalemia 01/03/2016   Essential hypertension 01/03/2016   Acute hyperglycemia 01/03/2016   OA (osteoarthritis) of knee 12/27/2015   Rupture of left quadriceps tendon 06/07/2015   Quadriceps tendon rupture 06/07/2015    ONSET DATE: 2016  REFERRING DIAG: R29.6 (ICD-10-CM) - Repeated falls M54.50 (ICD-10-CM) - Low back pain, unspecified  THERAPY DIAG:  Other abnormalities of gait and mobility  Abnormal posture  Muscle weakness (generalized)  Stiffness of left hip, not elsewhere classified  Rationale for Evaluation and Treatment: Rehabilitation  SUBJECTIVE:                                                                                                                                                                                             SUBJECTIVE STATEMENT: Have had a bit of a regression. Not a pain, but the leg is feeling weak and increasingly stiff. Seems a little better today. Maybe the seated hamstring stretch got it Pt accompanied by: self  PERTINENT HISTORY: [name], L quad repair 2016  PAIN:  Are you having pain? Yes: NPRS scale: no pain but stiff Pain location: L leg to low back Pain description: Stiffness Aggravating factors: Walking, getting in/out of shower using grab bars Relieving factors: nothing currently  PRECAUTIONS: Fall  RED FLAGS: None   WEIGHT BEARING RESTRICTIONS: No  FALLS: Has patient fallen in last 6 months? Yes. Number of falls 2 falls. 1 in Aug 11, 2023 -- BP dropped; another in May 2025 coming in the door and fell backwards on stoop  LIVING ENVIRONMENT: Lives with: lives with their spouse Lives in: House/apartment Stairs: Yes: Internal: 12 steps; bilateral but cannot reach both and External: 1 stoop steps; none Has following equipment at home: None  PLOF: Independent; likes to read, goes to [employer]  PATIENT GOALS: Improve L LE tightness/pain and walking  OBJECTIVE:    TODAY'S TREATMENT: 03/21/24 Activity Comments  Resisted tests -symptomatic to seated hip abd and external rotation  Piriformis rocking, ischemic compression w/ contract relax  Hooklying clamshells 3x10 Blue loop  Hooklying march 3x10 Blue loop  SKTC 2x60 sec   Active hamstring stretch 15x 3 sec   Manual therapy Lumbar spine PIVM PA grade 2-3--feels referred symptoms to left buttocks     TODAY'S TREATMENT: 03/14/24 Activity Comments  Hooklying hip abd/add isometric 2x10   Hooklying hip flex iso 1x10   Seated hip IR 2x10 Green loop ankles, ball btwn knees  Sidestep x 60 sec Green loop knees  Backward Monster walk x 60 sec Green loop knees  Left hip mobilization/ROM -Long axis traction/distraction grade 2-3. Inferior glide for hip abduction ROM -DKTC x 60 sec -Thomas test stretch 2x60 sec with therapist overpressure for knee flexion LLE -hooklying fig 4 stretch w/ assist       PATIENT EDUCATION: Education details: Exam findings, POC, initial HEP Person educated: Patient Education method: Explanation, Demonstration, and Handouts Education comprehension: verbalized understanding, returned demonstration, and needs further education  HOME EXERCISE PROGRAM: Access Code: [email or web address] URL: [email or web address] Date: 03/12/2024 Prepared by: Gellen April Earnie Starring  Exercises - Seated Hamstring Stretch  - 1 x daily - 7 x weekly - 2 sets - 30 sec hold - Standing Lumbar Extension  - 1 x daily - 7 x weekly - 1 sets - 10 reps - Supine Lower Trunk Rotation  - 1 x daily - 7 x weekly - 1 sets - 10 reps - 5 sec hold - Supine Double Knee to Chest  - 1 x daily - 7 x weekly - 2 sets - 30 sec hold - Supine Figure 4 Piriformis Stretch  - 1 x daily - 7 x weekly - 3 sets - 30 sec hold - Supine Anterior Posterior Pelvic Tilt  - 1 x daily - 7 x weekly - 1 sets - 10 reps - Side Stepping with Resistance at Thighs and Counter Support  - 2-3 x weekly - 1-3 sets - 60 sec rounds hold -  Backward Monster Walk with Resistance at [employer] and Counter Support  - 1-3 x daily - 2-3 x weekly - 2 sets - 10 reps - Seated Hip Internal Rotation with Ball and Resistance  - 1 x daily - 2-3 x weekly - 3 sets - 10 reps - Hooklying Clamshell with Resistance  - 2-3 x weekly - 3 sets - 10 reps - Supine March with Resistance Band  - 2-3 x weekly - 3 sets - 10 reps - Supine Hamstring Stretch  - 1 x daily - 7 x weekly - 1 sets - 10-15 reps  Note: Objective measures were completed at Evaluation unless otherwise noted.  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  PALPATION: Tenderness and tightness notable in L lumbar paraspinal, QL, slightly in L glute max  MUSCLE LENGTH: Hamstrings: Right 75 deg; Left 62 deg SLR Thomas test: Did not assess Quads in prone: Right 97 deg; Left 95 deg FABER: L much tighter than R   POSTURE: L lateral hip shift, trunk flexed at pelvis with pt in anterior pelvic tilt  LOWER EXTREMITY ROM:     Passive  Right Eval Left Eval  Hip flexion    Hip extension    Hip abduction    Hip adduction    Hip internal rotation 25 pulls on lower L side 35  Hip external rotation 35 30  Knee flexion    Knee extension    Ankle dorsiflexion    Ankle plantarflexion    Ankle  inversion    Ankle eversion     (Blank rows = not tested)  LOWER EXTREMITY MMT:    MMT Right Eval Left Eval  Hip flexion 3+ 3+  Hip extension 2+ 2+  Hip abduction 3 3  Hip adduction    Hip internal rotation    Hip external rotation    Knee flexion 5 3+  Knee extension 5 4-  Ankle dorsiflexion    Ankle plantarflexion    Ankle inversion    Ankle eversion    (Blank rows = not tested)  GAIT: Findings: Distance walked: Into clinic and Comments: Normal reciprocal pattern  FUNCTIONAL TESTS:  5x STS: 15.47 sec    PATIENT SURVEYS:  PSFS: THE PATIENT SPECIFIC FUNCTIONAL SCALE  Place score of 0-10 (0 =  unable to perform activity and 10 = able to perform activity at the same level as before injury or problem)  Activity Date: 03/05/24    Walking 5    2.       3.     4.      Total Score 5      Total Score = Sum of activity scores/number of activities  Minimally Detectable Change: 3 points (for single activity); 2 points (for average score)  [facility] (nd). The Patient Specific Functional Scale . Retrieved from [email or web address]                                                                                                                                 GOALS: Goals reviewed with patient? Yes  SHORT TERM GOALS: Target date: 04/02/2024   Pt will be ind with initial HEP Baseline: Goal status: IN PROGRESS  2.  Pt will be able to cross L LE over R to demo increasing hip flexibility Baseline: Unable on eval Goal status: IN PROGRESS  3.  Pt will report improvement of tightness by >/=50% Baseline:  Goal status: IN PROGRESS   LONG TERM GOALS: Target date: 04/30/2024   Pt will be ind with management and progression of HEP Baseline:  Goal status: INITIAL  2.  Pt will have improved Berg Balance Score to >/=52/56 to demo MCID and decrease fall risk Baseline: 44/56 Goal status: INITIAL  3.  Pt will have improved 5x STS to </=13 sec to demo increased functional LE strength and decrease fall risk Baseline: 15.47 sec Goal status: INITIAL  4.  Pt will have improved PSFS score average to >/=7 Baseline: 5 Goal status: INITIAL  5.  Pt will demo L = R hip/LE mobility Baseline:  Goal status: INITIAL   ASSESSMENT:  CLINICAL IMPRESSION: Continuing to experience left hip stiffness vs pain and comes into clinic with pronounced LLE antalgic pattern. Experiencing tenderness to left glutes/piriformis with tenderness to palpation. Addressed with ongoing resistance training for lumbopelvic activiation/stabilization.  Modified HEP for hamstring/sciatic neural flossing. Manual therapy to improve lumbar extension with experience of referred symptoms with PA pressure  to lumbar spine.  Improved ambulation at end of session w/out antalgia. Continued sessions to address left hip and lumbar dysfunction  OBJECTIVE IMPAIRMENTS: Abnormal gait, decreased activity tolerance, decreased balance, decreased coordination, decreased endurance, decreased mobility, difficulty walking, decreased ROM, decreased strength, hypomobility, increased fascial restrictions, increased muscle spasms, impaired flexibility, improper body mechanics, postural dysfunction, and pain.    PLAN:  PT FREQUENCY: 2x/week  PT DURATION: 8 weeks  PLANNED INTERVENTIONS: 97164- PT Re-evaluation, 97750- Physical Performance Testing, 97110-Therapeutic exercises, 97530- Therapeutic activity, 97112- Neuromuscular re-education, 97535- Self Care, 02859- Manual therapy, [phone]- Gait training, [phone]- Aquatic Therapy, [phone]- Electrical stimulation (unattended), [phone]- Traction (mechanical), [id number]- Ionotophoresis 4mg /ml Dexamethasone , 79439 (1-2 muscles), 20561 (3+ muscles)- Dry Needling, Patient/Family education, Balance training, Stair training, Taping, Joint mobilization, Spinal mobilization, Cryotherapy, and Moist heat  PLAN FOR NEXT SESSION: Assess response to HEP. Work on Chiropractor. Core and hip strengthening. Balance. Standing hip flexion/step over obstacles   8:53 AM, 03/21/24 Jonette MARLA Sandifer, PT, DPT Physical Therapist- [place] Office Number: [phone]

## 2024-03-24 ENCOUNTER — Inpatient Hospital Stay: Payer: Medicare Other | Admitting: Oncology

## 2024-03-24 ENCOUNTER — Inpatient Hospital Stay: Payer: Medicare Other | Attending: Oncology

## 2024-03-24 VITALS — BP 123/71 | HR 76 | Temp 98.2°F | Resp 18 | Ht 70.0 in | Wt 184.8 lb

## 2024-03-24 DIAGNOSIS — R768 Other specified abnormal immunological findings in serum: Secondary | ICD-10-CM | POA: Diagnosis not present

## 2024-03-24 DIAGNOSIS — I4891 Unspecified atrial fibrillation: Secondary | ICD-10-CM | POA: Diagnosis not present

## 2024-03-24 DIAGNOSIS — I43 Cardiomyopathy in diseases classified elsewhere: Secondary | ICD-10-CM | POA: Insufficient documentation

## 2024-03-24 DIAGNOSIS — I959 Hypotension, unspecified: Secondary | ICD-10-CM | POA: Diagnosis not present

## 2024-03-24 DIAGNOSIS — R319 Hematuria, unspecified: Secondary | ICD-10-CM | POA: Diagnosis not present

## 2024-03-24 DIAGNOSIS — E854 Organ-limited amyloidosis: Secondary | ICD-10-CM | POA: Insufficient documentation

## 2024-03-24 LAB — CBC WITH DIFFERENTIAL (CANCER CENTER ONLY)
Abs Immature Granulocytes: 0.01 K/uL (ref 0.00–0.07)
Basophils Absolute: 0 K/uL (ref 0.0–0.1)
Basophils Relative: 0 %
Eosinophils Absolute: 0.1 K/uL (ref 0.0–0.5)
Eosinophils Relative: 1 %
HCT: 45.6 % (ref 39.0–52.0)
Hemoglobin: 15.3 g/dL (ref 13.0–17.0)
Immature Granulocytes: 0 %
Lymphocytes Relative: 14 %
Lymphs Abs: 1 K/uL (ref 0.7–4.0)
MCH: 34.2 pg — ABNORMAL HIGH (ref 26.0–34.0)
MCHC: 33.6 g/dL (ref 30.0–36.0)
MCV: 102 fL — ABNORMAL HIGH (ref 80.0–100.0)
Monocytes Absolute: 0.6 K/uL (ref 0.1–1.0)
Monocytes Relative: 7 %
Neutro Abs: 5.9 K/uL (ref 1.7–7.7)
Neutrophils Relative %: 78 %
Platelet Count: 246 K/uL (ref 150–400)
RBC: 4.47 MIL/uL (ref 4.22–5.81)
RDW: 12.9 % (ref 11.5–15.5)
WBC Count: 7.7 K/uL (ref 4.0–10.5)
nRBC: 0 % (ref 0.0–0.2)

## 2024-03-24 NOTE — Progress Notes (Signed)
  [facility] OFFICE PROGRESS NOTE   Diagnosis: Cardiac amyloidosis  INTERVAL HISTORY:   Daniel Yu is as scheduled.  He continues tafamidis .  Dyspnea remains improved.  He is followed by Dr. Pietro.  He is scheduled for an echocardiogram in November. He was evaluated by urology for an episode of hematuria.  He recently had a basal cell carcinoma removed from the left face.  He was admitted in December 2024 with syncope secondary to hypotension.  Good appetite.  He reports intentional weight loss.  No fever or night sweats.  No recent infection.  He has discomfort at the left leg after a remote injury.  He is participating in physical therapy.   Objective:  Vital signs in last 24 hours:  Blood pressure 123/71, pulse 76, temperature 98.2 F (36.8 C), temperature source Temporal, resp. rate 18, height 5' 10 (1.778 m), weight 184 lb 12.8 oz (83.8 kg), SpO2 99%.    HEENT: No macroglossia Lymphatics: No cervical, supraclavicular, axillary, or inguinal nodes Resp: Lungs with end inspiratory rales at the lower posterior chest bilaterally, no respiratory distress Cardio: Regular rate and rhythm GI: No hepatosplenomegaly Vascular: No leg edema   Lab Results:  Lab Results  Component Value Date   WBC 7.7 03/24/2024   HGB 15.3 03/24/2024   HCT 45.6 03/24/2024   MCV 102.0 (H) 03/24/2024   PLT 246 03/24/2024   NEUTROABS 5.9 03/24/2024    CMP  Lab Results  Component Value Date   NA 137 01/10/2024   K 4.5 01/10/2024   CL 102 01/10/2024   CO2 20 01/10/2024   GLUCOSE 84 01/10/2024   BUN 23 01/10/2024   CREATININE 1.20 01/10/2024   CALCIUM  8.8 01/10/2024   PROT 6.5 01/10/2024   ALBUMIN 3.8 01/10/2024   AST 23 01/10/2024   ALT 30 01/10/2024   ALKPHOS 78 01/10/2024   BILITOT 1.0 01/10/2024   GFRNONAA 50 (L) 08/13/2023   GFRAA 91 12/02/2019     Medications: I have reviewed the patient's current medications.   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 Urine immunofixation 07/27/2022-positive Bence-Jones protein-kappa Serum immunofixation 07/27/2022-negative Mild elevation of serum free kappa light chains 07/27/2022 Serum protein electrophoresis/immunofixation 08/08/2022-negative 24-hour urine 08/09/2022-immunofixation negative for a monoclonal protein Bone marrow biopsy 08/24/2022-negative for multiple myeloma, amyloidosis, and lymphoma, mildly hypercellular marrow with benign lymphoid aggregates, 4% polyclonal plasma cells, Congo red stain negative, 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  He has chronic Red cell macrocytosis but he is not anemic.  There is no clinical evidence for progression to multiple myeloma or systemic amyloidosis.  We will follow-up on the serum kappa light chains from today.  He continues tafamidis  and clinical follow-up with Dr. Pietro.  He will return for an office and lab visit in 8 months.  Arley Hof, MD  03/24/2024  9:15 AM

## 2024-03-25 LAB — KAPPA/LAMBDA LIGHT CHAINS
Kappa free light chain: 27.9 mg/L — ABNORMAL HIGH (ref 3.3–19.4)
Kappa, lambda light chain ratio: 1.26 (ref 0.26–1.65)
Lambda free light chains: 22.1 mg/L (ref 5.7–26.3)

## 2024-03-26 ENCOUNTER — Ambulatory Visit

## 2024-03-26 DIAGNOSIS — R293 Abnormal posture: Secondary | ICD-10-CM | POA: Diagnosis not present

## 2024-03-26 DIAGNOSIS — M5459 Other low back pain: Secondary | ICD-10-CM | POA: Diagnosis not present

## 2024-03-26 DIAGNOSIS — R2689 Other abnormalities of gait and mobility: Secondary | ICD-10-CM | POA: Diagnosis not present

## 2024-03-26 DIAGNOSIS — M6281 Muscle weakness (generalized): Secondary | ICD-10-CM | POA: Diagnosis not present

## 2024-03-26 DIAGNOSIS — M25652 Stiffness of left hip, not elsewhere classified: Secondary | ICD-10-CM

## 2024-03-26 NOTE — Therapy (Signed)
 OUTPATIENT PHYSICAL THERAPY NEURO TREATMENT   Patient Name: Daniel Yu MRN: [id number] DOB:1931/09/29, 89 y.o., male Today's Date: 03/26/2024   PCP: Charlott Dorn LABOR, MD REFERRING PROVIDER: Charlott Dorn LABOR, MD  END OF SESSION:  PT End of Session - 03/26/24 0835     Visit Number 6    Number of Visits 16    Date for PT Re-Evaluation 04/30/24    Authorization Type [employer]    Authorization Time Period approved 16 PT visits from 03/05/2024 - 04/30/2024    Authorization - Visit Number 6    Authorization - Number of Visits 16    Progress Note Due on Visit 10    PT Start Time 0836    PT Stop Time 0930    PT Time Calculation (min) 54 min    Activity Tolerance Patient tolerated treatment well    Behavior During Therapy [facility] for tasks assessed/performed           Past Medical History:  Diagnosis Date   Arthritis    right knee oa   Asthma    Carpal tunnel syndrome    BILATERAL   Cholecystitis    Complication of anesthesia    adverse reaction to the spinal   Hypertension    PAF (paroxysmal atrial fibrillation) (HCC)    SBO (small bowel obstruction) (HCC) 12/2015   Past Surgical History:  Procedure Laterality Date   CARDIOVERSION N/A 02/14/2017   Procedure: CARDIOVERSION;  Surgeon: Delford Maude BROCKS, MD;  Location: [facility] ENDOSCOPY;  Service: Cardiovascular;  Laterality: N/A;   CARDIOVERSION N/A 12/11/2019   Procedure: CARDIOVERSION;  Surgeon: Pietro Redell RAMAN, MD;  Location: [facility] ENDOSCOPY;  Service: Cardiovascular;  Laterality: N/A;   CARDIOVERSION N/A 02/13/2022   Procedure: CARDIOVERSION;  Surgeon: Pietro Redell RAMAN, MD;  Location: [facility] ENDOSCOPY;  Service: Cardiovascular;  Laterality: N/A;   CHOLECYSTECTOMY  02/09/2017   CHOLECYSTECTOMY N/A 02/09/2017   Procedure: LAPAROSCOPIC CHOLECYSTECTOMY WITH INTRAOPERATIVE CHOLANGIOGRAM;  Surgeon: Belinda Cough, MD;  Location: [facility] OR;  Service: General;  Laterality: N/A;   COLONOSCOPY WITH PROPOFOL  N/A 11/09/2014   Procedure:  COLONOSCOPY WITH PROPOFOL ;  Surgeon: Gladis MARLA Louder, MD;  Location: [facility] ENDOSCOPY;  Service: Endoscopy;  Laterality: N/A;   HERNIA REPAIR     2013   LEFT HEART CATH AND CORONARY ANGIOGRAPHY N/A 05/23/2022   Procedure: LEFT HEART CATH AND CORONARY ANGIOGRAPHY;  Surgeon: [place], Peter M, MD;  Location: [facility] INVASIVE CV LAB;  Service: Cardiovascular;  Laterality: N/A;   QUADRICEPS TENDON REPAIR Left 06/07/2015   Procedure: REPAIR LEFT QUADRICEP TENDON;  Surgeon: Dempsey Moan, MD;  Location: [facility] ORS;  Service: Orthopedics;  Laterality: Left;   TEE WITHOUT CARDIOVERSION N/A 02/14/2017   Procedure: TRANSESOPHAGEAL ECHOCARDIOGRAM (TEE);  Surgeon: Delford Maude BROCKS, MD;  Location: [facility] ENDOSCOPY;  Service: Cardiovascular;  Laterality: N/A;   TOTAL KNEE ARTHROPLASTY Right 12/27/2015   Procedure: RIGHT TOTAL KNEE ARTHROPLASTY;  Surgeon: Dempsey Moan, MD;  Location: [facility] ORS;  Service: Orthopedics;  Laterality: Right;   Patient Active Problem List   Diagnosis Date Noted   Orthostatic hypotension 08/13/2023   Syncope and collapse 08/12/2023   Chronic diastolic CHF (congestive heart failure) (HCC) 08/12/2023   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 coli) (HCC)    Acute systolic CHF (congestive heart failure) (HCC)    Paroxysmal atrial fibrillation (HCC)    Bacteremia due to Escherichia coli    Hypokalemia    Sepsis (HCC) 02/08/2017   Abnormal abdominal CT scan 02/08/2017   Asthma    Arthritis    SBO (small bowel obstruction) (HCC) 01/03/2016   Dehydration with hyponatremia  01/03/2016   S/P right knee surgery/TKA  01/03/2016   Acute hypokalemia 01/03/2016   Essential hypertension 01/03/2016   Acute hyperglycemia 01/03/2016   OA (osteoarthritis) of knee 12/27/2015   Rupture of left quadriceps tendon 06/07/2015   Quadriceps tendon rupture 06/07/2015    ONSET DATE: 2016  REFERRING DIAG: R29.6 (ICD-10-CM) - Repeated falls M54.50 (ICD-10-CM) - Low back pain, unspecified  THERAPY DIAG:  Other abnormalities of gait and mobility  Abnormal posture  Muscle weakness (generalized)  Stiffness of left hip, not elsewhere classified  Rationale for Evaluation and Treatment: Rehabilitation  SUBJECTIVE:                                                                                                                                                                                             SUBJECTIVE STATEMENT: After session on Friday, very stiff on Saturday-Sunday. Started feeling better after walking stairs at home.  Currently no pain, just stiff Pt accompanied by: self  PERTINENT HISTORY: [name], L quad repair 2016  PAIN:  Are you having pain? Yes: NPRS scale: no pain but stiff Pain location: L leg to low back Pain description: Stiffness Aggravating factors: Walking, getting in/out of shower using grab bars Relieving factors: nothing currently  PRECAUTIONS: Fall  RED FLAGS: None   WEIGHT BEARING RESTRICTIONS: No  FALLS: Has patient fallen in last 6 months? Yes. Number of falls 2 falls. 1 in Aug 11, 2023 -- BP dropped; another in May 2025 coming in the door and fell backwards on stoop  LIVING ENVIRONMENT: Lives with: lives with their spouse Lives in: House/apartment Stairs: Yes: Internal: 12 steps; bilateral but cannot reach both and External: 1 stoop steps; none Has following equipment at home: None  PLOF: Independent; likes to read, goes to [employer]  PATIENT GOALS: Improve L LE tightness/pain and walking  OBJECTIVE:   TODAY'S TREATMENT:  03/26/24 Activity Comments  Pt education/discussion Regarding lumbar spine anatomy and mechanics  Supine hip flex iso 1x10   LTR x 60 sec   quadruped -supported w/ physioball--bracing 1x10 -partial  hip extension w/ tactile cues to paraspinals/multifidi  Prone quad stretch 2x60 sec Stabliizing to left lumbar to address rectus femoris contracture/ant pelvic tilt  stretching -DKTC x 2 min w/ assist -hooklying fig 4 piriformis x 2 min w/ assist -piriformis/ITB x 2 min -partial long-sit hamstring stretch x 2 min     TODAY'S TREATMENT: 03/21/24 Activity Comments  Resisted tests -symptomatic to seated hip abd and external rotation  Piriformis rocking, ischemic compression w/ contract relax   Hooklying clamshells 3x10 Blue loop  Hooklying march 3x10 Blue loop  SKTC 2x60 sec   Active hamstring stretch 15x 3 sec   Manual therapy Lumbar spine PIVM PA grade 2-3--feels referred symptoms to left buttocks         PATIENT EDUCATION: Education details: Exam findings, POC, initial HEP Person educated: Patient Education method: Explanation, Demonstration, and Handouts Education comprehension: verbalized understanding, returned demonstration, and needs further education  HOME EXERCISE PROGRAM: Access Code: [email or web address] URL: [email or web address] Date: 03/12/2024 Prepared by: Gellen April Earnie Starring  Exercises - Standing Lumbar Extension  - 1 x daily - 7 x weekly - 1 sets - 10 reps - Supine Lower Trunk Rotation  - 1 x daily - 7 x weekly - 1 sets - 10 reps - 5 sec hold - Supine Double Knee to Chest  - 1 x daily - 7 x weekly - 2 sets - 30 sec hold - Supine Figure 4 Piriformis Stretch  - 1 x daily - 7 x weekly - 3 sets - 30 sec hold - Side Stepping with Resistance at Thighs and Counter Support  - 2-3 x weekly - 1-3 sets - 60 sec rounds hold - Backward Monster Walk with Resistance at [employer] and Counter Support  - 1-3 x daily - 2-3 x weekly - 2 sets - 10 reps - Seated Hip Internal  Rotation with Ball and Resistance  - 1 x daily - 2-3 x weekly - 3 sets - 10 reps - Hooklying Clamshell with Resistance  - 2-3 x weekly - 3 sets - 10 reps - Supine March with Resistance Band  - 2-3 x weekly - 3 sets - 10 reps - Supine Hamstring Stretch  - 1 x daily - 7 x weekly - 1 sets - 10-15 reps - Hooklying Isometric Hip Flexion  - 1 x daily - 7 x weekly - 1-3 sets - 10 reps  Note: Objective measures were completed at Evaluation unless otherwise noted.  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  PALPATION: Tenderness and tightness notable in L lumbar paraspinal, QL, slightly in L glute max  MUSCLE LENGTH: Hamstrings: Right 75 deg; Left 62 deg SLR Thomas test: Did not assess Quads in prone: Right 97 deg; Left 95 deg FABER: L much tighter than R   POSTURE: L lateral hip shift, trunk flexed at pelvis with pt in anterior pelvic tilt  LOWER EXTREMITY ROM:     Passive  Right Eval Left Eval  Hip flexion    Hip extension    Hip abduction    Hip adduction    Hip internal rotation 25 pulls on lower L side 35  Hip external rotation 35 30  Knee flexion    Knee extension    Ankle dorsiflexion    Ankle plantarflexion    Ankle inversion    Ankle eversion     (Blank rows = not tested)  LOWER EXTREMITY MMT:    MMT Right Eval Left  Eval  Hip flexion 3+ 3+  Hip extension 2+ 2+  Hip abduction 3 3  Hip adduction    Hip internal rotation    Hip external rotation    Knee flexion 5 3+  Knee extension 5 4-  Ankle dorsiflexion    Ankle plantarflexion    Ankle inversion    Ankle eversion    (Blank rows = not tested)  GAIT: Findings: Distance walked: Into clinic and Comments: Normal reciprocal pattern  FUNCTIONAL TESTS:  5x STS: 15.47 sec    PATIENT SURVEYS:  PSFS: THE PATIENT SPECIFIC FUNCTIONAL SCALE  Place score of 0-10 (0 = unable to perform activity and 10 = able to perform  activity at the same level as before injury or problem)  Activity Date: 03/05/24    Walking 5    2.       3.     4.      Total Score 5      Total Score = Sum of activity scores/number of activities  Minimally Detectable Change: 3 points (for single activity); 2 points (for average score)  [facility] (nd). The Patient Specific Functional Scale . Retrieved from [email or web address]                                                                                                                                 GOALS: Goals reviewed with patient? Yes  SHORT TERM GOALS: Target date: 04/02/2024   Pt will be ind with initial HEP Baseline: Goal status: IN PROGRESS  2.  Pt will be able to cross L LE over R to demo increasing hip flexibility Baseline: Unable on eval Goal status: IN PROGRESS  3.  Pt will report improvement of tightness by >/=50% Baseline:  Goal status: IN PROGRESS   LONG TERM GOALS: Target date: 04/30/2024   Pt will be ind with management and progression of HEP Baseline:  Goal status: INITIAL  2.  Pt will have improved Berg Balance Score to >/=52/56 to demo MCID and decrease fall risk Baseline: 44/56 Goal status: INITIAL  3.  Pt will have improved 5x STS to </=13 sec to demo increased functional LE strength and decrease fall risk Baseline: 15.47 sec Goal status: INITIAL  4.  Pt will have improved PSFS score average to >/=7 Baseline: 5 Goal status: INITIAL  5.  Pt will demo L = R hip/LE mobility Baseline:  Goal status: INITIAL   ASSESSMENT:  CLINICAL IMPRESSION: Inititaed with education regarding lumbar spine anatomy/mechanics and his likely radicular issue affecting left hip as well as soft tissue dysfunction/restrictions.  Continued with training for improve core strength for improve lumbar-pelvic stability/recruitment. Static stretching to improve flexibility with emphasis to  flexion-bias and hip external rotation to improve ROM for increased ease of ADL completion. Notes improved left hip flexibility since beginning PT. Continued sessions to advance POC details  OBJECTIVE IMPAIRMENTS: Abnormal gait, decreased activity tolerance, decreased balance, decreased  coordination, decreased endurance, decreased mobility, difficulty walking, decreased ROM, decreased strength, hypomobility, increased fascial restrictions, increased muscle spasms, impaired flexibility, improper body mechanics, postural dysfunction, and pain.    PLAN:  PT FREQUENCY: 2x/week  PT DURATION: 8 weeks  PLANNED INTERVENTIONS: 97164- PT Re-evaluation, 97750- Physical Performance Testing, 97110-Therapeutic exercises, 97530- Therapeutic activity, 97112- Neuromuscular re-education, 97535- Self Care, [id number]- Manual therapy, [phone]- Gait training, [phone]- Aquatic Therapy, [phone]- Electrical stimulation (unattended), [phone]- Traction (mechanical), [id number]- Ionotophoresis 4mg /ml Dexamethasone , 79439 (1-2 muscles), [id number] (3+ muscles)- Dry Needling, Patient/Family education, Balance training, Stair training, Taping, Joint mobilization, Spinal mobilization, Cryotherapy, and Moist heat  PLAN FOR NEXT SESSION: Assess response to HEP. Work on Chiropractor. Core and hip strengthening. Balance. Standing hip flexion/step over obstacles   8:36 AM, 03/26/24 Jonette MARLA Sandifer, PT, DPT Physical Therapist- [place] Office Number: [phone]

## 2024-03-28 ENCOUNTER — Ambulatory Visit

## 2024-03-28 DIAGNOSIS — R2689 Other abnormalities of gait and mobility: Secondary | ICD-10-CM

## 2024-03-28 DIAGNOSIS — M5459 Other low back pain: Secondary | ICD-10-CM

## 2024-03-28 DIAGNOSIS — M6281 Muscle weakness (generalized): Secondary | ICD-10-CM

## 2024-03-28 DIAGNOSIS — R293 Abnormal posture: Secondary | ICD-10-CM | POA: Diagnosis not present

## 2024-03-28 DIAGNOSIS — M25652 Stiffness of left hip, not elsewhere classified: Secondary | ICD-10-CM | POA: Diagnosis not present

## 2024-03-28 NOTE — Therapy (Signed)
 OUTPATIENT PHYSICAL THERAPY NEURO TREATMENT   Patient Name: Daniel Yu MRN: [id number] DOB:05-04-1932, 89 y.o., male Today's Date: 03/28/2024   PCP: Charlott Dorn LABOR, MD REFERRING PROVIDER: Charlott Dorn LABOR, MD  END OF SESSION:  PT End of Session - 03/28/24 0800     Visit Number 7    Number of Visits 16    Date for PT Re-Evaluation 04/30/24    Authorization Type [employer]    Authorization Time Period approved 16 PT visits from 03/05/2024 - 04/30/2024    Authorization - Visit Number 7    Authorization - Number of Visits 16    Progress Note Due on Visit 10    PT Start Time 0800    PT Stop Time 0845    PT Time Calculation (min) 45 min    Activity Tolerance Patient tolerated treatment well    Behavior During Therapy WFL for tasks assessed/performed           Past Medical History:  Diagnosis Date   Arthritis    right knee oa   Asthma    Carpal tunnel syndrome    BILATERAL   Cholecystitis    Complication of anesthesia    adverse reaction to the spinal   Hypertension    PAF (paroxysmal atrial fibrillation) (HCC)    SBO (small bowel obstruction) (HCC) 12/2015   Past Surgical History:  Procedure Laterality Date   CARDIOVERSION N/A 02/14/2017   Procedure: CARDIOVERSION;  Surgeon: Delford Maude BROCKS, MD;  Location: [facility] ENDOSCOPY;  Service: Cardiovascular;  Laterality: N/A;   CARDIOVERSION N/A 12/11/2019   Procedure: CARDIOVERSION;  Surgeon: Pietro Redell RAMAN, MD;  Location: [facility] ENDOSCOPY;  Service: Cardiovascular;  Laterality: N/A;   CARDIOVERSION N/A 02/13/2022   Procedure: CARDIOVERSION;  Surgeon: Pietro Redell RAMAN, MD;  Location: [facility] ENDOSCOPY;  Service: Cardiovascular;  Laterality: N/A;   CHOLECYSTECTOMY  02/09/2017   CHOLECYSTECTOMY N/A 02/09/2017   Procedure: LAPAROSCOPIC CHOLECYSTECTOMY WITH INTRAOPERATIVE CHOLANGIOGRAM;  Surgeon: Belinda Cough, MD;  Location: [facility] OR;  Service: General;  Laterality: N/A;   COLONOSCOPY WITH PROPOFOL  N/A 11/09/2014   Procedure:  COLONOSCOPY WITH PROPOFOL ;  Surgeon: Gladis MARLA Louder, MD;  Location: [facility] ENDOSCOPY;  Service: Endoscopy;  Laterality: N/A;   HERNIA REPAIR     2013   LEFT HEART CATH AND CORONARY ANGIOGRAPHY N/A 05/23/2022   Procedure: LEFT HEART CATH AND CORONARY ANGIOGRAPHY;  Surgeon: [place], Peter M, MD;  Location: [facility] INVAS[facility] CV LAB;  Service: Cardiovascular;  Laterality: N/A;   QUADRICEPS TENDON REPAIR Left 06/07/2015   Procedure: REPAIR LEFT QUADRICEP TENDON;  Surgeon: Dempsey Moan, MD;  Location: [facility] ORS;  Service: Orthopedics;  Laterality: Left;   TEE WITHOUT CARDIOVERSION N/A 02/14/2017   Procedure: TRANSESOPHAGEAL ECHOCARDIOGRAM (TEE);  Surgeon: Delford Maude BROCKS, MD;  Location: [facility] ENDOSCOPY;  Service: Cardiovascular;  Laterality: N/A;   TOTAL KNEE ARTHROPLASTY Right 12/27/2015   Procedure: RIGHT TOTAL KNEE ARTHROPLASTY;  Surgeon: Dempsey Moan, MD;  Location: [facility] ORS;  Service: Orthopedics;  Laterality: Right;   Patient Active Problem List   Diagnosis Date Noted   Orthostatic hypotension 08/13/2023   Syncope and collapse 08/12/2023   Chronic diastolic CHF (congestive heart failure) (HCC) 08/12/2023   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 coli) (HCC)    Acute systolic CHF (congestive heart failure) (HCC)    Paroxysmal atrial fibrillation (HCC)    Bacteremia due to Escherichia coli    Hypokalemia    Sepsis (HCC) 02/08/2017   Abnormal abdominal CT scan 02/08/2017   Asthma    Arthritis    SBO (small bowel obstruction) (HCC) 01/03/2016   Dehydration with hyponatremia  01/03/2016   S/P right knee surgery/TKA  01/03/2016   Acute hypokalemia 01/03/2016   Essential hypertension 01/03/2016   Acute hyperglycemia 01/03/2016   OA (osteoarthritis) of knee 12/27/2015   Rupture of left quadriceps tendon 06/07/2015   Quadriceps tendon rupture 06/07/2015    ONSET DATE: 2016  REFERRING DIAG: R29.6 (ICD-10-CM) - Repeated falls M54.50 (ICD-10-CM) - Low back pain, unspecified  THERAPY DIAG:  Other abnormalities of gait and mobility  Abnormal posture  Muscle weakness (generalized)  Stiffness of left hip, not elsewhere classified  Other low back pain  Rationale for Evaluation and Treatment: Rehabilitation  SUBJECTIVE:                                                                                                                                                                                             SUBJECTIVE STATEMENT: Not too bad after last session Pt accompanied by: self  PERTINENT HISTORY: [name], L quad repair 2016  PAIN:  Are you having pain? Yes: NPRS scale: no pain but stiff Pain location: L leg to low back Pain description: Stiffness Aggravating factors: Walking, getting in/out of shower using grab bars Relieving factors: nothing currently  PRECAUTIONS: Fall  RED FLAGS: None   WEIGHT BEARING RESTRICTIONS: No  FALLS: Has patient fallen in last 6 months? Yes. Number of falls 2 falls. 1 in Aug 11, 2023 -- BP dropped; another in May 2025 coming in the door and fell backwards on stoop  LIVING ENVIRONMENT: Lives with: lives with their spouse Lives in: House/apartment Stairs: Yes: Internal: 12 steps; bilateral but cannot reach both and External: 1 stoop steps; none Has following equipment at home: None  PLOF: Independent; likes to read, goes to [employer]  PATIENT GOALS: Improve L LE tightness/pain and walking  OBJECTIVE:   TODAY'S TREATMENT: 03/28/24 Activity Comments  Physioball roll out x 2 min   Seated march x 2 min Seated on  dynadisc  Front rack sit to stand 2x10 22 seat height, 6# dumbell  Suitcase carry x 2 min 6#  Paloff press 2x10  10# cable  Straight arm pull down 2x10 10# cable  Seated lumbar extensions x 2 min Against foam roll     TODAY'S TREATMENT: 03/26/24 Activity Comments  Pt education/discussion Regarding lumbar spine anatomy and mechanics  Supine hip flex iso 1x10   LTR x 60 sec   quadruped -supported w/ physioball--bracing 1x10 -partial hip extension w/ tactile cues to paraspinals/multifidi  Prone quad stretch 2x60 sec Stabliizing to left lumbar to address rectus femoris contracture/ant pelvic tilt  stretching -DKTC x 2 min w/ assist -hooklying fig 4 piriformis x 2 min w/ assist -piriformis/ITB x 2 min -partial long-sit hamstring stretch x 2 min           PATIENT EDUCATION: Education details: Exam findings, POC, initial HEP Person educated: Patient Education method: Explanation, Demonstration, and Handouts Education comprehension: verbalized understanding, returned demonstration, and needs further education  HOME EXERCISE PROGRAM: Access Code: [email or web address] URL: [email or web address] Date: 03/12/2024 Prepared by: Gellen April Earnie Starring  Exercises - Standing Lumbar Extension  - 1 x daily - 7 x weekly - 1 sets - 10 reps - Supine Lower Trunk Rotation  - 1 x daily - 7 x weekly - 1 sets - 10 reps - 5 sec hold - Supine Double Knee to Chest  - 1 x daily - 7 x weekly - 2 sets - 30 sec hold - Supine Figure 4 Piriformis Stretch  - 1 x daily - 7 x weekly - 3 sets - 30 sec hold - Side Stepping with Resistance at Thighs and Counter Support  - 2-3 x weekly - 1-3 sets - 60 sec rounds hold - Backward Monster Walk with Resistance at [employer] and Counter Support  - 1-3 x daily - 2-3 x weekly - 2 sets - 10 reps - Seated Hip Internal Rotation with Ball and Resistance  - 1 x daily - 2-3 x weekly - 3 sets - 10 reps - Hooklying Clamshell with Resistance  - 2-3 x weekly - 3 sets - 10 reps -  Supine March with Resistance Band  - 2-3 x weekly - 3 sets - 10 reps - Supine Hamstring Stretch  - 1 x daily - 7 x weekly - 1 sets - 10-15 reps - Hooklying Isometric Hip Flexion  - 1 x daily - 7 x weekly - 1-3 sets - 10 reps - Barbell Front Squat  - 1 x daily - 7 x weekly - 3 sets - 10 reps - Kettlebell Suitcase Carry  - 1 x daily - 7 x weekly - 3 sets - 2 min rounds hold - Standing Anti-Rotation Press with Anchored Resistance  - 1 x daily - 7 x weekly - 3 sets - 10 reps  Note: Objective measures were completed at Evaluation unless otherwise noted.  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  PALPATION: Tenderness and tightness notable in L lumbar paraspinal, QL, slightly in L glute max  MUSCLE LENGTH: Hamstrings: Right 75 deg; Left 62 deg SLR Thomas test: Did not assess Quads in prone: Right 97 deg; Left 95 deg FABER: L much tighter than R   POSTURE: L lateral hip shift, trunk flexed at pelvis with pt in anterior pelvic tilt  LOWER EXTREMITY ROM:     Passive  Right Eval Left Eval  Hip flexion    Hip extension    Hip abduction    Hip adduction    Hip internal rotation 25 pulls on lower L side 35  Hip external rotation 35 30  Knee flexion    Knee extension    Ankle dorsiflexion    Ankle plantarflexion    Ankle inversion    Ankle eversion     (Blank rows = not tested)  LOWER EXTREMITY MMT:    MMT Right Eval Left Eval  Hip flexion 3+ 3+  Hip extension 2+ 2+  Hip abduction 3 3  Hip adduction    Hip internal rotation    Hip external rotation    Knee flexion 5 3+  Knee extension 5 4-  Ankle dorsiflexion    Ankle plantarflexion    Ankle inversion    Ankle eversion    (Blank rows = not tested)  GAIT: Findings: Distance walked: Into clinic and Comments: Normal reciprocal pattern  FUNCTIONAL TESTS:  5x STS: 15.47 sec    PATIENT SURVEYS:  PSFS: THE PATIENT SPECIFIC  FUNCTIONAL SCALE  Place score of 0-10 (0 = unable to perform activity and 10 = able to perform activity at the same level as before injury or problem)  Activity Date: 03/05/24    Walking 5    2.       3.     4.      Total Score 5      Total Score = Sum of activity scores/number of activities  Minimally Detectable Change: 3 points (for single activity); 2 points (for average score)  [facility] (nd). The Patient Specific Functional Scale . Retrieved from [email or web address]                                                                                                                                 GOALS: Goals reviewed with patient? Yes  SHORT TERM GOALS: Target date: 04/02/2024   Pt will be ind with initial HEP Baseline: Goal status: IN PROGRESS  2.  Pt will be able to cross L LE over R to demo increasing hip flexibility Baseline: Unable on eval Goal status: IN PROGRESS  3.  Pt will report improvement of tightness by >/=50% Baseline:  Goal status: IN PROGRESS   LONG TERM GOALS: Target date: 04/30/2024   Pt will be ind with management and progression of HEP Baseline:  Goal status: INITIAL  2.  Pt will have improved Berg Balance Score to >/=52/56 to demo MCID and decrease fall risk Baseline: 44/56 Goal status: INITIAL  3.  Pt will have improved 5x STS to </=13 sec to demo increased functional LE strength and decrease fall risk Baseline: 15.47 sec Goal status: INITIAL  4.  Pt will have improved PSFS score average to >/=7 Baseline: 5 Goal status: INITIAL  5.  Pt will demo L = R hip/LE mobility Baseline:  Goal status: INITIAL   ASSESSMENT:  CLINICAL IMPRESSION: Continued with techniques to improve core strength/stabilization with emphasis on dynamic isometrics for static trunk stability against different demands/planes with excellent form and tolerance throughout. Demo of lumbar  lordosis  extension/support with Mckenzie pillow or lumbar support. Continued sessions to progress POC details   OBJECTIVE IMPAIRMENTS: Abnormal gait, decreased activity tolerance, decreased balance, decreased coordination, decreased endurance, decreased mobility, difficulty walking, decreased ROM, decreased strength, hypomobility, increased fascial restrictions, increased muscle spasms, impaired flexibility, improper body mechanics, postural dysfunction, and pain.    PLAN:  PT FREQUENCY: 2x/week  PT DURATION: 8 weeks  PLANNED INTERVENTIONS: 97164- PT Re-evaluation, 97750- Physical Performance Testing, 97110-Therapeutic exercises, 97530- Therapeutic activity, [id number]- Neuromuscular re-education, 97535- Self Care, [id number]- Manual therapy, [phone]- Gait training, [id number]- Aquatic Therapy, [phone]- Electrical stimulation (unattended), [id number]- Traction (mechanical), [id number]- Ionotophoresis 4mg /ml Dexamethasone , 79439 (1-2 muscles), 20561 (3+ muscles)- Dry Needling, Patient/Family education, Balance training, Stair training, Taping, Joint mobilization, Spinal mobilization, Cryotherapy, and Moist heat  PLAN FOR NEXT SESSION: Assess response to HEP. Work on Chiropractor. Core and hip strengthening. Balance. Standing hip flexion/step over obstacles   8:01 AM, 03/28/24 Jonette MARLA Sandifer, PT, DPT Physical Therapist- [place] Office Number: [phone]

## 2024-04-02 ENCOUNTER — Ambulatory Visit

## 2024-04-02 DIAGNOSIS — M6281 Muscle weakness (generalized): Secondary | ICD-10-CM

## 2024-04-02 DIAGNOSIS — R2689 Other abnormalities of gait and mobility: Secondary | ICD-10-CM | POA: Diagnosis not present

## 2024-04-02 DIAGNOSIS — M5459 Other low back pain: Secondary | ICD-10-CM

## 2024-04-02 DIAGNOSIS — M25652 Stiffness of left hip, not elsewhere classified: Secondary | ICD-10-CM

## 2024-04-02 DIAGNOSIS — R293 Abnormal posture: Secondary | ICD-10-CM | POA: Diagnosis not present

## 2024-04-02 NOTE — Therapy (Signed)
 OUTPATIENT PHYSICAL THERAPY NEURO TREATMENT   Patient Name: Daniel Yu MRN: [id number] DOB:08/13/1932, 89 y.o., male Today's Date: 04/02/2024   PCP: Charlott Dorn LABOR, MD REFERRING PROVIDER: Charlott Dorn LABOR, MD  END OF SESSION:  PT End of Session - 04/02/24 0839     Visit Number 8    Number of Visits 16    Date for PT Re-Evaluation 04/30/24    Authorization Type [employer]    Authorization Time Period approved 16 PT visits from 03/05/2024 - 04/30/2024    Authorization - Visit Number 8    Authorization - Number of Visits 16    Progress Note Due on Visit 10    PT Start Time 0845    PT Stop Time 0930    PT Time Calculation (min) 45 min    Activity Tolerance Patient tolerated treatment well    Behavior During Therapy WFL for tasks assessed/performed           Past Medical History:  Diagnosis Date   Arthritis    right knee oa   Asthma    Carpal tunnel syndrome    BILATERAL   Cholecystitis    Complication of anesthesia    adverse reaction to the spinal   Hypertension    PAF (paroxysmal atrial fibrillation) (HCC)    SBO (small bowel obstruction) (HCC) 12/2015   Past Surgical History:  Procedure Laterality Date   CARDIOVERSION N/A 02/14/2017   Procedure: CARDIOVERSION;  Surgeon: Delford Maude BROCKS, MD;  Location: [facility] ENDOSCOPY;  Service: Cardiovascular;  Laterality: N/A;   CARDIOVERSION N/A 12/11/2019   Procedure: CARDIOVERSION;  Surgeon: Pietro Redell RAMAN, MD;  Location: [facility] ENDOSCOPY;  Service: Cardiovascular;  Laterality: N/A;   CARDIOVERSION N/A 02/13/2022   Procedure: CARDIOVERSION;  Surgeon: Pietro Redell RAMAN, MD;  Location: [facility] ENDOSCOPY;  Service: Cardiovascular;  Laterality: N/A;   CHOLECYSTECTOMY  02/09/2017   CHOLECYSTECTOMY N/A 02/09/2017   Procedure: LAPAROSCOPIC CHOLECYSTECTOMY WITH INTRAOPERATIVE CHOLANGIOGRAM;  Surgeon: Belinda Cough, MD;  Location: [facility] OR;  Service: General;  Laterality: N/A;   COLONOSCOPY WITH PROPOFOL  N/A 11/09/2014   Procedure:  COLONOSCOPY WITH PROPOFOL ;  Surgeon: Gladis MARLA Louder, MD;  Location: [facility] ENDOSCOPY;  Service: Endoscopy;  Laterality: N/A;   HERNIA REPAIR     2013   LEFT HEART CATH AND CORONARY ANGIOGRAPHY N/A 05/23/2022   Procedure: LEFT HEART CATH AND CORONARY ANGIOGRAPHY;  Surgeon: Swazil[place], Peter M, MD;  Location: [facility] INVASIVE CV LAB;  Service: Cardiovascular;  Laterality: N/A;   QUADRICEPS TENDON REPAIR Left 06/07/2015   Procedure: REPAIR LEFT QUADRICEP TENDON;  Surgeon: Dempsey Moan, MD;  Location: [facility] ORS;  Service: Orthopedics;  Laterality: Left;   TEE WITHOUT CARDIOVERSION N/A 02/14/2017   Procedure: TRANSESOPHAGEAL ECHOCARDIOGRAM (TEE);  Surgeon: Delford Maude BROCKS, MD;  Location: [facility] ENDOSCOPY;  Service: Cardiovascular;  Laterality: N/A;   TOTAL KNEE ARTHROPLASTY Right 12/27/2015   Procedure: RIGHT TOTAL KNEE ARTHROPLASTY;  Surgeon: Dempsey Moan, MD;  Location: [facility] ORS;  Service: Orthopedics;  Laterality: Right;   Patient Active Problem List   Diagnosis Date Noted   Orthostatic hypotension 08/13/2023   Syncope and collapse 08/12/2023   Chronic diastolic CHF (congestive heart failure) (HCC) 08/12/2023   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 coli) (HCC)    Acute systolic CHF (congestive heart failure) (HCC)    Paroxysmal atrial fibrillation (HCC)    Bacteremia due to Escherichia coli    Hypokalemia    Sepsis (HCC) 02/08/2017   Abnormal abdominal CT scan 02/08/2017   Asthma    Arthritis    SBO (small bowel obstruction) (HCC) 01/03/2016   Dehydration with hyponatremia  01/03/2016   S/P right knee surgery/TKA  01/03/2016   Acute hypokalemia 01/03/2016   Essential hypertension 01/03/2016   Acute hyperglycemia 01/03/2016   OA (osteoarthritis) of knee 12/27/2015   Rupture of left quadriceps tendon 06/07/2015   Quadriceps tendon rupture 06/07/2015    ONSET DATE: 2016  REFERRING DIAG: R29.6 (ICD-10-CM) - Repeated falls M54.50 (ICD-10-CM) - Low back pain, unspecified  THERAPY DIAG:  Other abnormalities of gait and mobility  Abnormal posture  Muscle weakness (generalized)  Stiffness of left hip, not elsewhere classified  Other low back pain  Rationale for Evaluation and Treatment: Rehabilitation  SUBJECTIVE:                                                                                                                                                                                             SUBJECTIVE STATEMENT: Doing well, feeling better overall. Less stiffness and improved walking Pt accompanied by: self  PERTINENT HISTORY: [name], L quad repair 2016  PAIN:  Are you having pain? Yes: NPRS scale: no pain but stiff Pain location: L leg to low back Pain description: Stiffness Aggravating factors: Walking, getting in/out of shower using grab bars Relieving factors: nothing currently  PRECAUTIONS: Fall  RED FLAGS: None   WEIGHT BEARING RESTRICTIONS: No  FALLS: Has patient fallen in last 6 months? Yes. Number of falls 2 falls. 1 in Aug 11, 2023 -- BP dropped; another in May 2025 coming in the door and fell backwards on stoop  LIVING ENVIRONMENT: Lives with: lives with their spouse Lives in: House/apartment Stairs: Yes: Internal: 12 steps; bilateral but cannot reach both and External: 1 stoop steps; none Has following equipment at home: None  PLOF: Independent; likes to read, goes to [employer]  PATIENT GOALS: Improve L LE tightness/pain and walking  OBJECTIVE:   TODAY'S TREATMENT: 04/02/24 Activity Comments  LE PRE -LAQ 3x10,  5# -sidestep x 2 min, 5#--incr left retro-Trendelenberg w/ fatigue -alt stair taps x 2 min, 5#, 8 box  NMR -single leg hold LLE on 2 step 5x10 sec -rocker  board x 2 min: lateral, ant-post  manual Manual stretching left hip for increased external rotation and abduction. Long-axis traction and inferior glides. Sfot tissue mobilization lumbar spine and hip girdle to reduce pain/guarding              TODAY'S TREATMENT: 03/28/24 Activity Comments  Physioball roll out x 2 min   Seated march x 2 min Seated on dynadisc  Front rack sit to stand 2x10 22 seat height, 6# dumbell  Suitcase carry x 2 min 6#  Paloff press 2x10 10# cable  Straight arm pull down 2x10 10# cable  Seated lumbar extensions x 2 min Against foam roll     TODAY'S TREATMENT: 03/26/24 Activity Comments  Pt education/discussion Regarding lumbar spine anatomy and mechanics  Supine hip flex iso 1x10   LTR x 60 sec   quadruped -supported w/ physioball--bracing 1x10 -partial hip extension w/ tactile cues to paraspinals/multifidi  Prone quad stretch 2x60 sec Stabliizing to left lumbar to address rectus femoris contracture/ant pelvic tilt  stretching -DKTC x 2 min w/ assist -hooklying fig 4 piriformis x 2 min w/ assist -piriformis/ITB x 2 min -partial long-sit hamstring stretch x 2 min           PATIENT EDUCATION: Education details: Exam findings, POC, initial HEP Person educated: Patient Education method: Explanation, Demonstration, and Handouts Education comprehension: verbalized understanding, returned demonstration, and needs further education  HOME EXERCISE PROGRAM: Access Code: [email or web address] URL: [email or web address] Date: 03/12/2024 Prepared by: Gellen April Earnie Starring  Exercises - Standing Lumbar Extension  - 1 x daily - 7 x weekly - 1 sets - 10 reps - Supine Lower Trunk Rotation  - 1 x daily - 7 x weekly - 1 sets - 10 reps - 5 sec hold - Supine Double Knee to Chest  - 1 x daily - 7 x weekly  - 2 sets - 30 sec hold - Supine Figure 4 Piriformis Stretch  - 1 x daily - 7 x weekly - 3 sets - 30 sec hold - Side Stepping with Resistance at Thighs and Counter Support  - 2-3 x weekly - 1-3 sets - 60 sec rounds hold - Backward Monster Walk with Resistance at [employer] and Counter Support  - 1-3 x daily - 2-3 x weekly - 2 sets - 10 reps - Seated Hip Internal Rotation with Ball and Resistance  - 1 x daily - 2-3 x weekly - 3 sets - 10 reps - Hooklying Clamshell with Resistance  - 2-3 x weekly - 3 sets - 10 reps - Supine March with Resistance Band  - 2-3 x weekly - 3 sets - 10 reps - Supine Hamstring Stretch  - 1 x daily - 7 x weekly - 1 sets - 10-15 reps - Hooklying Isometric Hip Flexion  - 1 x daily - 7 x weekly - 1-3 sets - 10 reps - Barbell Front Squat  - 1 x daily - 7 x weekly - 3 sets - 10 reps - Kettlebell Suitcase Carry  - 1 x daily - 7 x weekly - 3 sets - 2 min rounds hold - Standing Anti-Rotation Press with Anchored Resistance  - 1 x daily - 7 x weekly - 3 sets - 10 reps - Hip Hiking on Step  - 1 x daily - 7 x weekly - 10 reps - 10 sec hold  Note: Objective measures were completed at Evaluation unless otherwise noted.  DIAGNOSTIC FINDINGS:  Lumbar x-ray 12/24/23 IMPRESSION: Multilevel moderate to severe degenerative joint  changes of lumbar spine.  PALPATION: Tenderness and tightness notable in L lumbar paraspinal, QL, slightly in L glute max  MUSCLE LENGTH: Hamstrings: Right 75 deg; Left 62 deg SLR Thomas test: Did not assess Quads in prone: Right 97 deg; Left 95 deg FABER: L much tighter than R   POSTURE: L lateral hip shift, trunk flexed at pelvis with pt in anterior pelvic tilt  LOWER EXTREMITY ROM:     Passive  Right Eval Left Eval  Hip flexion    Hip extension    Hip abduction    Hip adduction    Hip internal rotation 25 pulls on lower L side 35  Hip external rotation 35 30  Knee flexion    Knee extension    Ankle dorsiflexion    Ankle plantarflexion    Ankle  inversion    Ankle eversion     (Blank rows = not tested)  LOWER EXTREMITY MMT:    MMT Right Eval Left Eval  Hip flexion 3+ 3+  Hip extension 2+ 2+  Hip abduction 3 3  Hip adduction    Hip internal rotation    Hip external rotation    Knee flexion 5 3+  Knee extension 5 4-  Ankle dorsiflexion    Ankle plantarflexion    Ankle inversion    Ankle eversion    (Blank rows = not tested)  GAIT: Findings: Distance walked: Into clinic and Comments: Normal reciprocal pattern  FUNCTIONAL TESTS:  5x STS: 15.47 sec    PATIENT SURVEYS:  PSFS: THE PATIENT SPECIFIC FUNCTIONAL SCALE  Place score of 0-10 (0 = unable to perform activity and 10 = able to perform activity at the same level as before injury or problem)  Activity Date: 03/05/24    Walking 5    2.       3.     4.      Total Score 5      Total Score = Sum of activity scores/number of activities  Minimally Detectable Change: 3 points (for single activity); 2 points (for average score)  [facility] (nd). The Patient Specific Functional Scale . Retrieved from [email or web address]                                                                                                                                 GOALS: Goals reviewed with patient? Yes  SHORT TERM GOALS: Target date: 04/02/2024   Pt will be ind with initial HEP Baseline: Goal status: IN PROGRESS  2.  Pt will be able to cross L LE over R to demo increasing hip flexibility Baseline: Unable on eval; able Goal status: MET  3.  Pt will report improvement of tightness by >/=50% Baseline:  Goal status: IN PROGRESS   LONG TERM GOALS: Target date: 04/30/2024   Pt will be ind with management and progression of HEP Baseline:  Goal status: INITIAL  2.  Pt will  have improved Berg Balance Score to >/=52/56 to demo MCID and decrease fall risk Baseline: 44/56 Goal status: INITIAL  3.  Pt will  have improved 5x STS to </=13 sec to demo increased functional LE strength and decrease fall risk Baseline: 15.47 sec Goal status: INITIAL  4.  Pt will have improved PSFS score average to >/=7 Baseline: 5 Goal status: INITIAL  5.  Pt will demo L = R hip/LE mobility Baseline:  Goal status: INITIAL   ASSESSMENT:  CLINICAL IMPRESSION: Notes improved symptoms and decreased antalgic gait. LE strength to improve hip strength/power for stability.  NMR to enhance single limb support LLE. Manual therapy to increase ROM and decrease pain/guarding. Reports overall significant improvements with ability to actively cross LLE over RLE for donning shoes.   OBJECTIVE IMPAIRMENTS: Abnormal gait, decreased activity tolerance, decreased balance, decreased coordination, decreased endurance, decreased mobility, difficulty walking, decreased ROM, decreased strength, hypomobility, increased fascial restrictions, increased muscle spasms, impaired flexibility, improper body mechanics, postural dysfunction, and pain.    PLAN:  PT FREQUENCY: 2x/week  PT DURATION: 8 weeks  PLANNED INTERVENTIONS: 97164- PT Re-evaluation, 97750- Physical Performance Testing, 97110-Therapeutic exercises, 97530- Therapeutic activity, 97112- Neuromuscular re-education, 97535- Self Care, [id number]- Manual therapy, [phone]- Gait training, [phone]- Aquatic Therapy, [phone]- Electrical stimulation (unattended), [phone]- Traction (mechanical), [id number]- Ionotophoresis 4mg /ml Dexamethasone , 79439 (1-2 muscles), 20561 (3+ muscles)- Dry Needling, Patient/Family education, Balance training, Stair training, Taping, Joint mobilization, Spinal mobilization, Cryotherapy, and Moist heat  PLAN FOR NEXT SESSION: Assess response to HEP. Work on Chiropractor. Core and hip strengthening. Balance. Standing hip flexion/step over obstacles   8:39 AM, 04/02/24 Jonette MARLA Sandifer, PT, DPT Physical Therapist- [place] Office Number: [phone]

## 2024-04-04 ENCOUNTER — Ambulatory Visit

## 2024-04-04 DIAGNOSIS — R2689 Other abnormalities of gait and mobility: Secondary | ICD-10-CM

## 2024-04-04 DIAGNOSIS — M5459 Other low back pain: Secondary | ICD-10-CM | POA: Diagnosis not present

## 2024-04-04 DIAGNOSIS — R293 Abnormal posture: Secondary | ICD-10-CM

## 2024-04-04 DIAGNOSIS — M25652 Stiffness of left hip, not elsewhere classified: Secondary | ICD-10-CM | POA: Diagnosis not present

## 2024-04-04 DIAGNOSIS — M6281 Muscle weakness (generalized): Secondary | ICD-10-CM | POA: Diagnosis not present

## 2024-04-04 NOTE — Therapy (Signed)
 OUTPATIENT PHYSICAL THERAPY NEURO TREATMENT   Patient Name: Daniel Yu MRN: [id number] DOB:October 22, 1931, 89 y.o., male Today's Date: 04/04/2024   PCP: Charlott Dorn LABOR, MD REFERRING PROVIDER: Charlott Dorn LABOR, MD  END OF SESSION:  PT End of Session - 04/04/24 0837     Visit Number 9    Number of Visits 16    Date for PT Re-Evaluation 04/30/24    Authorization Type [employer]    Authorization Time Period approved 16 PT visits from 03/05/2024 - 04/30/2024    Authorization - Visit Number 9    Authorization - Number of Visits 16    Progress Note Due on Visit 10    PT Start Time 0835    PT Stop Time 0930    PT Time Calculation (min) 55 min    Activity Tolerance Patient tolerated treatment well    Behavior During Therapy [facility] for tasks assessed/performed           Past Medical History:  Diagnosis Date   Arthritis    right knee oa   Asthma    Carpal tunnel syndrome    BILATERAL   Cholecystitis    Complication of anesthesia    adverse reaction to the spinal   Hypertension    PAF (paroxysmal atrial fibrillation) (HCC)    SBO (small bowel obstruction) (HCC) 12/2015   Past Surgical History:  Procedure Laterality Date   CARDIOVERSION N/A 02/14/2017   Procedure: CARDIOVERSION;  Surgeon: Delford Maude BROCKS, MD;  Location: [facility] ENDOSCOPY;  Service: Cardiovascular;  Laterality: N/A;   CARDIOVERSION N/A 12/11/2019   Procedure: CARDIOVERSION;  Surgeon: Pietro Redell RAMAN, MD;  Location: [facility] ENDOSCOPY;  Service: Cardiovascular;  Laterality: N/A;   CARDIOVERSION N/A 02/13/2022   Procedure: CARDIOVERSION;  Surgeon: Pietro Redell RAMAN, MD;  Location: [facility] ENDOSCOPY;  Service: Cardiovascular;  Laterality: N/A;   CHOLECYSTECTOMY  02/09/2017   CHOLECYSTECTOMY N/A 02/09/2017   Procedure: LAPAROSCOPIC CHOLECYSTECTOMY WITH INTRAOPERATIVE CHOLANGIOGRAM;  Surgeon: Belinda Cough, MD;  Location: [facility] OR;  Service: General;  Laterality: N/A;   COLONOSCOPY WITH PROPOFOL  N/A 11/09/2014   Procedure:  COLONOSCOPY WITH PROPOFOL ;  Surgeon: Gladis MARLA Louder, MD;  Location: [facility] ENDOSCOPY;  Service: Endoscopy;  Laterality: N/A;   HERNIA REPAIR     2013   LEFT HEART CATH AND CORONARY ANGIOGRAPHY N/A 05/23/2022   Procedure: LEFT HEART CATH AND CORONARY ANGIOGRAPHY;  Surgeon: [place], Peter M, MD;  Location: [facility] INVASIVE CV LAB;  Service: Cardiovascular;  Laterality: N/A;   QUADRICEPS TENDON REPAIR Left 06/07/2015   Procedure: REPAIR LEFT QUADRICEP TENDON;  Surgeon: Dempsey Moan, MD;  Location: [facility] ORS;  Service: Orthopedics;  Laterality: Left;   TEE WITHOUT CARDIOVERSION N/A 02/14/2017   Procedure: TRANSESOPHAGEAL ECHOCARDIOGRAM (TEE);  Surgeon: Delford Maude BROCKS, MD;  Location: [facility] ENDOSCOPY;  Service: Cardiovascular;  Laterality: N/A;   TOTAL KNEE ARTHROPLASTY Right 12/27/2015   Procedure: RIGHT TOTAL KNEE ARTHROPLASTY;  Surgeon: Dempsey Moan, MD;  Location: [facility] ORS;  Service: Orthopedics;  Laterality: Right;   Patient Active Problem List   Diagnosis Date Noted   Orthostatic hypotension 08/13/2023   Syncope and collapse 08/12/2023   Chronic diastolic CHF (congestive heart failure) (HCC) 08/12/2023   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 coli) (HCC)    Acute systolic CHF (congestive heart failure) (HCC)    Paroxysmal atrial fibrillation (HCC)    Bacteremia due to Escherichia coli    Hypokalemia    Sepsis (HCC) 02/08/2017   Abnormal abdominal CT scan 02/08/2017   Asthma    Arthritis    SBO (small bowel obstruction) (HCC) 01/03/2016   Dehydration with hyponatremia  01/03/2016   S/P right knee surgery/TKA  01/03/2016   Acute hypokalemia 01/03/2016   Essential hypertension 01/03/2016   Acute hyperglycemia 01/03/2016   OA (osteoarthritis) of knee 12/27/2015   Rupture of left quadriceps tendon 06/07/2015   Quadriceps tendon rupture 06/07/2015    ONSET DATE: 2016  REFERRING DIAG: R29.6 (ICD-10-CM) - Repeated falls M54.50 (ICD-10-CM) - Low back pain, unspecified  THERAPY DIAG:  Other abnormalities of gait and mobility  Abnormal posture  Muscle weakness (generalized)  Stiffness of left hip, not elsewhere classified  Other low back pain  Rationale for Evaluation and Treatment: Rehabilitation  SUBJECTIVE:                                                                                                                                                                                             SUBJECTIVE STATEMENT: Continuing to be well, feeling better overall. Less stiffness and improved walking Pt accompanied by: self  PERTINENT HISTORY: [name], L quad repair 2016  PAIN:  Are you having pain? Yes: NPRS scale: no pain but stiff Pain location: L leg to low back Pain description: Stiffness Aggravating factors: Walking, getting in/out of shower using grab bars Relieving factors: nothing currently  PRECAUTIONS: Fall  RED FLAGS: None   WEIGHT BEARING RESTRICTIONS: No  FALLS: Has patient fallen in last 6 months? Yes. Number of falls 2 falls. 1 in Aug 11, 2023 -- BP dropped; another in May 2025 coming in the door and fell backwards on stoop  LIVING ENVIRONMENT: Lives with: lives with their spouse Lives in: House/apartment Stairs: Yes: Internal: 12 steps; bilateral but cannot reach both and External: 1 stoop steps; none Has following equipment at home: None  PLOF: Independent; likes to read, goes to [employer]  PATIENT GOALS: Improve L LE tightness/pain and walking  OBJECTIVE:   TODAY'S TREATMENT: 04/04/24 Activity Comments  NU-step  level 6 x 8 min   Suitcase carry x 2 min 15# KB, alternating hands  Deadlift 2x10 15# KB, 4 box  Paloff press 2x10 10#, cues for form/sequence  NMR-balance Multisensory balance to facilitate postural control  Manual therapy Manual stretching left hip for increased external rotation and abduction. Long-axis traction and inferior glides. Sfot tissue mobilization lumbar spine and hip girdle to reduce pain/guarding     TODAY'S TREATMENT: 04/02/24 Activity Comments  LE PRE -LAQ 3x10, 5# -sidestep x 2 min, 5#--incr left retro-Trendelenberg w/ fatigue -alt stair taps x 2 min, 5#, 8 box  NMR -single leg hold LLE on 2 step 5x10 sec -rocker board x 2 min: lateral, ant-post  manual Manual stretching left hip for increased external rotation and abduction. Long-axis traction and inferior glides. Sfot tissue mobilization lumbar spine and hip girdle to reduce pain/guarding                   PATIENT EDUCATION: Education details: Exam findings, POC, initial HEP Person educated: Patient Education method: Explanation, Demonstration, and Handouts Education comprehension: verbalized understanding, returned demonstration, and needs further education  HOME EXERCISE PROGRAM: Access Code: [email or web address] URL: [email or web address] Date: 03/12/2024 Prepared by: Gellen April Earnie Starring  Exercises - Standing Lumbar Extension  - 1 x daily - 7 x weekly - 1 sets - 10 reps - Supine Lower Trunk Rotation  - 1 x daily - 7 x weekly - 1 sets - 10 reps - 5 sec hold - Supine Double Knee to Chest  - 1 x daily - 7 x weekly - 2 sets - 30 sec hold - Supine Figure 4 Piriformis Stretch  - 1 x daily - 7 x weekly - 3 sets - 30 sec hold - Side Stepping with Resistance at Thighs and Counter Support  - 2-3 x weekly - 1-3 sets - 60 sec rounds hold - Backward Monster Walk with Resistance at [employer] and Counter Support  - 1-3 x daily - 2-3 x weekly - 2 sets - 10 reps - Seated Hip Internal Rotation with Ball and  Resistance  - 1 x daily - 2-3 x weekly - 3 sets - 10 reps - Hooklying Clamshell with Resistance  - 2-3 x weekly - 3 sets - 10 reps - Supine March with Resistance Band  - 2-3 x weekly - 3 sets - 10 reps - Supine Hamstring Stretch  - 1 x daily - 7 x weekly - 1 sets - 10-15 reps - Hooklying Isometric Hip Flexion  - 1 x daily - 7 x weekly - 1-3 sets - 10 reps - Barbell Front Squat  - 1 x daily - 7 x weekly - 3 sets - 10 reps - Kettlebell Suitcase Carry  - 1 x daily - 7 x weekly - 3 sets - 2 min rounds hold - Standing Anti-Rotation Press with Anchored Resistance  - 1 x daily - 7 x weekly - 3 sets - 10 reps - Hip Hiking on Step  - 1 x daily - 7 x weekly - 10 reps - 10 sec hold  Note: Objective measures were completed at Evaluation unless otherwise noted.  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  PALPATION: Tenderness and tightness notable in L lumbar paraspinal, QL, slightly in L glute max  MUSCLE LENGTH: Hamstrings: Right 75 deg; Left 62 deg SLR Thomas test: Did not assess Quads in prone: Right 97 deg; Left 95 deg FABER: L much tighter than R   POSTURE: L lateral hip shift, trunk flexed at pelvis with pt in anterior pelvic tilt  LOWER EXTREMITY ROM:     Passive  Right  Eval Left Eval  Hip flexion    Hip extension    Hip abduction    Hip adduction    Hip internal rotation 25 pulls on lower L side 35  Hip external rotation 35 30  Knee flexion    Knee extension    Ankle dorsiflexion    Ankle plantarflexion    Ankle inversion    Ankle eversion     (Blank rows = not tested)  LOWER EXTREMITY MMT:    MMT Right Eval Left Eval  Hip flexion 3+ 3+  Hip extension 2+ 2+  Hip abduction 3 3  Hip adduction    Hip internal rotation    Hip external rotation    Knee flexion 5 3+  Knee extension 5 4-  Ankle dorsiflexion    Ankle plantarflexion    Ankle inversion    Ankle eversion    (Blank  rows = not tested)  GAIT: Findings: Distance walked: Into clinic and Comments: Normal reciprocal pattern  FUNCTIONAL TESTS:  5x STS: 15.47 sec    PATIENT SURVEYS:  PSFS: THE PATIENT SPECIFIC FUNCTIONAL SCALE  Place score of 0-10 (0 = unable to perform activity and 10 = able to perform activity at the same level as before injury or problem)  Activity Date: 03/05/24    Walking 5    2.       3.     4.      Total Score 5      Total Score = Sum of activity scores/number of activities  Minimally Detectable Change: 3 points (for single activity); 2 points (for average score)  [facility] (nd). The Patient Specific Functional Scale . Retrieved from [email or web address]                                                                                                                                 GOALS: Goals reviewed with patient? Yes  SHORT TERM GOALS: Target date: 04/02/2024   Pt will be ind with initial HEP Baseline: Goal status: MET  2.  Pt will be able to cross L LE over R to demo increasing hip flexibility Baseline: Unable on eval; able Goal status: MET  3.  Pt will report improvement of tightness by >/=50% Baseline: more than 50% improved Goal status: MET   LONG TERM GOALS: Target date: 04/30/2024   Pt will be ind with management and progression of HEP Baseline:  Goal status: INITIAL  2.  Pt will have improved Berg Balance Score to >/=52/56 to demo MCID and decrease fall risk Baseline: 44/56 Goal status: INITIAL  3.  Pt will have improved 5x STS to </=13 sec to demo increased functional LE strength and decrease fall risk Baseline: 15.47 sec Goal status: INITIAL  4.  Pt will have improved PSFS score average to >/=7 Baseline: 5 Goal status: INITIAL  5.  Pt will demo L = R hip/LE mobility Baseline:  Goal  status: INITIAL   ASSESSMENT:  CLINICAL IMPRESSION: Notes improved symptoms and  decreased antalgic gait. LE strength to improve hip/core strength/power for stability.  NMR to enhance single limb support LLE. Manual therapy to increase ROM and decrease pain/guarding. Reports overall significant improvements with ability to actively cross LLE over RLE for donning shoes.   OBJECTIVE IMPAIRMENTS: Abnormal gait, decreased activity tolerance, decreased balance, decreased coordination, decreased endurance, decreased mobility, difficulty walking, decreased ROM, decreased strength, hypomobility, increased fascial restrictions, increased muscle spasms, impaired flexibility, improper body mechanics, postural dysfunction, and pain.    PLAN:  PT FREQUENCY: 2x/week  PT DURATION: 8 weeks  PLANNED INTERVENTIONS: 97164- PT Re-evaluation, 97750- Physical Performance Testing, 97110-Therapeutic exercises, 97530- Therapeutic activity, 97112- Neuromuscular re-education, 97535- Self Care, [id number]- Manual therapy, [phone]- Gait training, [phone]- Aquatic Therapy, [phone]- Electrical stimulation (unattended), [phone]- Traction (mechanical), [id number]- Ionotophoresis 4mg /ml Dexamethasone , 79439 (1-2 muscles), [id number] (3+ muscles)- Dry Needling, Patient/Family education, Balance training, Stair training, Taping, Joint mobilization, Spinal mobilization, Cryotherapy, and Moist heat  PLAN FOR NEXT SESSION: Assess response to HEP. Work on Chiropractor. Core and hip strengthening. Balance. Standing hip flexion/step over obstacles   8:38 AM, 04/04/24 Jonette MARLA Sandifer, PT, DPT Physical Therapist- [place] Office Number: [phone]

## 2024-04-09 ENCOUNTER — Ambulatory Visit

## 2024-04-09 DIAGNOSIS — S81811A Laceration without foreign body, right lower leg, initial encounter: Secondary | ICD-10-CM | POA: Diagnosis not present

## 2024-04-09 DIAGNOSIS — X58XXXA Exposure to other specified factors, initial encounter: Secondary | ICD-10-CM | POA: Diagnosis not present

## 2024-04-11 ENCOUNTER — Ambulatory Visit

## 2024-04-16 ENCOUNTER — Ambulatory Visit: Admitting: Physical Therapy

## 2024-04-18 ENCOUNTER — Ambulatory Visit

## 2024-04-20 DIAGNOSIS — I5032 Chronic diastolic (congestive) heart failure: Secondary | ICD-10-CM | POA: Diagnosis not present

## 2024-04-20 DIAGNOSIS — I2510 Atherosclerotic heart disease of native coronary artery without angina pectoris: Secondary | ICD-10-CM | POA: Diagnosis not present

## 2024-04-20 DIAGNOSIS — I130 Hypertensive heart and chronic kidney disease with heart failure and stage 1 through stage 4 chronic kidney disease, or unspecified chronic kidney disease: Secondary | ICD-10-CM | POA: Diagnosis not present

## 2024-04-20 DIAGNOSIS — N1831 Chronic kidney disease, stage 3a: Secondary | ICD-10-CM | POA: Diagnosis not present

## 2024-04-23 ENCOUNTER — Ambulatory Visit: Admitting: Physical Therapy

## 2024-04-25 ENCOUNTER — Ambulatory Visit: Admitting: Physical Therapy

## 2024-04-28 NOTE — Therapy (Signed)
 OUTPATIENT PHYSICAL THERAPY NEURO PROGRESS NOTE/RE-CERT   Patient Name: Daniel Yu MRN: [id number] DOB:13-May-1932, 89 y.o., male Today's Date: 04/29/2024   PCP: Charlott Dorn LABOR, MD REFERRING PROVIDER: Charlott Dorn LABOR, MD   Progress Note Reporting Period 03/05/24 to 04/29/24  See note below for Objective Data and Assessment of Progress/Goals.     END OF SESSION:  PT End of Session - 04/29/24 1652     Visit Number 10    Number of Visits 14    Date for PT Re-Evaluation 05/30/24    Authorization Type [employer]    Authorization Time Period approved 16 PT visits from 03/05/2024 - 04/30/2024    Authorization - Visit Number 10    Authorization - Number of Visits 16    Progress Note Due on Visit 10    PT Start Time 1619    PT Stop Time 1659    PT Time Calculation (min) 40 min    Equipment Utilized During Treatment Gait belt    Activity Tolerance Patient tolerated treatment well    Behavior During Therapy WFL for tasks assessed/performed            Past Medical History:  Diagnosis Date   Arthritis    right knee oa   Asthma    Carpal tunnel syndrome    BILATERAL   Cholecystitis    Complication of anesthesia    adverse reaction to the spinal   Hypertension    PAF (paroxysmal atrial fibrillation) (HCC)    SBO (small bowel obstruction) (HCC) 12/2015   Past Surgical History:  Procedure Laterality Date   CARDIOVERSION N/A 02/14/2017   Procedure: CARDIOVERSION;  Surgeon: Delford Maude BROCKS, MD;  Location: [facility] ENDOSCOPY;  Service: Cardiovascular;  Laterality: N/A;   CARDIOVERSION N/A 12/11/2019   Procedure: CARDIOVERSION;  Surgeon: Pietro Redell RAMAN, MD;  Location: [facility] ENDOSCOPY;  Service: Cardiovascular;  Laterality: N/A;   CARDIOVERSION N/A 02/13/2022   Procedure: CARDIOVERSION;  Surgeon: Pietro Redell RAMAN, MD;  Location: [facility] ENDOSCOPY;  Service: Cardiovascular;  Laterality: N/A;   CHOLECYSTECTOMY  02/09/2017   CHOLECYSTECTOMY N/A 02/09/2017   Procedure: LAPAROSCOPIC  CHOLECYSTECTOMY WITH INTRAOPERATIVE CHOLANGIOGRAM;  Surgeon: Belinda Cough, MD;  Location: [facility] OR;  Service: General;  Laterality: N/A;   COLONOSCOPY WITH PROPOFOL  N/A 11/09/2014   Procedure: COLONOSCOPY WITH PROPOFOL ;  Surgeon: Gladis MARLA Louder, MD;  Location: [facility] ENDOSCOPY;  Service: Endoscopy;  Laterality: N/A;   HERNIA REPAIR     2013   LEFT HEART CATH AND CORONARY ANGIOGRAPHY N/A 05/23/2022   Procedure: LEFT HEART CATH AND CORONARY ANGIOGRAPHY;  Surgeon: [place], Peter M, MD;  Location: [facility] INVASIVE CV LAB;  Service: Cardiovascular;  Laterality: N/A;   QUADRICEPS TENDON REPAIR Left 06/07/2015   Procedure: REPAIR LEFT QUADRICEP TENDON;  Surgeon: Dempsey Moan, MD;  Location: [facility] ORS;  Service: Orthopedics;  Laterality: Left;   TEE WITHOUT CARDIOVERSION N/A 02/14/2017   Procedure: TRANSESOPHAGEAL ECHOCARDIOGRAM (TEE);  Surgeon: Delford Maude BROCKS, MD;  Location: [facility] ENDOSCOPY;  Service: Cardiovascular;  Laterality: N/A;   TOTAL KNEE ARTHROPLASTY Right 12/27/2015   Procedure: RIGHT TOTAL KNEE ARTHROPLASTY;  Surgeon: Dempsey Moan, MD;  Location: [facility] ORS;  Service: Orthopedics;  Laterality: Right;   Patient Active Problem List   Diagnosis Date Noted   Orthostatic hypotension 08/13/2023   Syncope and collapse 08/12/2023   Chronic diastolic CHF (congestive heart failure) (HCC) 08/12/2023   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 coli) (HCC)    Acute systolic CHF (congestive heart failure) (HCC)    Paroxysmal atrial fibrillation (HCC)    Bacteremia due to Escherichia coli     Hypokalemia    Sepsis (HCC) 02/08/2017   Abnormal abdominal CT scan 02/08/2017   Asthma    Arthritis    SBO (small bowel obstruction) (HCC) 01/03/2016   Dehydration with hyponatremia 01/03/2016   S/P right knee surgery/TKA  01/03/2016   Acute hypokalemia 01/03/2016   Essential hypertension 01/03/2016   Acute hyperglycemia 01/03/2016   OA (osteoarthritis) of knee 12/27/2015   Rupture of left quadriceps tendon 06/07/2015   Quadriceps tendon rupture 06/07/2015    ONSET DATE: 2016  REFERRING DIAG: R29.6 (ICD-10-CM) - Repeated falls M54.50 (ICD-10-CM) - Low back pain, unspecified  THERAPY DIAG:  Other abnormalities of gait and mobility  Abnormal posture  Muscle weakness (generalized)  Stiffness of left hip, not elsewhere classified  Other low back pain  Rationale for Evaluation and Treatment: Rehabilitation  SUBJECTIVE:                                                                                                                                                                                             SUBJECTIVE STATEMENT: Bumped the L hand and it is bleeding- I'm on eliquis . Reports that he also had a fall while [place] the beach the [place] before labor day and sliced open his lower leg. Went to the ER for this. Patient reports that he would like to be able to tolerate 15-20 minutes of walking to be able to walk around the block. Patient reports that he feels that stiffness in his LB as limiting factor.    Pt accompanied by: self  PERTINENT HISTORY: [name], L quad repair 2016  PAIN:  Are you having pain? Yes: NPRS scale: no Pain location: L leg to low back Pain description: Stiffness Aggravating factors: Walking, getting in/out of shower using grab bars Relieving factors: nothing currently  PRECAUTIONS: Fall  RED FLAGS: None   WEIGHT BEARING RESTRICTIONS: No  FALLS: Has patient fallen in last 6 months? Yes. Number of falls 2 falls. 1 in Aug 11, 2023 -- BP dropped;  another in May 2025 coming in the door and fell backwards on  stoop  LIVING ENVIRONMENT: Lives with: lives with their spouse Lives in: House/apartment Stairs: Yes: Internal: 12 steps; bilateral but cannot reach both and External: 1 stoop steps; none Has following equipment at home: None  PLOF: Independent; likes to read, goes to [employer]  PATIENT GOALS: Improve L LE tightness/pain and walking  OBJECTIVE:     TODAY'S TREATMENT: 04/28/24 Activity Comments  Berg 46/56  5xSTS 12.77 sec    LOWER EXTREMITY ROM:     Active  Right 04/29/24 Left 04/29/24  Hip flexion    Hip extension    Hip abduction    Hip adduction    Hip internal rotation 31 23  Hip external rotation 30 19   (Blank rows = not tested)   PATIENT SURVEYS:  PSFS: THE PATIENT SPECIFIC FUNCTIONAL SCALE   Place score of 0-10 (0 = unable to perform activity and 10 = able to perform activity at the same level as before injury or problem)   Activity Date: 03/05/24 04/29/24     Walking 5  3-4     2.          3.        4.         Total Score 5 3-4         Total Score = Sum of activity scores/number of activities   Minimally Detectable Change: 3 points (for single activity); 2 points (for average score)   [facility] (nd). The Patient Specific Functional Scale . Retrieved from [email or web address]     [facility] PT Assessment - 04/29/24 0001       Berg Balance Test   Sit to Stand Able to stand without using hands and stabilize independently    Standing Unsupported Able to stand safely 2 minutes    Sitting with Back Unsupported but Feet Supported on Floor or Stool Able to sit safely and securely 2 minutes    Stand to Sit Sits safely with minimal use of hands    Transfers Able to transfer safely, minor use of hands    Standing Unsupported with Eyes Closed Able to stand 10 seconds with supervision    Standing Unsupported with Feet Together Able to place feet  together independently and stand for 1 minute with supervision    From Standing, Reach Forward with Outstretched Arm Can reach forward >12 cm safely (5)    From Standing Position, Pick up Object from Floor Able to pick up shoe safely and easily    From Standing Position, Turn to Look Behind Over each Shoulder Looks behind from both sides and weight shifts well    Turn 360 Degrees Able to turn 360 degrees safely but slowly    Standing Unsupported, Alternately Place Feet on Step/Stool Able to stand independently and safely and complete 8 steps in 20 seconds    Standing Unsupported, One Foot in Front Able to take small step independently and hold 30 seconds    Standing on One Leg Tries to lift leg/unable to hold 3 seconds but remains standing independently    Total Score 46             PATIENT EDUCATION: Education details: answered pt's questions on POC, edu on exam findings, progress towards goals and remaining impairments as they relate to ADLs; discussed returning back to [employer] for fitness  Person educated: Patient Education method: Explanation Education comprehension: verbalized understanding    HOME EXERCISE PROGRAM: Access Code: [email or web address] URL: [email or web address] Date: 03/12/2024 Prepared by: Gellen April  Earnie Starring  Exercises - Standing Lumbar Extension  - 1 x daily - 7 x weekly - 1 sets - 10 reps - Supine Lower Trunk Rotation  - 1 x daily - 7 x weekly - 1 sets - 10 reps - 5 sec hold - Supine Double Knee to Chest  - 1 x daily - 7 x weekly - 2 sets - 30 sec hold - Supine Figure 4 Piriformis Stretch  - 1 x daily - 7 x weekly - 3 sets - 30 sec hold - Side Stepping with Resistance at Thighs and Counter Support  - 2-3 x weekly - 1-3 sets - 60 sec rounds hold - Backward Monster Walk with Resistance at [employer] and Counter Support  - 1-3 x daily - 2-3 x weekly - 2 sets - 10 reps - Seated Hip Internal Rotation with Ball and Resistance  - 1 x daily - 2-3 x weekly -  3 sets - 10 reps - Hooklying Clamshell with Resistance  - 2-3 x weekly - 3 sets - 10 reps - Supine March with Resistance Band  - 2-3 x weekly - 3 sets - 10 reps - Supine Hamstring Stretch  - 1 x daily - 7 x weekly - 1 sets - 10-15 reps - Hooklying Isometric Hip Flexion  - 1 x daily - 7 x weekly - 1-3 sets - 10 reps - Barbell Front Squat  - 1 x daily - 7 x weekly - 3 sets - 10 reps - Kettlebell Suitcase Carry  - 1 x daily - 7 x weekly - 3 sets - 2 min rounds hold - Standing Anti-Rotation Press with Anchored Resistance  - 1 x daily - 7 x weekly - 3 sets - 10 reps - Hip Hiking on Step  - 1 x daily - 7 x weekly - 10 reps - 10 sec hold  Note: Objective measures were completed at Evaluation unless otherwise noted.  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  PALPATION: Tenderness and tightness notable in L lumbar paraspinal, QL, slightly in L glute max  MUSCLE LENGTH: Hamstrings: Right 75 deg; Left 62 deg SLR Thomas test: Did not assess Quads in prone: Right 97 deg; Left 95 deg FABER: L much tighter than R   POSTURE: L lateral hip shift, trunk flexed at pelvis with pt in anterior pelvic tilt  LOWER EXTREMITY ROM:     Passive  Right Eval Left Eval  Hip flexion    Hip extension    Hip abduction    Hip adduction    Hip internal rotation 25 pulls on lower L side 35  Hip external rotation 35 30  Knee flexion    Knee extension    Ankle dorsiflexion    Ankle plantarflexion    Ankle inversion    Ankle eversion     (Blank rows = not tested)  LOWER EXTREMITY MMT:    MMT Right Eval Left Eval  Hip flexion 3+ 3+  Hip extension 2+ 2+  Hip abduction 3 3  Hip adduction    Hip internal rotation    Hip external rotation    Knee flexion 5 3+  Knee extension 5 4-  Ankle dorsiflexion    Ankle plantarflexion    Ankle inversion    Ankle eversion    (Blank rows = not tested)  GAIT: Findings:  Distance walked: Into clinic and Comments: Normal reciprocal pattern  FUNCTIONAL TESTS:  5x STS:  15.47 sec  [facility] PT Assessment - 04/29/24 0001       Berg Balance Test   Sit to Stand Able to stand without using hands and stabilize independently    Standing Unsupported Able to stand safely 2 minutes    Sitting with Back Unsupported but Feet Supported on Floor or Stool Able to sit safely and securely 2 minutes    Stand to Sit Sits safely with minimal use of hands    Transfers Able to transfer safely, minor use of hands    Standing Unsupported with Eyes Closed Able to stand 10 seconds with supervision    Standing Unsupported with Feet Together Able to place feet together independently and stand for 1 minute with supervision    From Standing, Reach Forward with Outstretched Arm Can reach forward >12 cm safely (5)    From Standing Position, Pick up Object from Floor Able to pick up shoe safely and easily    From Standing Position, Turn to Look Behind Over each Shoulder Looks behind from both sides and weight shifts well    Turn 360 Degrees Able to turn 360 degrees safely but slowly    Standing Unsupported, Alternately Place Feet on Step/Stool Able to stand independently and safely and complete 8 steps in 20 seconds    Standing Unsupported, One Foot in Front Able to take small step independently and hold 30 seconds    Standing on One Leg Tries to lift leg/unable to hold 3 seconds but remains standing independently    Total Score 46           PATIENT SURVEYS:  PSFS: THE PATIENT SPECIFIC FUNCTIONAL SCALE  Place score of 0-10 (0 = unable to perform activity and 10 = able to perform activity at the same level as before injury or problem)  Activity Date: 03/05/24    Walking 5    2.       3.     4.      Total Score 5      Total Score = Sum of activity scores/number of activities  Minimally Detectable Change: 3 points (for single activity); 2 points (for average score)  [facility] (nd). The Patient Specific Functional Scale . Retrieved from [email or web address]                                                                                                                                 GOALS: Goals reviewed with patient? Yes  SHORT TERM GOALS: Target date: 04/02/2024   Pt will be ind with initial HEP Baseline: Goal status: MET  2.  Pt will be able to cross L LE over R to demo increasing hip flexibility Baseline: Unable on eval; able Goal status: MET  3.  Pt will report improvement of tightness by >/=50% Baseline: more than 50% improved Goal status: MET   LONG TERM GOALS: Target date: 04/30/2024   Pt will be  ind with management and progression of HEP Baseline: pt reports compliance 04/29/24  Goal status: IN PROGRESS 04/29/24  2.  Pt will have improved Berg Balance Score to >/=52/56 to demo MCID and decrease fall risk Baseline: 44/56; 46/56 04/29/24 Goal status: IN PROGRESS 04/29/24  3.  Pt will have improved 5x STS to </=13 sec to demo increased functional LE strength and decrease fall risk Baseline: 15.47 sec; 12.77 sec 04/29/24 Goal status: MET 04/29/24  4.  Pt will have improved PSFS score average to >/=7 Baseline: 5; 3-4 04/29/24 Goal status: IN PROGRESS 04/29/24  5.  Pt will demo L = R hip/LE mobility Baseline: see above- L hip still limited compared to R 04/29/24 Goal status: IN PROGRESS 04/29/24  6.  Pt to return back to [employer] for fitness regimen with modifications as needed.  Baseline: discussed today- pt has plans to return 04/29/24 Goal status: INITIAL 04/29/24   ASSESSMENT:  CLINICAL IMPRESSION: Patient arrived to session with report of a fall 2 weeks ago resulting in cutting his R LE. Patient scored 46/56 on Berg, demonstrating an improvement since initial assessment. Patient has met transfer speed goal via 5xSTS. Hip mobility remains more limited in L vs. R LE and patient notes LB  stiffness as limiting factor in walking. Patient is demonstrating slow but steady progress towards goals. Would benefit from additional skilled PT services 1x/week for 4 weeks to address remaining goals.   OBJECTIVE IMPAIRMENTS: Abnormal gait, decreased activity tolerance, decreased balance, decreased coordination, decreased endurance, decreased mobility, difficulty walking, decreased ROM, decreased strength, hypomobility, increased fascial restrictions, increased muscle spasms, impaired flexibility, improper body mechanics, postural dysfunction, and pain.    PLAN:  PT FREQUENCY: 1x/week  PT DURATION: 4 weeks  PLANNED INTERVENTIONS: 97164- PT Re-evaluation, 97750- Physical Performance Testing, 97110-Therapeutic exercises, 97530- Therapeutic activity, [id number]- Neuromuscular re-education, 97535- Self Care, [id number]- Manual therapy, [phone]- Gait training, [phone]- Aquatic Therapy, [phone]- Electrical stimulation (unattended), [phone]- Traction (mechanical), [id number]- Ionotophoresis 4mg /ml Dexamethasone , 79439 (1-2 muscles), [id number] (3+ muscles)- Dry Needling, Patient/Family education, Balance training, Stair training, Taping, Joint mobilization, Spinal mobilization, Cryotherapy, and Moist heat  PLAN FOR NEXT SESSION: discuss pt's former [employer] regimen and discuss possible modifications.  Assess response to HEP. Work on Chiropractor. Core and hip strengthening. Balance. Standing hip flexion/step over obstacles   Louana Terrilyn Christians, [place], DPT 04/29/24 5:09 PM  [facility] at [facility] [address] [place], [address] [place], [place] [address] Phone # [phone] Fax # [phone]

## 2024-04-29 ENCOUNTER — Encounter: Payer: Self-pay | Admitting: Physical Therapy

## 2024-04-29 ENCOUNTER — Ambulatory Visit: Attending: Internal Medicine | Admitting: Physical Therapy

## 2024-04-29 DIAGNOSIS — M6281 Muscle weakness (generalized): Secondary | ICD-10-CM | POA: Insufficient documentation

## 2024-04-29 DIAGNOSIS — R293 Abnormal posture: Secondary | ICD-10-CM | POA: Diagnosis not present

## 2024-04-29 DIAGNOSIS — M25652 Stiffness of left hip, not elsewhere classified: Secondary | ICD-10-CM | POA: Diagnosis not present

## 2024-04-29 DIAGNOSIS — M5459 Other low back pain: Secondary | ICD-10-CM | POA: Diagnosis not present

## 2024-04-29 DIAGNOSIS — R2689 Other abnormalities of gait and mobility: Secondary | ICD-10-CM | POA: Insufficient documentation

## 2024-04-30 ENCOUNTER — Ambulatory Visit: Admitting: Physical Therapy

## 2024-05-02 ENCOUNTER — Ambulatory Visit: Admitting: Physical Therapy

## 2024-05-05 NOTE — Therapy (Signed)
 OUTPATIENT PHYSICAL THERAPY NEURO NOTE   Patient Name: Daniel Yu MRN: [id number] DOB:27-Jun-1932, 89 y.o., male Today's Date: 05/06/2024   PCP: Charlott Dorn LABOR, MD REFERRING PROVIDER: Charlott Dorn LABOR, MD      END OF SESSION:  PT End of Session - 05/06/24 0806     Visit Number 11    Number of Visits 14    Date for PT Re-Evaluation 05/30/24    Authorization Type [employer]    Authorization Time Period Auth#: [id number] , approved 4 additional PT visits from 04/29/24-05/27/24    Authorization - Visit Number 1    Authorization - Number of Visits 4    Progress Note Due on Visit 10    PT Start Time 0805    PT Stop Time 0845    PT Time Calculation (min) 40 min    Equipment Utilized During Treatment --    Activity Tolerance Patient tolerated treatment well    Behavior During Therapy WFL for tasks assessed/performed             Past Medical History:  Diagnosis Date   Arthritis    right knee oa   Asthma    Carpal tunnel syndrome    BILATERAL   Cholecystitis    Complication of anesthesia    adverse reaction to the spinal   Hypertension    PAF (paroxysmal atrial fibrillation) (HCC)    SBO (small bowel obstruction) (HCC) 12/2015   Past Surgical History:  Procedure Laterality Date   CARDIOVERSION N/A 02/14/2017   Procedure: CARDIOVERSION;  Surgeon: Delford Maude BROCKS, MD;  Location: [facility] ENDOSCOPY;  Service: Cardiovascular;  Laterality: N/A;   CARDIOVERSION N/A 12/11/2019   Procedure: CARDIOVERSION;  Surgeon: Pietro Redell RAMAN, MD;  Location: [facility] ENDOSCOPY;  Service: Cardiovascular;  Laterality: N/A;   CARDIOVERSION N/A 02/13/2022   Procedure: CARDIOVERSION;  Surgeon: Pietro Redell RAMAN, MD;  Location: [facility] ENDOSCOPY;  Service: Cardiovascular;  Laterality: N/A;   CHOLECYSTECTOMY  02/09/2017   CHOLECYSTECTOMY N/A 02/09/2017   Procedure: LAPAROSCOPIC CHOLECYSTECTOMY WITH INTRAOPERATIVE CHOLANGIOGRAM;  Surgeon: Belinda Cough, MD;  Location: [facility] OR;  Service: General;  Laterality:  N/A;   COLONOSCOPY WITH PROPOFOL  N/A 11/09/2014   Procedure: COLONOSCOPY WITH PROPOFOL ;  Surgeon: Gladis MARLA Louder, MD;  Location: [facility] ENDOSCOPY;  Service: Endoscopy;  Laterality: N/A;   HERNIA REPAIR     2013   LEFT HEART CATH AND CORONARY ANGIOGRAPHY N/A 05/23/2022   Procedure: LEFT HEART CATH AND CORONARY ANGIOGRAPHY;  Surgeon: Swazil[place], Peter M, MD;  Location: [facility] INVASIVE CV LAB;  Service: Cardiovascular;  Laterality: N/A;   QUADRICEPS TENDON REPAIR Left 06/07/2015   Procedure: REPAIR LEFT QUADRICEP TENDON;  Surgeon: Dempsey Moan, MD;  Location: [facility] ORS;  Service: Orthopedics;  Laterality: Left;   TEE WITHOUT CARDIOVERSION N/A 02/14/2017   Procedure: TRANSESOPHAGEAL ECHOCARDIOGRAM (TEE);  Surgeon: Delford Maude BROCKS, MD;  Location: [facility] ENDOSCOPY;  Service: Cardiovascular;  Laterality: N/A;   TOTAL KNEE ARTHROPLASTY Right 12/27/2015   Procedure: RIGHT TOTAL KNEE ARTHROPLASTY;  Surgeon: Dempsey Moan, MD;  Location: [facility] ORS;  Service: Orthopedics;  Laterality: Right;   Patient Active Problem List   Diagnosis Date Noted   Orthostatic hypotension 08/13/2023   Syncope and collapse 08/12/2023   Chronic diastolic CHF (congestive heart failure) (HCC) 08/12/2023   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 coli) (HCC)    Acute systolic CHF (congestive heart failure) (HCC)    Paroxysmal atrial fibrillation (HCC)    Bacteremia due to Escherichia coli    Hypokalemia    Sepsis (HCC) 02/08/2017   Abnormal abdominal CT scan 02/08/2017   Asthma    Arthritis    SBO (small bowel  obstruction) (HCC) 01/03/2016   Dehydration with hyponatremia 01/03/2016   S/P right knee surgery/TKA  01/03/2016   Acute hypokalemia 01/03/2016   Essential hypertension 01/03/2016   Acute hyperglycemia 01/03/2016   OA (osteoarthritis) of knee 12/27/2015   Rupture of left quadriceps tendon 06/07/2015   Quadriceps tendon rupture 06/07/2015    ONSET DATE: 2016  REFERRING DIAG: R29.6 (ICD-10-CM) - Repeated falls M54.50 (ICD-10-CM) - Low back pain, unspecified  THERAPY DIAG:  Other abnormalities of gait and mobility  Abnormal posture  Muscle weakness (generalized)  Stiffness of left hip, not elsewhere classified  Other low back pain  Rationale for Evaluation and Treatment: Rehabilitation  SUBJECTIVE:                                                                                                                                                                                             SUBJECTIVE STATEMENT: Went to [facility] after last session and rode the bike. Also did the weight machines without any weight. After 2 days of this he had stiffness in his back and today it feels a heck of a lot better. Upon discussion patient was performing resisted trunk flexion (crunch movement) which he believes flared up his back.    Pt accompanied by: self  PERTINENT HISTORY: [name], L quad repair 2016  PAIN:  Are you having pain? Yes: NPRS scale: no Pain location: L leg to low back Pain description: Stiffness Aggravating factors: Walking, getting in/out of shower using grab bars Relieving factors: nothing currently  PRECAUTIONS: Fall  RED FLAGS: None   WEIGHT BEARING RESTRICTIONS: No  FALLS: Has patient fallen in last 6 months? Yes. Number of falls 2 falls. 1 in Aug 11, 2023 -- BP dropped; another in May 2025 coming in the door and fell backwards on stoop  LIVING ENVIRONMENT: Lives with: lives with their spouse Lives in: House/apartment Stairs: Yes: Internal: 12 steps;  bilateral but cannot reach both and External: 1 stoop steps; none Has following equipment at home: None  PLOF: Independent; likes to read, goes to [facility]  PATIENT GOALS: Improve L LE tightness/pain and walking  OBJECTIVE:     TODAY'S TREATMENT: 05/06/24 Activity Comments  Hookyling stretching tp address stiffness: LTR  Fig 4 KTOS Cues to avoid pushing into pain, avoiding valsalva   Bridge 10x  Cueing for glute contraction before each rep. Slightly windswept to R side  Pelvic tilts  Verbal and tactile cueing to perform properly  Hookylying overhead medball raise  Requires cueing to avoid valsalva  Weighted pulley: Paloff press 10x each side 10# Rows  2x10 20# Pt requires CGA anf heavy cueing to address trunk flexion and subsequent LB discomfort with rows. 2nd set performed in sitting which revealed better form and safety   Prayer stretch with green pball  To address LB tension. Pt reports better already        PATIENT EDUCATION: Education details: discussed giving pt at least 2 days in between gym workouts. Edu on why resisted crunches could be exacerbating LBP and options to strengthen core in other ways ; HEP update Person educated: Patient Education method: Explanation, Demonstration, Tactile cues, Verbal cues, and Handouts Education comprehension: verbalized understanding and returned demonstration     HOME EXERCISE PROGRAM: Access Code: [email or web address] URL: [email or web address] Date: 05/06/2024 Prepared by: [facility] - Outpatient  Rehab - [facility]  Exercises - Standing Lumbar Extension  - 1 x daily - 7 x weekly - 1 sets - 10 reps - Supine Lower Trunk Rotation  - 1 x daily - 7 x weekly - 1 sets - 10 reps - 5 sec hold - Supine Double Knee to Chest  - 1 x daily - 7 x weekly - 2 sets - 30 sec hold - Supine Figure 4 Piriformis Stretch  - 1 x daily - 7 x weekly - 3 sets - 30 sec hold - Side Stepping with Resistance at Thighs and Counter Support  - 2-3  x weekly - 1-3 sets - 60 sec rounds hold - Backward Monster Walk with Resistance at [employer] and Counter Support  - 1-3 x daily - 2-3 x weekly - 2 sets - 10 reps - Seated Hip Internal Rotation with Ball and Resistance  - 1 x daily - 2-3 x weekly - 3 sets - 10 reps - Hooklying Clamshell with Resistance  - 2-3 x weekly - 3 sets - 10 reps - Supine March with Resistance Band  - 2-3 x weekly - 3 sets - 10 reps - Supine Hamstring Stretch  - 1 x daily - 7 x weekly - 1 sets - 10-15 reps - Hooklying Isometric Hip Flexion  - 1 x daily - 7 x weekly - 1-3 sets - 10 reps - Barbell Front Squat  - 1 x daily - 7 x weekly - 3 sets - 10 reps - Kettlebell Suitcase Carry  - 1 x daily - 7 x weekly - 3 sets - 2 min rounds hold - Standing Anti-Rotation Press with Anchored Resistance  - 1 x daily - 7 x weekly - 3 sets - 10 reps - Hip Hiking on Step  - 1 x daily - 7 x weekly - 10 reps - 10 sec hold - Supine Bridge  - 1 x daily - 5 x weekly - 2 sets - 10 reps - Seated Row Cable Machine  - 1 x daily - 5 x weekly - 2 sets - 10 reps - Supine Posterior Pelvic Tilt  + overhead lift- 1 x daily - 5 x  weekly - 2 sets - 10 reps    Note: Objective measures were completed at Evaluation unless otherwise noted.  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  PALPATION: Tenderness and tightness notable in L lumbar paraspinal, QL, slightly in L glute max  MUSCLE LENGTH: Hamstrings: Right 75 deg; Left 62 deg SLR Thomas test: Did not assess Quads in prone: Right 97 deg; Left 95 deg FABER: L much tighter than R   POSTURE: L lateral hip shift, trunk flexed at pelvis with pt in anterior pelvic tilt  LOWER EXTREMITY ROM:     Passive  Right Eval Left Eval  Hip flexion    Hip extension    Hip abduction    Hip adduction    Hip internal rotation 25 pulls on lower L side 35  Hip external rotation 35 30  Knee flexion    Knee extension     Ankle dorsiflexion    Ankle plantarflexion    Ankle inversion    Ankle eversion     (Blank rows = not tested)  LOWER EXTREMITY MMT:    MMT Right Eval Left Eval  Hip flexion 3+ 3+  Hip extension 2+ 2+  Hip abduction 3 3  Hip adduction    Hip internal rotation    Hip external rotation    Knee flexion 5 3+  Knee extension 5 4-  Ankle dorsiflexion    Ankle plantarflexion    Ankle inversion    Ankle eversion    (Blank rows = not tested)  GAIT: Findings: Distance walked: Into clinic and Comments: Normal reciprocal pattern  FUNCTIONAL TESTS:  5x STS: 15.47 sec     PATIENT SURVEYS:  PSFS: THE PATIENT SPECIFIC FUNCTIONAL SCALE  Place score of 0-10 (0 = unable to perform activity and 10 = able to perform activity at the same level as before injury or problem)  Activity Date: 03/05/24    Walking 5    2.       3.     4.      Total Score 5      Total Score = Sum of activity scores/number of activities  Minimally Detectable Change: 3 points (for single activity); 2 points (for average score)  [facility] (nd). The Patient Specific Functional Scale . Retrieved from [email or web address]                                                                                                                                 GOALS: Goals reviewed with patient? Yes  SHORT TERM GOALS: Target date: 04/02/2024   Pt will be ind with initial HEP Baseline: Goal status: MET  2.  Pt will be able to cross L LE over R to demo increasing hip flexibility Baseline: Unable on eval; able Goal status: MET  3.  Pt will report improvement of tightness by >/=50% Baseline: more  than 50% improved Goal status: MET   LONG TERM GOALS: Target date: 04/30/2024   Pt will be ind with management and progression of HEP Baseline: pt reports compliance 04/29/24  Goal status: IN PROGRESS 04/29/24  2.  Pt will have improved Berg Balance  Score to >/=52/56 to demo MCID and decrease fall risk Baseline: 44/56; 46/56 04/29/24 Goal status: IN PROGRESS 04/29/24  3.  Pt will have improved 5x STS to </=13 sec to demo increased functional LE strength and decrease fall risk Baseline: 15.47 sec; 12.77 sec 04/29/24 Goal status: MET 04/29/24  4.  Pt will have improved PSFS score average to >/=7 Baseline: 5; 3-4 04/29/24 Goal status: IN PROGRESS 04/29/24  5.  Pt will demo L = R hip/LE mobility Baseline: see above- L hip still limited compared to R 04/29/24 Goal status: IN PROGRESS 04/29/24  6.  Pt to return back to [facility] for fitness regimen with modifications as needed.  Baseline: discussed today- pt has plans to return 04/29/24 Goal status: INITIAL 04/29/24   ASSESSMENT:  CLINICAL IMPRESSION: Patient arrived to session with report of returning to the gym for 2 days before discontinuing d/t exacerbation of LB stiffness. Upon discussion, patient was performing repeated resisted flexion, likely causing flare. Session focused on core strengthening activities while maintaining neutral spine to effectively strengthen without exacerbation of LBP. These activities were updated into HEP and patient reported "better already" after completing todays activities.   OBJECTIVE IMPAIRMENTS: Abnormal gait, decreased activity tolerance, decreased balance, decreased coordination, decreased endurance, decreased mobility, difficulty walking, decreased ROM, decreased strength, hypomobility, increased fascial restrictions, increased muscle spasms, impaired flexibility, improper body mechanics, postural dysfunction, and pain.    PLAN:  PT FREQUENCY: 1x/week  PT DURATION: 4 weeks  PLANNED INTERVENTIONS: 97164- PT Re-evaluation, 97750- Physical Performance Testing, 97110-Therapeutic exercises, 97530- Therapeutic activity, [id number]- Neuromuscular re-education, 97535- Self Care, 02859- Manual therapy, [phone]- Gait training, [phone]- Aquatic Therapy, [phone]- Electrical  stimulation (unattended), [phone]- Traction (mechanical), [id number]- Ionotophoresis 4mg /ml Dexamethasone , 79439 (1-2 muscles), [id number] (3+ muscles)- Dry Needling, Patient/Family education, Balance training, Stair training, Taping, Joint mobilization, Spinal mobilization, Cryotherapy, and Moist heat  PLAN FOR NEXT SESSION:  how did [facility] go?  Assess response to HEP. Work on Chiropractor. Core and hip strengthening. Balance. Standing hip flexion/step over obstacles   Louana Terrilyn Christians, [place], DPT 05/06/24 8:46 AM  [facility] at [facility] [address], [address] [place], [place] [address] Phone # [phone] Fax # [phone]

## 2024-05-06 ENCOUNTER — Encounter: Payer: Self-pay | Admitting: Physical Therapy

## 2024-05-06 ENCOUNTER — Ambulatory Visit: Admitting: Physical Therapy

## 2024-05-06 DIAGNOSIS — M6281 Muscle weakness (generalized): Secondary | ICD-10-CM

## 2024-05-06 DIAGNOSIS — R2689 Other abnormalities of gait and mobility: Secondary | ICD-10-CM

## 2024-05-06 DIAGNOSIS — M5459 Other low back pain: Secondary | ICD-10-CM | POA: Diagnosis not present

## 2024-05-06 DIAGNOSIS — M25652 Stiffness of left hip, not elsewhere classified: Secondary | ICD-10-CM | POA: Diagnosis not present

## 2024-05-06 DIAGNOSIS — R293 Abnormal posture: Secondary | ICD-10-CM

## 2024-05-14 ENCOUNTER — Encounter: Payer: Self-pay | Admitting: Physical Therapy

## 2024-05-14 ENCOUNTER — Ambulatory Visit: Admitting: Physical Therapy

## 2024-05-14 DIAGNOSIS — R293 Abnormal posture: Secondary | ICD-10-CM | POA: Diagnosis not present

## 2024-05-14 DIAGNOSIS — M25652 Stiffness of left hip, not elsewhere classified: Secondary | ICD-10-CM | POA: Diagnosis not present

## 2024-05-14 DIAGNOSIS — M5459 Other low back pain: Secondary | ICD-10-CM | POA: Diagnosis not present

## 2024-05-14 DIAGNOSIS — M6281 Muscle weakness (generalized): Secondary | ICD-10-CM

## 2024-05-14 DIAGNOSIS — R2689 Other abnormalities of gait and mobility: Secondary | ICD-10-CM

## 2024-05-14 NOTE — Therapy (Signed)
 OUTPATIENT PHYSICAL THERAPY NEURO NOTE   Patient Name: Daniel Yu MRN: [id number] DOB:1931-12-12, 89 y.o., male Today's Date: 05/14/2024   PCP: Charlott Dorn LABOR, MD REFERRING PROVIDER: Charlott Dorn LABOR, MD      END OF SESSION:  PT End of Session - 05/14/24 1025     Visit Number 12    Number of Visits 14    Date for Recertification  05/30/24    Authorization Type [employer]    Authorization Time Period Auth#: [id number] , approved 4 additional PT visits from 04/29/24-05/27/24    Authorization - Visit Number 2    Authorization - Number of Visits 4    Progress Note Due on Visit 10    PT Start Time 1024    PT Stop Time 1102    PT Time Calculation (min) 38 min    Equipment Utilized During Treatment Gait belt    Activity Tolerance Patient tolerated treatment well    Behavior During Therapy WFL for tasks assessed/performed              Past Medical History:  Diagnosis Date   Arthritis    right knee oa   Asthma    Carpal tunnel syndrome    BILATERAL   Cholecystitis    Complication of anesthesia    adverse reaction to the spinal   Hypertension    PAF (paroxysmal atrial fibrillation) (HCC)    SBO (small bowel obstruction) (HCC) 12/2015   Past Surgical History:  Procedure Laterality Date   CARDIOVERSION N/A 02/14/2017   Procedure: CARDIOVERSION;  Surgeon: Delford Maude BROCKS, MD;  Location: [facility] ENDOSCOPY;  Service: Cardiovascular;  Laterality: N/A;   CARDIOVERSION N/A 12/11/2019   Procedure: CARDIOVERSION;  Surgeon: Pietro Redell RAMAN, MD;  Location: [facility] ENDOSCOPY;  Service: Cardiovascular;  Laterality: N/A;   CARDIOVERSION N/A 02/13/2022   Procedure: CARDIOVERSION;  Surgeon: Pietro Redell RAMAN, MD;  Location: [facility] ENDOSCOPY;  Service: Cardiovascular;  Laterality: N/A;   CHOLECYSTECTOMY  02/09/2017   CHOLECYSTECTOMY N/A 02/09/2017   Procedure: LAPAROSCOPIC CHOLECYSTECTOMY WITH INTRAOPERATIVE CHOLANGIOGRAM;  Surgeon: Belinda Cough, MD;  Location: [facility] OR;  Service: General;   Laterality: N/A;   COLONOSCOPY WITH PROPOFOL  N/A 11/09/2014   Procedure: COLONOSCOPY WITH PROPOFOL ;  Surgeon: Gladis MARLA Louder, MD;  Location: [facility] ENDOSCOPY;  Service: Endoscopy;  Laterality: N/A;   HERNIA REPAIR     2013   LEFT HEART CATH AND CORONARY ANGIOGRAPHY N/A 05/23/2022   Procedure: LEFT HEART CATH AND CORONARY ANGIOGRAPHY;  Surgeon: [place], Peter M, MD;  Location: [facility] INVASIVE CV LAB;  Service: Cardiovascular;  Laterality: N/A;   QUADRICEPS TENDON REPAIR Left 06/07/2015   Procedure: REPAIR LEFT QUADRICEP TENDON;  Surgeon: Dempsey Moan, MD;  Location: [facility] ORS;  Service: Orthopedics;  Laterality: Left;   TEE WITHOUT CARDIOVERSION N/A 02/14/2017   Procedure: TRANSESOPHAGEAL ECHOCARDIOGRAM (TEE);  Surgeon: Delford Maude BROCKS, MD;  Location: [facility] ENDOSCOPY;  Service: Cardiovascular;  Laterality: N/A;   TOTAL KNEE ARTHROPLASTY Right 12/27/2015   Procedure: RIGHT TOTAL KNEE ARTHROPLASTY;  Surgeon: Dempsey Moan, MD;  Location: [facility] ORS;  Service: Orthopedics;  Laterality: Right;   Patient Active Problem List   Diagnosis Date Noted   Orthostatic hypotension 08/13/2023   Syncope and collapse 08/12/2023   Chronic diastolic CHF (congestive heart failure) (HCC) 08/12/2023   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 coli) (HCC)    Acute systolic CHF (congestive heart failure) (HCC)    Paroxysmal atrial fibrillation (HCC)    Bacteremia due to Escherichia coli    Hypokalemia    Sepsis (HCC) 02/08/2017   Abnormal abdominal CT scan 02/08/2017   Asthma    Arthritis    SBO (small  bowel obstruction) (HCC) 01/03/2016   Dehydration with hyponatremia 01/03/2016   S/P right knee surgery/TKA  01/03/2016   Acute hypokalemia 01/03/2016   Essential hypertension 01/03/2016   Acute hyperglycemia 01/03/2016   OA (osteoarthritis) of knee 12/27/2015   Rupture of left quadriceps tendon 06/07/2015   Quadriceps tendon rupture 06/07/2015    ONSET DATE: 2016  REFERRING DIAG: R29.6 (ICD-10-CM) - Repeated falls M54.50 (ICD-10-CM) - Low back pain, unspecified  THERAPY DIAG:  Muscle weakness (generalized)  Abnormal posture  Other abnormalities of gait and mobility  Rationale for Evaluation and Treatment: Rehabilitation  SUBJECTIVE:                                                                                                                                                                                             SUBJECTIVE STATEMENT: Had a fall in the yard this weekend tripped over a hose and couldn't get up. Had to have wife and neighbor get me up. Going to [facility], and trying to have 1-2 days in between. (Haven't been back since seeing [facility] last week)  Pt accompanied by: self  PERTINENT HISTORY: [name], L quad repair 2016  PAIN:  Are you having pain? Yes: NPRS scale: no Pain location: L leg to low back Pain description: Stiffness Aggravating factors: Walking, getting in/out of shower using grab bars Relieving factors: nothing currently  PRECAUTIONS: Fall  RED FLAGS: None   WEIGHT BEARING RESTRICTIONS: No  FALLS: Has patient fallen in last 6 months? Yes. Number of falls 2 falls. 1 in Aug 11, 2023 -- BP dropped; another in May 2025 coming in the door and fell backwards on stoop  LIVING ENVIRONMENT: Lives with: lives with their spouse Lives in: House/apartment Stairs: Yes: Internal: 12 steps; bilateral but cannot reach both and External: 1 stoop steps; none Has following equipment at home: None  PLOF: Independent; likes to read, goes to [facility]  PATIENT  GOALS: Improve L LE tightness/pain and walking  OBJECTIVE:     TODAY'S TREATMENT: 05/14/24  Activity Comments  Hookyling stretching to address stiffness: LTR  Fig 4 KTOS Cues to avoid pushing into pain, avoiding valsalva  3 reps 15-30 sec     Pelvic tilts-supine Reviewed from recent addition to HEP-good return demo  Seated pelvic tilt, 2 x 5 reps Cues for technique     Prayer stretch with chair in front To address LB tension. Pt reports good stretch  Forward step up/opposite leg lift, 2 x 10 reps BUE support  Wall squats with therapy ball behind back 3 x 5 reps  Gait with single walking pole, 50 ft x 2, including stepping over low obstacles (hula hoop) Pt has decreased foot clearance with obstacles, needs cues for pace and sequence with walking pole     PATIENT EDUCATION: Education details:reviewed HEP update; discussed potential benefits of walking pole use for outdoor gait (given recent fall outdoors tripping over hose) Person educated: Patient Education method: Explanation, Demonstration, Tactile cues, Verbal cues, and Handouts Education comprehension: verbalized understanding and returned demonstration     HOME EXERCISE PROGRAM: Access Code: [email or web address] URL: [email or web address] Date: 05/06/2024 Prepared by: [facility] - Outpatient  Rehab - [facility]  Exercises - Standing Lumbar Extension  - 1 x daily - 7 x weekly - 1 sets - 10 reps - Supine Lower Trunk Rotation  - 1 x daily - 7 x weekly - 1 sets - 10 reps - 5 sec hold - Supine Double Knee to Chest  - 1 x daily - 7 x weekly - 2 sets - 30 sec hold - Supine Figure 4 Piriformis Stretch  - 1 x daily - 7 x weekly - 3 sets - 30 sec hold - Side Stepping with Resistance at Thighs and Counter Support  - 2-3 x weekly - 1-3 sets - 60 sec rounds hold - Backward Monster Walk with Resistance at [employer] and Counter Support  - 1-3 x daily - 2-3 x weekly - 2 sets - 10 reps - Seated Hip Internal Rotation with Ball and  Resistance  - 1 x daily - 2-3 x weekly - 3 sets - 10 reps - Hooklying Clamshell with Resistance  - 2-3 x weekly - 3 sets - 10 reps - Supine March with Resistance Band  - 2-3 x weekly - 3 sets - 10 reps - Supine Hamstring Stretch  - 1 x daily - 7 x weekly - 1 sets - 10-15 reps - Hooklying Isometric Hip Flexion  - 1 x daily - 7 x weekly - 1-3 sets - 10 reps - Barbell Front Squat  - 1 x daily - 7 x weekly - 3 sets - 10 reps - Kettlebell Suitcase Carry  - 1 x daily - 7 x weekly - 3 sets - 2 min rounds hold - Standing Anti-Rotation Press with Anchored Resistance  - 1 x daily - 7 x weekly - 3 sets - 10 reps - Hip Hiking on Step  - 1 x daily - 7 x weekly - 10 reps - 10 sec hold - Supine Bridge  - 1 x daily - 5 x weekly - 2 sets - 10 reps - Seated Row Cable Machine  - 1 x daily - 5 x weekly - 2 sets - 10 reps - Supine Posterior Pelvic Tilt  + overhead lift- 1 x daily - 5 x weekly - 2 sets - 10 reps    Note: Objective measures were completed at Evaluation unless otherwise noted.  DIAGNOSTIC FINDINGS:  Lumbar x-ray 12/24/23 IMPRESSION: Multilevel moderate to  severe degenerative joint changes of lumbar spine.  PALPATION: Tenderness and tightness notable in L lumbar paraspinal, QL, slightly in L glute max  MUSCLE LENGTH: Hamstrings: Right 75 deg; Left 62 deg SLR Thomas test: Did not assess Quads in prone: Right 97 deg; Left 95 deg FABER: L much tighter than R   POSTURE: L lateral hip shift, trunk flexed at pelvis with pt in anterior pelvic tilt  LOWER EXTREMITY ROM:     Passive  Right Eval Left Eval  Hip flexion    Hip extension    Hip abduction    Hip adduction    Hip internal rotation 25 pulls on lower L side 35  Hip external rotation 35 30  Knee flexion    Knee extension    Ankle dorsiflexion    Ankle plantarflexion    Ankle inversion    Ankle eversion     (Blank rows = not tested)  LOWER EXTREMITY MMT:    MMT Right Eval Left Eval  Hip flexion 3+ 3+  Hip extension 2+ 2+   Hip abduction 3 3  Hip adduction    Hip internal rotation    Hip external rotation    Knee flexion 5 3+  Knee extension 5 4-  Ankle dorsiflexion    Ankle plantarflexion    Ankle inversion    Ankle eversion    (Blank rows = not tested)  GAIT: Findings: Distance walked: Into clinic and Comments: Normal reciprocal pattern  FUNCTIONAL TESTS:  5x STS: 15.47 sec     PATIENT SURVEYS:  PSFS: THE PATIENT SPECIFIC FUNCTIONAL SCALE  Place score of 0-10 (0 = unable to perform activity and 10 = able to perform activity at the same level as before injury or problem)  Activity Date: 03/05/24    Walking 5    2.       3.     4.      Total Score 5      Total Score = Sum of activity scores/number of activities  Minimally Detectable Change: 3 points (for single activity); 2 points (for average score)  [facility] (nd). The Patient Specific Functional Scale . Retrieved from [email or web address]                                                                                                                                 GOALS: Goals reviewed with patient? Yes  SHORT TERM GOALS: Target date: 04/02/2024   Pt will be ind with initial HEP Baseline: Goal status: MET  2.  Pt will be able to cross L LE over R to demo increasing hip flexibility Baseline: Unable on eval; able Goal status: MET  3.  Pt will report improvement of tightness by >/=50% Baseline: more than 50% improved Goal status: MET   LONG TERM GOALS: Target date: 04/30/2024>05/30/2024 (updated POC/recert)   Pt will be ind with management and progression of HEP Baseline: pt reports  compliance 04/29/24  Goal status: IN PROGRESS 04/29/24  2.  Pt will have improved Berg Balance Score to >/=52/56 to demo MCID and decrease fall risk Baseline: 44/56; 46/56 04/29/24 Goal status: IN PROGRESS 04/29/24  3.  Pt will have improved 5x STS to </=13 sec to demo increased  functional LE strength and decrease fall risk Baseline: 15.47 sec; 12.77 sec 04/29/24 Goal status: MET 04/29/24  4.  Pt will have improved PSFS score average to >/=7 Baseline: 5; 3-4 04/29/24 Goal status: IN PROGRESS 04/29/24  5.  Pt will demo L = R hip/LE mobility Baseline: see above- L hip still limited compared to R 04/29/24 Goal status: IN PROGRESS 04/29/24  6.  Pt to return back to [facility] for fitness regimen with modifications as needed.  Baseline: discussed today- pt has plans to return 04/29/24 Goal status: INITIAL 04/29/24   ASSESSMENT:  CLINICAL IMPRESSION: Pt presents today with reports of a fall over the weekend-tripping over hose in the yard and couldn't get up. Skilled PT session focused on review of  low back exercises and then strengthening for BLEs. Pt tolerates well the step ups and wall squats for strengthening, with extra time for cues for correct technique.  Discussed and briefly trialed use of single walking pole for potential of outdoor gait. Pt does have difficulty with sequencing and still has decreased foot clearance with walking pole trial, but may benefit from additional practice to aid in safety with outdoor gait.  Pt has no complaints upon leaving.   OBJECTIVE IMPAIRMENTS: Abnormal gait, decreased activity tolerance, decreased balance, decreased coordination, decreased endurance, decreased mobility, difficulty walking, decreased ROM, decreased strength, hypomobility, increased fascial restrictions, increased muscle spasms, impaired flexibility, improper body mechanics, postural dysfunction, and pain.    PLAN:  PT FREQUENCY: 1x/week  PT DURATION: 4 weeks  PLANNED INTERVENTIONS: 97164- PT Re-evaluation, 97750- Physical Performance Testing, 97110-Therapeutic exercises, 97530- Therapeutic activity, [id number]- Neuromuscular re-education, 97535- Self Care, [id number]- Manual therapy, [phone]- Gait training, [phone]- Aquatic Therapy, [phone]- Electrical stimulation (unattended), [phone]-  Traction (mechanical), [id number]- Ionotophoresis 4mg /ml Dexamethasone , 79439 (1-2 muscles), 20561 (3+ muscles)- Dry Needling, Patient/Family education, Balance training, Stair training, Taping, Joint mobilization, Spinal mobilization, Cryotherapy, and Moist heat  PLAN FOR NEXT SESSION:  Ask about how did [facility] go? Another trial with walking pole-try outdoors and obstacles Work on hip/lumbar mobility/stretching. Core and hip strengthening. Balance. Standing hip flexion/step over obstacles   Greig Anon, PT 05/14/24 12:35 PM Phone: [phone] Fax: [phone]  [facility] at [facility] [address] [place], [address] [place], [place] [address] Phone # [phone] Fax # [phone]

## 2024-05-19 ENCOUNTER — Telehealth: Payer: Self-pay

## 2024-05-19 NOTE — Telephone Encounter (Signed)
 The patient's appointment has been rescheduled in accordance with the provider's updated office hours. A reminder letter has been sent to inform the patient of the new appointment date.

## 2024-05-20 DIAGNOSIS — N1831 Chronic kidney disease, stage 3a: Secondary | ICD-10-CM | POA: Diagnosis not present

## 2024-05-20 DIAGNOSIS — I130 Hypertensive heart and chronic kidney disease with heart failure and stage 1 through stage 4 chronic kidney disease, or unspecified chronic kidney disease: Secondary | ICD-10-CM | POA: Diagnosis not present

## 2024-05-20 DIAGNOSIS — I5032 Chronic diastolic (congestive) heart failure: Secondary | ICD-10-CM | POA: Diagnosis not present

## 2024-05-20 DIAGNOSIS — I2510 Atherosclerotic heart disease of native coronary artery without angina pectoris: Secondary | ICD-10-CM | POA: Diagnosis not present

## 2024-05-21 ENCOUNTER — Ambulatory Visit: Attending: Internal Medicine

## 2024-05-21 DIAGNOSIS — R2689 Other abnormalities of gait and mobility: Secondary | ICD-10-CM | POA: Diagnosis present

## 2024-05-21 DIAGNOSIS — M25652 Stiffness of left hip, not elsewhere classified: Secondary | ICD-10-CM | POA: Insufficient documentation

## 2024-05-21 DIAGNOSIS — R293 Abnormal posture: Secondary | ICD-10-CM | POA: Insufficient documentation

## 2024-05-21 DIAGNOSIS — M6281 Muscle weakness (generalized): Secondary | ICD-10-CM | POA: Insufficient documentation

## 2024-05-21 NOTE — Therapy (Signed)
 OUTPATIENT PHYSICAL THERAPY NEURO NOTE   Patient Name: RAYVON DAKIN MRN: [id number] DOB:[birth year], 89 y.o., male Today's Date: 05/21/2024   PCP: Charlott Dorn LABOR, MD REFERRING PROVIDER: Charlott Dorn LABOR, MD      END OF SESSION:  PT End of Session - 05/21/24 1311     Visit Number 13    Number of Visits 14    Date for Recertification  05/30/24    Authorization Type [employer]    Authorization Time Period Auth#: [id number] , approved 4 additional PT visits from 04/29/24-05/27/24    Authorization - Visit Number 3    Authorization - Number of Visits 4    Progress Note Due on Visit 10    PT Start Time 1315    PT Stop Time 1400    PT Time Calculation (min) 45 min    Equipment Utilized During Treatment Gait belt    Activity Tolerance Patient tolerated treatment well    Behavior During Therapy WFL for tasks assessed/performed              Past Medical History:  Diagnosis Date   Arthritis    right knee oa   Asthma    Carpal tunnel syndrome    BILATERAL   Cholecystitis    Complication of anesthesia    adverse reaction to the spinal   Hypertension    PAF (paroxysmal atrial fibrillation) (HCC)    SBO (small bowel obstruction) (HCC) 12/2015   Past Surgical History:  Procedure Laterality Date   CARDIOVERSION N/A 02/14/2017   Procedure: CARDIOVERSION;  Surgeon: Delford Maude BROCKS, MD;  Location: [facility] ENDOSCOPY;  Service: Cardiovascular;  Laterality: N/A;   CARDIOVERSION N/A 12/11/2019   Procedure: CARDIOVERSION;  Surgeon: Pietro Redell RAMAN, MD;  Location: [facility] ENDOSCOPY;  Service: Cardiovascular;  Laterality: N/A;   CARDIOVERSION N/A 02/13/2022   Procedure: CARDIOVERSION;  Surgeon: Pietro Redell RAMAN, MD;  Location: [facility] ENDOSCOPY;  Service: Cardiovascular;  Laterality: N/A;   CHOLECYSTECTOMY  02/09/2017   CHOLECYSTECTOMY N/A 02/09/2017   Procedure: LAPAROSCOPIC CHOLECYSTECTOMY WITH INTRAOPERATIVE CHOLANGIOGRAM;  Surgeon: Belinda Cough, MD;  Location: [facility] OR;  Service: General;   Laterality: N/A;   COLONOSCOPY WITH PROPOFOL  N/A 11/09/2014   Procedure: COLONOSCOPY WITH PROPOFOL ;  Surgeon: Gladis MARLA Louder, MD;  Location: [facility] ENDOSCOPY;  Service: Endoscopy;  Laterality: N/A;   HERNIA REPAIR     2013   LEFT HEART CATH AND CORONARY ANGIOGRAPHY N/A 05/23/2022   Procedure: LEFT HEART CATH AND CORONARY ANGIOGRAPHY;  Surgeon: Swazil[place], Peter M, MD;  Location: [facility] INVASIVE CV LAB;  Service: Cardiovascular;  Laterality: N/A;   QUADRICEPS TENDON REPAIR Left 06/07/2015   Procedure: REPAIR LEFT QUADRICEP TENDON;  Surgeon: Dempsey Moan, MD;  Location: [facility] ORS;  Service: Orthopedics;  Laterality: Left;   TEE WITHOUT CARDIOVERSION N/A 02/14/2017   Procedure: TRANSESOPHAGEAL ECHOCARDIOGRAM (TEE);  Surgeon: Delford Maude BROCKS, MD;  Location: [facility] ENDOSCOPY;  Service: Cardiovascular;  Laterality: N/A;   TOTAL KNEE ARTHROPLASTY Right 12/27/2015   Procedure: RIGHT TOTAL KNEE ARTHROPLASTY;  Surgeon: Dempsey Moan, MD;  Location: [facility] ORS;  Service: Orthopedics;  Laterality: Right;   Patient Active Problem List   Diagnosis Date Noted   Orthostatic hypotension 08/13/2023   Syncope and collapse 08/12/2023   Chronic diastolic CHF (congestive heart failure) (HCC) 08/12/2023   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 coli) (HCC)    Acute systolic CHF (congestive heart failure) (HCC)    Paroxysmal atrial fibrillation (HCC)    Bacteremia due to Escherichia coli    Hypokalemia    Sepsis (HCC) 02/08/2017   Abnormal abdominal CT scan 02/08/2017   Asthma    Arthritis    SBO (small  bowel obstruction) (HCC) 01/03/2016   Dehydration with hyponatremia 01/03/2016   S/P right knee surgery/TKA  01/03/2016   Acute hypokalemia 01/03/2016   Essential hypertension 01/03/2016   Acute hyperglycemia 01/03/2016   OA (osteoarthritis) of knee 12/27/2015   Rupture of left quadriceps tendon 06/07/2015   Quadriceps tendon rupture 06/07/2015    ONSET DATE: 2016  REFERRING DIAG: R29.6 (ICD-10-CM) - Repeated falls M54.50 (ICD-10-CM) - Low back pain, unspecified  THERAPY DIAG:  Muscle weakness (generalized)  Abnormal posture  Other abnormalities of gait and mobility  Stiffness of left hip, not elsewhere classified  Rationale for Evaluation and Treatment: Rehabilitation  SUBJECTIVE:                                                                                                                                                                                             SUBJECTIVE STATEMENT: Had a fall in the yard this weekend tripped over a hose and couldn't get up. Had to have wife and neighbor get me up. Going to [facility], and trying to have 1-2 days in between. (Haven't been back since seeing [facility] last week)  Pt accompanied by: self  PERTINENT HISTORY: [name], L quad repair 2016  PAIN:  Are you having pain? Yes: NPRS scale: no Pain location: L leg to low back Pain description: Stiffness Aggravating factors: Walking, getting in/out of shower using grab bars Relieving factors: nothing currently  PRECAUTIONS: Fall  RED FLAGS: None   WEIGHT BEARING RESTRICTIONS: No  FALLS: Has patient fallen in last 6 months? Yes. Number of falls 2 falls. 1 in Aug 11, 2023 -- BP dropped; another in May 2025 coming in the door and fell backwards on stoop  LIVING ENVIRONMENT: Lives with: lives with their spouse Lives in: House/apartment Stairs: Yes: Internal: 12 steps; bilateral but cannot reach both and External: 1 stoop steps; none Has following equipment at home: None  PLOF:  Independent; likes to read, goes to [employer]  PATIENT GOALS: Improve L LE tightness/pain and walking  OBJECTIVE:   TODAY'S TREATMENT: 05/21/24 Activity Comments  Floor to stand/fall recovery Trials of quadruped to half kneeling/lunge Attempt at reverse inch-worm Reverse lunge w/ BUE support  Gait training Trials outdoors w/ trekking pole various surfaces--cues for stride length and sequence -uneven surfaces w/ obstacles and trekking pole -4-square step w/ pole for single limb support -gait w/ trekking pole level surfaces                  TODAY'S TREATMENT: 05/14/24 Activity Comments  Hookyling stretching to address stiffness: LTR  Fig 4 KTOS Cues to avoid pushing into pain, avoiding valsalva  3 reps 15-30 sec     Pelvic tilts-supine Reviewed from recent addition to HEP-good return demo  Seated pelvic tilt, 2 x 5 reps Cues for technique     Prayer stretch with chair in front To address LB tension. Pt reports good stretch  Forward step up/opposite leg lift, 2 x 10 reps BUE support  Wall squats with therapy ball behind back 3 x 5 reps  Gait with single walking pole, 50 ft x 2, including stepping over low obstacles (hula hoop) Pt has decreased foot clearance with obstacles, needs cues for pace and sequence with walking pole     PATIENT EDUCATION: Education details:reviewed HEP update; discussed potential benefits of walking pole use for outdoor gait (given recent fall outdoors tripping over hose) Person educated: Patient Education method: Explanation, Demonstration, Tactile cues, Verbal cues, and Handouts Education comprehension: verbalized understanding and returned demonstration     HOME EXERCISE PROGRAM: Access Code: [email or web address] URL: [email or web address] Date: 05/06/2024 Prepared by: [facility] - Outpatient  Rehab - [facility]  Exercises - Standing Lumbar Extension  - 1 x daily - 7 x weekly - 1 sets - 10 reps - Supine Lower Trunk Rotation  - 1 x  daily - 7 x weekly - 1 sets - 10 reps - 5 sec hold - Supine Double Knee to Chest  - 1 x daily - 7 x weekly - 2 sets - 30 sec hold - Supine Figure 4 Piriformis Stretch  - 1 x daily - 7 x weekly - 3 sets - 30 sec hold - Side Stepping with Resistance at Thighs and Counter Support  - 2-3 x weekly - 1-3 sets - 60 sec rounds hold - Backward Monster Walk with Resistance at [employer] and Counter Support  - 1-3 x daily - 2-3 x weekly - 2 sets - 10 reps - Seated Hip Internal Rotation with Ball and Resistance  - 1 x daily - 2-3 x weekly - 3 sets - 10 reps - Hooklying Clamshell with Resistance  - 2-3 x weekly - 3 sets - 10 reps - Supine March with Resistance Band  - 2-3 x weekly - 3 sets - 10 reps - Supine Hamstring Stretch  - 1 x daily - 7 x weekly - 1 sets - 10-15 reps - Hooklying Isometric Hip Flexion  - 1 x daily - 7 x weekly - 1-3 sets - 10 reps - Barbell Front Squat  - 1 x daily - 7 x weekly - 3 sets - 10 reps - Kettlebell Suitcase Carry  - 1 x daily - 7 x weekly - 3 sets - 2 min rounds hold - Standing Anti-Rotation Press with Anchored Resistance  - 1 x daily - 7 x weekly - 3 sets - 10 reps - Hip Hiking on Step  - 1 x daily - 7 x weekly - 10 reps - 10 sec hold -  Supine Bridge  - 1 x daily - 5 x weekly - 2 sets - 10 reps - Seated Row Cable Machine  - 1 x daily - 5 x weekly - 2 sets - 10 reps - Supine Posterior Pelvic Tilt  + overhead lift- 1 x daily - 5 x weekly - 2 sets - 10 reps - Reverse Lunge  - 2-3 x weekly - 3 sets - 5 reps    Note: Objective measures were completed at Evaluation unless otherwise noted.  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  PALPATION: Tenderness and tightness notable in L lumbar paraspinal, QL, slightly in L glute max  MUSCLE LENGTH: Hamstrings: Right 75 deg; Left 62 deg SLR Thomas test: Did not assess Quads in prone: Right 97 deg; Left 95 deg FABER: L much tighter than  R   POSTURE: L lateral hip shift, trunk flexed at pelvis with pt in anterior pelvic tilt  LOWER EXTREMITY ROM:     Passive  Right Eval Left Eval  Hip flexion    Hip extension    Hip abduction    Hip adduction    Hip internal rotation 25 pulls on lower L side 35  Hip external rotation 35 30  Knee flexion    Knee extension    Ankle dorsiflexion    Ankle plantarflexion    Ankle inversion    Ankle eversion     (Blank rows = not tested)  LOWER EXTREMITY MMT:    MMT Right Eval Left Eval  Hip flexion 3+ 3+  Hip extension 2+ 2+  Hip abduction 3 3  Hip adduction    Hip internal rotation    Hip external rotation    Knee flexion 5 3+  Knee extension 5 4-  Ankle dorsiflexion    Ankle plantarflexion    Ankle inversion    Ankle eversion    (Blank rows = not tested)  GAIT: Findings: Distance walked: Into clinic and Comments: Normal reciprocal pattern  FUNCTIONAL TESTS:  5x STS: 15.47 sec     PATIENT SURVEYS:  PSFS: THE PATIENT SPECIFIC FUNCTIONAL SCALE  Place score of 0-10 (0 = unable to perform activity and 10 = able to perform activity at the same level as before injury or problem)  Activity Date: 03/05/24    Walking 5    2.       3.     4.      Total Score 5      Total Score = Sum of activity scores/number of activities  Minimally Detectable Change: 3 points (for single activity); 2 points (for average score)  [facility] (nd). The Patient Specific Functional Scale . Retrieved from [email or web address]  GOALS: Goals reviewed with patient? Yes  SHORT TERM GOALS: Target date: 04/02/2024   Pt will be ind with initial HEP Baseline: Goal status: MET  2.  Pt will be able to cross L LE over R to demo increasing hip flexibility Baseline: Unable on eval; able Goal  status: MET  3.  Pt will report improvement of tightness by >/=50% Baseline: more than 50% improved Goal status: MET   LONG TERM GOALS: Target date: 04/30/2024>05/30/2024 (updated POC/recert)   Pt will be ind with management and progression of HEP Baseline: pt reports compliance 04/29/24  Goal status: IN PROGRESS 04/29/24  2.  Pt will have improved Berg Balance Score to >/=52/56 to demo MCID and decrease fall risk Baseline: 44/56; 46/56 04/29/24 Goal status: IN PROGRESS 04/29/24  3.  Pt will have improved 5x STS to </=13 sec to demo increased functional LE strength and decrease fall risk Baseline: 15.47 sec; 12.77 sec 04/29/24 Goal status: MET 04/29/24  4.  Pt will have improved PSFS score average to >/=7 Baseline: 5; 3-4 04/29/24 Goal status: IN PROGRESS 04/29/24  5.  Pt will demo L = R hip/LE mobility Baseline: see above- L hip still limited compared to R 04/29/24 Goal status: IN PROGRESS 04/29/24  6.  Pt to return back to [facility] for fitness regimen with modifications as needed.  Baseline: discussed today- pt has plans to return 04/29/24 Goal status: INITIAL 04/29/24   ASSESSMENT:  CLINICAL IMPRESSION: Trials of fall recovery for floor to stand transfers with difficulty in quadruped position from hx of left knee quad repair and tenderness to site. Difficulty with sequencing to tall kneeling/lunge ultimately requiring min-mod A for transfer. Gait training to improve safety with ambulation and emphasis on obstacle negotiation w/ good carryover and safety using trekking pole with some need for compensation of circumduction vs flexion of hip for clearance but improved stability w/ AD. Continued sessions to advance POC details  OBJECTIVE IMPAIRMENTS: Abnormal gait, decreased activity tolerance, decreased balance, decreased coordination, decreased endurance, decreased mobility, difficulty walking, decreased ROM, decreased strength, hypomobility, increased fascial restrictions, increased muscle spasms,  impaired flexibility, improper body mechanics, postural dysfunction, and pain.    PLAN:  PT FREQUENCY: 1x/week  PT DURATION: 4 weeks  PLANNED INTERVENTIONS: 97164- PT Re-evaluation, 97750- Physical Performance Testing, 97110-Therapeutic exercises, 97530- Therapeutic activity, [id number]- Neuromuscular re-education, 97535- Self Care, [id number]- Manual therapy, [phone]- Gait training, [phone]- Aquatic Therapy, [phone]- Electrical stimulation (unattended), [phone]- Traction (mechanical), [id number]- Ionotophoresis 4mg /ml Dexamethasone , 79439 (1-2 muscles), [id number] (3+ muscles)- Dry Needling, Patient/Family education, Balance training, Stair training, Taping, Joint mobilization, Spinal mobilization, Cryotherapy, and Moist heat  PLAN FOR NEXT SESSION:  Ask about how did [facility] go? Another trial with walking pole-try outdoors and obstacles Work on hip/lumbar mobility/stretching. Core and hip strengthening. Balance. Standing hip flexion/step over obstacles   1:59 PM, 05/21/24 M. Kelly Bernadene Garside, PT, DPT Physical Therapist- [place] Office Number: [phone]

## 2024-05-26 ENCOUNTER — Ambulatory Visit: Admitting: Physical Therapy

## 2024-05-27 ENCOUNTER — Ambulatory Visit

## 2024-05-27 DIAGNOSIS — M25652 Stiffness of left hip, not elsewhere classified: Secondary | ICD-10-CM

## 2024-05-27 DIAGNOSIS — M6281 Muscle weakness (generalized): Secondary | ICD-10-CM | POA: Diagnosis not present

## 2024-05-27 DIAGNOSIS — R2689 Other abnormalities of gait and mobility: Secondary | ICD-10-CM

## 2024-05-27 DIAGNOSIS — R293 Abnormal posture: Secondary | ICD-10-CM

## 2024-05-27 NOTE — Therapy (Signed)
 OUTPATIENT PHYSICAL THERAPY NEURO NOTE and D/C Summary   Patient Name: Daniel Yu MRN: [id number] DOB:1931/12/09, 89 y.o., male Today's Date: 05/27/2024   PCP: Charlott Dorn LABOR, MD REFERRING PROVIDER: Charlott Dorn LABOR, MD   PHYSICAL THERAPY DISCHARGE SUMMARY  Visits from Start of Care: 14  Current functional level related to goals / functional outcomes: Goals met/partially met. Improved Berg Balance Test score from 44 (high risk for falls) to 49/56 (low risk for falls)   Remaining deficits: Some ROM restriction to left hip   Education / Equipment: HEP   Patient agrees to discharge. Patient goals were partially met. Patient is being discharged due to being pleased with the current functional level.    END OF SESSION:  PT End of Session - 05/27/24 1534     Visit Number 14    Number of Visits 14    Date for Recertification  05/30/24    Authorization Type [employer]    Authorization Time Period Auth#: [id number] , approved 4 additional PT visits from 04/29/24-05/27/24    Authorization - Visit Number 4    Authorization - Number of Visits 4    Progress Note Due on Visit 10    PT Start Time 1530    PT Stop Time 1615    PT Time Calculation (min) 45 min    Equipment Utilized During Treatment Gait belt    Activity Tolerance Patient tolerated treatment well    Behavior During Therapy WFL for tasks assessed/performed              Past Medical History:  Diagnosis Date   Arthritis    right knee oa   Asthma    Carpal tunnel syndrome    BILATERAL   Cholecystitis    Complication of anesthesia    adverse reaction to the spinal   Hypertension    PAF (paroxysmal atrial fibrillation) (HCC)    SBO (small bowel obstruction) (HCC) 12/2015   Past Surgical History:  Procedure Laterality Date   CARDIOVERSION N/A 02/14/2017   Procedure: CARDIOVERSION;  Surgeon: Delford Maude BROCKS, MD;  Location: [facility] ENDOSCOPY;  Service: Cardiovascular;  Laterality: N/A;   CARDIOVERSION N/A  12/11/2019   Procedure: CARDIOVERSION;  Surgeon: Pietro Redell RAMAN, MD;  Location: [facility] ENDOSCOPY;  Service: Cardiovascular;  Laterality: N/A;   CARDIOVERSION N/A 02/13/2022   Procedure: CARDIOVERSION;  Surgeon: Pietro Redell RAMAN, MD;  Location: [facility] ENDOSCOPY;  Service: Cardiovascular;  Laterality: N/A;   CHOLECYSTECTOMY  02/09/2017   CHOLECYSTECTOMY N/A 02/09/2017   Procedure: LAPAROSCOPIC CHOLECYSTECTOMY WITH INTRAOPERATIVE CHOLANGIOGRAM;  Surgeon: Belinda Cough, MD;  Location: [facility] OR;  Service: General;  Laterality: N/A;   COLONOSCOPY WITH PROPOFOL  N/A 11/09/2014   Procedure: COLONOSCOPY WITH PROPOFOL ;  Surgeon: Gladis MARLA Louder, MD;  Location: [facility] ENDOSCOPY;  Service: Endoscopy;  Laterality: N/A;   HERNIA REPAIR     2013   LEFT HEART CATH AND CORONARY ANGIOGRAPHY N/A 05/23/2022   Procedure: LEFT HEART CATH AND CORONARY ANGIOGRAPHY;  Surgeon: [place], Peter M, MD;  Location: [facility] INVASIVE CV LAB;  Service: Cardiovascular;  Laterality: N/A;   QUADRICEPS TENDON REPAIR Left 06/07/2015   Procedure: REPAIR LEFT QUADRICEP TENDON;  Surgeon: Dempsey Moan, MD;  Location: [facility] ORS;  Service: Orthopedics;  Laterality: Left;   TEE WITHOUT CARDIOVERSION N/A 02/14/2017   Procedure: TRANSESOPHAGEAL ECHOCARDIOGRAM (TEE);  Surgeon: Delford Maude BROCKS, MD;  Location: [facility] ENDOSCOPY;  Service: Cardiovascular;  Laterality: N/A;   TOTAL KNEE ARTHROPLASTY Right 12/27/2015   Procedure: RIGHT TOTAL KNEE ARTHROPLASTY;  Surgeon:  Dempsey Moan, MD;  Location: [facility] ORS;  Service: Orthopedics;  Laterality: Right;   Patient Active Problem List   Diagnosis Date Noted   Orthostatic hypotension 08/13/2023   Syncope and collapse 08/12/2023   Chronic diastolic CHF (congestive heart failure) (HCC) 08/12/2023   Cardiac amyloidosis (HCC) 08/12/2023   Chronic kidney disease, stage 3a (HCC) 08/12/2023   Nasal bones, closed fracture 08/12/2023   DOE (dyspnea on exertion)    AKI (acute kidney injury)    CAD in native artery    Acute exacerbation  of CHF (congestive heart failure) (HCC) 06/04/2022   Abnormal nuclear stress test    Acute cholecystitis 02/15/2017   Atrial fibrillation, new onset (HCC) 02/15/2017   Elevated troponin 02/15/2017   Sepsis due to Escherichia coli (E. coli) (HCC)    Acute systolic CHF (congestive heart failure) (HCC)    Paroxysmal atrial fibrillation (HCC)    Bacteremia due to Escherichia coli    Hypokalemia    Sepsis (HCC) 02/08/2017   Abnormal abdominal CT scan 02/08/2017   Asthma    Arthritis    SBO (small bowel obstruction) (HCC) 01/03/2016   Dehydration with hyponatremia 01/03/2016   S/P right knee surgery/TKA  01/03/2016   Acute hypokalemia 01/03/2016   Essential hypertension 01/03/2016   Acute hyperglycemia 01/03/2016   OA (osteoarthritis) of knee 12/27/2015   Rupture of left quadriceps tendon 06/07/2015   Quadriceps tendon rupture 06/07/2015    ONSET DATE: 2016  REFERRING DIAG: R29.6 (ICD-10-CM) - Repeated falls M54.50 (ICD-10-CM) - Low back pain, unspecified  THERAPY DIAG:  Muscle weakness (generalized)  Abnormal posture  Other abnormalities of gait and mobility  Stiffness of left hip, not elsewhere classified  Rationale for Evaluation and Treatment: Rehabilitation  SUBJECTIVE:                                                                                                                                                                                             SUBJECTIVE STATEMENT: Had a fall in the yard this weekend tripped over a hose and couldn't get up. Had to have wife and neighbor get me up. Going to [facility], and trying to have 1-2 days in between. (Haven't been back since seeing [facility] last week)  Pt accompanied by: self  PERTINENT HISTORY: [name], L quad repair 2016  PAIN:  Are you having pain? Yes: NPRS scale: no Pain location: L leg to low back Pain description: Stiffness Aggravating factors: Walking, getting in/out of shower using grab bars Relieving factors:  nothing currently  PRECAUTIONS: Fall  RED FLAGS: None   WEIGHT BEARING RESTRICTIONS: No  FALLS: Has patient fallen  in last 6 months? Yes. Number of falls 2 falls. 1 in Aug 11, 2023 -- BP dropped; another in May 2025 coming in the door and fell backwards on stoop  LIVING ENVIRONMENT: Lives with: lives with their spouse Lives in: House/apartment Stairs: Yes: Internal: 12 steps; bilateral but cannot reach both and External: 1 stoop steps; none Has following equipment at home: None  PLOF: Independent; likes to read, goes to [employer]  PATIENT GOALS: Improve L LE tightness/pain and walking  OBJECTIVE:   TODAY'S TREATMENT: 05/27/24 Activity Comments  POC performance/review   HEP review Good recall w/ printed materials for reference.                 TODAY'S TREATMENT: 05/21/24 Activity Comments  Floor to stand/fall recovery Trials of quadruped to half kneeling/lunge Attempt at reverse inch-worm Reverse lunge w/ BUE support  Gait training Trials outdoors w/ trekking pole various surfaces--cues for stride length and sequence -uneven surfaces w/ obstacles and trekking pole -4-square step w/ pole for single limb support -gait w/ trekking pole level surfaces                      PATIENT EDUCATION: Education details:reviewed HEP update; discussed potential benefits of walking pole use for outdoor gait (given recent fall outdoors tripping over hose) Person educated: Patient Education method: Explanation, Demonstration, Tactile cues, Verbal cues, and Handouts Education comprehension: verbalized understanding and returned demonstration     HOME EXERCISE PROGRAM: Access Code: [email or web address] URL: [email or web address] Date: 05/06/2024 Prepared by: [facility] - Outpatient  Rehab - [facility]  Exercises - Standing Lumbar Extension  - 1 x daily - 7 x weekly - 1 sets - 10 reps - Supine Lower Trunk Rotation  - 1 x daily - 7 x weekly - 1 sets - 10 reps - 5 sec  hold - Supine Double Knee to Chest  - 1 x daily - 7 x weekly - 2 sets - 30 sec hold - Supine Figure 4 Piriformis Stretch  - 1 x daily - 7 x weekly - 3 sets - 30 sec hold - Side Stepping with Resistance at Thighs and Counter Support  - 2-3 x weekly - 1-3 sets - 60 sec rounds hold - Backward Monster Walk with Resistance at [employer] and Counter Support  - 1-3 x daily - 2-3 x weekly - 2 sets - 10 reps - Seated Hip Internal Rotation with Ball and Resistance  - 1 x daily - 2-3 x weekly - 3 sets - 10 reps - Hooklying Clamshell with Resistance  - 2-3 x weekly - 3 sets - 10 reps - Supine March with Resistance Band  - 2-3 x weekly - 3 sets - 10 reps - Supine Hamstring Stretch  - 1 x daily - 7 x weekly - 1 sets - 10-15 reps - Hooklying Isometric Hip Flexion  - 1 x daily - 7 x weekly - 1-3 sets - 10 reps - Barbell Front Squat  - 1 x daily - 7 x weekly - 3 sets - 10 reps - Kettlebell Suitcase Carry  - 1 x daily - 7 x weekly - 3 sets - 2 min rounds hold - Standing Anti-Rotation Press with Anchored Resistance  - 1 x daily - 7 x weekly - 3 sets - 10 reps - Hip Hiking on Step  - 1 x daily - 7 x weekly - 10 reps - 10 sec hold - Supine Bridge  - 1 x daily - 5  x weekly - 2 sets - 10 reps - Seated Row Cable Machine  - 1 x daily - 5 x weekly - 2 sets - 10 reps - Supine Posterior Pelvic Tilt  + overhead lift- 1 x daily - 5 x weekly - 2 sets - 10 reps - Reverse Lunge  - 2-3 x weekly - 3 sets - 5 reps    Note: Objective measures were completed at Evaluation unless otherwise noted.  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  PALPATION: Tenderness and tightness notable in L lumbar paraspinal, QL, slightly in L glute max  MUSCLE LENGTH: Hamstrings: Right 75 deg; Left 62 deg SLR Thomas test: Did not assess Quads in prone: Right 97 deg; Left 95 deg FABER: L much tighter than R   POSTURE: L lateral hip shift, trunk flexed at  pelvis with pt in anterior pelvic tilt  LOWER EXTREMITY ROM:     Passive  Right Eval Left Eval  Hip flexion    Hip extension    Hip abduction    Hip adduction    Hip internal rotation 25 pulls on lower L side 35  Hip external rotation 35 30  Knee flexion    Knee extension    Ankle dorsiflexion    Ankle plantarflexion    Ankle inversion    Ankle eversion     (Blank rows = not tested)  LOWER EXTREMITY MMT:    MMT Right Eval Left Eval  Hip flexion 3+ 3+  Hip extension 2+ 2+  Hip abduction 3 3  Hip adduction    Hip internal rotation    Hip external rotation    Knee flexion 5 3+  Knee extension 5 4-  Ankle dorsiflexion    Ankle plantarflexion    Ankle inversion    Ankle eversion    (Blank rows = not tested)  GAIT: Findings: Distance walked: Into clinic and Comments: Normal reciprocal pattern  FUNCTIONAL TESTS:  5x STS: 15.47 sec     PATIENT SURVEYS:  PSFS: THE PATIENT SPECIFIC FUNCTIONAL SCALE  Place score of 0-10 (0 = unable to perform activity and 10 = able to perform activity at the same level as before injury or problem)  Activity Date: 03/05/24    Walking 5    2.       3.     4.      Total Score 5      Total Score = Sum of activity scores/number of activities  Minimally Detectable Change: 3 points (for single activity); 2 points (for average score)  [facility] (nd). The Patient Specific Functional Scale . Retrieved from [email or web address]                                                                                                                                 GOALS: Goals reviewed with patient? Yes  SHORT  TERM GOALS: Target date: 04/02/2024   Pt will be ind with initial HEP Baseline: Goal status: MET  2.  Pt will be able to cross L LE over R to demo increasing hip flexibility Baseline: Unable on eval; able Goal status: MET  3.  Pt will report improvement of  tightness by >/=50% Baseline: more than 50% improved Goal status: MET   LONG TERM GOALS: Target date: 04/30/2024>05/30/2024 (updated POC/recert)   Pt will be ind with management and progression of HEP Baseline: pt reports compliance 04/29/24  Goal status: MET  2.  Pt will have improved Berg Balance Score to >/=52/56 to demo MCID and decrease fall risk Baseline: 44/56; 46/56; 49/56 Goal status: NOT MET  3.  Pt will have improved 5x STS to </=13 sec to demo increased functional LE strength and decrease fall risk Baseline: 15.47 sec; 12.77 sec 04/29/24 Goal status: MET 04/29/24  4.  Pt will have improved PSFS score average to >/=7 Baseline: 5; 3-4; 8 Goal status: MET  5.  Pt will demo L = R hip/LE mobility Baseline: see above- L hip still limited compared to R but able to cross legs for ADL Goal status: MET  6.  Pt to return back to [facility] for fitness regimen with modifications as needed.  Baseline: discussed today- pt has plans to return 04/29/24 Goal status:MET   ASSESSMENT:  CLINICAL IMPRESSION: POC performance/review with improved mobility per self-report from baseline 5 PSFS to 8/10. ROM slight improveent with ability AAROM cross leg position LLE for ADL performance. Improved performance Berg Balance test from baseline 44 to 49/56 indicating low risk for falls. Demo good recall and performance to HEP with emphasis on balance, mobility/ROM, and fall recovery techniques.  Reports overall decrease in pain/guarding and has initiated community-based exercise at local facility. Will D/C to HEP at this time.  OBJECTIVE IMPAIRMENTS: Abnormal gait, decreased activity tolerance, decreased balance, decreased coordination, decreased endurance, decreased mobility, difficulty walking, decreased ROM, decreased strength, hypomobility, increased fascial restrictions, increased muscle spasms, impaired flexibility, improper body mechanics, postural dysfunction, and pain.    PLAN:  PT FREQUENCY:  1x/week  PT DURATION: 4 weeks  PLANNED INTERVENTIONS: 97164- PT Re-evaluation, 97750- Physical Performance Testing, 97110-Therapeutic exercises, 97530- Therapeutic activity, [id number]- Neuromuscular re-education, 97535- Self Care, [id number]- Manual therapy, [phone]- Gait training, [phone]- Aquatic Therapy, [phone]- Electrical stimulation (unattended), [phone]- Traction (mechanical), [id number]- Ionotophoresis 4mg /ml Dexamethasone , 79439 (1-2 muscles), 20561 (3+ muscles)- Dry Needling, Patient/Family education, Balance training, Stair training, Taping, Joint mobilization, Spinal mobilization, Cryotherapy, and Moist heat  PLAN FOR NEXT SESSION:  Ask about how did [facility] go? Another trial with walking pole-try outdoors and obstacles Work on hip/lumbar mobility/stretching. Core and hip strengthening. Balance. Standing hip flexion/step over obstacles   3:34 PM, 05/27/24 M. Kelly Teddi Badalamenti, PT, DPT Physical Therapist- [place] Office Number: [phone]

## 2024-05-28 ENCOUNTER — Ambulatory Visit: Admitting: Physical Therapy

## 2024-05-28 ENCOUNTER — Ambulatory Visit

## 2024-06-06 ENCOUNTER — Other Ambulatory Visit (HOSPITAL_BASED_OUTPATIENT_CLINIC_OR_DEPARTMENT_OTHER): Payer: Self-pay

## 2024-06-06 MED ORDER — FLUZONE HIGH-DOSE 0.5 ML IM SUSY
0.5000 mL | PREFILLED_SYRINGE | Freq: Once | INTRAMUSCULAR | 0 refills | Status: AC
  Filled 2024-06-06: qty 0.5, 1d supply, fill #0

## 2024-06-09 ENCOUNTER — Other Ambulatory Visit: Payer: Self-pay

## 2024-06-10 MED ORDER — SPIRONOLACTONE 25 MG PO TABS: 25.0000 mg | ORAL_TABLET | Freq: Every day | ORAL | 2 refills | Status: AC

## 2024-06-11 ENCOUNTER — Other Ambulatory Visit (HOSPITAL_BASED_OUTPATIENT_CLINIC_OR_DEPARTMENT_OTHER): Payer: Self-pay

## 2024-06-11 MED ORDER — COMIRNATY 30 MCG/0.3ML IM SUSY
0.3000 mL | PREFILLED_SYRINGE | Freq: Once | INTRAMUSCULAR | 0 refills | Status: AC
  Filled 2024-06-11: qty 0.3, 1d supply, fill #0

## 2024-06-18 DIAGNOSIS — R310 Gross hematuria: Secondary | ICD-10-CM | POA: Diagnosis not present

## 2024-06-18 DIAGNOSIS — R3914 Feeling of incomplete bladder emptying: Secondary | ICD-10-CM | POA: Diagnosis not present

## 2024-06-18 DIAGNOSIS — R3912 Poor urinary stream: Secondary | ICD-10-CM | POA: Diagnosis not present

## 2024-06-18 DIAGNOSIS — R350 Frequency of micturition: Secondary | ICD-10-CM | POA: Diagnosis not present

## 2024-06-24 ENCOUNTER — Other Ambulatory Visit: Payer: Self-pay | Admitting: Cardiology

## 2024-06-25 ENCOUNTER — Other Ambulatory Visit: Payer: Self-pay | Admitting: Cardiology

## 2024-06-25 MED ORDER — VYNDAMAX 61 MG PO CAPS: 1.0000 | ORAL_CAPSULE | Freq: Every day | ORAL | 1 refills | Status: DC

## 2024-06-30 LAB — LAB REPORT - SCANNED: EGFR: 56

## 2024-07-07 ENCOUNTER — Other Ambulatory Visit: Payer: Self-pay | Admitting: Cardiology

## 2024-07-07 DIAGNOSIS — E7800 Pure hypercholesterolemia, unspecified: Secondary | ICD-10-CM

## 2024-07-07 NOTE — Telephone Encounter (Signed)
 Wife is calling about the the prescription refill. Please advise

## 2024-07-09 MED ORDER — ROSUVASTATIN CALCIUM 10 MG PO TABS: 10.0000 mg | ORAL_TABLET | Freq: Every day | ORAL | 1 refills | Status: AC

## 2024-07-10 ENCOUNTER — Other Ambulatory Visit: Payer: Self-pay

## 2024-07-10 MED ORDER — AMIODARONE HCL 200 MG PO TABS: 200.0000 mg | ORAL_TABLET | Freq: Every day | ORAL | 1 refills | Status: AC

## 2024-07-10 NOTE — Telephone Encounter (Signed)
 Pt's medication was sent to pt's pharmacy as requested. Confirmation received.

## 2024-07-25 NOTE — Progress Notes (Unsigned)
 HPI: Follow-up atrial fibrillation and amyloid. Patient with history of PAF. Underwent successful cardioversion February 13, 2022 on amiodarone .  Also with history of bradycardia requiring discontinuation of metoprolol  and Cardizem .  PET scan September 2023 showed concern for balanced ischemia but perfusion was normal, severe coronary calcification and flow was markedly abnormal suggestive of multivessel disease.  Ejection fraction 42% which decreased to 34% with exercise. Cardiac catheterization October 2023 showed 10% left main, 40% LAD and ejection fraction 50 to 55%; left ventricular end-diastolic pressure 24 mmHg. 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  Patient seen by oncology and underwent bone marrow biopsy which did not show multiple myeloma, amyloidosis or lymphoma.  He has been initiated on tafamidis .  Had syncopal episode December 2024 felt secondary to orthostasis.  Echocardiogram December 2024 showed ejection fraction 50 to 55%, mild left ventricular hypertrophy, grade 2 diastolic dysfunction, severe left atrial enlargement, mild right atrial enlargement, mild aortic insufficiency.  Since last seen,   Current Outpatient Medications  Medication Sig Dispense Refill   amiodarone  (PACERONE ) 200 MG tablet Take 1 tablet (200 mg total) by mouth daily. 90 tablet 1   apixaban  (ELIQUIS ) 5 MG TABS tablet Take 1 tablet (5 mg total) by mouth 2 (two) times daily. 180 tablet 1   cetirizine (ZYRTEC) 10 MG tablet Take 10 mg by mouth at bedtime.     cyanocobalamin  (VITAMIN B12) 1000 MCG tablet Take 1,000 mcg by mouth every other day.     FARXIGA  10 MG TABS tablet TAKE 1 TABLET(10 MG) BY MOUTH DAILY BEFORE BREAKFAST 90 tablet 3   Fluticasone  Furoate (ARNUITY ELLIPTA ) 100 MCG/ACT AEPB Inhale 1 puff into the lungs in the morning.     furosemide  (LASIX ) 20 MG tablet TAKE 2 TABLETS(40 MG) BY MOUTH DAILY 180 tablet 2   rosuvastatin  (CRESTOR ) 10 MG tablet Take 1 tablet (10 mg total) by  mouth daily. 90 tablet 1   spironolactone  (ALDACTONE ) 25 MG tablet Take 1 tablet (25 mg total) by mouth daily. 90 tablet 2   VYNDAMAX  61 MG CAPS TAKE 1 CAPSULE BY MOUTH DAILY 90 capsule 1   No current facility-administered medications for this visit.     Past Medical History:  Diagnosis Date   Arthritis    right knee oa   Asthma    Carpal tunnel syndrome    BILATERAL   Cholecystitis    Complication of anesthesia    adverse reaction to the spinal   Hypertension    PAF (paroxysmal atrial fibrillation) (HCC)    SBO (small bowel obstruction) (HCC) 12/2015    Past Surgical History:  Procedure Laterality Date   CARDIOVERSION N/A 02/14/2017   Procedure: CARDIOVERSION;  Surgeon: Delford Maude BROCKS, MD;  Location: [facility] ENDOSCOPY;  Service: Cardiovascular;  Laterality: N/A;   CARDIOVERSION N/A 12/11/2019   Procedure: CARDIOVERSION;  Surgeon: Pietro Redell RAMAN, MD;  Location: [facility] ENDOSCOPY;  Service: Cardiovascular;  Laterality: N/A;   CARDIOVERSION N/A 02/13/2022   Procedure: CARDIOVERSION;  Surgeon: Pietro Redell RAMAN, MD;  Location: [facility] ENDOSCOPY;  Service: Cardiovascular;  Laterality: N/A;   CHOLECYSTECTOMY  02/09/2017   CHOLECYSTECTOMY N/A 02/09/2017   Procedure: LAPAROSCOPIC CHOLECYSTECTOMY WITH INTRAOPERATIVE CHOLANGIOGRAM;  Surgeon: Belinda Cough, MD;  Location: [facility] OR;  Service: General;  Laterality: N/A;   COLONOSCOPY WITH PROPOFOL  N/A 11/09/2014   Procedure: COLONOSCOPY WITH PROPOFOL ;  Surgeon: Gladis MARLA Louder, MD;  Location: [facility] ENDOSCOPY;  Service: Endoscopy;  Laterality: N/A;   HERNIA REPAIR     2013  LEFT HEART CATH AND CORONARY ANGIOGRAPHY N/A 05/23/2022   Procedure: LEFT HEART CATH AND CORONARY ANGIOGRAPHY;  Surgeon: Jordan, Peter M, MD;  Location: [facility] INVASIVE CV LAB;  Service: Cardiovascular;  Laterality: N/A;   QUADRICEPS TENDON REPAIR Left 06/07/2015   Procedure: REPAIR LEFT QUADRICEP TENDON;  Surgeon: Dempsey Moan, MD;  Location: [facility] ORS;  Service: Orthopedics;  Laterality: Left;    TEE WITHOUT CARDIOVERSION N/A 02/14/2017   Procedure: TRANSESOPHAGEAL ECHOCARDIOGRAM (TEE);  Surgeon: Delford Maude BROCKS, MD;  Location: [facility] ENDOSCOPY;  Service: Cardiovascular;  Laterality: N/A;   TOTAL KNEE ARTHROPLASTY Right 12/27/2015   Procedure: RIGHT TOTAL KNEE ARTHROPLASTY;  Surgeon: Dempsey Moan, MD;  Location: [facility] ORS;  Service: Orthopedics;  Laterality: Right;    Social History   Socioeconomic History   Marital status: Married    Spouse name: Elveria   Number of children: 2   Years of education: Not on file   Highest education level: Master's degree (e.g., MA, MS, MEng, MEd, MSW, MBA)  Occupational History   Occupation: retired  Tobacco Use   Smoking status: Never   Smokeless tobacco: Never  Vaping Use   Vaping status: Never Used  Substance and Sexual Activity   Alcohol use: Yes    Comment: 1 beer daily   Drug use: No   Sexual activity: Not on file  Other Topics Concern   Not on file  Social History Narrative   Not on file   Social Drivers of Health   Financial Resource Strain: Low Risk  (06/06/2022)   Overall Financial Resource Strain (CARDIA)    Difficulty of Paying Living Expenses: Not hard at all  Food Insecurity: No Food Insecurity (08/13/2023)   Hunger Vital Sign    Worried About Running Out of Food in the Last Year: Never true    Ran Out of Food in the Last Year: Never true  Transportation Needs: No Transportation Needs (08/13/2023)   PRAPARE - Administrator, Civil Service (Medical): No    Lack of Transportation (Non-Medical): No  Physical Activity: Not on file  Stress: Not on file  Social Connections: Not on file  Intimate Partner Violence: Not At Risk (08/15/2022)   Humiliation, Afraid, Rape, and Kick questionnaire    Fear of Current or Ex-Partner: No    Emotionally Abused: No    Physically Abused: No    Sexually Abused: No    Family History  Problem Relation Age of Onset   Hypertension Father     ROS: no fevers or chills,  productive cough, hemoptysis, dysphasia, odynophagia, melena, hematochezia, dysuria, hematuria, rash, seizure activity, orthopnea, PND, pedal edema, claudication. Remaining systems are negative.  Physical Exam: Well-developed well-nourished in no acute distress.  Skin is warm and dry.  HEENT is normal.  Neck is supple.  Chest is clear to auscultation with normal expansion.  Cardiovascular exam is regular rate and rhythm.  Abdominal exam nontender or distended. No masses palpated. Extremities show no edema. neuro grossly intact  ECG- personally reviewed  A/P  1 amyloidosis-continue tafamidis .  2 paroxysmal atrial fibrillation-he is in sinus rhythm today.  Continue amiodarone  and apixaban  at present dose.  Note we previously discussed Watchman device after a previous fall.  3 chronic diastolic congestive heart failure-he appears to be euvolemic.  Continue diuretic and Farxiga  at present dose.  4 hypertension-blood pressure controlled.  He has had difficulties with orthostasis in the past so we have allowed his blood pressure to run higher.  5 hyperlipidemia-continue statin.  6  history of mild coronary disease-continue statin.  7 previous cardiomyopathy-LV function improved on most recent echocardiogram.   Redell Shallow, MD

## 2024-07-31 ENCOUNTER — Encounter: Payer: Self-pay | Admitting: Cardiology

## 2024-07-31 ENCOUNTER — Ambulatory Visit (HOSPITAL_COMMUNITY)
Admission: RE | Admit: 2024-07-31 | Discharge: 2024-07-31 | Disposition: A | Discharge status: Home / Self Care | Source: Ambulatory Visit | Attending: Cardiology | Admitting: Cardiology

## 2024-07-31 ENCOUNTER — Ambulatory Visit: Attending: Cardiology | Admitting: Cardiology

## 2024-07-31 VITALS — BP 115/73 | HR 74 | Ht 70.0 in | Wt 186.4 lb

## 2024-07-31 DIAGNOSIS — I10 Essential (primary) hypertension: Secondary | ICD-10-CM

## 2024-07-31 DIAGNOSIS — I480 Paroxysmal atrial fibrillation: Secondary | ICD-10-CM | POA: Insufficient documentation

## 2024-07-31 DIAGNOSIS — E854 Organ-limited amyloidosis: Secondary | ICD-10-CM

## 2024-07-31 DIAGNOSIS — I43 Cardiomyopathy in diseases classified elsewhere: Secondary | ICD-10-CM

## 2024-07-31 DIAGNOSIS — E7800 Pure hypercholesterolemia, unspecified: Secondary | ICD-10-CM | POA: Diagnosis not present

## 2024-07-31 DIAGNOSIS — I2510 Atherosclerotic heart disease of native coronary artery without angina pectoris: Secondary | ICD-10-CM | POA: Diagnosis not present

## 2024-07-31 DIAGNOSIS — I5032 Chronic diastolic (congestive) heart failure: Secondary | ICD-10-CM

## 2024-07-31 LAB — TSH: TSH: 1.36 u[IU]/mL (ref 0.450–4.500)

## 2024-07-31 NOTE — Patient Instructions (Signed)
° °  Testing/Procedures:  A chest x-ray takes a picture of the organs and structures inside the chest, including the heart, lungs, and blood vessels. This test can show several things, including, whether the heart is enlarges; whether fluid is building up in the lungs; and whether pacemaker / defibrillator leads are still in place. [address]  Your physician has requested that you have an echocardiogram. Echocardiography is a painless test that uses sound waves to create images of your heart. It provides your doctor with information about the size and shape of your heart and how well your hearts chambers and valves are working. This procedure takes approximately one hour. There are no restrictions for this procedure. Please do NOT wear cologne, perfume, aftershave, or lotions (deodorant is allowed). Please arrive 15 minutes prior to your appointment time.  Please note: We ask at that you not bring children with you during ultrasound (echo/ vascular) testing. Due to room size and safety concerns, children are not allowed in the ultrasound rooms during exams. Our front office staff cannot provide observation of children in our lobby area while testing is being conducted. An adult accompanying a patient to their appointment will only be allowed in the ultrasound room at the discretion of the ultrasound technician under special circumstances. We apologize for any inconvenience. [address]  Follow-Up: At [facility], you and your health needs are our priority.  As part of our continuing mission to provide you with exceptional heart care, our providers are all part of one team.  This team includes your primary Cardiologist (physician) and Advanced Practice Providers or APPs (Physician Assistants and Nurse Practitioners) who all work together to provide you with the care you need, when you need it.  Your next appointment:   6 month(s)  Provider:   Redell Shallow, MD

## 2024-08-01 ENCOUNTER — Ambulatory Visit: Payer: Self-pay | Admitting: Cardiology

## 2024-08-22 ENCOUNTER — Encounter: Payer: Self-pay | Admitting: Oncology

## 2024-08-31 ENCOUNTER — Other Ambulatory Visit: Payer: Self-pay | Admitting: Cardiology

## 2024-09-12 ENCOUNTER — Ambulatory Visit (HOSPITAL_COMMUNITY)
Admission: RE | Admit: 2024-09-12 | Discharge: 2024-09-12 | Disposition: A | Source: Ambulatory Visit | Attending: Cardiology | Admitting: Cardiology

## 2024-09-12 ENCOUNTER — Telehealth: Payer: Self-pay | Admitting: Cardiology

## 2024-09-12 DIAGNOSIS — I5032 Chronic diastolic (congestive) heart failure: Secondary | ICD-10-CM | POA: Insufficient documentation

## 2024-09-12 DIAGNOSIS — I10 Essential (primary) hypertension: Secondary | ICD-10-CM | POA: Diagnosis not present

## 2024-09-12 DIAGNOSIS — R55 Syncope and collapse: Secondary | ICD-10-CM

## 2024-09-12 DIAGNOSIS — I428 Other cardiomyopathies: Secondary | ICD-10-CM | POA: Diagnosis not present

## 2024-09-12 LAB — ECHOCARDIOGRAM COMPLETE
Area-P 1/2: 3.72 cm2
P 1/2 time: 158 ms
S' Lateral: 3.98 cm

## 2024-09-12 NOTE — Telephone Encounter (Signed)
 Pt c/o medication issue:  1. Name of Medication: VYNDAMAX  61 MG CAPS   2. How are you currently taking this medication (dosage and times per day)? As written  3. Are you having a reaction (difficulty breathing--STAT)? no  4. What is your medication issue?   Pt wife calling pt grant has expired and needs to be renewed. Please advise.

## 2024-09-15 ENCOUNTER — Telehealth: Payer: Self-pay | Admitting: Pharmacy Technician

## 2024-09-15 NOTE — Telephone Encounter (Signed)
 Lorrene approved and given to pharmacy and patient

## 2024-09-15 NOTE — Telephone Encounter (Signed)
" ° °  Patient Advocate Encounter   The patient was approved for a [employer] grant that will help cover the cost of vyndamax  Total amount awarded, 7500.  Effective: 08/26/24 - 08/25/25   APW:[id number] ERW:[id number] Hmnle:[id number] PI:[id number] [employer] ID: [id number]   Pharmacy provided with approval and processing information. Patient informed via mychart  "

## 2024-11-20 ENCOUNTER — Ambulatory Visit: Admitting: Oncology

## 2024-11-20 ENCOUNTER — Other Ambulatory Visit

## 2024-11-21 ENCOUNTER — Ambulatory Visit: Admitting: Oncology

## 2024-11-21 ENCOUNTER — Other Ambulatory Visit
